# Patient Record
Sex: Female | Born: 1945
Health system: Southern US, Community
[De-identification: ages and names within clinical notes are randomized; demographics above are authoritative.]

## PROBLEM LIST (undated history)

## (undated) DIAGNOSIS — R9389 Abnormal findings on diagnostic imaging of other specified body structures: Secondary | ICD-10-CM

## (undated) DIAGNOSIS — G2581 Restless legs syndrome: Secondary | ICD-10-CM

## (undated) DIAGNOSIS — E1122 Type 2 diabetes mellitus with diabetic chronic kidney disease: Secondary | ICD-10-CM

## (undated) DIAGNOSIS — Z6841 Body Mass Index (BMI) 40.0 and over, adult: Secondary | ICD-10-CM

## (undated) DIAGNOSIS — N183 Chronic kidney disease, stage 3 unspecified: Secondary | ICD-10-CM

## (undated) DIAGNOSIS — J9611 Chronic respiratory failure with hypoxia: Secondary | ICD-10-CM

## (undated) DIAGNOSIS — R7989 Other specified abnormal findings of blood chemistry: Secondary | ICD-10-CM

## (undated) DIAGNOSIS — G8929 Other chronic pain: Secondary | ICD-10-CM

## (undated) DIAGNOSIS — J449 Chronic obstructive pulmonary disease, unspecified: Secondary | ICD-10-CM

## (undated) DIAGNOSIS — R251 Tremor, unspecified: Secondary | ICD-10-CM

## (undated) DIAGNOSIS — K389 Disease of appendix, unspecified: Secondary | ICD-10-CM

## (undated) DIAGNOSIS — Z9181 History of falling: Secondary | ICD-10-CM

## (undated) DIAGNOSIS — E785 Hyperlipidemia, unspecified: Secondary | ICD-10-CM

## (undated) DIAGNOSIS — E119 Type 2 diabetes mellitus without complications: Secondary | ICD-10-CM

## (undated) DIAGNOSIS — D72829 Elevated white blood cell count, unspecified: Secondary | ICD-10-CM

## (undated) DIAGNOSIS — E669 Obesity, unspecified: Secondary | ICD-10-CM

## (undated) DIAGNOSIS — R1900 Intra-abdominal and pelvic swelling, mass and lump, unspecified site: Secondary | ICD-10-CM

## (undated) DIAGNOSIS — G4733 Obstructive sleep apnea (adult) (pediatric): Secondary | ICD-10-CM

## (undated) DIAGNOSIS — N63 Unspecified lump in unspecified breast: Secondary | ICD-10-CM

## (undated) DIAGNOSIS — M109 Gout, unspecified: Secondary | ICD-10-CM

## (undated) DIAGNOSIS — I10 Essential (primary) hypertension: Secondary | ICD-10-CM

## (undated) HISTORY — DX: Unspecified lump in unspecified breast: N63.0

## (undated) HISTORY — PX: KNEE SURGERY: SHX244

## (undated) HISTORY — DX: Chronic respiratory failure with hypoxia: J96.11

## (undated) HISTORY — DX: Other chronic pain: G89.29

## (undated) HISTORY — DX: Tremor, unspecified: R25.1

## (undated) HISTORY — DX: Hyperlipidemia, unspecified: E78.5

## (undated) HISTORY — DX: History of falling: Z91.81

## (undated) HISTORY — PX: DILATION AND CURETTAGE, DIAGNOSTIC / THERAPEUTIC: SUR384

## (undated) HISTORY — DX: Elevated white blood cell count, unspecified: D72.829

## (undated) HISTORY — DX: Disease of appendix, unspecified: K38.9

## (undated) HISTORY — DX: Other specified abnormal findings of blood chemistry: R79.89

## (undated) HISTORY — DX: Chronic kidney disease, stage 3 unspecified: N18.30

## (undated) HISTORY — DX: Restless legs syndrome: G25.81

## (undated) HISTORY — DX: Chronic obstructive pulmonary disease, unspecified: J44.9

## (undated) HISTORY — DX: Morbid (severe) obesity due to excess calories: E66.01

## (undated) HISTORY — DX: Intra-abdominal and pelvic swelling, mass and lump, unspecified site: R19.00

## (undated) HISTORY — DX: Abnormal findings on diagnostic imaging of other specified body structures: R93.89

## (undated) HISTORY — DX: Obstructive sleep apnea (adult) (pediatric): G47.33

## (undated) HISTORY — DX: Chronic kidney disease, stage 3 (moderate): N18.3

## (undated) HISTORY — DX: Type 2 diabetes mellitus with diabetic chronic kidney disease: E11.22

## (undated) HISTORY — DX: Body Mass Index (BMI) 40.0 and over, adult: Z684

---
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
## 1977-06-16 HISTORY — PX: TUBAL LIGATION: SHX77

## 2001-05-11 ENCOUNTER — Other Ambulatory Visit: Admission: RE | Admit: 2001-05-11 | Discharge: 2001-05-11 | Payer: Self-pay | Admitting: Obstetrics and Gynecology

## 2001-05-21 ENCOUNTER — Ambulatory Visit (HOSPITAL_COMMUNITY): Admission: RE | Admit: 2001-05-21 | Discharge: 2001-05-21 | Payer: Self-pay | Admitting: Obstetrics and Gynecology

## 2001-06-10 ENCOUNTER — Other Ambulatory Visit: Admission: RE | Admit: 2001-06-10 | Discharge: 2001-06-10 | Payer: Self-pay | Admitting: Obstetrics and Gynecology

## 2001-11-01 ENCOUNTER — Encounter: Payer: Self-pay | Admitting: Oral Surgery

## 2001-11-02 ENCOUNTER — Ambulatory Visit (HOSPITAL_COMMUNITY): Admission: RE | Admit: 2001-11-02 | Discharge: 2001-11-02 | Payer: Self-pay | Admitting: Oral Surgery

## 2002-02-21 ENCOUNTER — Encounter: Payer: Self-pay | Admitting: Cardiology

## 2002-02-21 ENCOUNTER — Ambulatory Visit (HOSPITAL_COMMUNITY): Admission: RE | Admit: 2002-02-21 | Discharge: 2002-02-21 | Payer: Self-pay | Admitting: Cardiology

## 2002-04-22 ENCOUNTER — Other Ambulatory Visit: Admission: RE | Admit: 2002-04-22 | Discharge: 2002-04-22 | Payer: Self-pay | Admitting: Obstetrics and Gynecology

## 2002-07-01 ENCOUNTER — Ambulatory Visit (HOSPITAL_COMMUNITY): Admission: RE | Admit: 2002-07-01 | Discharge: 2002-07-01 | Payer: Self-pay | Admitting: Obstetrics and Gynecology

## 2003-08-21 ENCOUNTER — Ambulatory Visit (HOSPITAL_COMMUNITY): Admission: RE | Admit: 2003-08-21 | Discharge: 2003-08-21 | Payer: Self-pay | Admitting: Cardiology

## 2003-09-01 ENCOUNTER — Ambulatory Visit (HOSPITAL_COMMUNITY): Admission: RE | Admit: 2003-09-01 | Discharge: 2003-09-01 | Payer: Self-pay | Admitting: Gastroenterology

## 2004-01-22 ENCOUNTER — Encounter: Admission: RE | Admit: 2004-01-22 | Discharge: 2004-01-22 | Payer: Self-pay | Admitting: Cardiology

## 2004-07-03 ENCOUNTER — Encounter: Admission: RE | Admit: 2004-07-03 | Discharge: 2004-07-03 | Payer: Self-pay | Admitting: Cardiology

## 2005-04-06 ENCOUNTER — Emergency Department (HOSPITAL_COMMUNITY): Admission: EM | Admit: 2005-04-06 | Discharge: 2005-04-06 | Payer: Self-pay | Admitting: Family Medicine

## 2007-06-07 ENCOUNTER — Encounter: Admission: RE | Admit: 2007-06-07 | Discharge: 2007-06-07 | Payer: Self-pay | Admitting: Cardiology

## 2008-08-08 ENCOUNTER — Encounter (INDEPENDENT_AMBULATORY_CARE_PROVIDER_SITE_OTHER): Payer: Self-pay | Admitting: *Deleted

## 2010-07-07 ENCOUNTER — Encounter: Payer: Self-pay | Admitting: Cardiology

## 2010-10-18 ENCOUNTER — Ambulatory Visit (HOSPITAL_BASED_OUTPATIENT_CLINIC_OR_DEPARTMENT_OTHER): Payer: Medicare Other | Attending: Cardiology

## 2010-10-18 DIAGNOSIS — G4733 Obstructive sleep apnea (adult) (pediatric): Secondary | ICD-10-CM | POA: Insufficient documentation

## 2010-10-19 DIAGNOSIS — G4733 Obstructive sleep apnea (adult) (pediatric): Secondary | ICD-10-CM

## 2010-10-21 NOTE — Procedures (Signed)
NAME:  Kimberly Howell, Kimberly Howell               ACCOUNT NO.:  [id number]  MEDICAL RECORD NO.:  [id number]          PATIENT TYPE:  OUT  LOCATION:  SLEEP CENTER                 FACILITY:  [facility]  PHYSICIAN:  Emma Schupp D. Annamaria Boots, MD, FCCP, [place] OF BIRTH:  1945-07-26  DATE OF STUDY:  10/18/2010                           NOCTURNAL POLYSOMNOGRAM  REFERRING PHYSICIAN:  Ardyth Gal. Spruill, M.D.  INDICATION FOR STUDY:  Hypersomnia with sleep apnea.  EPWORTH SLEEPINESS SCORE:  15/24.  BMI 56.7.  Weight 320 pounds.  Height 63 inches.  Neck 17 inches.  MEDICATIONS:  Home medications are charted and reviewed.  SLEEP ARCHITECTURE:  Total sleep time 137.5 minutes with sleep efficiency 35.2%.  Stage I was 18.5%, stage II 59.3%, stage III absent, REM 22.2% of total sleep time.  Sleep latency 129.5 minutes, REM latency 102 minutes, awake after sleep onset 111.5 minutes, arousal index 59.8.  BEDTIME MEDICATION:  None.  RESPIRATORY DATA:  Apnea/hypopnea index (AHI) 89.5 per hour.  A total of 205 events was scored including 177 obstructive apneas, 23 mixed apneas, 5 hypopneas.  Events were more common while non-supine.  REM AHI 92.5 per hour.  Because of delayed sleep onset, she could not meet protocol requirements for application of CPAP titration by split protocol on this study night.  OXYGEN DATA:  Moderate snoring with oxygen desaturation to a nadir of 76% and a mean oxygen saturation through the study of 93.1% on room air.  CARDIAC DATA:  Normal sinus rhythm.  MOVEMENT-PARASOMNIA:  No significant movement disturbance.  Bathroom x1.  IMPRESSIONS-RECOMMENDATIONS: 1. Sleep architecture was significant for delayed sleep onset until     nearly 1 a.m. and then spontaneously waking, unable to regain sleep     after 3:30 a.m. 2. Severe obstructive sleep apnea/hypopnea syndrome, AHI 89.5 per     hour.  Events were more common while non-supine, but seen in all     sleep positions.  Moderate snoring with oxygen  desaturation to a     nadir of 76% and a mean oxygen saturation through the study of     93.1% on room air. 3. Because of the delayed sleep onset, she did not meet the protocol     requirements for initiation of CPAP titration     by split protocol on this study night.  Recommend return for     dedicated CPAP titration study or evaluate for alternative     management as clinically appropriate.     Keldan Eplin D. Annamaria Boots, MD, [facility], FACP Diplomate, [employer] adviser of Sleep Medicine Electronically Signed    CDY/MEDQ  D:  10/19/2010 12:28:11  T:  10/20/2010 02:23:10  Job:  [id number]

## 2012-10-26 ENCOUNTER — Other Ambulatory Visit: Payer: Self-pay | Admitting: Cardiology

## 2012-10-26 DIAGNOSIS — Z1231 Encounter for screening mammogram for malignant neoplasm of breast: Secondary | ICD-10-CM

## 2012-11-30 ENCOUNTER — Ambulatory Visit: Payer: Medicare Other

## 2012-12-06 ENCOUNTER — Ambulatory Visit
Admission: RE | Admit: 2012-12-06 | Discharge: 2012-12-06 | Disposition: A | Discharge status: Home / Self Care | Payer: Medicare Other | Source: Ambulatory Visit | Attending: Cardiology | Admitting: Cardiology

## 2012-12-06 DIAGNOSIS — Z1231 Encounter for screening mammogram for malignant neoplasm of breast: Secondary | ICD-10-CM

## 2012-12-08 ENCOUNTER — Other Ambulatory Visit: Payer: Self-pay | Admitting: Cardiology

## 2012-12-08 DIAGNOSIS — R928 Other abnormal and inconclusive findings on diagnostic imaging of breast: Secondary | ICD-10-CM

## 2012-12-21 ENCOUNTER — Ambulatory Visit
Admission: RE | Admit: 2012-12-21 | Discharge: 2012-12-21 | Disposition: A | Discharge status: Home / Self Care | Payer: Medicare Other | Source: Ambulatory Visit | Attending: Cardiology | Admitting: Cardiology

## 2012-12-21 ENCOUNTER — Other Ambulatory Visit: Payer: Self-pay | Admitting: Cardiology

## 2012-12-21 DIAGNOSIS — R928 Other abnormal and inconclusive findings on diagnostic imaging of breast: Secondary | ICD-10-CM

## 2012-12-21 DIAGNOSIS — N6320 Unspecified lump in the left breast, unspecified quadrant: Secondary | ICD-10-CM

## 2013-01-03 ENCOUNTER — Other Ambulatory Visit: Payer: Medicare Other

## 2013-01-10 ENCOUNTER — Other Ambulatory Visit: Payer: Medicare Other

## 2013-01-11 ENCOUNTER — Ambulatory Visit
Admission: RE | Admit: 2013-01-11 | Discharge: 2013-01-11 | Disposition: A | Discharge status: Home / Self Care | Payer: Medicare Other | Source: Ambulatory Visit | Attending: Cardiology | Admitting: Cardiology

## 2013-01-11 ENCOUNTER — Other Ambulatory Visit: Payer: Self-pay | Admitting: Cardiology

## 2013-01-11 DIAGNOSIS — N6320 Unspecified lump in the left breast, unspecified quadrant: Secondary | ICD-10-CM

## 2013-06-30 ENCOUNTER — Encounter: Payer: Self-pay | Admitting: Gastroenterology

## 2014-01-06 ENCOUNTER — Encounter: Payer: Self-pay | Admitting: Gastroenterology

## 2014-02-27 ENCOUNTER — Other Ambulatory Visit: Payer: Self-pay

## 2014-02-27 DIAGNOSIS — Z1231 Encounter for screening mammogram for malignant neoplasm of breast: Secondary | ICD-10-CM

## 2014-03-13 ENCOUNTER — Ambulatory Visit: Payer: Medicare Other

## 2014-03-20 ENCOUNTER — Ambulatory Visit: Payer: Medicare Other

## 2014-04-04 ENCOUNTER — Encounter: Payer: Self-pay | Admitting: Gastroenterology

## 2014-04-17 ENCOUNTER — Ambulatory Visit: Payer: Medicare Other

## 2014-04-19 ENCOUNTER — Other Ambulatory Visit: Payer: Self-pay

## 2014-04-19 ENCOUNTER — Other Ambulatory Visit: Payer: Self-pay | Admitting: Cardiology

## 2014-04-19 ENCOUNTER — Ambulatory Visit
Admission: RE | Admit: 2014-04-19 | Discharge: 2014-04-19 | Disposition: A | Discharge status: Home / Self Care | Payer: Commercial Managed Care - HMO | Source: Ambulatory Visit

## 2014-04-19 DIAGNOSIS — Z1231 Encounter for screening mammogram for malignant neoplasm of breast: Secondary | ICD-10-CM

## 2014-10-23 ENCOUNTER — Other Ambulatory Visit: Payer: Self-pay | Admitting: Cardiology

## 2014-10-23 DIAGNOSIS — R51 Headache: Secondary | ICD-10-CM

## 2014-10-23 DIAGNOSIS — R42 Dizziness and giddiness: Secondary | ICD-10-CM

## 2014-10-23 DIAGNOSIS — R519 Headache, unspecified: Secondary | ICD-10-CM

## 2014-10-30 ENCOUNTER — Ambulatory Visit
Admission: RE | Admit: 2014-10-30 | Discharge: 2014-10-30 | Disposition: A | Discharge status: Home / Self Care | Payer: Commercial Managed Care - HMO | Source: Ambulatory Visit | Attending: Cardiology | Admitting: Cardiology

## 2014-10-30 ENCOUNTER — Other Ambulatory Visit: Payer: Self-pay | Admitting: Cardiology

## 2014-10-30 DIAGNOSIS — R42 Dizziness and giddiness: Secondary | ICD-10-CM

## 2014-10-30 DIAGNOSIS — R519 Headache, unspecified: Secondary | ICD-10-CM

## 2014-10-30 DIAGNOSIS — M25512 Pain in left shoulder: Secondary | ICD-10-CM

## 2014-10-30 DIAGNOSIS — R51 Headache: Secondary | ICD-10-CM

## 2016-04-04 ENCOUNTER — Ambulatory Visit (INDEPENDENT_AMBULATORY_CARE_PROVIDER_SITE_OTHER): Payer: Medicare HMO | Admitting: Orthopaedic Surgery

## 2016-04-04 DIAGNOSIS — M25511 Pain in right shoulder: Secondary | ICD-10-CM

## 2016-04-04 DIAGNOSIS — M25512 Pain in left shoulder: Secondary | ICD-10-CM | POA: Diagnosis not present

## 2016-04-25 ENCOUNTER — Telehealth (INDEPENDENT_AMBULATORY_CARE_PROVIDER_SITE_OTHER): Payer: Self-pay | Admitting: Orthopaedic Surgery

## 2016-04-25 NOTE — Telephone Encounter (Signed)
Patient called about a brace for her wrist. Please call patient back at work number [phone] and ask for Mrs. A.

## 2016-04-29 ENCOUNTER — Telehealth (INDEPENDENT_AMBULATORY_CARE_PROVIDER_SITE_OTHER): Payer: Self-pay | Admitting: Orthopaedic Surgery

## 2016-04-29 NOTE — Telephone Encounter (Signed)
Left VM for pt. SHe can go to [employer] for a better splint to help with her CPT Left wrist.

## 2016-04-29 NOTE — Telephone Encounter (Signed)
Patient called about brace for left wrist, she was told to call if she felt as though she needed it. Patient called last week and has not heard anything yet. Please call patient.

## 2016-04-29 NOTE — Telephone Encounter (Signed)
Patient would like the same brace for her left wrist that she has for her right wrist, patient states she received that brace here in the office

## 2016-05-01 NOTE — Telephone Encounter (Signed)
Pt came by and picked up her brace for the left hand.

## 2016-05-07 ENCOUNTER — Other Ambulatory Visit: Payer: Self-pay | Admitting: Nephrology

## 2016-05-07 DIAGNOSIS — E118 Type 2 diabetes mellitus with unspecified complications: Secondary | ICD-10-CM

## 2016-05-07 DIAGNOSIS — N183 Chronic kidney disease, stage 3 unspecified: Secondary | ICD-10-CM

## 2016-05-07 DIAGNOSIS — I159 Secondary hypertension, unspecified: Secondary | ICD-10-CM

## 2016-05-07 DIAGNOSIS — N179 Acute kidney failure, unspecified: Secondary | ICD-10-CM

## 2016-05-12 ENCOUNTER — Other Ambulatory Visit: Payer: Self-pay | Admitting: Cardiology

## 2016-05-12 ENCOUNTER — Other Ambulatory Visit: Payer: Self-pay | Admitting: Nephrology

## 2016-05-12 DIAGNOSIS — I159 Secondary hypertension, unspecified: Secondary | ICD-10-CM

## 2016-05-12 DIAGNOSIS — N183 Chronic kidney disease, stage 3 unspecified: Secondary | ICD-10-CM

## 2016-05-12 DIAGNOSIS — N179 Acute kidney failure, unspecified: Secondary | ICD-10-CM

## 2016-05-12 DIAGNOSIS — E118 Type 2 diabetes mellitus with unspecified complications: Secondary | ICD-10-CM

## 2016-05-12 DIAGNOSIS — Z1231 Encounter for screening mammogram for malignant neoplasm of breast: Secondary | ICD-10-CM

## 2016-05-19 ENCOUNTER — Ambulatory Visit
Admission: RE | Admit: 2016-05-19 | Discharge: 2016-05-19 | Disposition: A | Discharge status: Home / Self Care | Payer: Medicare HMO | Source: Ambulatory Visit | Attending: Cardiology | Admitting: Cardiology

## 2016-05-19 ENCOUNTER — Other Ambulatory Visit: Payer: Commercial Managed Care - HMO

## 2016-05-19 ENCOUNTER — Ambulatory Visit
Admission: RE | Admit: 2016-05-19 | Discharge: 2016-05-19 | Disposition: A | Discharge status: Home / Self Care | Payer: Medicare HMO | Source: Ambulatory Visit | Attending: Nephrology | Admitting: Nephrology

## 2016-05-19 DIAGNOSIS — N179 Acute kidney failure, unspecified: Secondary | ICD-10-CM

## 2016-05-19 DIAGNOSIS — E118 Type 2 diabetes mellitus with unspecified complications: Secondary | ICD-10-CM

## 2016-05-19 DIAGNOSIS — Z1231 Encounter for screening mammogram for malignant neoplasm of breast: Secondary | ICD-10-CM

## 2016-05-19 DIAGNOSIS — I159 Secondary hypertension, unspecified: Secondary | ICD-10-CM

## 2016-05-19 DIAGNOSIS — N183 Chronic kidney disease, stage 3 unspecified: Secondary | ICD-10-CM

## 2016-05-27 ENCOUNTER — Telehealth (INDEPENDENT_AMBULATORY_CARE_PROVIDER_SITE_OTHER): Payer: Self-pay | Admitting: Orthopaedic Surgery

## 2016-05-27 NOTE — Telephone Encounter (Signed)
Needs return to [facility] not seen in 2 months

## 2016-05-27 NOTE — Telephone Encounter (Signed)
Please advise 

## 2016-05-27 NOTE — Telephone Encounter (Signed)
Patient states the tylenol is no longer helping her shoulder pain. Patient is requesting something stronger for the pain. Please call patient

## 2016-05-29 NOTE — Telephone Encounter (Signed)
LM at work to make appt  See note below by PW

## 2016-11-17 DIAGNOSIS — E7800 Pure hypercholesterolemia, unspecified: Secondary | ICD-10-CM | POA: Diagnosis not present

## 2016-11-17 DIAGNOSIS — I10 Essential (primary) hypertension: Secondary | ICD-10-CM | POA: Diagnosis not present

## 2016-11-17 DIAGNOSIS — K029 Dental caries, unspecified: Secondary | ICD-10-CM | POA: Diagnosis not present

## 2016-11-17 DIAGNOSIS — E0822 Diabetes mellitus due to underlying condition with diabetic chronic kidney disease: Secondary | ICD-10-CM | POA: Diagnosis not present

## 2016-11-17 DIAGNOSIS — M109 Gout, unspecified: Secondary | ICD-10-CM | POA: Diagnosis not present

## 2016-11-17 DIAGNOSIS — E1165 Type 2 diabetes mellitus with hyperglycemia: Secondary | ICD-10-CM | POA: Diagnosis not present

## 2016-11-17 DIAGNOSIS — N183 Chronic kidney disease, stage 3 (moderate): Secondary | ICD-10-CM | POA: Diagnosis not present

## 2017-01-29 DIAGNOSIS — N183 Chronic kidney disease, stage 3 (moderate): Secondary | ICD-10-CM | POA: Diagnosis not present

## 2017-01-29 DIAGNOSIS — I10 Essential (primary) hypertension: Secondary | ICD-10-CM | POA: Diagnosis not present

## 2017-01-29 DIAGNOSIS — E1122 Type 2 diabetes mellitus with diabetic chronic kidney disease: Secondary | ICD-10-CM | POA: Diagnosis not present

## 2017-02-23 DIAGNOSIS — G894 Chronic pain syndrome: Secondary | ICD-10-CM | POA: Diagnosis not present

## 2017-02-23 DIAGNOSIS — E0822 Diabetes mellitus due to underlying condition with diabetic chronic kidney disease: Secondary | ICD-10-CM | POA: Diagnosis not present

## 2017-03-09 DIAGNOSIS — N183 Chronic kidney disease, stage 3 (moderate): Secondary | ICD-10-CM | POA: Diagnosis not present

## 2017-03-09 DIAGNOSIS — E119 Type 2 diabetes mellitus without complications: Secondary | ICD-10-CM | POA: Diagnosis not present

## 2017-03-09 DIAGNOSIS — I10 Essential (primary) hypertension: Secondary | ICD-10-CM | POA: Diagnosis not present

## 2017-03-09 DIAGNOSIS — M109 Gout, unspecified: Secondary | ICD-10-CM | POA: Diagnosis not present

## 2017-03-09 DIAGNOSIS — E669 Obesity, unspecified: Secondary | ICD-10-CM | POA: Diagnosis not present

## 2017-07-13 DIAGNOSIS — E7800 Pure hypercholesterolemia, unspecified: Secondary | ICD-10-CM | POA: Diagnosis not present

## 2017-07-13 DIAGNOSIS — E0822 Diabetes mellitus due to underlying condition with diabetic chronic kidney disease: Secondary | ICD-10-CM | POA: Diagnosis not present

## 2017-07-13 DIAGNOSIS — N183 Chronic kidney disease, stage 3 (moderate): Secondary | ICD-10-CM | POA: Diagnosis not present

## 2017-07-13 DIAGNOSIS — G894 Chronic pain syndrome: Secondary | ICD-10-CM | POA: Diagnosis not present

## 2017-07-13 DIAGNOSIS — R200 Anesthesia of skin: Secondary | ICD-10-CM | POA: Diagnosis not present

## 2017-07-13 DIAGNOSIS — I10 Essential (primary) hypertension: Secondary | ICD-10-CM | POA: Diagnosis not present

## 2017-08-03 DIAGNOSIS — E119 Type 2 diabetes mellitus without complications: Secondary | ICD-10-CM | POA: Diagnosis not present

## 2017-08-10 DIAGNOSIS — I129 Hypertensive chronic kidney disease with stage 1 through stage 4 chronic kidney disease, or unspecified chronic kidney disease: Secondary | ICD-10-CM | POA: Diagnosis not present

## 2017-08-10 DIAGNOSIS — M109 Gout, unspecified: Secondary | ICD-10-CM | POA: Diagnosis not present

## 2017-08-10 DIAGNOSIS — E669 Obesity, unspecified: Secondary | ICD-10-CM | POA: Diagnosis not present

## 2017-08-10 DIAGNOSIS — E1122 Type 2 diabetes mellitus with diabetic chronic kidney disease: Secondary | ICD-10-CM | POA: Diagnosis not present

## 2017-08-10 DIAGNOSIS — N183 Chronic kidney disease, stage 3 (moderate): Secondary | ICD-10-CM | POA: Diagnosis not present

## 2017-08-26 ENCOUNTER — Other Ambulatory Visit: Payer: Self-pay | Admitting: Internal Medicine

## 2017-08-26 ENCOUNTER — Ambulatory Visit
Admission: RE | Admit: 2017-08-26 | Discharge: 2017-08-26 | Disposition: A | Discharge status: Home / Self Care | Payer: Medicare HMO | Source: Ambulatory Visit | Attending: Internal Medicine | Admitting: Internal Medicine

## 2017-08-26 DIAGNOSIS — R040 Epistaxis: Secondary | ICD-10-CM | POA: Diagnosis not present

## 2017-08-26 DIAGNOSIS — M25561 Pain in right knee: Secondary | ICD-10-CM

## 2017-08-26 DIAGNOSIS — S52021A Displaced fracture of olecranon process without intraarticular extension of right ulna, initial encounter for closed fracture: Secondary | ICD-10-CM | POA: Diagnosis not present

## 2017-08-26 DIAGNOSIS — S8991XA Unspecified injury of right lower leg, initial encounter: Secondary | ICD-10-CM | POA: Diagnosis not present

## 2017-08-26 DIAGNOSIS — M25521 Pain in right elbow: Secondary | ICD-10-CM | POA: Diagnosis not present

## 2017-08-27 DIAGNOSIS — M25521 Pain in right elbow: Secondary | ICD-10-CM | POA: Diagnosis not present

## 2017-08-28 DIAGNOSIS — M25521 Pain in right elbow: Secondary | ICD-10-CM | POA: Diagnosis not present

## 2017-09-03 DIAGNOSIS — M25521 Pain in right elbow: Secondary | ICD-10-CM | POA: Diagnosis not present

## 2017-09-03 DIAGNOSIS — M79644 Pain in right finger(s): Secondary | ICD-10-CM | POA: Diagnosis not present

## 2017-09-15 DIAGNOSIS — M25521 Pain in right elbow: Secondary | ICD-10-CM | POA: Diagnosis not present

## 2017-10-05 DIAGNOSIS — M5442 Lumbago with sciatica, left side: Secondary | ICD-10-CM | POA: Diagnosis not present

## 2017-10-05 DIAGNOSIS — R252 Cramp and spasm: Secondary | ICD-10-CM | POA: Diagnosis not present

## 2017-10-13 DIAGNOSIS — M25521 Pain in right elbow: Secondary | ICD-10-CM | POA: Diagnosis not present

## 2017-10-28 DIAGNOSIS — M5442 Lumbago with sciatica, left side: Secondary | ICD-10-CM | POA: Diagnosis not present

## 2017-10-28 DIAGNOSIS — R252 Cramp and spasm: Secondary | ICD-10-CM | POA: Diagnosis not present

## 2017-11-03 DIAGNOSIS — G4733 Obstructive sleep apnea (adult) (pediatric): Secondary | ICD-10-CM | POA: Diagnosis not present

## 2017-11-03 DIAGNOSIS — M5442 Lumbago with sciatica, left side: Secondary | ICD-10-CM | POA: Diagnosis not present

## 2017-11-03 DIAGNOSIS — R252 Cramp and spasm: Secondary | ICD-10-CM | POA: Diagnosis not present

## 2017-12-04 DIAGNOSIS — G4733 Obstructive sleep apnea (adult) (pediatric): Secondary | ICD-10-CM | POA: Diagnosis not present

## 2017-12-04 DIAGNOSIS — M5442 Lumbago with sciatica, left side: Secondary | ICD-10-CM | POA: Diagnosis not present

## 2017-12-04 DIAGNOSIS — R252 Cramp and spasm: Secondary | ICD-10-CM | POA: Diagnosis not present

## 2018-01-03 DIAGNOSIS — G4733 Obstructive sleep apnea (adult) (pediatric): Secondary | ICD-10-CM | POA: Diagnosis not present

## 2018-01-03 DIAGNOSIS — M5442 Lumbago with sciatica, left side: Secondary | ICD-10-CM | POA: Diagnosis not present

## 2018-01-03 DIAGNOSIS — R252 Cramp and spasm: Secondary | ICD-10-CM | POA: Diagnosis not present

## 2018-01-11 DIAGNOSIS — G4730 Sleep apnea, unspecified: Secondary | ICD-10-CM | POA: Diagnosis not present

## 2018-01-11 DIAGNOSIS — E7800 Pure hypercholesterolemia, unspecified: Secondary | ICD-10-CM | POA: Diagnosis not present

## 2018-01-11 DIAGNOSIS — Z6841 Body Mass Index (BMI) 40.0 and over, adult: Secondary | ICD-10-CM | POA: Diagnosis not present

## 2018-01-11 DIAGNOSIS — Z1211 Encounter for screening for malignant neoplasm of colon: Secondary | ICD-10-CM | POA: Diagnosis not present

## 2018-01-11 DIAGNOSIS — I10 Essential (primary) hypertension: Secondary | ICD-10-CM | POA: Diagnosis not present

## 2018-01-11 DIAGNOSIS — N183 Chronic kidney disease, stage 3 (moderate): Secondary | ICD-10-CM | POA: Diagnosis not present

## 2018-01-11 DIAGNOSIS — E1122 Type 2 diabetes mellitus with diabetic chronic kidney disease: Secondary | ICD-10-CM | POA: Diagnosis not present

## 2018-01-12 DIAGNOSIS — Z1211 Encounter for screening for malignant neoplasm of colon: Secondary | ICD-10-CM | POA: Diagnosis not present

## 2018-02-03 DIAGNOSIS — R252 Cramp and spasm: Secondary | ICD-10-CM | POA: Diagnosis not present

## 2018-02-03 DIAGNOSIS — M5442 Lumbago with sciatica, left side: Secondary | ICD-10-CM | POA: Diagnosis not present

## 2018-02-03 DIAGNOSIS — G4733 Obstructive sleep apnea (adult) (pediatric): Secondary | ICD-10-CM | POA: Diagnosis not present

## 2018-02-22 DIAGNOSIS — I129 Hypertensive chronic kidney disease with stage 1 through stage 4 chronic kidney disease, or unspecified chronic kidney disease: Secondary | ICD-10-CM | POA: Diagnosis not present

## 2018-02-22 DIAGNOSIS — M109 Gout, unspecified: Secondary | ICD-10-CM | POA: Diagnosis not present

## 2018-02-22 DIAGNOSIS — E119 Type 2 diabetes mellitus without complications: Secondary | ICD-10-CM | POA: Diagnosis not present

## 2018-02-22 DIAGNOSIS — E1122 Type 2 diabetes mellitus with diabetic chronic kidney disease: Secondary | ICD-10-CM | POA: Diagnosis not present

## 2018-02-22 DIAGNOSIS — N183 Chronic kidney disease, stage 3 (moderate): Secondary | ICD-10-CM | POA: Diagnosis not present

## 2018-02-22 DIAGNOSIS — E669 Obesity, unspecified: Secondary | ICD-10-CM | POA: Diagnosis not present

## 2018-03-04 DIAGNOSIS — R05 Cough: Secondary | ICD-10-CM | POA: Diagnosis not present

## 2018-03-04 DIAGNOSIS — J209 Acute bronchitis, unspecified: Secondary | ICD-10-CM | POA: Diagnosis not present

## 2018-03-04 DIAGNOSIS — R0600 Dyspnea, unspecified: Secondary | ICD-10-CM | POA: Diagnosis not present

## 2018-03-06 DIAGNOSIS — R0609 Other forms of dyspnea: Secondary | ICD-10-CM | POA: Diagnosis not present

## 2018-03-06 DIAGNOSIS — G4733 Obstructive sleep apnea (adult) (pediatric): Secondary | ICD-10-CM | POA: Diagnosis not present

## 2018-03-06 DIAGNOSIS — M5442 Lumbago with sciatica, left side: Secondary | ICD-10-CM | POA: Diagnosis not present

## 2018-03-06 DIAGNOSIS — R252 Cramp and spasm: Secondary | ICD-10-CM | POA: Diagnosis not present

## 2018-03-06 DIAGNOSIS — R0601 Orthopnea: Secondary | ICD-10-CM | POA: Diagnosis not present

## 2018-03-06 DIAGNOSIS — J449 Chronic obstructive pulmonary disease, unspecified: Secondary | ICD-10-CM | POA: Diagnosis not present

## 2018-03-10 ENCOUNTER — Other Ambulatory Visit: Payer: Self-pay | Admitting: Internal Medicine

## 2018-03-10 ENCOUNTER — Other Ambulatory Visit: Payer: Self-pay

## 2018-03-10 ENCOUNTER — Ambulatory Visit
Admission: RE | Admit: 2018-03-10 | Discharge: 2018-03-10 | Disposition: A | Discharge status: Home / Self Care | Payer: Medicare HMO | Source: Ambulatory Visit | Attending: Internal Medicine | Admitting: Internal Medicine

## 2018-03-10 ENCOUNTER — Observation Stay (HOSPITAL_COMMUNITY)
Admission: EM | Admit: 2018-03-10 | Discharge: 2018-03-11 | Disposition: A | Discharge status: Home / Self Care | Payer: Medicare HMO | Attending: Internal Medicine | Admitting: Internal Medicine

## 2018-03-10 ENCOUNTER — Encounter (HOSPITAL_COMMUNITY): Payer: Self-pay

## 2018-03-10 DIAGNOSIS — R05 Cough: Secondary | ICD-10-CM | POA: Diagnosis not present

## 2018-03-10 DIAGNOSIS — N189 Chronic kidney disease, unspecified: Secondary | ICD-10-CM

## 2018-03-10 DIAGNOSIS — E1122 Type 2 diabetes mellitus with diabetic chronic kidney disease: Secondary | ICD-10-CM | POA: Insufficient documentation

## 2018-03-10 DIAGNOSIS — J9601 Acute respiratory failure with hypoxia: Secondary | ICD-10-CM | POA: Diagnosis not present

## 2018-03-10 DIAGNOSIS — D72829 Elevated white blood cell count, unspecified: Secondary | ICD-10-CM | POA: Insufficient documentation

## 2018-03-10 DIAGNOSIS — R7989 Other specified abnormal findings of blood chemistry: Secondary | ICD-10-CM

## 2018-03-10 DIAGNOSIS — M546 Pain in thoracic spine: Secondary | ICD-10-CM

## 2018-03-10 DIAGNOSIS — N179 Acute kidney failure, unspecified: Secondary | ICD-10-CM | POA: Diagnosis not present

## 2018-03-10 DIAGNOSIS — R0600 Dyspnea, unspecified: Secondary | ICD-10-CM | POA: Diagnosis not present

## 2018-03-10 DIAGNOSIS — M549 Dorsalgia, unspecified: Secondary | ICD-10-CM | POA: Insufficient documentation

## 2018-03-10 DIAGNOSIS — Z9981 Dependence on supplemental oxygen: Secondary | ICD-10-CM | POA: Diagnosis not present

## 2018-03-10 DIAGNOSIS — M109 Gout, unspecified: Secondary | ICD-10-CM | POA: Diagnosis not present

## 2018-03-10 DIAGNOSIS — R0601 Orthopnea: Secondary | ICD-10-CM | POA: Diagnosis not present

## 2018-03-10 DIAGNOSIS — E1169 Type 2 diabetes mellitus with other specified complication: Secondary | ICD-10-CM | POA: Diagnosis not present

## 2018-03-10 DIAGNOSIS — Z6841 Body Mass Index (BMI) 40.0 and over, adult: Secondary | ICD-10-CM | POA: Diagnosis not present

## 2018-03-10 DIAGNOSIS — Z7984 Long term (current) use of oral hypoglycemic drugs: Secondary | ICD-10-CM | POA: Insufficient documentation

## 2018-03-10 DIAGNOSIS — Z79899 Other long term (current) drug therapy: Secondary | ICD-10-CM | POA: Diagnosis not present

## 2018-03-10 DIAGNOSIS — J9621 Acute and chronic respiratory failure with hypoxia: Secondary | ICD-10-CM | POA: Diagnosis not present

## 2018-03-10 DIAGNOSIS — I129 Hypertensive chronic kidney disease with stage 1 through stage 4 chronic kidney disease, or unspecified chronic kidney disease: Secondary | ICD-10-CM | POA: Insufficient documentation

## 2018-03-10 DIAGNOSIS — Z7982 Long term (current) use of aspirin: Secondary | ICD-10-CM | POA: Insufficient documentation

## 2018-03-10 DIAGNOSIS — R0602 Shortness of breath: Secondary | ICD-10-CM

## 2018-03-10 DIAGNOSIS — E785 Hyperlipidemia, unspecified: Secondary | ICD-10-CM | POA: Diagnosis not present

## 2018-03-10 DIAGNOSIS — N183 Chronic kidney disease, stage 3 (moderate): Secondary | ICD-10-CM | POA: Diagnosis not present

## 2018-03-10 DIAGNOSIS — R791 Abnormal coagulation profile: Secondary | ICD-10-CM | POA: Diagnosis not present

## 2018-03-10 HISTORY — DX: Type 2 diabetes mellitus without complications: E11.9

## 2018-03-10 HISTORY — DX: Gout, unspecified: M10.9

## 2018-03-10 HISTORY — DX: Essential (primary) hypertension: I10

## 2018-03-10 LAB — PROTIME-INR
INR: 1.05
Prothrombin Time: 13.6 seconds (ref 11.4–15.2)

## 2018-03-10 LAB — BASIC METABOLIC PANEL
ANION GAP: 10 (ref 5–15)
BUN: 44 mg/dL — ABNORMAL HIGH (ref 8–23)
CALCIUM: 9.1 mg/dL (ref 8.9–10.3)
CO2: 28 mmol/L (ref 22–32)
CREATININE: 2.73 mg/dL — AB (ref 0.44–1.00)
Chloride: 102 mmol/L (ref 98–111)
GFR calc non Af Amer: 17 mL/min — ABNORMAL LOW (ref >60)
GFR, EST AFRICAN AMERICAN: 19 mL/min — AB (ref >60)
GLUCOSE: 135 mg/dL — AB (ref 70–99)
Potassium: 4.9 mmol/L (ref 3.5–5.1)
SODIUM: 140 mmol/L (ref 135–145)

## 2018-03-10 LAB — CBC WITH DIFFERENTIAL/PLATELET
BASOS ABS: 0 10*3/uL (ref 0.0–0.1)
BASOS PCT: 0 %
EOS ABS: 0.2 10*3/uL (ref 0.0–0.7)
EOS PCT: 2 %
HEMATOCRIT: 39 % (ref 36.0–46.0)
HEMOGLOBIN: 11.9 g/dL — AB (ref 12.0–15.0)
Lymphocytes Relative: 14 %
Lymphs Abs: 1.5 10*3/uL (ref 0.7–4.0)
MCH: 29.5 pg (ref 26.0–34.0)
MCHC: 30.5 g/dL (ref 30.0–36.0)
MCV: 96.8 fL (ref 78.0–100.0)
MONO ABS: 0.5 10*3/uL (ref 0.1–1.0)
Monocytes Relative: 5 %
Neutro Abs: 8.8 10*3/uL — ABNORMAL HIGH (ref 1.7–7.7)
Neutrophils Relative %: 79 %
Platelets: 243 10*3/uL (ref 150–400)
RBC: 4.03 MIL/uL (ref 3.87–5.11)
RDW: 13.6 % (ref 11.5–15.5)
WBC: 11.1 10*3/uL — AB (ref 4.0–10.5)

## 2018-03-10 LAB — TROPONIN I

## 2018-03-10 LAB — MAGNESIUM: MAGNESIUM: 2.3 mg/dL (ref 1.7–2.4)

## 2018-03-10 LAB — APTT: APTT: 33 s (ref 24–36)

## 2018-03-10 MED ORDER — SODIUM CHLORIDE 0.9% FLUSH
3.0000 mL | Freq: Two times a day (BID) | INTRAVENOUS | Status: DC
  Administered 2018-03-11: 3 mL via INTRAVENOUS

## 2018-03-10 MED ORDER — PRAVASTATIN SODIUM 40 MG PO TABS
40.0000 mg | ORAL_TABLET | Freq: Every day | ORAL | Status: DC
  Filled 2018-03-10: qty 1

## 2018-03-10 MED ORDER — ONDANSETRON HCL 4 MG PO TABS: 4.0000 mg | ORAL_TABLET | Freq: Four times a day (QID) | ORAL | Status: DC | PRN

## 2018-03-10 MED ORDER — INSULIN ASPART 100 UNIT/ML ~~LOC~~ SOLN: 0.0000 [IU] | Freq: Every day | SUBCUTANEOUS | Status: DC

## 2018-03-10 MED ORDER — HEPARIN BOLUS VIA INFUSION
5000.0000 [IU] | Freq: Once | INTRAVENOUS | Status: AC
  Administered 2018-03-10: 5000 [IU] via INTRAVENOUS
  Filled 2018-03-10: qty 5000

## 2018-03-10 MED ORDER — ALBUTEROL SULFATE (2.5 MG/3ML) 0.083% IN NEBU: 2.5000 mg | INHALATION_SOLUTION | RESPIRATORY_TRACT | Status: DC | PRN

## 2018-03-10 MED ORDER — SODIUM CHLORIDE 0.9 % IV SOLN
INTRAVENOUS | Status: DC
  Administered 2018-03-11: 07:00:00 via INTRAVENOUS

## 2018-03-10 MED ORDER — HEPARIN (PORCINE) IN NACL 100-0.45 UNIT/ML-% IJ SOLN
1400.0000 [IU]/h | INTRAMUSCULAR | Status: DC
  Administered 2018-03-10: 1400 [IU]/h via INTRAVENOUS
  Filled 2018-03-10 (×2): qty 250

## 2018-03-10 MED ORDER — ONDANSETRON HCL 4 MG/2ML IJ SOLN: 4.0000 mg | Freq: Four times a day (QID) | INTRAMUSCULAR | Status: DC | PRN

## 2018-03-10 MED ORDER — PANTOPRAZOLE SODIUM 40 MG PO TBEC
40.0000 mg | DELAYED_RELEASE_TABLET | Freq: Every day | ORAL | Status: DC
  Administered 2018-03-11: 40 mg via ORAL
  Filled 2018-03-10: qty 1

## 2018-03-10 MED ORDER — INSULIN ASPART 100 UNIT/ML ~~LOC~~ SOLN: 0.0000 [IU] | Freq: Three times a day (TID) | SUBCUTANEOUS | Status: DC

## 2018-03-10 MED ORDER — AMLODIPINE BESYLATE 5 MG PO TABS
2.5000 mg | ORAL_TABLET | Freq: Every day | ORAL | Status: DC
  Filled 2018-03-10: qty 1

## 2018-03-10 MED ORDER — ACETAMINOPHEN 650 MG RE SUPP: 650.0000 mg | Freq: Four times a day (QID) | RECTAL | Status: DC | PRN

## 2018-03-10 MED ORDER — CARVEDILOL 25 MG PO TABS
25.0000 mg | ORAL_TABLET | Freq: Two times a day (BID) | ORAL | Status: DC
  Administered 2018-03-11: 25 mg via ORAL
  Filled 2018-03-10 (×3): qty 1

## 2018-03-10 MED ORDER — ACETAMINOPHEN 325 MG PO TABS
650.0000 mg | ORAL_TABLET | Freq: Four times a day (QID) | ORAL | Status: DC | PRN
  Filled 2018-03-10: qty 2

## 2018-03-10 NOTE — ED Notes (Signed)
Pt sat in chair at bedside. Moved back into stretcher with staff assistance

## 2018-03-10 NOTE — Progress Notes (Signed)
Pt refused ABG. RT notified the doctor. ABG was canceled

## 2018-03-10 NOTE — ED Provider Notes (Signed)
[place] DEPT Provider Note   CSN: [id number] Arrival date & time: 03/10/18  1612     History   Chief Complaint Chief Complaint  Patient presents with  . Abnormal Lab  . Back Pain    HPI Kimberly Howell is a 72 y.o. female.  72yo female sent by PCP, arrives with her son and sister. Patient with history of stage 3 CKD, non insulin dependent diabetes, HTN, morbid obesity. Seen by PCP today for upper back pain, orthopnea, mild edema lower extremities.  Patient reports cough with shortness of breath and right upper back pain x1/2 weeks, was seen at her PCP 1 week ago for cough, diagnosed with bronchitis and given an albuterol inhaler, Tessalon, Zithromax.  Patient followed up today due to ongoing shortness of breath and pain in the right side of her back.  Patient reports shortness of breath with exertion which is normal for her however has been having shortness of breath when lying supine as well as pain in her right upper back with lying supine for the past week and a half.  PCP sent off labs including a BNP and a d-dimer, the BNP was within normal limits, d-dimer was elevated and patient was sent to the emergency room for a VQ scan (unable to CT scan due to her chronic kidney disease).  Kimberly Howell was also found to have an O2 sat of 88% in her PCP office and was started on home O2 therapy.  Patient states the pain in her right upper back radiates through to the right side of her chest at times when it "gets to be severe."     History reviewed. No pertinent past medical history.  There are no active problems to display for this patient.   OB History   None      Home Medications    Prior to Admission medications   Medication Sig Start Date End Date Taking? Authorizing Provider  amLODipine (NORVASC) 2.5 MG tablet Take 2.5 mg by mouth daily. 12/26/17  Yes [provider]  ASPIRIN 81 PO Take 81 mg by mouth daily. 12/22/17  Yes [provider]  benzonatate (TESSALON) 100 MG capsule Take 100 mg by mouth 3 (three) times daily as needed for cough. 03/05/18  Yes [provider]  carvedilol (COREG) 25 MG tablet Take 25 mg by mouth 2 (two) times daily. 12/19/17  Yes [provider]  COLCRYS 0.6 MG tablet Take 0.6 mg by mouth daily. 12/26/17  Yes [provider]  esomeprazole (NEXIUM) 40 MG capsule Take 40 mg by mouth daily. 12/30/17  Yes [provider]  Febuxostat 80 MG TABS Take 40 mg by mouth daily. 02/10/18  Yes [provider]  furosemide (LASIX) 20 MG tablet Take 20 mg by mouth daily. 03/02/18  Yes [provider]  glimepiride (AMARYL) 2 MG tablet Take 2 mg by mouth daily. 01/15/18  Yes [provider]  HYDROcodone-acetaminophen (NORCO) 10-325 MG tablet Take 1 tablet by mouth daily as needed for pain. 03/09/18  Yes [provider]  IRON PO Take 1 tablet by mouth daily. 12/22/17  Yes [provider]  JANUVIA 100 MG tablet Take 100 mg by mouth daily. 02/06/18  Yes [provider]  lisinopril (PRINIVIL,ZESTRIL) 40 MG tablet Take 40 mg by mouth daily. 01/22/18  Yes [provider]  meclizine (ANTIVERT) 25 MG tablet Take 25 mg by mouth daily. 12/19/17  Yes [provider]  methocarbamol (ROBAXIN) 750 MG tablet  Take 750 mg by mouth every 8 (eight) hours as needed for muscle spasms. 02/16/18  Yes [provider]  Multiple Vitamin (MULTIVITAMIN WITH MINERALS) TABS tablet Take 1 tablet by mouth daily. 12/22/17  Yes [provider]  pravastatin (PRAVACHOL) 40 MG tablet Take 40 mg by mouth daily. 12/26/17  Yes [provider]  traMADol (ULTRAM) 50 MG tablet Take 50-100 mg by mouth daily.  03/01/18  Yes [provider]  VENTOLIN HFA 108 (90 Base) MCG/ACT inhaler Inhale 2 puffs into the lungs every 6 (six) hours as needed. 03/05/18  Yes [provider]  VOLTAREN 1 % GEL Apply 2 g topically every 6 (six) hours. 12/22/17   Yes [provider]  ZYRTEC ALLERGY 10 MG tablet Take 10 mg by mouth daily. 12/22/17  Yes [provider]    Family History History reviewed. No pertinent family history.  Social History Social History   Tobacco Use  . Smoking status: Not on file  Substance Use Topics  . Alcohol use: Not on file  . Drug use: Not on file     Allergies   Patient has no known allergies.   Review of Systems Review of Systems  Constitutional: Negative for chills, diaphoresis and fever.  Respiratory: Positive for cough and shortness of breath. Negative for chest tightness.   Cardiovascular: Positive for chest pain.  Gastrointestinal: Negative for abdominal pain, constipation, diarrhea, nausea and vomiting.  Genitourinary: Negative for difficulty urinating, frequency and urgency.  Musculoskeletal: Positive for back pain.  Skin: Negative for rash and wound.  Allergic/Immunologic: Positive for immunocompromised state.  Neurological: Negative for dizziness and weakness.  Hematological: Does not bruise/bleed easily.  Psychiatric/Behavioral: Negative for confusion.  All other systems reviewed and are negative.    Physical Exam Updated Vital Signs BP (!) 142/75   Pulse 84   Temp 98.1 F (36.7 C) (Oral)   Resp 16   Ht 5\' 1"  (1.549 m)   Wt 135.4 kg   SpO2 96%   BMI 56.40 kg/m   Physical Exam  Constitutional: Kimberly Howell is oriented to person, place, and time. Kimberly Howell appears well-developed and well-nourished. No distress.  HENT:  Head: Normocephalic and atraumatic.  Eyes: Conjunctivae are normal.  Cardiovascular: Normal rate, regular rhythm, normal heart sounds and intact distal pulses.  No murmur heard. Pulmonary/Chest: Effort normal and breath sounds normal. No respiratory distress. Kimberly Howell has no wheezes. Kimberly Howell exhibits no tenderness.  Abdominal: Soft. Kimberly Howell exhibits no distension. There is no tenderness.  Musculoskeletal: Kimberly Howell exhibits tenderness. Kimberly Howell exhibits no deformity.        Back:  Neurological: Kimberly Howell is alert and oriented to person, place, and time.  Skin: Skin is warm and dry. Kimberly Howell is not diaphoretic.  Psychiatric: Kimberly Howell has a normal mood and affect. Her behavior is normal.  Nursing note and vitals reviewed.    ED Treatments / Results  Labs (all labs ordered are listed, but only abnormal results are displayed) Labs Reviewed  CBC WITH DIFFERENTIAL/PLATELET - Abnormal; Notable for the following components:      Result Value   WBC 11.1 (*)    Hemoglobin 11.9 (*)    Neutro Abs 8.8 (*)    All other components within normal limits  BASIC METABOLIC PANEL - Abnormal; Notable for the following components:   Glucose, Bld 135 (*)    BUN 44 (*)    Creatinine, Ser 2.73 (*)    GFR calc non Af Amer 17 (*)    GFR calc Af Amer 19 (*)  All other components within normal limits  TROPONIN I  MAGNESIUM  PROTIME-INR  APTT    EKG EKG Interpretation  Date/Time:  Wednesday March 10 2018 18:34:43 EDT Ventricular Rate:  85 PR Interval:    QRS Duration: 86 QT Interval:  368 QTC Calculation: 438 R Axis:   4 Text Interpretation:  Sinus rhythm Low voltage, precordial leads No old tracing to compare Non-specific TW changes in aVL, III Otherwise no significant change Confirmed by Duffy Bruce [phone]) on 03/10/2018 6:44:18 PM   Radiology Dg Chest 2 View  Result Date: 03/10/2018 CLINICAL DATA:  Posterior chest pain with cough and congestion for 1 week EXAM: CHEST - 2 VIEW COMPARISON:  07/03/2004 FINDINGS: The heart size and mediastinal contours are within normal limits. Both lungs are clear. The visualized skeletal structures are unremarkable. IMPRESSION: No active cardiopulmonary disease. Electronically Signed   By: Kathreen Devoid   On: 03/10/2018 11:39    Procedures Procedures (including critical care time)  Medications Ordered in ED Medications - No data to display   Initial Impression / Assessment and Plan / ED Course  I have reviewed the triage vital  signs and the nursing notes.  Pertinent labs & imaging results that were available during my care of the patient were reviewed by me and considered in my medical decision making (see chart for details).  Clinical Course as of Mar 10 2134  Wed Mar 10, 2018  2058 72yo female brought in by family for elevated d-dimer (1.8) at PCP office today. Patient was seen in follow up for cough/SHOB/dyspnea/right upper back pain x 1.5 weeks (originally thought to be bronchitis and treated with inhaler, zpack, tessalon), BNP WNL at PCP office, elevated baseline Cr due to history of stage 3 CKD. Patient also found to be hypoxic in PCP office with O2 88%, started on home O2 and sent to the Er, triage O2 88%. While in the Er, patient has maintained O2 sats greater than 90% on room air, states Kimberly Howell is breathing deep to try and keep the number on the monitor up which may be why Kimberly Howell is tachypneic at times. Due to her CKD, unable to CTA r/o Pe, unable to obtain a VQ scan at this hour, unable to give Lovenox due to her GFR. Discussed with Dr. Ellender Hose, ER attending who has seen the patient, agrees with plan to admit for monitoring and VQ scan tomorrow. Patient is agreeable with plan, does not want any further needle sticks (has IV in place).    [LM]  2105 EXAM: CHEST - 2 VIEW  COMPARISON:  07/03/2004  FINDINGS: The heart size and mediastinal contours are within normal limits. Both lungs are clear. The visualized skeletal structures are unremarkable.  IMPRESSION: No active cardiopulmonary disease.   Electronically Signed   By: Kathreen Devoid   On: 03/10/2018 11:39   [LM]  2134 Discussed with Dr. Tamala Julian, [facility], will admit, requests heparin, coags.    [LM]    Clinical Course User Index [LM] Tacy Learn, PA-C   Final Clinical Impressions(s) / ED Diagnoses   Final diagnoses:  Shortness of breath  Elevated d-dimer    ED Discharge Orders    None       Roque Lias 03/10/18  2135    Duffy Bruce, MD 03/11/18 1429

## 2018-03-10 NOTE — Progress Notes (Signed)
ANTICOAGULATION CONSULT NOTE - Initial Consult  Pharmacy Consult for Heparin Indication: presumed PE  No Known Allergies  Patient Measurements: Height: 5\' 1"  (154.9 cm) Weight: 298 lb 8 oz (135.4 kg) IBW/kg (Calculated) : 47.8 Heparin Dosing Weight: 82 kg  Vital Signs: Temp: 98.1 F (36.7 C) (09/25 1635) Temp Source: Oral (09/25 1635) BP: 142/75 (09/25 2104) Pulse Rate: 84 (09/25 2104)  Labs: Recent Labs    03/10/18 1819  HGB 11.9*  HCT 39.0  PLT 243  CREATININE 2.73*  TROPONINI <0.03    Estimated Creatinine Clearance: 25.4 mL/min (A) (by C-G formula based on SCr of 2.73 mg/dL (H)).   Medical History: History reviewed. No pertinent past medical history.  Medications:   Assessment: 72 y/o F with a h/o CKD recently treated for bronchitis with ongoing SOB, back pain fount to have elevated d-dimer at PCP office. VQ scan not possible due to CKD. Heparin initiated for presumed PE.   Goal of Therapy:  Heparin level 0.3-0.7 units/ml Monitor platelets by anticoagulation protocol: Yes   Plan:  Give 5000 units bolus x 1 Start heparin infusion at 1400 units/hr Check anti-Xa level in 6 hours and daily while on heparin Continue to monitor H&H and platelets  Ulice Dash D 03/10/2018,9:44 PM

## 2018-03-10 NOTE — ED Triage Notes (Signed)
Pt reports that her d dimer was elevated at her doctors office today at 1.88. She was sent over for evaluation of a PE. She is complaining of back pain. O2 88% and ordered to start home O2 today. A&Ox4.

## 2018-03-10 NOTE — ED Notes (Signed)
Per DJ, before ambulating pt, pt's O2 was 96% without oxygen supplementation. Upon ambulating, O2 dropped 88%. When pt sat back down in chair, O2 increased to 94%.

## 2018-03-10 NOTE — H&P (Signed)
History and Physical    Kimberly Howell YOV:[id number] DOB: 1945/07/18 DOA: 03/10/2018  Referring MD/NP/PA: Suella Broad, PA-C PCP: Kimberly Carol, MD  Patient coming from: Home  Chief Complaint: Elevated d-dimer  I have personally briefly reviewed patient's old medical records in [place]   HPI: Kimberly Howell is a 72 y.o. female with medical history significant of HTN, HLD, DM type II, CKD stage III, and morbid obesity; who was sent for elevated d-dimer.  Patient gone to her primary care office 6 days ago with complaints of a sharp right upper back pain and productive cough with yellowish sputum production.  At that time she was diagnosed with bronchitis treated with Zithromax, albuterol inhaler, and Tessalon Perles.  She reports that she continued to have a cough, but sputum changed to clear color.  The pain in her upper back persisted and reports that it would radiate to her chest.  Associated symptoms include do  shortness of breath, intermittent wheezing, orthopnea, and chronically has lower extremity swelling that is unchanged.  She followed back up today with her primary care provider due to symptoms.  Labs were sent off and including BNP and d-dimer.  D-dimer came back elevated at 1.08 and BNP was within normal limits.  At the office patient was noted to have O2 saturations of 88% and was started on oxygen which she is not normally on at baseline.   ED Course: Upon admission into the emergency department patient was noted to be afebrile, pulse 84-96, respiration 15-29, O2 saturations as low as 88% on room air, and improved to 97% with 2 L nasal cannula oxygen.  Labs were significant for WBC 11.1, hemoglobin 11.9, BUN 44, creatinine 2.73.  Chest x-ray was clear for any acute abnormalities.  Patient was started on a heparin drip and a VQ scan was ordered although will not be able to be obtained until in a.m.  TRH called to admit for suspected PE.  Review of Systems    Constitutional: Positive for malaise/fatigue. Negative for chills and fever.  HENT: Negative for congestion and nosebleeds.   Eyes: Negative for double vision and photophobia.  Respiratory: Positive for cough, sputum production, shortness of breath and wheezing.   Cardiovascular: Positive for chest pain and leg swelling.  Gastrointestinal: Negative for abdominal pain, diarrhea, nausea and vomiting.  Genitourinary: Negative for dysuria and frequency.  Musculoskeletal: Positive for back pain. Negative for falls and myalgias.  Neurological: Positive for weakness. Negative for focal weakness and loss of consciousness.  Psychiatric/Behavioral: Negative for substance abuse. The patient has insomnia.     Past Medical History:  Diagnosis Date  . Gout   . Gout   . HLD (hyperlipidemia)   . HTN (hypertension)   . Morbid obesity ([place])   . Non-insulin dependent type 2 diabetes mellitus ([place])     History reviewed. No pertinent surgical history.   reports that she has never smoked. She does not have any smokeless tobacco history on file. She reports that she drank alcohol. She reports that she has current or past drug history.  No Known Allergies  Family History  Problem Relation Age of Onset  . Lung cancer Father   . Prostate cancer Father     Prior to Admission medications   Medication Sig Start Date End Date Taking? Authorizing Provider  amLODipine (NORVASC) 2.5 MG tablet Take 2.5 mg by mouth daily. 12/26/17  Yes [provider]  ASPIRIN 81 PO Take 81 mg by mouth daily. 12/22/17  Yes [provider]  benzonatate (TESSALON) 100 MG capsule Take 100 mg by mouth 3 (three) times daily as needed for cough. 03/05/18  Yes [provider]  carvedilol (COREG) 25 MG tablet Take 25 mg by mouth 2 (two) times daily. 12/19/17  Yes [provider]  COLCRYS 0.6 MG tablet Take 0.6 mg by mouth daily. 12/26/17  Yes [provider]  esomeprazole (NEXIUM) 40 MG capsule  Take 40 mg by mouth daily. 12/30/17  Yes [provider]  Febuxostat 80 MG TABS Take 40 mg by mouth daily. 02/10/18  Yes [provider]  furosemide (LASIX) 20 MG tablet Take 20 mg by mouth daily. 03/02/18  Yes [provider]  glimepiride (AMARYL) 2 MG tablet Take 2 mg by mouth daily. 01/15/18  Yes [provider]  HYDROcodone-acetaminophen (NORCO) 10-325 MG tablet Take 1 tablet by mouth daily as needed for pain. 03/09/18  Yes [provider]  IRON PO Take 1 tablet by mouth daily. 12/22/17  Yes [provider]  JANUVIA 100 MG tablet Take 100 mg by mouth daily. 02/06/18  Yes [provider]  lisinopril (PRINIVIL,ZESTRIL) 40 MG tablet Take 40 mg by mouth daily. 01/22/18  Yes [provider]  meclizine (ANTIVERT) 25 MG tablet Take 25 mg by mouth daily. 12/19/17  Yes [provider]  methocarbamol (ROBAXIN) 750 MG tablet Take 750 mg by mouth every 8 (eight) hours as needed for muscle spasms. 02/16/18  Yes [provider]  Multiple Vitamin (MULTIVITAMIN WITH MINERALS) TABS tablet Take 1 tablet by mouth daily. 12/22/17  Yes [provider]  pravastatin (PRAVACHOL) 40 MG tablet Take 40 mg by mouth daily. 12/26/17  Yes [provider]  traMADol (ULTRAM) 50 MG tablet Take 50-100 mg by mouth daily.  03/01/18  Yes [provider]  VENTOLIN HFA 108 (90 Base) MCG/ACT inhaler Inhale 2 puffs into the lungs every 6 (six) hours as needed. 03/05/18  Yes [provider]  VOLTAREN 1 % GEL Apply 2 g topically every 6 (six) hours. 12/22/17  Yes [provider]  ZYRTEC ALLERGY 10 MG tablet Take 10 mg by mouth daily. 12/22/17  Yes [provider]    Physical Exam:  Constitutional: Morbidly obese female who appears to be in some discomfort Vitals:   03/10/18 2047 03/10/18 2104 03/10/18 2130 03/10/18 2200  BP: (!) 154/93 (!) 142/75 (!) 131/119 139/86  Pulse: 88 84 89 96  Resp: 15 16 20 16   Temp:       TempSrc:      SpO2: 91% 96% 94% 97%  Weight:      Height:       Eyes: PERRL, lids and conjunctivae normal ENMT: Mucous membranes are moist. Posterior pharynx clear of any exudate or lesions.Normal dentition.  Neck: normal, supple, no masses, no thyromegaly Respiratory: clear to auscultation bilaterally, no wheezing, no crackles. Normal respiratory effort. No accessory muscle use.   Cardiovascular: Regular rate and rhythm, no murmurs / rubs / gallops. No extremity edema. 2+ pedal pulses. No carotid bruits.  Abdomen: no tenderness, no masses palpated. No hepatosplenomegaly. Bowel sounds positive.  Musculoskeletal: no clubbing / cyanosis. No joint deformity upper and lower extremities. Good ROM, no contractures. Normal muscle tone.  Skin: no rashes, lesions, ulcers. No induration Neurologic: CN 2-12 grossly intact. Sensation intact, DTR normal. Strength 5/5 in all 4.  Psychiatric: Normal judgment and insight. Alert and oriented x 3. Normal mood.     Labs on Admission: I have personally  reviewed following labs and imaging studies  CBC: Recent Labs  Lab 03/10/18 1819  WBC 11.1*  NEUTROABS 8.8*  HGB 11.9*  HCT 39.0  MCV 96.8  PLT 025   Basic Metabolic Panel: Recent Labs  Lab 03/10/18 1819  NA 140  K 4.9  CL 102  CO2 28  GLUCOSE 135*  BUN 44*  CREATININE 2.73*  CALCIUM 9.1  MG 2.3   GFR: Estimated Creatinine Clearance: 25.4 mL/min (A) (by C-G formula based on SCr of 2.73 mg/dL (H)). Liver Function Tests: No results for input(s): AST, ALT, ALKPHOS, BILITOT, PROT, ALBUMIN in the last 168 hours. No results for input(s): LIPASE, AMYLASE in the last 168 hours. No results for input(s): AMMONIA in the last 168 hours. Coagulation Profile: No results for input(s): INR, PROTIME in the last 168 hours. Cardiac Enzymes: Recent Labs  Lab 03/10/18 1819  TROPONINI <0.03   BNP (last 3 results) No results for input(s): PROBNP in the last 8760 hours. HbA1C: No results for  input(s): HGBA1C in the last 72 hours. CBG: No results for input(s): GLUCAP in the last 168 hours. Lipid Profile: No results for input(s): CHOL, HDL, LDLCALC, TRIG, CHOLHDL, LDLDIRECT in the last 72 hours. Thyroid Function Tests: No results for input(s): TSH, T4TOTAL, FREET4, T3FREE, THYROIDAB in the last 72 hours. Anemia Panel: No results for input(s): VITAMINB12, FOLATE, FERRITIN, TIBC, IRON, RETICCTPCT in the last 72 hours. Urine analysis: No results found for: COLORURINE, APPEARANCEUR, LABSPEC, PHURINE, GLUCOSEU, HGBUR, BILIRUBINUR, KETONESUR, PROTEINUR, UROBILINOGEN, NITRITE, LEUKOCYTESUR Sepsis Labs: No results found for this or any previous visit (from the past 240 hour(s)).   Radiological Exams on Admission: Dg Chest 2 View  Result Date: 03/10/2018 CLINICAL DATA:  Posterior chest pain with cough and congestion for 1 week EXAM: CHEST - 2 VIEW COMPARISON:  07/03/2004 FINDINGS: The heart size and mediastinal contours are within normal limits. Both lungs are clear. The visualized skeletal structures are unremarkable. IMPRESSION: No active cardiopulmonary disease. Electronically Signed   By: Kathreen Devoid   On: 03/10/2018 11:39    EKG: Independently reviewed.  Sinus rhythm 85 bpm with signs of low voltage.  Assessment/Plan Acute respiratory failure with hypoxia, elevated d-dimer: Acute.  Patient presents with reports of right upper back pain over the last week.  D-dimer elevated at 1.88 with new onset hypoxia.  Unable to check CT angiogram of the chest due to kidney function.  - Admit to a telemetry bed - Continuous pulse oximetry with nasal cannula oxygen as needed - Heparin drip per pharmacy - Check VQ scan in a.m. - May warrant echocardiogram/Doppler duplex of the lower extremities  Leukocytosis: Acute.  Initial WBC elevated 11.1.  Suspect that this could be reactive to underlying process seen above versus possibility of respiratory infection. - Recheck CBC in a.m.  Question  acute kidney injury on chronic kidney disease stage III: Patient presents with a creatinine of 2.7 3 with BUN 44.  Elevated BUN to creatinine ratio suggest prerenal cause of symptoms.  Patient previously noted to have a creatinine of 2.55 on lab work from PCP. - Normal saline IV fluids at 100 mL/h - Hold nephrotoxic agents - Recheck kidney function in a.m.  Diabetes mellitus type 2 - Hypoglycemic protocols - Hold Amaryl and Januvia - CBGs q. before meals and at bedtime with sensitive SSI  Essential hypertension - Hold lisinopril and Lasix - Continue Coreg and amlodipine  Hyperlipidemia - Continue  Pravastatin  Morbid obesity: BMI 56.4  DVT prophylaxis: Heparin Code Status:  Full Family Communication: Discussed plan of care with the patient family present at bedside Disposition Plan: Likely discharge home once medically stable in 1 to 2 days Consults called: None Admission status: Observation  Norval Morton MD [facility] Pager [phone]   If 7PM-7AM, please contact night-coverage [email or web address] Password [facility]  03/10/2018, 10:24 PM

## 2018-03-11 ENCOUNTER — Encounter (HOSPITAL_COMMUNITY): Payer: Self-pay | Admitting: Internal Medicine

## 2018-03-11 ENCOUNTER — Observation Stay (HOSPITAL_BASED_OUTPATIENT_CLINIC_OR_DEPARTMENT_OTHER)
Admit: 2018-03-11 | Discharge: 2018-03-11 | Disposition: A | Discharge status: Home / Self Care | Payer: Medicare HMO | Attending: Internal Medicine | Admitting: Internal Medicine

## 2018-03-11 ENCOUNTER — Observation Stay (HOSPITAL_COMMUNITY): Payer: Medicare HMO

## 2018-03-11 ENCOUNTER — Observation Stay (HOSPITAL_BASED_OUTPATIENT_CLINIC_OR_DEPARTMENT_OTHER): Payer: Medicare HMO

## 2018-03-11 DIAGNOSIS — M7989 Other specified soft tissue disorders: Secondary | ICD-10-CM

## 2018-03-11 DIAGNOSIS — N179 Acute kidney failure, unspecified: Secondary | ICD-10-CM

## 2018-03-11 DIAGNOSIS — Z6841 Body Mass Index (BMI) 40.0 and over, adult: Secondary | ICD-10-CM | POA: Diagnosis not present

## 2018-03-11 DIAGNOSIS — N189 Chronic kidney disease, unspecified: Secondary | ICD-10-CM | POA: Diagnosis not present

## 2018-03-11 DIAGNOSIS — R0600 Dyspnea, unspecified: Secondary | ICD-10-CM | POA: Diagnosis not present

## 2018-03-11 DIAGNOSIS — E1169 Type 2 diabetes mellitus with other specified complication: Secondary | ICD-10-CM | POA: Diagnosis not present

## 2018-03-11 DIAGNOSIS — D72829 Elevated white blood cell count, unspecified: Secondary | ICD-10-CM

## 2018-03-11 DIAGNOSIS — E785 Hyperlipidemia, unspecified: Secondary | ICD-10-CM

## 2018-03-11 DIAGNOSIS — R0602 Shortness of breath: Secondary | ICD-10-CM | POA: Diagnosis not present

## 2018-03-11 DIAGNOSIS — R7989 Other specified abnormal findings of blood chemistry: Secondary | ICD-10-CM

## 2018-03-11 DIAGNOSIS — J9621 Acute and chronic respiratory failure with hypoxia: Secondary | ICD-10-CM | POA: Diagnosis not present

## 2018-03-11 DIAGNOSIS — J9601 Acute respiratory failure with hypoxia: Secondary | ICD-10-CM

## 2018-03-11 HISTORY — DX: Morbid (severe) obesity due to excess calories: E66.01

## 2018-03-11 HISTORY — DX: Hyperlipidemia, unspecified: E78.5

## 2018-03-11 HISTORY — DX: Other specified abnormal findings of blood chemistry: R79.89

## 2018-03-11 HISTORY — DX: Elevated white blood cell count, unspecified: D72.829

## 2018-03-11 LAB — BASIC METABOLIC PANEL
ANION GAP: 7 (ref 5–15)
BUN: 45 mg/dL — ABNORMAL HIGH (ref 8–23)
CHLORIDE: 106 mmol/L (ref 98–111)
CO2: 30 mmol/L (ref 22–32)
CREATININE: 2.59 mg/dL — AB (ref 0.44–1.00)
Calcium: 8.8 mg/dL — ABNORMAL LOW (ref 8.9–10.3)
GFR calc non Af Amer: 18 mL/min — ABNORMAL LOW (ref >60)
GFR, EST AFRICAN AMERICAN: 21 mL/min — AB (ref >60)
Glucose, Bld: 135 mg/dL — ABNORMAL HIGH (ref 70–99)
POTASSIUM: 4.7 mmol/L (ref 3.5–5.1)
Sodium: 143 mmol/L (ref 135–145)

## 2018-03-11 LAB — CBC
HEMATOCRIT: 34.7 % — AB (ref 36.0–46.0)
HEMOGLOBIN: 10.8 g/dL — AB (ref 12.0–15.0)
MCH: 30 pg (ref 26.0–34.0)
MCHC: 31.1 g/dL (ref 30.0–36.0)
MCV: 96.4 fL (ref 78.0–100.0)
Platelets: 212 10*3/uL (ref 150–400)
RBC: 3.6 MIL/uL — ABNORMAL LOW (ref 3.87–5.11)
RDW: 13.9 % (ref 11.5–15.5)
WBC: 9 10*3/uL (ref 4.0–10.5)

## 2018-03-11 LAB — URINALYSIS, ROUTINE W REFLEX MICROSCOPIC
Bilirubin Urine: NEGATIVE
GLUCOSE, UA: NEGATIVE mg/dL
HGB URINE DIPSTICK: NEGATIVE
Ketones, ur: NEGATIVE mg/dL
NITRITE: POSITIVE — AB
Protein, ur: NEGATIVE mg/dL
SPECIFIC GRAVITY, URINE: 1.015 (ref 1.005–1.030)
pH: 5 (ref 5.0–8.0)

## 2018-03-11 LAB — CBG MONITORING, ED
GLUCOSE-CAPILLARY: 120 mg/dL — AB (ref 70–99)
GLUCOSE-CAPILLARY: 186 mg/dL — AB (ref 70–99)

## 2018-03-11 LAB — GLUCOSE, CAPILLARY
GLUCOSE-CAPILLARY: 118 mg/dL — AB (ref 70–99)
Glucose-Capillary: 113 mg/dL — ABNORMAL HIGH (ref 70–99)

## 2018-03-11 LAB — HEPARIN LEVEL (UNFRACTIONATED)
HEPARIN UNFRACTIONATED: 1.28 [IU]/mL — AB (ref 0.30–0.70)
HEPARIN UNFRACTIONATED: 0.76 [IU]/mL — AB (ref 0.30–0.70)

## 2018-03-11 LAB — APTT: aPTT: >200 seconds (ref 24–36)

## 2018-03-11 LAB — ECHOCARDIOGRAM COMPLETE
HEIGHTINCHES: 61 in
WEIGHTICAEL: 4776 [oz_av]

## 2018-03-11 MED ORDER — MORPHINE SULFATE (PF) 2 MG/ML IV SOLN
2.0000 mg | INTRAVENOUS | Status: AC
  Administered 2018-03-11: 2 mg via INTRAVENOUS
  Filled 2018-03-11: qty 1

## 2018-03-11 MED ORDER — MORPHINE SULFATE (PF) 2 MG/ML IV SOLN: 2.0000 mg | INTRAVENOUS | Status: DC | PRN

## 2018-03-11 MED ORDER — MORPHINE SULFATE (PF) 4 MG/ML IV SOLN: 4.0000 mg | INTRAVENOUS | Status: DC

## 2018-03-11 MED ORDER — ENOXAPARIN SODIUM 30 MG/0.3ML ~~LOC~~ SOLN: 30.0000 mg | SUBCUTANEOUS | Status: DC

## 2018-03-11 MED ORDER — TECHNETIUM TO 99M ALBUMIN AGGREGATED
4.4104 | Freq: Once | INTRAVENOUS | Status: AC | PRN
  Administered 2018-03-11: 4.4104 via INTRAVENOUS

## 2018-03-11 MED ORDER — LORAZEPAM 2 MG/ML IJ SOLN: 0.5000 mg | Freq: Once | INTRAMUSCULAR | Status: DC | PRN

## 2018-03-11 MED ORDER — PERFLUTREN LIPID MICROSPHERE
1.0000 mL | INTRAVENOUS | Status: DC | PRN
  Administered 2018-03-11: 2 mL via INTRAVENOUS
  Filled 2018-03-11: qty 10

## 2018-03-11 MED ORDER — TECHNETIUM TC 99M DIETHYLENETRIAME-PENTAACETIC ACID
32.6000 | Freq: Once | INTRAVENOUS | Status: AC | PRN
  Administered 2018-03-11: 32.6 via INTRAVENOUS

## 2018-03-11 NOTE — Progress Notes (Signed)
SATURATION QUALIFICATIONS: (This note is used to comply with regulatory documentation for home oxygen)  Patient Saturations on Room Air at Rest = 92%  Patient Saturations on Room Air while Ambulating = 85%  Patient Saturations on 2 Liters of oxygen while Ambulating = 91%  Please briefly explain why patient needs home oxygen: 

## 2018-03-11 NOTE — Progress Notes (Signed)
ANTICOAGULATION CONSULT NOTE - Initial Consult  Pharmacy Consult for Heparin Indication: presumed PE  No Known Allergies  Patient Measurements: Height: 5\' 1"  (154.9 cm) Weight: 298 lb 8 oz (135.4 kg) IBW/kg (Calculated) : 47.8 Heparin Dosing Weight: 82 kg  Vital Signs: BP: 119/83 (09/26 0851) Pulse Rate: 73 (09/26 0851)  Labs: Recent Labs    03/10/18 1819 03/10/18 2132 03/11/18 0735 03/11/18 1157  HGB 11.9*  --  10.8*  --   HCT 39.0  --  34.7*  --   PLT 243  --  212  --   APTT  --  33 >200*  --   LABPROT  --  13.6  --   --   INR  --  1.05  --   --   HEPARINUNFRC  --   --  1.28* 0.76*  CREATININE 2.73*  --  2.59*  --   TROPONINI <0.03  --   --   --     Estimated Creatinine Clearance: 26.8 mL/min (A) (by C-G formula based on SCr of 2.59 mg/dL (H)).   Medical History: Past Medical History:  Diagnosis Date  . Gout   . Gout   . HLD (hyperlipidemia)   . HTN (hypertension)   . Morbid obesity ([place])   . Non-insulin dependent type 2 diabetes mellitus ([facility])     Medications:   Assessment: 72 y/o F with a h/o CKD recently treated for bronchitis with ongoing SOB, back pain fount to have elevated d-dimer at PCP office. Heparin initiated for presumed PE.   Today, 03/11/18   Hgb 10.8, Plt 212  Initial HL drawn this morning = 1.28 was supratherapeutic on infusion of heparin 1400 units/hr. After discussing with RN, was informed that patient had refused peripheral lab draw and heparin level was drawn from IV line that heparin was infusing in (drip paused 5 mins to draw level).   Likely that HL does not reflect an accurate HL and is falsely elevated due to being drawn from site of heparin infusion  No signs/symptoms of bleeding per RN  Informed MD that pt was refusing lab draws - orders to continue heparin drip until VQ scan and doppler completed  9/26: VQ scan - no evidence of PE  9/26: no obvious DVT visualized in LE venous doppler on preliminary report  Goal of  Therapy:  Heparin level 0.3-0.7 units/ml Monitor platelets by anticoagulation protocol: Yes   Plan:   Ordered redraw of HL   Per discussion with MD, heparin drip was discontinued  Lenis Noon, PharmD Clinical Pharmacist 03/11/2018,12:58 PM

## 2018-03-11 NOTE — ED Notes (Signed)
ED TO INPATIENT HANDOFF REPORT  Name/Age/Gender Kimberly Howell 72 y.o. female  Code Status    Code Status Orders  (From admission, onward)         Start     Ordered   03/10/18 2236  Full code  Continuous     03/10/18 2237        Code Status History    This patient has a current code status but no historical code status.      Home/SNF/Other Home  Chief Complaint shortness of breath/back pain/blood clot  Level of Care/Admitting Diagnosis ED Disposition    ED Disposition Condition Comment   Admit  Hospital Area: [facility] [[id number]]  Level of Care: Telemetry [5]  Admit to tele based on following criteria: Complex arrhythmia (Bradycardia/Tachycardia)  Diagnosis: Acute respiratory failure with hypoxia [facility]) [[id number]]  Admitting Physician: Norval Morton [[id number]]  Attending Physician: Norval Morton [[id number]]  PT Class (Do Not Modify): Observation [104]  PT Acc Code (Do Not Modify): Observation [10022]       Medical History Past Medical History:  Diagnosis Date  . Gout   . Gout   . HLD (hyperlipidemia)   . HTN (hypertension)   . Morbid obesity ([place])   . Non-insulin dependent type 2 diabetes mellitus ([place])     Allergies No Known Allergies  IV Location/Drains/Wounds Patient Lines/Drains/Airways Status   Active Line/Drains/Airways    Name:   Placement date:   Placement time:   Site:   Days:   Peripheral IV 03/10/18 Left Antecubital   03/10/18    1900    Antecubital   1          Labs/Imaging Results for orders placed or performed during the hospital encounter of 03/10/18 (from the past 48 hour(s))  CBC with Differential     Status: Abnormal   Collection Time: 03/10/18  6:19 PM  Result Value Ref Range   WBC 11.1 (H) 4.0 - 10.5 K/uL   RBC 4.03 3.87 - 5.11 MIL/uL   Hemoglobin 11.9 (L) 12.0 - 15.0 g/dL   HCT 39.0 36.0 - 46.0 %   MCV 96.8 78.0 - 100.0 fL   MCH 29.5 26.0 - 34.0 pg   MCHC 30.5 30.0 - 36.0 g/dL   RDW 13.6 11.5 -  15.5 %   Platelets 243 150 - 400 K/uL   Neutrophils Relative % 79 %   Neutro Abs 8.8 (H) 1.7 - 7.7 K/uL   Lymphocytes Relative 14 %   Lymphs Abs 1.5 0.7 - 4.0 K/uL   Monocytes Relative 5 %   Monocytes Absolute 0.5 0.1 - 1.0 K/uL   Eosinophils Relative 2 %   Eosinophils Absolute 0.2 0.0 - 0.7 K/uL   Basophils Relative 0 %   Basophils Absolute 0.0 0.0 - 0.1 K/uL    Comment: Performed at [facility], [place] [address] [place], [place] [address]  Troponin I     Status: None   Collection Time: 03/10/18  6:19 PM  Result Value Ref Range   Troponin I <0.03 <0.03 ng/mL    Comment: Performed at [facility], [place] [address], [place], [place] [address]  Magnesium     Status: None   Collection Time: 03/10/18  6:19 PM  Result Value Ref Range   Magnesium 2.3 1.7 - 2.4 mg/dL    Comment: Performed at [facility], [place] [address] [place],  [address]  Basic metabolic panel  Status: Abnormal   Collection Time: 03/10/18  6:19 PM  Result Value Ref Range   Sodium 140 135 - 145 mmol/L   Potassium 4.9 3.5 - 5.1 mmol/L   Chloride 102 98 - 111 mmol/L   CO2 28 22 - 32 mmol/L   Glucose, Bld 135 (H) 70 - 99 mg/dL   BUN 44 (H) 8 - 23 mg/dL   Creatinine, Ser 2.73 (H) 0.44 - 1.00 mg/dL   Calcium 9.1 8.9 - 10.3 mg/dL   GFR calc non Af Amer 17 (L) >60 mL/min   GFR calc Af Amer 19 (L) >60 mL/min    Comment: (NOTE) The eGFR has been calculated using the CKD EPI equation. This calculation has not been validated in all clinical situations. eGFR's persistently <60 mL/min signify possible Chronic Kidney Disease.    Anion gap 10 5 - 15    Comment: Performed at [facility], [place] [address] [place], [place] [address]  Protime-INR     Status: None   Collection Time: 03/10/18  9:32 PM  Result Value Ref Range   Prothrombin Time 13.6 11.4 - 15.2 seconds   INR 1.05     Comment: Performed at [facility], [place]  [address] [place], [place] [address]  APTT     Status: None   Collection Time: 03/10/18  9:32 PM  Result Value Ref Range   aPTT 33 24 - 36 seconds    Comment: Performed at [facility], [place] [address], [place], [place] [address]  Urinalysis, Routine w reflex microscopic     Status: Abnormal   Collection Time: 03/10/18 10:27 PM  Result Value Ref Range   Color, Urine YELLOW YELLOW   APPearance CLEAR CLEAR   Specific Gravity, Urine 1.015 1.005 - 1.030   pH 5.0 5.0 - 8.0   Glucose, UA NEGATIVE NEGATIVE mg/dL   Hgb urine dipstick NEGATIVE NEGATIVE   Bilirubin Urine NEGATIVE NEGATIVE   Ketones, ur NEGATIVE NEGATIVE mg/dL   Protein, ur NEGATIVE NEGATIVE mg/dL   Nitrite POSITIVE (A) NEGATIVE   Leukocytes, UA SMALL (A) NEGATIVE   RBC / HPF 0-5 0 - 5 RBC/hpf   WBC, UA 21-50 0 - 5 WBC/hpf   Bacteria, UA FEW (A) NONE SEEN   Squamous Epithelial / LPF 0-5 0 - 5   Mucus PRESENT    Hyaline Casts, UA PRESENT     Comment: Performed at [facility], [place] [address], [place], [place] [address]  CBG monitoring, ED     Status: Abnormal   Collection Time: 03/10/18 11:59 PM  Result Value Ref Range   Glucose-Capillary 186 (H) 70 - 99 mg/dL  CBC     Status: Abnormal   Collection Time: 03/11/18  7:35 AM  Result Value Ref Range   WBC 9.0 4.0 - 10.5 K/uL   RBC 3.60 (L) 3.87 - 5.11 MIL/uL   Hemoglobin 10.8 (L) 12.0 - 15.0 g/dL   HCT 34.7 (L) 36.0 - 46.0 %   MCV 96.4 78.0 - 100.0 fL   MCH 30.0 26.0 - 34.0 pg   MCHC 31.1 30.0 - 36.0 g/dL   RDW 13.9 11.5 - 15.5 %   Platelets 212 150 - 400 K/uL    Comment: Performed at [facility], [place] [address], [place], [place] [address]  Basic metabolic panel     Status: Abnormal   Collection Time: 03/11/18  7:35 AM  Result Value Ref Range   Sodium 143 135 -  145 mmol/L   Potassium 4.7 3.5 - 5.1 mmol/L   Chloride 106 98 - 111 mmol/L   CO2 30 22 - 32 mmol/L   Glucose, Bld 135 (H) 70 - 99 mg/dL   BUN 45 (H) 8  - 23 mg/dL   Creatinine, Ser 2.59 (H) 0.44 - 1.00 mg/dL   Calcium 8.8 (L) 8.9 - 10.3 mg/dL   GFR calc non Af Amer 18 (L) >60 mL/min   GFR calc Af Amer 21 (L) >60 mL/min    Comment: (NOTE) The eGFR has been calculated using the CKD EPI equation. This calculation has not been validated in all clinical situations. eGFR's persistently <60 mL/min signify possible Chronic Kidney Disease.    Anion gap 7 5 - 15    Comment: Performed at [facility], [place] [address] [place], [place] [address]  CBG monitoring, ED     Status: Abnormal   Collection Time: 03/11/18  7:42 AM  Result Value Ref Range   Glucose-Capillary 120 (H) 70 - 99 mg/dL   Dg Chest 2 View  Result Date: 03/10/2018 CLINICAL DATA:  Posterior chest pain with cough and congestion for 1 week EXAM: CHEST - 2 VIEW COMPARISON:  07/03/2004 FINDINGS: The heart size and mediastinal contours are within normal limits. Both lungs are clear. The visualized skeletal structures are unremarkable. IMPRESSION: No active cardiopulmonary disease. Electronically Signed   By: Kathreen Devoid   On: 03/10/2018 11:39    Pending Labs Unresulted Labs (From admission, onward)    Start     Ordered   03/11/18 0800  Heparin level (unfractionated)  Once-Timed,   STAT     03/10/18 2212   03/11/18 0500  APTT  Tomorrow morning,   R     03/10/18 2237   03/10/18 2235  HIV antibody (Routine Testing)  Once,   R     03/10/18 2237          Vitals/Pain Today's Vitals   03/11/18 0500 03/11/18 0541 03/11/18 0600 03/11/18 0700  BP: 112/75 112/75 103/70 109/77  Pulse: 73 82 74 71  Resp: 16 (!) _0 Temp:      TempSrc:      SpO2: 96% 97% 97% 99%  Weight:      Height:      PainSc:        Isolation Precautions No active isolations  Medications Medications  heparin ADULT infusion 100 units/mL (25000 units/272m sodium chloride 0.45%) (1,400 Units/hr Intravenous Rate/Dose Verify 03/11/18 0654)  pravastatin (PRAVACHOL) tablet 40 mg (has no  administration in time range)  amLODipine (NORVASC) tablet 2.5 mg (has no administration in time range)  carvedilol (COREG) tablet 25 mg (25 mg Oral Given 03/11/18 0005)  pantoprazole (PROTONIX) EC tablet 40 mg (has no administration in time range)  sodium chloride flush (NS) 0.9 % injection 3 mL (3 mLs Intravenous Given 03/11/18 0006)  0.9 %  sodium chloride infusion ( Intravenous New Bag/Given 03/11/18 0703)  acetaminophen (TYLENOL) tablet 650 mg (has no administration in time range)    Or  acetaminophen (TYLENOL) suppository 650 mg (has no administration in time range)  ondansetron (ZOFRAN) tablet 4 mg (has no administration in time range)    Or  ondansetron (ZOFRAN) injection 4 mg (has no administration in time range)  albuterol (PROVENTIL) (2.5 MG/3ML) 0.083% nebulizer solution 2.5 mg (has no administration in time range)  insulin aspart (novoLOG) injection 0-9 Units (0 Units Subcutaneous Not Given 03/11/18 0758)  insulin aspart (novoLOG) injection  0-5 Units (0 Units Subcutaneous Not Given 03/11/18 0001)  morphine 2 MG/ML injection 2 mg (has no administration in time range)  LORazepam (ATIVAN) injection 0.5 mg (has no administration in time range)  heparin bolus via infusion 5,000 Units (5,000 Units Intravenous Bolus from Bag 03/10/18 2204)  morphine 2 MG/ML injection 2 mg (2 mg Intravenous Given 03/11/18 0706)    Mobility walks with person assist

## 2018-03-11 NOTE — ED Notes (Signed)
Pt refused to draw blood work at this time. Pt states she "does not want to be stuck anymore."

## 2018-03-11 NOTE — Discharge Summary (Signed)
Physician Discharge Summary  Kimberly Howell SWN:[id number] DOB: 22-Feb-1946 DOA: 03/10/2018  PCP: Seward Carol, MD  Admit date: 03/10/2018 Discharge date: 03/11/2018  Admitted From: Home Disposition: Home  Recommendations for Outpatient Follow-up:  1. Follow up with PCP in 1-2 weeks 2. Have PCP refer to Pulmonary for further workup and have sleep Study done for suspected OSA/OHS 3. Please obtain CMP/CBC, Mag, Phos in one week 4. Repeat CXR in 3-6 weeks 5. Please follow up on the following pending results:   Home Health: No Equipment/Devices: None   Discharge Condition: Stable  CODE STATUS: FULL CODE Diet recommendation: Heart Healthy   Brief/Interim Summary: HPI per Dr. Fuller Plan on 03/10/18  Kimberly Howell is a 72 y.o. female with medical history significant of HTN, HLD, DM type II, CKD stage III, and morbid obesity; who was sent for elevated d-dimer.  Patient gone to her primary care office 6 days ago with complaints of a sharp right upper back pain and productive cough with yellowish sputum production.  At that time she was diagnosed with bronchitis treated with Zithromax, albuterol inhaler, and Tessalon Perles.  She reports that she continued to have a cough, but sputum changed to clear color.  The pain in her upper back persisted and reports that it would radiate to her chest.  Associated symptoms include do  shortness of breath, intermittent wheezing, orthopnea, and chronically has lower extremity swelling that is unchanged.  She followed back up today with her primary care provider due to symptoms.  Labs were sent off and including BNP and d-dimer.  D-dimer came back elevated at 1.08 and BNP was within normal limits.  At the office patient was noted to have O2 saturations of 88% and was started on oxygen which she is not normally on at baseline.   ED Course: Upon admission into the emergency department patient was noted to be afebrile, pulse 84-96, respiration 15-29, O2  saturations as low as 88% on room air, and improved to 97% with 2 L nasal cannula oxygen.  Labs were significant for WBC 11.1, hemoglobin 11.9, BUN 44, creatinine 2.73.  Chest x-ray was clear for any acute abnormalities.  Patient was started on a heparin drip and a VQ scan was ordered although will not be able to be obtained until in a.m.  [facility] called to admit for suspected PE.  Patient ruled out for pulmonary embolus however was still hypoxic on discharge likley 2/2 to OSA/OHS and deconditioning from body habitus.  She ambulated and desaturated on room air however she has 2 L of oxygen at home that she was just placed on and when she was placed back on her home 2 L she did not desaturate.  She is deemed medically stable to be discharged and will need to follow-up with primary care physician as well as pulmonary in outpatient setting for sleep study for suspected OSA/OHS  Discharge Diagnoses:  Principal Problem:   Acute respiratory failure with hypoxia ([place]) Active Problems:   Elevated d-dimer   Acute kidney injury superimposed on chronic kidney disease ([place])   Leukocytosis   Morbid obesity with BMI of 50.0-59.9, adult ([place])   Hyperlipidemia   Type 2 diabetes mellitus with hyperlipidemia ([place])  Acute on Chronic Respiratory Failure with Hypoxia, elevated D-Dimer -Acute.  Patient presents with reports of right upper back pain over the last week.   D-dimer elevated at 1.88 with new onset hypoxia, however upon review of patient's records she has been placed on supplemental oxygen via  nasal cannula as an outpatient and had just been given oxygen yesterday.   -Unable to check CT angiogram of the chest due to kidney function so VQ scan was done and showed No evidence of acute pulmonary embolism. -LE Duplex Limited but no Evidence of DVT on a limited evaluation -Admit to a Telemetry Bed -Continuous pulse oximetry with nasal cannula oxygen as needed -Heparin drip per pharmacy and now  stopped -ECHOCardiogram Normal -Troponin Not elevated  -Home Ambulatory Screen done and requires 2 Liters but she is already on 2 Liters at home -Suspect she has some component of OSA/OHS and recommend outpatient sleep study and Pulmonary Evaluation  Leukocytosis -Acute.  Initial WBC elevated 11.1.  Suspect that this could be reactive to underlying process seen above versus possibility of respiratory infection but has improved -Continue to Monitor S/Sx of Infection -Repeat CBC in AM  Acute kidney injury on chronic kidney disease stage III -Patient presents with a creatinine of 2.7 3 with BUN 44.  Elevated BUN to creatinine ratio suggest prerenal cause of symptoms.  Patient previously noted to have a creatinine of 2.55 on lab work from PCP. - Normal saline IV fluids at 100 mL/h and now BUN/Cr improved to 45/2.59 - Hold nephrotoxic agents - Recheck kidney function as an outpatient   Diabetes Mellitus Type 2 - Hypoglycemic protocols - Resume Amaryl and Januvia - CBGs q. before meals and at bedtime with sensitive SSI - CBG's Ranging from 113-186 - Follow Up with a PCP   Essential Hypertension - Held Lisinopril and Lasix while hospitalized but ok to resume at D/C - Continue Coreg and amlodipine  Hyperlipidemia - Continue Pravastatin  Morbid Obesity -Estimated body mass index is 56.4 kg/m as calculated from the following:   Height as of this encounter: 5\' 1"  (1.549 m).   Weight as of this encounter: 135.4 kg. -Weight Loss Counseling given   Discharge Instructions  Discharge Instructions    Call MD for:  difficulty breathing, headache or visual disturbances   Complete by:  As directed    Call MD for:  extreme fatigue   Complete by:  As directed    Call MD for:  hives   Complete by:  As directed    Call MD for:  persistant dizziness or light-headedness   Complete by:  As directed    Call MD for:  persistant nausea and vomiting   Complete by:  As directed    Call MD  for:  redness, tenderness, or signs of infection (pain, swelling, redness, odor or green/yellow discharge around incision site)   Complete by:  As directed    Call MD for:  severe uncontrolled pain   Complete by:  As directed    Call MD for:  temperature >100.4   Complete by:  As directed    Diet - low sodium heart healthy   Complete by:  As directed    Diet Carb Modified   Complete by:  As directed    Discharge instructions   Complete by:  As directed    You were cared for by a hospitalist during your hospital stay. If you have any questions about your discharge medications or the care you received while you were in the hospital after you are discharged, you can call the unit and ask to speak with the hospitalist on call if the hospitalist that took care of you is not available. Once you are discharged, your primary care physician will handle any further medical issues. Please  note that NO REFILLS for any discharge medications will be authorized once you are discharged, as it is imperative that you return to your primary care physician (or establish a relationship with a primary care physician if you do not have one) for your aftercare needs so that they can reassess your need for medications and monitor your lab values.  Follow up with PCP and Pulmonary as an outpatient and have evaluation for OSA/OHS. Take all medications as prescribed. If symptoms change or worsen please return to the ED for evaluation   Increase activity slowly   Complete by:  As directed      Allergies as of 03/11/2018   No Known Allergies     Medication List    TAKE these medications   amLODipine 2.5 MG tablet Commonly known as:  NORVASC Take 2.5 mg by mouth daily.   ASPIRIN 81 PO Take 81 mg by mouth daily.   benzonatate 100 MG capsule Commonly known as:  TESSALON Take 100 mg by mouth 3 (three) times daily as needed for cough.   carvedilol 25 MG tablet Commonly known as:  COREG Take 25 mg by mouth 2 (two)  times daily.   COLCRYS 0.6 MG tablet Generic drug:  colchicine Take 0.6 mg by mouth daily.   esomeprazole 40 MG capsule Commonly known as:  NEXIUM Take 40 mg by mouth daily.   Febuxostat 80 MG Tabs Take 40 mg by mouth daily.   furosemide 20 MG tablet Commonly known as:  LASIX Take 20 mg by mouth daily.   glimepiride 2 MG tablet Commonly known as:  AMARYL Take 2 mg by mouth daily.   HYDROcodone-acetaminophen 10-325 MG tablet Commonly known as:  NORCO Take 1 tablet by mouth daily as needed for pain.   IRON PO Take 1 tablet by mouth daily.   JANUVIA 100 MG tablet Generic drug:  sitaGLIPtin Take 100 mg by mouth daily.   lisinopril 40 MG tablet Commonly known as:  PRINIVIL,ZESTRIL Take 40 mg by mouth daily.   meclizine 25 MG tablet Commonly known as:  ANTIVERT Take 25 mg by mouth daily.   methocarbamol 750 MG tablet Commonly known as:  ROBAXIN Take 750 mg by mouth every 8 (eight) hours as needed for muscle spasms.   multivitamin with minerals Tabs tablet Take 1 tablet by mouth daily.   pravastatin 40 MG tablet Commonly known as:  PRAVACHOL Take 40 mg by mouth daily.   traMADol 50 MG tablet Commonly known as:  ULTRAM Take 50-100 mg by mouth daily.   VENTOLIN HFA 108 (90 Base) MCG/ACT inhaler Generic drug:  albuterol Inhale 2 puffs into the lungs every 6 (six) hours as needed.   VOLTAREN 1 % Gel Generic drug:  diclofenac sodium Apply 2 g topically every 6 (six) hours.   ZYRTEC ALLERGY 10 MG tablet Generic drug:  cetirizine Take 10 mg by mouth daily.       No Known Allergies  Consultations:  None  Procedures/Studies: Dg Chest 2 View  Result Date: 03/10/2018 CLINICAL DATA:  Posterior chest pain with cough and congestion for 1 week EXAM: CHEST - 2 VIEW COMPARISON:  07/03/2004 FINDINGS: The heart size and mediastinal contours are within normal limits. Both lungs are clear. The visualized skeletal structures are unremarkable. IMPRESSION: No active  cardiopulmonary disease. Electronically Signed   By: Kathreen Devoid   On: 03/10/2018 11:39   Nm Pulmonary Vent And Perf (v/q Scan)  Result Date: 03/11/2018 CLINICAL DATA:  Short of breath. Back  pain. Concern for pulmonary embolism. EXAM: NUCLEAR MEDICINE VENTILATION - PERFUSION LUNG SCAN TECHNIQUE: Ventilation images were obtained in multiple projections using inhaled aerosol Tc-63m DTPA. Perfusion images were obtained in multiple projections after intravenous injection of Tc-17m-MAA. RADIOPHARMACEUTICALS:  36.2 mCi of Tc-104m DTPA aerosol inhalation and 4.4 mCi Tc63m-MAA IV COMPARISON:  Chest radiograph 03/10/2018 FINDINGS: Ventilation: Overall poor ventilation imaging. No focal defect identified. Perfusion: No wedge shaped peripheral perfusion defects to suggest acute pulmonary embolism. IMPRESSION: No evidence of acute pulmonary embolism. Electronically Signed   By: Suzy Bouchard M.D.   On: 03/11/2018 11:27   ECHOCARDIOGRAM ------------------------------------------------------------------- Study Conclusions  - Left ventricle: The cavity size was normal. Systolic function was   normal. The estimated ejection fraction was in the range of 60%   to 65%. Wall motion was normal; there were no regional wall   motion abnormalities. The study is not technically sufficient to   allow evaluation of LV diastolic function. - Aortic valve: There was no regurgitation. - Left atrium: The atrium was normal in size. - Right ventricle: The cavity size was normal. Wall thickness was   normal. Systolic function was normal. - Right atrium: The atrium was normal in size. - Tricuspid valve: There was no regurgitation. - Pulmonary arteries: Systolic pressure could not be accurately   estimated. - Pericardium, extracardiac: There was no pericardial effusion.  VENOUS DUPLEX Final Interpretation: Right: There is no evidence of deep vein thrombosis in the lower extremity. However, portions of this examination  were limited- see technologist comments above. Left: There is no evidence of deep vein thrombosis in the lower extremity. However, portions of this examination were limited- see technologist comments above.   Subjective: Seen and examined at bedside and states that her shortness of breath is stable and she thinks it is from her recent bronchitis.  No chest pain, lightheadedness or dizziness.  States she is doing better and does not want any more "sticks."  Wanted to go home.  No other concerns quit at this time  Discharge Exam: Vitals:   03/11/18 0851 03/11/18 1551  BP: 119/83 105/60  Pulse: 73 78  Resp: 17   Temp:  97.8 F (36.6 C)  SpO2: 97% 100%   Vitals:   03/11/18 0600 03/11/18 0700 03/11/18 0851 03/11/18 1551  BP: 103/70 109/77 119/83 105/60  Pulse: 74 71 73 78  Resp: 20 19 17    Temp:    97.8 F (36.6 C)  TempSrc:    Oral  SpO2: 97% 99% 97% 100%  Weight:      Height:       General: Pt is alert, awake, not in acute distress Cardiovascular: RRR, S1/S2 +, no rubs, no gallops Respiratory: Diminished bilaterally, no wheezing, no rhonchi Abdominal: Soft, NT, Distended 2/2 to body habiuts, bowel sounds + Extremities: 1+ LE edema, no cyanosis  The results of significant diagnostics from this hospitalization (including imaging, microbiology, ancillary and laboratory) are listed below for reference.    Microbiology: No results found for this or any previous visit (from the past 240 hour(s)).   Labs: BNP (last 3 results) No results for input(s): BNP in the last 8760 hours. Basic Metabolic Panel: Recent Labs  Lab 03/10/18 1819 03/11/18 0735  NA 140 143  K 4.9 4.7  CL 102 106  CO2 28 30  GLUCOSE 135* 135*  BUN 44* 45*  CREATININE 2.73* 2.59*  CALCIUM 9.1 8.8*  MG 2.3  --    Liver Function Tests: No results for input(s): AST,  ALT, ALKPHOS, BILITOT, PROT, ALBUMIN in the last 168 hours. No results for input(s): LIPASE, AMYLASE in the last 168 hours. No results for  input(s): AMMONIA in the last 168 hours. CBC: Recent Labs  Lab 03/10/18 1819 03/11/18 0735  WBC 11.1* 9.0  NEUTROABS 8.8*  --   HGB 11.9* 10.8*  HCT 39.0 34.7*  MCV 96.8 96.4  PLT 243 212   Cardiac Enzymes: Recent Labs  Lab 03/10/18 1819  TROPONINI <0.03   BNP: Invalid input(s): POCBNP CBG: Recent Labs  Lab 03/10/18 2359 03/11/18 0742 03/11/18 1209 03/11/18 1729  GLUCAP 186* 120* 113* 118*   D-Dimer No results for input(s): DDIMER in the last 72 hours. Hgb A1c No results for input(s): HGBA1C in the last 72 hours. Lipid Profile No results for input(s): CHOL, HDL, LDLCALC, TRIG, CHOLHDL, LDLDIRECT in the last 72 hours. Thyroid function studies No results for input(s): TSH, T4TOTAL, T3FREE, THYROIDAB in the last 72 hours.  Invalid input(s): FREET3 Anemia work up No results for input(s): VITAMINB12, FOLATE, FERRITIN, TIBC, IRON, RETICCTPCT in the last 72 hours. Urinalysis    Component Value Date/Time   COLORURINE YELLOW 03/10/2018 2227   APPEARANCEUR CLEAR 03/10/2018 2227   LABSPEC 1.015 03/10/2018 2227   PHURINE 5.0 03/10/2018 2227   GLUCOSEU NEGATIVE 03/10/2018 2227   HGBUR NEGATIVE 03/10/2018 2227   BILIRUBINUR NEGATIVE 03/10/2018 2227   KETONESUR NEGATIVE 03/10/2018 2227   PROTEINUR NEGATIVE 03/10/2018 2227   NITRITE POSITIVE (A) 03/10/2018 2227   LEUKOCYTESUR SMALL (A) 03/10/2018 2227   Sepsis Labs Invalid input(s): PROCALCITONIN,  WBC,  LACTICIDVEN Microbiology No results found for this or any previous visit (from the past 240 hour(s)).  Time coordinating discharge: 35 minutes  SIGNED:  Kerney Elbe, DO [facility] 03/11/2018, 6:48 PM Pager is on [place]  If 7PM-7AM, please contact night-coverage [email or web address] Password [email or web address]

## 2018-03-11 NOTE — Progress Notes (Signed)
LE venous duplex prelim: very limited due to body habitus. Portions not well visualized for adequate evaluation. No obvious DVT in visualized veins.  Landry Mellow, RDMS, RVT

## 2018-03-11 NOTE — Progress Notes (Signed)
Delay in patient being admitted to the floor, due to patient getting vq scan prior to arrival.

## 2018-03-11 NOTE — Progress Notes (Signed)
Patient given discharge instructions, and verbalized an understanding of all discharge instructions.  Patient agrees with discharge plan, and is being discharged in stable medical condition.  Patient given transportation via wheelchair. 

## 2018-03-11 NOTE — ED Notes (Signed)
Bed: WA20 Expected date:  Expected time:  Means of arrival:  Comments: rm [id number]

## 2018-03-11 NOTE — Progress Notes (Signed)
  Echocardiogram 2D Echocardiogram with definity has been performed.  Patient sensitive under breast. Unable to accurately visualize LV function.  Kimberly Howell 03/11/2018, 3:19 PM

## 2018-03-12 LAB — HIV ANTIBODY (ROUTINE TESTING W REFLEX): HIV SCREEN 4TH GENERATION: NONREACTIVE

## 2018-03-18 DIAGNOSIS — J449 Chronic obstructive pulmonary disease, unspecified: Secondary | ICD-10-CM | POA: Diagnosis not present

## 2018-03-18 DIAGNOSIS — G4733 Obstructive sleep apnea (adult) (pediatric): Secondary | ICD-10-CM | POA: Diagnosis not present

## 2018-03-18 DIAGNOSIS — R0902 Hypoxemia: Secondary | ICD-10-CM | POA: Diagnosis not present

## 2018-03-23 NOTE — Progress Notes (Signed)
Synopsis: Referred in October 2019  for low oxygen levels by Seward Carol, MD  Subjective:   PATIENT ID: Kimberly Howell GENDER: female DOB: 10/21/1945, MRN: [id number]  Chief Complaint  Patient presents with  . Consult    States her oxygen is dropping while she is walking and during, oxygen at night at 3L. States she has had repeated bronchitis, increased SOB, wheezing, and no cough.     PMH of HTN, HLD, morbid obesity, BMI 56.4, recently admitted for hypoxemia and positive d-dimer, PE workup was negative, negative BL duplex but they were TDS d/t body habitus, VQ scan low prob, unable to do CTA d/t CKDIII and elevated Scr.   She was recently diagnosed with hypoxemia overnight. She kept having O2 sats drop at night time. She was diagnosed with sleep apnea >20 years ago. Former smoker, quit in 1995, smoked for 20+ years for 1 ppd. Weight has always been an issue. She has always head trouble keeping weight off. No cough, no GERD, denies heart burn.   She currently has a CPAP machine but she hasn't used in a long.   SOB worse with going up hill and distances. Associated with wheezing and chest tightness.    Past Medical History:  Diagnosis Date  . Gout   . Gout   . HLD (hyperlipidemia)   . HTN (hypertension)   . Morbid obesity ([place])   . Non-insulin dependent type 2 diabetes mellitus ([place])      Family History  Problem Relation Age of Onset  . Prostate cancer Father   . Lung cancer Father      Past Surgical History:  Procedure Laterality Date  . DILATION AND CURETTAGE, DIAGNOSTIC / THERAPEUTIC    . KNEE SURGERY Bilateral   . TUBAL LIGATION  1979    Social History   Socioeconomic History  . Marital status: Married    Spouse name: Not on file  . Number of children: Not on file  . Years of education: Not on file  . Highest education level: Not on file  Occupational History  . Not on file  Social Needs  . Financial resource strain: Not on file  . Food insecurity:      Worry: Not on file    Inability: Not on file  . Transportation needs:    Medical: Not on file    Non-medical: Not on file  Tobacco Use  . Smoking status: Never Smoker  . Smokeless tobacco: Never Used  Substance and Sexual Activity  . Alcohol use: Not Currently  . Drug use: Not Currently  . Sexual activity: Not on file  Lifestyle  . Physical activity:    Days per week: Not on file    Minutes per session: Not on file  . Stress: Not on file  Relationships  . Social connections:    Talks on phone: Not on file    Gets together: Not on file    Attends religious service: Not on file    Active member of club or organization: Not on file    Attends meetings of clubs or organizations: Not on file    Relationship status: Not on file  . Intimate partner violence:    Fear of current or ex partner: Not on file    Emotionally abused: Not on file    Physically abused: Not on file    Forced sexual activity: Not on file  Other Topics Concern  . Not on file  Social History Narrative  .  Not on file     Allergies  Allergen Reactions  . Prednisone     Increased blood sugars      Outpatient Medications Prior to Visit  Medication Sig Dispense Refill  . amLODipine (NORVASC) 2.5 MG tablet Take 2.5 mg by mouth daily.    . ASPIRIN 81 PO Take 81 mg by mouth daily.    . carvedilol (COREG) 25 MG tablet Take 25 mg by mouth 2 (two) times daily.    Marland Kitchen COLCRYS 0.6 MG tablet Take 0.6 mg by mouth daily.    . Febuxostat 80 MG TABS Take 40 mg by mouth daily.    . furosemide (LASIX) 20 MG tablet Take 20 mg by mouth daily.    Marland Kitchen HYDROcodone-acetaminophen (NORCO) 10-325 MG tablet Take 1 tablet by mouth daily as needed for pain.    . IRON PO Take 1 tablet by mouth daily.    Marland Kitchen JANUVIA 100 MG tablet Take 100 mg by mouth daily.  3  . lisinopril (PRINIVIL,ZESTRIL) 40 MG tablet Take 40 mg by mouth daily.    . meclizine (ANTIVERT) 25 MG tablet Take 25 mg by mouth daily.    . methocarbamol (ROBAXIN) 750 MG  tablet Take 750 mg by mouth every 8 (eight) hours as needed for muscle spasms.  0  . Multiple Vitamin (MULTIVITAMIN WITH MINERALS) TABS tablet Take 1 tablet by mouth daily.    . pravastatin (PRAVACHOL) 40 MG tablet Take 40 mg by mouth daily.    . traMADol (ULTRAM) 50 MG tablet Take 50-100 mg by mouth daily.   0  . VOLTAREN 1 % GEL Apply 2 g topically every 6 (six) hours.    Marland Kitchen ZYRTEC ALLERGY 10 MG tablet Take 10 mg by mouth daily.    . VENTOLIN HFA 108 (90 Base) MCG/ACT inhaler Inhale 2 puffs into the lungs every 6 (six) hours as needed.  0  . benzonatate (TESSALON) 100 MG capsule Take 100 mg by mouth 3 (three) times daily as needed for cough.  0  . esomeprazole (NEXIUM) 40 MG capsule Take 40 mg by mouth daily.    Marland Kitchen glimepiride (AMARYL) 2 MG tablet Take 2 mg by mouth daily.     No facility-administered medications prior to visit.     Review of Systems  Constitutional: Positive for weight loss. Negative for chills, fever and malaise/fatigue.       10 pound weight loss since hospitalization  HENT: Negative for hearing loss, sore throat and tinnitus.   Eyes: Negative for blurred vision and double vision.  Respiratory: Positive for shortness of breath. Negative for cough, hemoptysis, sputum production, wheezing and stridor.        Dyspnea on exertion  Cardiovascular: Negative for chest pain, palpitations, orthopnea, leg swelling and PND.  Gastrointestinal: Negative for abdominal pain, constipation, diarrhea, heartburn, nausea and vomiting.  Genitourinary: Negative for dysuria, hematuria and urgency.  Musculoskeletal: Positive for back pain. Negative for joint pain and myalgias.  Skin: Negative for itching and rash.  Neurological: Negative for dizziness, tingling, weakness and headaches.  Endo/Heme/Allergies: Negative for environmental allergies. Does not bruise/bleed easily.  Psychiatric/Behavioral: Negative for depression. The patient is not nervous/anxious and does not have insomnia.   All  other systems reviewed and are negative.    Objective:  Physical Exam  Constitutional: She is oriented to person, place, and time. She appears well-developed and well-nourished. No distress.  HENT:  Head: Normocephalic and atraumatic.  Mouth/Throat: Oropharynx is clear and moist.  mallampati 3/4  Eyes: Pupils are equal, round, and reactive to light. Conjunctivae are normal. No scleral icterus.  Neck: Neck supple. No JVD present. No tracheal deviation present.  Cardiovascular: Normal rate, regular rhythm, normal heart sounds and intact distal pulses.  Distant heart tones, no overt murmur  Pulmonary/Chest: Effort normal. No accessory muscle usage or stridor. No tachypnea. No respiratory distress. She has no wheezes. She has no rhonchi. She has no rales.  Diminished breath sounds bilaterally, shallow respiratory effort.  Abdominal: Soft. She exhibits no distension. There is no tenderness.  Obese pannus, difficult to auscultate  Musculoskeletal: She exhibits no edema or tenderness.  Lymphadenopathy:    She has no cervical adenopathy.  Neurological: She is alert and oriented to person, place, and time.  Skin: Skin is warm and dry. Capillary refill takes less than 2 seconds. No rash noted.  Psychiatric: She has a normal mood and affect. Her behavior is normal.  Vitals reviewed.    Vitals:   03/24/18 0936  BP: 118/74  Pulse: 78  SpO2: 95%  Weight: 292 lb (132.5 kg)  Height: 5' 0.5" (1.537 m)   95% on RA BMI Readings from Last 3 Encounters:  03/24/18 56.09 kg/m  03/10/18 56.40 kg/m   Wt Readings from Last 3 Encounters:  03/24/18 292 lb (132.5 kg)  03/10/18 298 lb 8 oz (135.4 kg)     CBC    Component Value Date/Time   WBC 9.0 03/11/2018 0735   RBC 3.60 (L) 03/11/2018 0735   HGB 10.8 (L) 03/11/2018 0735   HCT 34.7 (L) 03/11/2018 0735   PLT 212 03/11/2018 0735   MCV 96.4 03/11/2018 0735   MCH 30.0 03/11/2018 0735   MCHC 31.1 03/11/2018 0735   RDW 13.9 03/11/2018 0735    LYMPHSABS 1.5 03/10/2018 1819   MONOABS 0.5 03/10/2018 1819   EOSABS 0.2 03/10/2018 1819   BASOSABS 0.0 03/10/2018 1819    Chest Imaging: 03/11/2018: VQ low probability  03/10/2018: Chest x-ray, low volumes, no active process The patient's images have been independently reviewed by me.    Pulmonary Functions Testing Results: None  FeNO: None  Pathology: None  Echocardiogram:  September 2019: Technically difficult study, preserved ejection fraction no comment on diastolic dysfunction  Heart Catheterization: None    Assessment & Plan:   SOB (shortness of breath) - Plan: Pulmonary function test, Ambulatory Referral for DME, CANCELED: Pulmonary function test  DOE (dyspnea on exertion)  Acute respiratory failure with hypoxia (HCC)  Elevated d-dimer  Morbid obesity with BMI of 50.0-59.9, adult (HCC)  Type 2 diabetes mellitus with hyperlipidemia ([place])  Discussion:  This is a morbidly obese 72 year old female with a recent hospitalization for acute hypoxemic respiratory failure, acute bronchitis.  She did have an episode of sharp right upper back pain associated with cough and sputum production she was diagnosed with bronchitis and treated with Zithromax and albuterol.  Her evaluation in the hospital for PE was negative.  As for her nocturnal hypoxemia she needs to continue the following: Continue lasix regimen per nephrology.  Maintain euvolemia. Encouraged weight loss, I believe that her dyspnea on exertion and shortness of breath is likely related to her obesity, BMI 56. Full PFTs and follow up.  Former smoker no prior lung function.  Evaluate for underlying COPD. Will need follow-up with sleep clinic and establish care with sleep doc, she will likely need a repeat PSG before obtaining a new CPAP.  She does have an old CPAP machine and she is not sure that it  works. Continue her current home nocturnal oxygen. We will walk today in the office to see if she has O2 needs with  exertion.    Current Outpatient Medications:  .  amLODipine (NORVASC) 2.5 MG tablet, Take 2.5 mg by mouth daily., Disp: , Rfl:  .  ASPIRIN 81 PO, Take 81 mg by mouth daily., Disp: , Rfl:  .  carvedilol (COREG) 25 MG tablet, Take 25 mg by mouth 2 (two) times daily., Disp: , Rfl:  .  COLCRYS 0.6 MG tablet, Take 0.6 mg by mouth daily., Disp: , Rfl:  .  Febuxostat 80 MG TABS, Take 40 mg by mouth daily., Disp: , Rfl:  .  furosemide (LASIX) 20 MG tablet, Take 20 mg by mouth daily., Disp: , Rfl:  .  HYDROcodone-acetaminophen (NORCO) 10-325 MG tablet, Take 1 tablet by mouth daily as needed for pain., Disp: , Rfl:  .  IRON PO, Take 1 tablet by mouth daily., Disp: , Rfl:  .  JANUVIA 100 MG tablet, Take 100 mg by mouth daily., Disp: , Rfl: 3 .  lisinopril (PRINIVIL,ZESTRIL) 40 MG tablet, Take 40 mg by mouth daily., Disp: , Rfl:  .  meclizine (ANTIVERT) 25 MG tablet, Take 25 mg by mouth daily., Disp: , Rfl:  .  methocarbamol (ROBAXIN) 750 MG tablet, Take 750 mg by mouth every 8 (eight) hours as needed for muscle spasms., Disp: , Rfl: 0 .  Multiple Vitamin (MULTIVITAMIN WITH MINERALS) TABS tablet, Take 1 tablet by mouth daily., Disp: , Rfl:  .  pravastatin (PRAVACHOL) 40 MG tablet, Take 40 mg by mouth daily., Disp: , Rfl:  .  traMADol (ULTRAM) 50 MG tablet, Take 50-100 mg by mouth daily. , Disp: , Rfl: 0 .  VENTOLIN HFA 108 (90 Base) MCG/ACT inhaler, Inhale 2 puffs into the lungs every 6 (six) hours as needed., Disp: 8 g, Rfl: 5 .  VOLTAREN 1 % GEL, Apply 2 g topically every 6 (six) hours., Disp: , Rfl:  .  ZYRTEC ALLERGY 10 MG tablet, Take 10 mg by mouth daily., Disp: , Rfl:  .  benzonatate (TESSALON) 100 MG capsule, Take 100 mg by mouth 3 (three) times daily as needed for cough., Disp: , Rfl: 0 .  esomeprazole (NEXIUM) 40 MG capsule, Take 40 mg by mouth daily., Disp: , Rfl:  .  glimepiride (AMARYL) 2 MG tablet, Take 2 mg by mouth daily., Disp: , Rfl:    Garner Nash, DO [facility] 03/24/2018 2:27 PM

## 2018-03-23 NOTE — Patient Instructions (Addendum)
Thank you for visiting Dr. Valeta Harms at [facility]. Today we recommend the following:  No orders of the defined types were placed in this encounter.  Meds ordered this encounter  Medications  .  VENTOLIN HFA 108 (90 Base) MCG/ACT inhaler    Sig: Inhale 2 puffs into the lungs every 6 (six) hours as needed.    Dispense:  8 g    Refill:  5   Needs to establish with sleep doctor for further management of underlying sleep apnea here in office.   Return to [place] as needed or if symptoms worsen.

## 2018-03-24 ENCOUNTER — Encounter: Payer: Self-pay | Admitting: Pulmonary Disease

## 2018-03-24 ENCOUNTER — Ambulatory Visit: Payer: Medicare HMO | Admitting: Pulmonary Disease

## 2018-03-24 VITALS — BP 118/74 | HR 78 | Ht 60.5 in | Wt 292.0 lb

## 2018-03-24 DIAGNOSIS — R0609 Other forms of dyspnea: Secondary | ICD-10-CM

## 2018-03-24 DIAGNOSIS — R0602 Shortness of breath: Secondary | ICD-10-CM

## 2018-03-24 DIAGNOSIS — J9601 Acute respiratory failure with hypoxia: Secondary | ICD-10-CM

## 2018-03-24 DIAGNOSIS — Z6841 Body Mass Index (BMI) 40.0 and over, adult: Secondary | ICD-10-CM

## 2018-03-24 DIAGNOSIS — E785 Hyperlipidemia, unspecified: Secondary | ICD-10-CM | POA: Diagnosis not present

## 2018-03-24 DIAGNOSIS — E1169 Type 2 diabetes mellitus with other specified complication: Secondary | ICD-10-CM | POA: Diagnosis not present

## 2018-03-24 DIAGNOSIS — R7989 Other specified abnormal findings of blood chemistry: Secondary | ICD-10-CM

## 2018-03-24 MED ORDER — VENTOLIN HFA 108 (90 BASE) MCG/ACT IN AERS: 2.0000 | INHALATION_SPRAY | Freq: Four times a day (QID) | RESPIRATORY_TRACT | 5 refills | Status: AC | PRN

## 2018-03-24 MED ORDER — VENTOLIN HFA 108 (90 BASE) MCG/ACT IN AERS: 2.0000 | INHALATION_SPRAY | Freq: Four times a day (QID) | RESPIRATORY_TRACT | 5 refills | Status: DC | PRN

## 2018-03-24 NOTE — Progress Notes (Signed)
Note in error.

## 2018-03-30 ENCOUNTER — Telehealth: Payer: Self-pay | Admitting: Pulmonary Disease

## 2018-03-30 ENCOUNTER — Other Ambulatory Visit: Payer: Self-pay

## 2018-03-30 DIAGNOSIS — J9601 Acute respiratory failure with hypoxia: Secondary | ICD-10-CM

## 2018-03-30 DIAGNOSIS — R0602 Shortness of breath: Secondary | ICD-10-CM

## 2018-03-30 DIAGNOSIS — J9611 Chronic respiratory failure with hypoxia: Secondary | ICD-10-CM | POA: Diagnosis not present

## 2018-03-30 NOTE — Telephone Encounter (Signed)
An order was placed 03/24/18 for pt's O2.  Called and spoke with pt to see if she has heard anything from [facility].  Pt stated she has not heard anything yet.  Called [facility] and spoke with Corene Cornea to let him know pt is still awaiting a call from them.  Per Corene Cornea, a simply go was sent to pt 03/18/18.  Per Corene Cornea, the order that was sent was for a POC eval but per Corene Cornea, they are no longer doing those.  Per Corene Cornea, if pt is wanting to be tested for smaller tanks, they will be able to do that not for a POC. Per Corene Cornea, Dr. Delfina Redwood ordered O2 which pt did receive.  Per Corene Cornea, [facility] does not have a smaller portable but they do have smaller tanks.  Called and spoke with pt stating to her the information I found out from [facility]. Due to pt wanting the POC for her to use while she is out and about, I have placed an order to switch pt to a different DME for her O2 needs.  Nothing further needed.

## 2018-04-05 DIAGNOSIS — M5442 Lumbago with sciatica, left side: Secondary | ICD-10-CM | POA: Diagnosis not present

## 2018-04-05 DIAGNOSIS — R0601 Orthopnea: Secondary | ICD-10-CM | POA: Diagnosis not present

## 2018-04-05 DIAGNOSIS — G4733 Obstructive sleep apnea (adult) (pediatric): Secondary | ICD-10-CM | POA: Diagnosis not present

## 2018-04-05 DIAGNOSIS — J449 Chronic obstructive pulmonary disease, unspecified: Secondary | ICD-10-CM | POA: Diagnosis not present

## 2018-04-05 DIAGNOSIS — R252 Cramp and spasm: Secondary | ICD-10-CM | POA: Diagnosis not present

## 2018-04-05 DIAGNOSIS — R0609 Other forms of dyspnea: Secondary | ICD-10-CM | POA: Diagnosis not present

## 2018-04-13 ENCOUNTER — Telehealth: Payer: Self-pay | Admitting: Pulmonary Disease

## 2018-04-13 NOTE — Telephone Encounter (Signed)
Called and spoke to Kimberly Howell, patient needs a qualifying walk. Kimberly Howell states patient was sick during the visit, I tried to explain to Kimberly Howell that patient was not sick however she began to talk over me and explained that she had already explained this whomever she spoke with when she left the message. Called and spoke to patient, patient was very frustrated that she needed to be re-qualified for oxygen again stating that we already did that. I did explain to patient that it was for insurance purposes. Appt made for 03/23/2018 at 10:00am. Nothing further is needed at this time.

## 2018-04-19 ENCOUNTER — Institutional Professional Consult (permissible substitution): Payer: Medicare HMO | Admitting: Pulmonary Disease

## 2018-04-23 ENCOUNTER — Encounter: Payer: Self-pay | Admitting: Pulmonary Disease

## 2018-04-23 ENCOUNTER — Ambulatory Visit (INDEPENDENT_AMBULATORY_CARE_PROVIDER_SITE_OTHER): Payer: Medicare HMO | Admitting: *Deleted

## 2018-04-23 ENCOUNTER — Ambulatory Visit (INDEPENDENT_AMBULATORY_CARE_PROVIDER_SITE_OTHER): Payer: Medicare HMO | Admitting: Pulmonary Disease

## 2018-04-23 ENCOUNTER — Telehealth: Payer: Self-pay | Admitting: Pulmonary Disease

## 2018-04-23 VITALS — BP 134/82 | HR 74 | Ht 60.0 in | Wt 295.0 lb

## 2018-04-23 DIAGNOSIS — G4733 Obstructive sleep apnea (adult) (pediatric): Secondary | ICD-10-CM

## 2018-04-23 DIAGNOSIS — J9601 Acute respiratory failure with hypoxia: Secondary | ICD-10-CM

## 2018-04-23 DIAGNOSIS — R0602 Shortness of breath: Secondary | ICD-10-CM

## 2018-04-23 NOTE — Patient Instructions (Signed)
History of obstructive sleep apnea Morbid obesity Daytime need of oxygen, oxygen use at night  Possibility of obesity hypoventilation  An in lab study is most appropriate  I will see you back in the office in about 3 months Call with any significant concerns

## 2018-04-23 NOTE — Telephone Encounter (Signed)
Walked patient today during 50min walk test and pt sats dropped to 71% She recovered on 3L O2, completed remainder of the walk on O2 at 3L above 95% Can we place an order to [facility] for pt to have O2 con't?  BI please advise.

## 2018-04-23 NOTE — Progress Notes (Signed)
Kimberly Howell    [id number]    December 09, 1945  Primary Care Physician:Polite, Jori Moll, MD  Referring Physician: Seward Carol, MD [address] [place], [place] [address]  Chief complaint:   History of obstructive sleep apnea Has not been using CPAP recently    HPI: Patient with history of obstructive sleep apnea Has not been using CPAP recently Significant weight gain over the years  She is morbidly obese, uses oxygen with ambulation, also has required oxygen at night Sleep habits currently very poor Has no set bedtime Takes many naps during the day Usually steps a day about 9 AM but she does not have a set wake up time as well  She stated she is in about 3 sleep studies in the past last one was over 10 years ago She did use CPAP for a while before she got tired of using it-had no specific reason for discontinuing CPAP  No family history of obstructive sleep apnea She is trying to lose some weight recently by being more active She is limited by joint pain  Denies dryness of the mouth in the mornings, no significant headaches  Occupation: No significant occupational history Smoking history: Never smoked  Outpatient Encounter Medications as of 04/23/2018  Medication Sig  . amLODipine (NORVASC) 2.5 MG tablet Take 2.5 mg by mouth daily.  . ASPIRIN 81 PO Take 81 mg by mouth daily.  . carvedilol (COREG) 25 MG tablet Take 25 mg by mouth 2 (two) times daily.  Marland Kitchen COLCRYS 0.6 MG tablet Take 0.6 mg by mouth daily.  Marland Kitchen esomeprazole (NEXIUM) 40 MG capsule Take 40 mg by mouth daily.  . furosemide (LASIX) 20 MG tablet Take 20 mg by mouth daily.  Marland Kitchen glimepiride (AMARYL) 2 MG tablet Take 2 mg by mouth daily.  Marland Kitchen HYDROcodone-acetaminophen (NORCO) 10-325 MG tablet Take 1 tablet by mouth daily as needed for pain.  . IRON PO Take 1 tablet by mouth daily.  Marland Kitchen JANUVIA 100 MG tablet Take 100 mg by mouth daily.  Marland Kitchen lisinopril (PRINIVIL,ZESTRIL) 40 MG tablet Take 40 mg by  mouth daily.  . meclizine (ANTIVERT) 25 MG tablet Take 25 mg by mouth daily.  . methocarbamol (ROBAXIN) 750 MG tablet Take 750 mg by mouth every 8 (eight) hours as needed for muscle spasms.  . Multiple Vitamin (MULTIVITAMIN WITH MINERALS) TABS tablet Take 1 tablet by mouth daily.  . pravastatin (PRAVACHOL) 40 MG tablet Take 40 mg by mouth daily.  . traMADol (ULTRAM) 50 MG tablet Take 50-100 mg by mouth daily.   . VENTOLIN HFA 108 (90 Base) MCG/ACT inhaler Inhale 2 puffs into the lungs every 6 (six) hours as needed.  . VOLTAREN 1 % GEL Apply 2 g topically every 6 (six) hours.  Marland Kitchen ZYRTEC ALLERGY 10 MG tablet Take 10 mg by mouth daily.  . [DISCONTINUED] benzonatate (TESSALON) 100 MG capsule Take 100 mg by mouth 3 (three) times daily as needed for cough.  . [DISCONTINUED] Febuxostat 80 MG TABS Take 40 mg by mouth daily.   No facility-administered encounter medications on file as of 04/23/2018.     Allergies as of 04/23/2018 - Review Complete 04/23/2018  Allergen Reaction Noted  . Prednisone  03/24/2018    Past Medical History:  Diagnosis Date  . Gout   . Gout   . HLD (hyperlipidemia)   . HTN (hypertension)   . Morbid obesity ([place])   . Non-insulin dependent type 2 diabetes mellitus (  [facility])     Past Surgical History:  Procedure Laterality Date  . DILATION AND CURETTAGE, DIAGNOSTIC / THERAPEUTIC    . KNEE SURGERY Bilateral   . TUBAL LIGATION  1979    Family History  Problem Relation Age of Onset  . Prostate cancer Father   . Lung cancer Father     Social History   Socioeconomic History  . Marital status: Married    Spouse name: Not on file  . Number of children: Not on file  . Years of education: Not on file  . Highest education level: Not on file  Occupational History  . Not on file  Social Needs  . Financial resource strain: Not on file  . Food insecurity:    Worry: Not on file    Inability: Not on file  . Transportation needs:    Medical: Not on file     Non-medical: Not on file  Tobacco Use  . Smoking status: Never Smoker  . Smokeless tobacco: Never Used  Substance and Sexual Activity  . Alcohol use: Not Currently  . Drug use: Not Currently  . Sexual activity: Not on file  Lifestyle  . Physical activity:    Days per week: Not on file    Minutes per session: Not on file  . Stress: Not on file  Relationships  . Social connections:    Talks on phone: Not on file    Gets together: Not on file    Attends religious service: Not on file    Active member of club or organization: Not on file    Attends meetings of clubs or organizations: Not on file    Relationship status: Not on file  . Intimate partner violence:    Fear of current or ex partner: Not on file    Emotionally abused: Not on file    Physically abused: Not on file    Forced sexual activity: Not on file  Other Topics Concern  . Not on file  Social History Narrative  . Not on file    Review of Systems  Constitutional: Positive for unexpected weight change.  Eyes: Negative.   Respiratory: Positive for apnea and shortness of breath.   Cardiovascular: Negative.   Gastrointestinal: Negative.   Genitourinary: Negative.   Musculoskeletal: Positive for arthralgias.  Psychiatric/Behavioral: Positive for sleep disturbance.    Vitals:   04/23/18 0928  BP: 134/82  Pulse: 74  SpO2: 93%     Physical Exam  Constitutional: She is oriented to person, place, and time. She appears well-developed and well-nourished.  Morbidly obese  HENT:  Head: Normocephalic and atraumatic.  Crowded oropharynx Mallampati 3  Eyes: Pupils are equal, round, and reactive to light. Conjunctivae are normal. Right eye exhibits no discharge. Left eye exhibits no discharge.  Neck: Normal range of motion. Neck supple. No tracheal deviation present. No thyromegaly present.  Cardiovascular: Normal rate and regular rhythm.  Pulmonary/Chest: Effort normal and breath sounds normal. No respiratory  distress. She has no wheezes.  Abdominal: Soft.  Musculoskeletal: Normal range of motion. She exhibits edema.  Neurological: She is alert and oriented to person, place, and time. No cranial nerve deficit.  Skin: Skin is warm and dry. No erythema.  Psychiatric: She has a normal mood and affect.   Assessment:  History of significant sleep  Morbid obesity  Deconditioning  Possible obesity hypoventilation  Plan/Recommendations:  We will order an in lab study as this will be appropriate for somebody was already on oxygen  Ambulatory oximetry performed today-this reveals she needs about 3 L of oxygen with activity  Pathophysiology of sleep disordered breathing discussed with the patient  Treatment options discussed with the patient  Importance of regular exercise and weight loss discussed with the patient  Sleep hygiene counseling provided  I will see her back in the office in about 3 months   Sherrilyn Rist MD [facility] 04/23/2018, 10:06 AM  CC: Seward Carol, MD

## 2018-04-23 NOTE — Progress Notes (Signed)
SIX MIN WALK 04/23/2018  Medications Norvasc 2.5mg , coreg 25mg , zyrtec 10mg , nexium 40mg , lasix 20mg , amaryl 2mg , norco 10-325mg , prinivil 40mg , antivert 25, robaxin 750mg , multivitamin, pravachol 40mg , januvia 100mg , and tramadol 50mg .  Supplimental Oxygen during Test? (L/min) Yes  O2 Flow Rate 3  Type Continuous  Laps 1  Partial Lap (in Meters) 3  Baseline BP (sitting) 130/82  Baseline Heartrate 74  Baseline Dyspnea (Borg Scale) 8  Baseline Fatigue (Borg Scale) 8  Baseline SPO2 93  BP (sitting) 134/88  Heartrate 78  Dyspnea (Borg Scale) 10  Fatigue (Borg Scale) 10  SPO2 97  BP (sitting) 122/82  Heartrate 70  SPO2 98  Stopped or Paused before Six Minutes Yes  Other Symptoms at end of Exercise patient was too tired to complete the 37min walk, she was SOB and hurting in her back and legs.  Interpretation Hip pain;Leg pain  Distance Completed 51  Tech Comments: Patient walked half a lap at a normal pace rate for patient with cane and dropped O3 sats to 71%, placed pt on O2 at 3L. Pt remained using O2 at 3: for remaining of the walk above 95%.

## 2018-04-26 NOTE — Telephone Encounter (Signed)
PCCM:  Agree needs orders for O2 from DME  Thanks  Garner Nash, DO [facility] 04/26/2018 11:09 AM

## 2018-04-26 NOTE — Telephone Encounter (Signed)
Called and spoke with patient regarding BI recommendations below. Advised that BI recommends remaining on 3L O2 con't with exertion Placed order to [facility] for 3L of O2 to re-qualify pt for O2 Pt had question regarding HST vs In Lab sleep study Explained to pt that OL is requesting in lab sleep study more appropriate for pt Pt verbalized understanding, had no further questions. Nothing further needed.

## 2018-04-30 DIAGNOSIS — J9611 Chronic respiratory failure with hypoxia: Secondary | ICD-10-CM | POA: Diagnosis not present

## 2018-05-06 DIAGNOSIS — G4733 Obstructive sleep apnea (adult) (pediatric): Secondary | ICD-10-CM | POA: Diagnosis not present

## 2018-05-06 DIAGNOSIS — M5442 Lumbago with sciatica, left side: Secondary | ICD-10-CM | POA: Diagnosis not present

## 2018-05-06 DIAGNOSIS — J449 Chronic obstructive pulmonary disease, unspecified: Secondary | ICD-10-CM | POA: Diagnosis not present

## 2018-05-06 DIAGNOSIS — R252 Cramp and spasm: Secondary | ICD-10-CM | POA: Diagnosis not present

## 2018-05-06 DIAGNOSIS — R0601 Orthopnea: Secondary | ICD-10-CM | POA: Diagnosis not present

## 2018-05-06 DIAGNOSIS — R0609 Other forms of dyspnea: Secondary | ICD-10-CM | POA: Diagnosis not present

## 2018-05-30 DIAGNOSIS — J9611 Chronic respiratory failure with hypoxia: Secondary | ICD-10-CM | POA: Diagnosis not present

## 2018-06-05 DIAGNOSIS — G4733 Obstructive sleep apnea (adult) (pediatric): Secondary | ICD-10-CM | POA: Diagnosis not present

## 2018-06-05 DIAGNOSIS — R252 Cramp and spasm: Secondary | ICD-10-CM | POA: Diagnosis not present

## 2018-06-05 DIAGNOSIS — R0601 Orthopnea: Secondary | ICD-10-CM | POA: Diagnosis not present

## 2018-06-05 DIAGNOSIS — R0609 Other forms of dyspnea: Secondary | ICD-10-CM | POA: Diagnosis not present

## 2018-06-05 DIAGNOSIS — M5442 Lumbago with sciatica, left side: Secondary | ICD-10-CM | POA: Diagnosis not present

## 2018-06-05 DIAGNOSIS — J449 Chronic obstructive pulmonary disease, unspecified: Secondary | ICD-10-CM | POA: Diagnosis not present

## 2018-06-11 ENCOUNTER — Ambulatory Visit (HOSPITAL_BASED_OUTPATIENT_CLINIC_OR_DEPARTMENT_OTHER): Payer: Medicare HMO | Attending: Pulmonary Disease

## 2018-06-29 ENCOUNTER — Encounter (HOSPITAL_BASED_OUTPATIENT_CLINIC_OR_DEPARTMENT_OTHER): Payer: Medicare HMO

## 2018-06-30 ENCOUNTER — Encounter (HOSPITAL_BASED_OUTPATIENT_CLINIC_OR_DEPARTMENT_OTHER): Payer: Medicare HMO

## 2018-07-19 DIAGNOSIS — J9611 Chronic respiratory failure with hypoxia: Secondary | ICD-10-CM | POA: Diagnosis not present

## 2018-07-19 DIAGNOSIS — J449 Chronic obstructive pulmonary disease, unspecified: Secondary | ICD-10-CM | POA: Diagnosis not present

## 2018-07-19 DIAGNOSIS — E1122 Type 2 diabetes mellitus with diabetic chronic kidney disease: Secondary | ICD-10-CM | POA: Diagnosis not present

## 2018-07-19 DIAGNOSIS — I10 Essential (primary) hypertension: Secondary | ICD-10-CM | POA: Diagnosis not present

## 2018-07-19 DIAGNOSIS — G8929 Other chronic pain: Secondary | ICD-10-CM | POA: Diagnosis not present

## 2018-07-19 DIAGNOSIS — N189 Chronic kidney disease, unspecified: Secondary | ICD-10-CM | POA: Diagnosis not present

## 2018-07-19 DIAGNOSIS — G4733 Obstructive sleep apnea (adult) (pediatric): Secondary | ICD-10-CM | POA: Diagnosis not present

## 2018-07-19 DIAGNOSIS — E7800 Pure hypercholesterolemia, unspecified: Secondary | ICD-10-CM | POA: Diagnosis not present

## 2018-07-31 DIAGNOSIS — J9611 Chronic respiratory failure with hypoxia: Secondary | ICD-10-CM | POA: Diagnosis not present

## 2018-08-02 ENCOUNTER — Other Ambulatory Visit (HOSPITAL_COMMUNITY): Payer: Self-pay | Admitting: Nurse Practitioner

## 2018-08-02 ENCOUNTER — Other Ambulatory Visit: Payer: Self-pay | Admitting: Nurse Practitioner

## 2018-08-02 DIAGNOSIS — E1122 Type 2 diabetes mellitus with diabetic chronic kidney disease: Secondary | ICD-10-CM | POA: Diagnosis not present

## 2018-08-02 DIAGNOSIS — R1031 Right lower quadrant pain: Secondary | ICD-10-CM

## 2018-08-02 DIAGNOSIS — J449 Chronic obstructive pulmonary disease, unspecified: Secondary | ICD-10-CM

## 2018-08-02 DIAGNOSIS — N184 Chronic kidney disease, stage 4 (severe): Secondary | ICD-10-CM

## 2018-08-02 DIAGNOSIS — N189 Chronic kidney disease, unspecified: Secondary | ICD-10-CM | POA: Diagnosis not present

## 2018-08-02 DIAGNOSIS — E0822 Diabetes mellitus due to underlying condition with diabetic chronic kidney disease: Secondary | ICD-10-CM | POA: Diagnosis not present

## 2018-08-04 ENCOUNTER — Ambulatory Visit (HOSPITAL_COMMUNITY): Payer: Medicare HMO

## 2018-08-06 ENCOUNTER — Ambulatory Visit (HOSPITAL_COMMUNITY): Payer: Medicare HMO

## 2018-08-11 ENCOUNTER — Ambulatory Visit (HOSPITAL_COMMUNITY)
Admission: RE | Admit: 2018-08-11 | Discharge: 2018-08-11 | Disposition: A | Discharge status: Home / Self Care | Payer: Medicare HMO | Source: Ambulatory Visit | Attending: Pulmonary Disease | Admitting: Pulmonary Disease

## 2018-08-11 ENCOUNTER — Ambulatory Visit (HOSPITAL_BASED_OUTPATIENT_CLINIC_OR_DEPARTMENT_OTHER): Payer: Medicare HMO | Admitting: Pulmonary Disease

## 2018-08-11 VITALS — Ht 61.0 in | Wt 290.0 lb

## 2018-08-11 DIAGNOSIS — R1031 Right lower quadrant pain: Secondary | ICD-10-CM | POA: Diagnosis not present

## 2018-08-11 DIAGNOSIS — R9389 Abnormal findings on diagnostic imaging of other specified body structures: Secondary | ICD-10-CM | POA: Diagnosis not present

## 2018-08-11 DIAGNOSIS — G4733 Obstructive sleep apnea (adult) (pediatric): Secondary | ICD-10-CM | POA: Insufficient documentation

## 2018-08-11 DIAGNOSIS — K8020 Calculus of gallbladder without cholecystitis without obstruction: Secondary | ICD-10-CM | POA: Insufficient documentation

## 2018-08-11 DIAGNOSIS — K5730 Diverticulosis of large intestine without perforation or abscess without bleeding: Secondary | ICD-10-CM | POA: Diagnosis not present

## 2018-08-11 DIAGNOSIS — N6320 Unspecified lump in the left breast, unspecified quadrant: Secondary | ICD-10-CM | POA: Diagnosis not present

## 2018-08-11 DIAGNOSIS — N184 Chronic kidney disease, stage 4 (severe): Secondary | ICD-10-CM | POA: Insufficient documentation

## 2018-08-11 DIAGNOSIS — N189 Chronic kidney disease, unspecified: Secondary | ICD-10-CM | POA: Diagnosis not present

## 2018-08-11 DIAGNOSIS — K55069 Acute infarction of intestine, part and extent unspecified: Secondary | ICD-10-CM | POA: Insufficient documentation

## 2018-08-11 DIAGNOSIS — J449 Chronic obstructive pulmonary disease, unspecified: Secondary | ICD-10-CM | POA: Diagnosis not present

## 2018-08-11 DIAGNOSIS — E1122 Type 2 diabetes mellitus with diabetic chronic kidney disease: Secondary | ICD-10-CM | POA: Insufficient documentation

## 2018-08-11 DIAGNOSIS — R0902 Hypoxemia: Secondary | ICD-10-CM | POA: Insufficient documentation

## 2018-08-13 ENCOUNTER — Telehealth: Payer: Self-pay | Admitting: Pulmonary Disease

## 2018-08-13 ENCOUNTER — Other Ambulatory Visit: Payer: Self-pay | Admitting: Nurse Practitioner

## 2018-08-13 DIAGNOSIS — K388 Other specified diseases of appendix: Secondary | ICD-10-CM | POA: Diagnosis not present

## 2018-08-13 DIAGNOSIS — K8020 Calculus of gallbladder without cholecystitis without obstruction: Secondary | ICD-10-CM | POA: Diagnosis not present

## 2018-08-13 DIAGNOSIS — N6320 Unspecified lump in the left breast, unspecified quadrant: Secondary | ICD-10-CM

## 2018-08-13 DIAGNOSIS — G4733 Obstructive sleep apnea (adult) (pediatric): Secondary | ICD-10-CM

## 2018-08-13 DIAGNOSIS — R9389 Abnormal findings on diagnostic imaging of other specified body structures: Secondary | ICD-10-CM

## 2018-08-13 DIAGNOSIS — N63 Unspecified lump in unspecified breast: Secondary | ICD-10-CM | POA: Diagnosis not present

## 2018-08-13 NOTE — Procedures (Signed)
POLYSOMNOGRAPHY  Last, First: Kimberly Howell, [place] MRN: [id number] Gender: Female Age (years): 73 Weight (lbs): 290 DOB: 25-Jul-1945 BMI: 57 Primary Care: [facility] Epworth Score: 7 Referring: Laurin Coder MD Technician: Jacklynn Bue Interpreting: Laurin Coder MD Study Type: NPSG Ordered Study Type: Split Night CPAP Study date: 08/11/2018 Location: [place] CLINICAL INFORMATION Kimberly Howell is a 73 year old Female and was referred to the sleep center for evaluation of G47.33 OSA: Adult and Pediatric (327.23). Indications include Diabetes, Morbid Obesity, Snoring, Witnesses Apnea / Gasping During Sleep.  MEDICATIONS Patient self administered medications include: N/A. Medications administered during study include No sleep medicine administered.  SLEEP STUDY TECHNIQUE A multi-channel overnight Polysomnography study was performed. The channels recorded and monitored were central and occipital EEG, electrooculogram (EOG), submentalis EMG (chin), nasal and oral airflow, thoracic and abdominal wall motion, anterior tibialis EMG, snore microphone, electrocardiogram, and a pulse oximetry. TECHNICIAN COMMENTS Comments added by Technician: The patient awoke with a headache and wanted to stay awake for the last 90 minutes of testing. The patient arrived wearing O2 4 LPM which was used throughout this study. Comments added by Scorer: N/A SLEEP ARCHITECTURE The study was initiated at 10:57:22 PM and terminated at 5:00:23 AM. The total recorded time was 363 minutes. EEG confirmed total sleep time was 209.5 minutes yielding a sleep efficiency of 57.7%%. Sleep onset after lights out was 13.3 minutes with a REM latency of 43.0 minutes. The patient spent 17.7%% of the night in stage N1 sleep, 39.4%% in stage N2 sleep, 17.9%% in stage N3 and 25.1% in REM. Wake after sleep onset (WASO) was 140.2 minutes. The Arousal Index was 27.5/hour. RESPIRATORY PARAMETERS There were a total of 48  respiratory disturbances out of which 44 were apneas ( 44 obstructive, 0 mixed, 0 central) and 4 hypopneas. The apnea/hypopnea index (AHI) was 13.7 events/hour. The central sleep apnea index was 0.0 events/hour. The REM AHI was 41.1 events/hour and NREM AHI was 4.6 events/hour. The supine AHI was 13.7 events/hour and the non supine AHI was 0 supine during 100.00% of sleep. Respiratory disturbances were associated with oxygen desaturation down to a nadir of 71.0% during sleep. The mean oxygen saturation during the study was 96.2%. The cumulative time under 88% oxygen saturation was 5.5 minutes.  LEG MOVEMENT DATA The total leg movements were 0 with a resulting leg movement index of 0.0/hr .Associated arousal with leg movement index was 0.0/hr.  CARDIAC DATA The underlying cardiac rhythm was most consistent with sinus rhythm. Mean heart rate during sleep was 68.3 bpm. Additional rhythm abnormalities include PVCs. IMPRESSIONS - Mild Obstructive Sleep apnea(OSA) - Electrocardiographic data showed presence of PVCs. - Mild Oxygen Desaturation - The patient snored with loud snoring volume. - No significant periodic leg movements(PLMs) during sleep. No significant associated arousals.   DIAGNOSIS - Obstructive Sleep Apnea (327.23 [G47.33 ICD-10]) - Nocturnal Hypoxemia (327.26 [G47.36 ICD-10])   RECOMMENDATIONS - Therapeutic CPAP titration to determine optimal pressure required to alleviate sleep disordered breathing. - Auto-titrating cpap with pressure settings of 5-15 may be tried for OSA with close clinical follow-up. - Positional therapy avoiding supine position during sleep. - Avoid alcohol, sedatives and other CNS depressants that may worsen sleep apnea and disrupt normal sleep architecture. - Sleep hygiene should be reviewed to assess factors that may improve sleep quality. - Weight management and regular exercise should be initiated or continued.  [Electronically signed] 08/13/2018 06:10  AM  Sherrilyn Rist MD NPI: [id number]

## 2018-08-13 NOTE — Telephone Encounter (Signed)
Sleep study result  Date of study: 08/11/2018  Recommendation:  Auto-titrating cpap with pressure settings of 5-15 may be tried for OSA with close clinical follow-up.  Follow-up in the office within 4 to 6 weeks after set up with CPAP therapy

## 2018-08-20 NOTE — Telephone Encounter (Signed)
Patient  Is aware of results and would like to get cpap but cant schedule follow up at this time will call back

## 2018-08-23 ENCOUNTER — Ambulatory Visit
Admission: RE | Admit: 2018-08-23 | Discharge: 2018-08-23 | Disposition: A | Discharge status: Home / Self Care | Payer: Medicare HMO | Source: Ambulatory Visit | Attending: Nurse Practitioner | Admitting: Nurse Practitioner

## 2018-08-23 ENCOUNTER — Ambulatory Visit: Payer: Medicare HMO

## 2018-08-23 DIAGNOSIS — R928 Other abnormal and inconclusive findings on diagnostic imaging of breast: Secondary | ICD-10-CM | POA: Diagnosis not present

## 2018-08-23 DIAGNOSIS — N6320 Unspecified lump in the left breast, unspecified quadrant: Secondary | ICD-10-CM

## 2018-08-24 ENCOUNTER — Ambulatory Visit
Admission: RE | Admit: 2018-08-24 | Discharge: 2018-08-24 | Disposition: A | Discharge status: Home / Self Care | Payer: Medicare HMO | Source: Ambulatory Visit | Attending: Nurse Practitioner | Admitting: Nurse Practitioner

## 2018-08-24 DIAGNOSIS — R9389 Abnormal findings on diagnostic imaging of other specified body structures: Secondary | ICD-10-CM

## 2018-08-24 DIAGNOSIS — D251 Intramural leiomyoma of uterus: Secondary | ICD-10-CM | POA: Diagnosis not present

## 2018-08-29 DIAGNOSIS — J9611 Chronic respiratory failure with hypoxia: Secondary | ICD-10-CM | POA: Diagnosis not present

## 2018-08-30 DIAGNOSIS — R935 Abnormal findings on diagnostic imaging of other abdominal regions, including retroperitoneum: Secondary | ICD-10-CM | POA: Diagnosis not present

## 2018-08-30 DIAGNOSIS — K388 Other specified diseases of appendix: Secondary | ICD-10-CM | POA: Diagnosis not present

## 2018-08-30 DIAGNOSIS — E669 Obesity, unspecified: Secondary | ICD-10-CM | POA: Diagnosis not present

## 2018-08-30 DIAGNOSIS — E1169 Type 2 diabetes mellitus with other specified complication: Secondary | ICD-10-CM | POA: Diagnosis not present

## 2018-08-30 DIAGNOSIS — G4733 Obstructive sleep apnea (adult) (pediatric): Secondary | ICD-10-CM | POA: Diagnosis not present

## 2018-08-31 ENCOUNTER — Telehealth: Payer: Self-pay | Admitting: Pulmonary Disease

## 2018-08-31 DIAGNOSIS — R0609 Other forms of dyspnea: Principal | ICD-10-CM

## 2018-08-31 DIAGNOSIS — J9601 Acute respiratory failure with hypoxia: Secondary | ICD-10-CM

## 2018-08-31 NOTE — Telephone Encounter (Signed)
Spoke with [facility].  They needed to know if the pt should have oxygen bled through the cpap and what liter flow.  Dr Ander Slade please advise.

## 2018-09-01 DIAGNOSIS — I129 Hypertensive chronic kidney disease with stage 1 through stage 4 chronic kidney disease, or unspecified chronic kidney disease: Secondary | ICD-10-CM | POA: Diagnosis not present

## 2018-09-01 DIAGNOSIS — E1122 Type 2 diabetes mellitus with diabetic chronic kidney disease: Secondary | ICD-10-CM | POA: Diagnosis not present

## 2018-09-01 DIAGNOSIS — E669 Obesity, unspecified: Secondary | ICD-10-CM | POA: Diagnosis not present

## 2018-09-01 DIAGNOSIS — N183 Chronic kidney disease, stage 3 (moderate): Secondary | ICD-10-CM | POA: Diagnosis not present

## 2018-09-01 DIAGNOSIS — M109 Gout, unspecified: Secondary | ICD-10-CM | POA: Diagnosis not present

## 2018-09-01 NOTE — Telephone Encounter (Signed)
2L piped in to system  Obtain nocturnal oximetry on CPAP and oxygen to ascertain adequate supplementation

## 2018-09-01 NOTE — Telephone Encounter (Signed)
lmtcb

## 2018-09-02 NOTE — Telephone Encounter (Signed)
Called and spoke with [name] at [facility] stating to him that AO does want then to walk pt to see what is required so we can then send an Rx based on the ambulatory requirement.   After stating that to [facility], he wants to know what percentage range for pt's sats would AO be comfortable with pt being at knowing that the POC is pulsed flow. That way they can know when they need to place pt on the O2 and also they know at what point they need to bump the pulse dose up from 2 to 3 and so forth.  Dr. Ander Slade, please advise on this. Thanks!

## 2018-09-02 NOTE — Telephone Encounter (Signed)
Goal of 89 and above

## 2018-09-02 NOTE — Telephone Encounter (Signed)
Lets have DME walk her to see what is required and we can send a prescription based on ambulatory requirement

## 2018-09-02 NOTE — Telephone Encounter (Signed)
Called Kimberly Howell unable to reach [facility]

## 2018-09-02 NOTE — Telephone Encounter (Signed)
Called Kimberly Howell.  Dr Ander Slade recommendations given. [name] stated patient has been using 3-4 liters O2, complaining of being [facility].  [name] stated, with the order, [facility] can titrate, keep sats above what Dr Ander Slade recommends, and send someone out to walk the patient on O2.   Called Patient. Patient stated she uses 4 liters oxygen. Patient stated her POC only goes up to 3 liters, so she uses 2 liters with it. Patient denies any SHOB at this time.  Message routed to Dr Ander Slade to advise

## 2018-09-03 NOTE — Telephone Encounter (Signed)
Called and spoke with Kimberly Howell from [facility] stating to him that AO said to have pt's sats be 35 and above when they titrate her for a POC. Kimberly Howell expressed understanding and stated to me to place that in an order for them so they could see that and he also asked if I could place in there for them to do medication education for pt as he thinks she is not doing her neb meds as she is supposed to be doing.  I expressed understanding and stated I would place the order with that info. Order has been placed. Nothing further needed.

## 2018-09-04 DIAGNOSIS — R252 Cramp and spasm: Secondary | ICD-10-CM | POA: Diagnosis not present

## 2018-09-04 DIAGNOSIS — G4733 Obstructive sleep apnea (adult) (pediatric): Secondary | ICD-10-CM | POA: Diagnosis not present

## 2018-09-04 DIAGNOSIS — M5442 Lumbago with sciatica, left side: Secondary | ICD-10-CM | POA: Diagnosis not present

## 2018-09-16 DIAGNOSIS — G8929 Other chronic pain: Secondary | ICD-10-CM | POA: Diagnosis not present

## 2018-09-16 DIAGNOSIS — G2581 Restless legs syndrome: Secondary | ICD-10-CM | POA: Diagnosis not present

## 2018-09-16 DIAGNOSIS — R9389 Abnormal findings on diagnostic imaging of other specified body structures: Secondary | ICD-10-CM | POA: Diagnosis not present

## 2018-09-16 DIAGNOSIS — F419 Anxiety disorder, unspecified: Secondary | ICD-10-CM | POA: Diagnosis not present

## 2018-09-16 DIAGNOSIS — N183 Chronic kidney disease, stage 3 (moderate): Secondary | ICD-10-CM | POA: Diagnosis not present

## 2018-09-16 DIAGNOSIS — R935 Abnormal findings on diagnostic imaging of other abdominal regions, including retroperitoneum: Secondary | ICD-10-CM | POA: Diagnosis not present

## 2018-09-24 ENCOUNTER — Telehealth: Payer: Self-pay | Admitting: Cardiology

## 2018-09-24 NOTE — Telephone Encounter (Signed)
Good Afternoon,   Spoke with Kimberly Howell and she is set up for mychart. Email address has been updated. She does have a smart phone and tablet.

## 2018-09-27 DIAGNOSIS — R9389 Abnormal findings on diagnostic imaging of other specified body structures: Secondary | ICD-10-CM | POA: Diagnosis not present

## 2018-09-27 DIAGNOSIS — Z9181 History of falling: Secondary | ICD-10-CM | POA: Diagnosis not present

## 2018-09-27 DIAGNOSIS — G2581 Restless legs syndrome: Secondary | ICD-10-CM | POA: Diagnosis not present

## 2018-09-29 DIAGNOSIS — J9611 Chronic respiratory failure with hypoxia: Secondary | ICD-10-CM | POA: Diagnosis not present

## 2018-09-30 DIAGNOSIS — R9389 Abnormal findings on diagnostic imaging of other specified body structures: Secondary | ICD-10-CM | POA: Insufficient documentation

## 2018-09-30 DIAGNOSIS — G4733 Obstructive sleep apnea (adult) (pediatric): Secondary | ICD-10-CM | POA: Diagnosis not present

## 2018-10-01 ENCOUNTER — Ambulatory Visit: Payer: Medicare HMO | Admitting: Cardiology

## 2018-10-01 ENCOUNTER — Other Ambulatory Visit: Payer: Self-pay

## 2018-10-01 ENCOUNTER — Encounter: Payer: Self-pay | Admitting: Internal Medicine

## 2018-10-01 ENCOUNTER — Telehealth (INDEPENDENT_AMBULATORY_CARE_PROVIDER_SITE_OTHER): Payer: Medicare HMO | Admitting: Internal Medicine

## 2018-10-01 ENCOUNTER — Telehealth: Payer: Self-pay | Admitting: Internal Medicine

## 2018-10-01 DIAGNOSIS — R0600 Dyspnea, unspecified: Secondary | ICD-10-CM | POA: Diagnosis not present

## 2018-10-01 DIAGNOSIS — I10 Essential (primary) hypertension: Secondary | ICD-10-CM | POA: Diagnosis not present

## 2018-10-01 DIAGNOSIS — E782 Mixed hyperlipidemia: Secondary | ICD-10-CM | POA: Diagnosis not present

## 2018-10-01 NOTE — Telephone Encounter (Signed)
Verbal consent obtained from patient for appt. 10/01/2018 at 11:00 am. Pt. Was instructed to have bp,heart rate and weight available if needed.  YOUR CARDIOLOGY TEAM HAS ARRANGED FOR AN E-VISIT FOR YOUR APPOINTMENT - PLEASE REVIEW IMPORTANT INFORMATION BELOW SEVERAL DAYS PRIOR TO YOUR APPOINTMENT  Due to the recent COVID-19 pandemic, we are transitioning in-person office visits to tele-medicine visits in an effort to decrease unnecessary exposure to our patients, their families, and staff. Medicare and most insurances are covering these visits without a copay needed. We also encourage you to sign up for MyChart if you have not already done so. You will need a smartphone if possible. For patients that do not have this, we can still complete the visit using a regular telephone but do prefer a smartphone to enable video when possible. You may have a family member that lives with you that can help. If possible, we also ask that you have a blood pressure cuff and scale at home to measure your blood pressure, heart rate and weight prior to your scheduled appointment. Patients with clinical needs that need an in-person evaluation and testing will still be able to come to the office if absolutely necessary. If you have any questions, feel free to call our office.     YOUR PROVIDER WILL BE USING THE FOLLOWING PLATFORM TO COMPLETE YOUR VISIT:  YES   IF USING [place] - How to Download the WebEx App to Your SmartPhone  - If Apple device, go to [employer] and type in WebEx in the search bar. [employer], the blue/green circle. If Android, go to [employer] and type in [employer] in the search bar. The app is free but as with any other app download, your phone may require you to verify saved payment information or Apple/Android password.  - You do NOT have to create an account. - On the day of the visit, our staff will walk you through joining the meeting with the meeting number/password.   IF  USING MYCHART - How to Download the MyChart App to Your SmartPhone   - If Apple, go to [employer] and type in MyChart in the search bar and download the app. If Android, ask patient to go to [employer] and type in [employer] in the search bar and download the app. The app is free but as with any other app downloads, your phone may require you to verify saved payment information or Apple/Android password.  - You will need to then log into the app with your MyChart username and password, and select [facility] as your healthcare provider to link the account. When it is time for your visit, go to the MyChart app, find appointments, and click Begin Video Visit. Be sure to Select Allow for your device to access the Microphone and Camera for your visit. You will then be connected, and your provider will be with you shortly.  **If you have any issues connecting or need assistance, please contact MyChart service desk [phone])**  **If using a computer, in order to ensure the best quality for your visit, you will need to use either of the following Internet Browsers: [employer], or Google Chrome**   IF USING DOXIMITY or [email or web address].ME - The staff will give you instructions on receiving your link to join the meeting the day of your visit.      2-3 DAYS BEFORE YOUR APPOINTMENT  You will receive a telephone call from one of our [facility]  team members - your caller ID may say "Unknown caller." If this is a video visit, we will walk you through how to set up your device to be able to complete the visit. We will remind you check your blood pressure, heart rate and weight prior to your scheduled appointment. If you have an Apple Watch or Kardia, please upload any pertinent ECG strips the day before or morning of your appointment to [facility]. Our staff will also make sure you have reviewed the consent and agree to move forward with your scheduled tele-health visit.     THE DAY OF YOUR  APPOINTMENT  Approximately 15 minutes prior to your scheduled appointment, you will receive a telephone call from one of [facility] team - your caller ID may say "Unknown caller."  Our staff will confirm medications, vital signs for the day and any symptoms you may be experiencing. Please have this information available prior to the time of visit start. It may also be helpful for you to have a pad of paper and pen handy for any instructions given during your visit. They will also walk you through joining the smartphone meeting if this is a video visit.    CONSENT FOR TELE-HEALTH VISIT - PLEASE REVIEW  I hereby voluntarily request, consent and authorize [facility] and its employed or contracted physicians, physician assistants, nurse practitioners or other licensed health care professionals (the Practitioner), to provide me with telemedicine health care services (the Services") as deemed necessary by the treating Practitioner. I acknowledge and consent to receive the Services by the Practitioner via telemedicine. I understand that the telemedicine visit will involve communicating with the Practitioner through live audiovisual communication technology and the disclosure of certain medical information by electronic transmission. I acknowledge that I have been given the opportunity to request an in-person assessment or other available alternative prior to the telemedicine visit and am voluntarily participating in the telemedicine visit.  I understand that I have the right to withhold or withdraw my consent to the use of telemedicine in the course of my care at any time, without affecting my right to future care or treatment, and that the Practitioner or I may terminate the telemedicine visit at any time. I understand that I have the right to inspect all information obtained and/or recorded in the course of the telemedicine visit and may receive copies of available information for a reasonable fee.  I  understand that some of the potential risks of receiving the Services via telemedicine include:   Delay or interruption in medical evaluation due to technological equipment failure or disruption;  Information transmitted may not be sufficient (e.g. poor resolution of images) to allow for appropriate medical decision making by the Practitioner; and/or   In rare instances, security protocols could fail, causing a breach of personal health information.  Furthermore, I acknowledge that it is my responsibility to provide information about my medical history, conditions and care that is complete and accurate to the best of my ability. I acknowledge that Practitioner's advice, recommendations, and/or decision may be based on factors not within their control, such as incomplete or inaccurate data provided by me or distortions of diagnostic images or specimens that may result from electronic transmissions. I understand that the practice of medicine is not an exact science and that Practitioner makes no warranties or guarantees regarding treatment outcomes. I acknowledge that I will receive a copy of this consent concurrently upon execution via email to the email address I last provided but may  also request a printed copy by calling the office of [facility].    I understand that my insurance will be billed for this visit.   I have read or had this consent read to me.  I understand the contents of this consent, which adequately explains the benefits and risks of the Services being provided via telemedicine.   I have been provided ample opportunity to ask questions regarding this consent and the Services and have had my questions answered to my satisfaction.  I give my informed consent for the services to be provided through the use of telemedicine in my medical care  By participating in this telemedicine visit I agree to the above.

## 2018-10-01 NOTE — Progress Notes (Signed)
Virtual Visit via Telephone Note   This visit type was conducted due to national recommendations for restrictions regarding the COVID-19 Pandemic (e.g. social distancing) in an effort to limit this patient's exposure and mitigate transmission in our community.  Due to her co-morbid illnesses, this patient is at least at moderate risk for complications without adequate follow up.  This format is felt to be most appropriate for this patient at this time.  The patient did not have access to video technology/had technical difficulties with video requiring transitioning to audio format only (telephone).  All issues noted in this document were discussed and addressed.  No physical exam could be performed with this format.  Please refer to the patient's chart for her  consent to telehealth for [facility].   Evaluation Performed:  Follow-up visit  Date:  10/01/2018   ID:  Kimberly Howell, DOB 1945/07/31, MRN [id number]  Patient Location: Home Provider Location: Home  PCP:  Seward Carol, MD  Cardiologist:  New Chief Complaint:    History of Present Illness:    Kimberly Howell is a 73 y.o. female with with a history of HTN, HL, morbid obesity, hypoventilation syndrom, CKD Stage III and Sleep apnea   SHe is followed by Dr Delfina Redwood  She is referred today by Gen Surgery for preop cardiac clearance  Pt with CT showing appendix mucocele  The pt has no known CAD   SHe has signif lung dz and is relatively sedentary    SHe denies CP   Does get SOB with activity   On O2 at home  Currently she denies abdominal pain   No f/C    The patient does not have symptoms concerning for COVID-19 infection (fever, chills, cough, or new shortness of breath).    Past Medical History:  Diagnosis Date  . Abdominal mass   . Acute kidney injury superimposed on chronic kidney disease ([place]) 03/11/2018  . Acute respiratory failure with hypoxia ([place]) 03/10/2018  . Appendix disease   . At high risk for falls   .  Breast mass   . Chronic pain   . Chronic respiratory failure with hypoxia ([place])   . CKD stage 3 due to type 2 diabetes mellitus ([place])   . COPD (chronic obstructive pulmonary disease) ([place])   . Elevated d-dimer 03/11/2018  . Endometrial thickening on ultrasound   . Gout   . HTN (hypertension)   . Hyperlipidemia 03/11/2018  . Leukocytosis 03/11/2018  . Morbid obesity with BMI of 50.0-59.9, adult ([place]) 03/11/2018  . Non-insulin dependent type 2 diabetes mellitus ([place])   . Occasional tremors   . OSA (obstructive sleep apnea)   . Overweight   . RLS (restless legs syndrome)   . Type 2 diabetes mellitus with hyperlipidemia ([place]) 03/11/2018   Past Surgical History:  Procedure Laterality Date  . DILATION AND CURETTAGE, DIAGNOSTIC / THERAPEUTIC    . KNEE SURGERY Bilateral   . TUBAL LIGATION  1979     Current Meds  Medication Sig  . amLODipine (NORVASC) 2.5 MG tablet Take 2.5 mg by mouth daily.  . ASPIRIN 81 PO Take 81 mg by mouth daily.  . carvedilol (COREG) 25 MG tablet Take 25 mg by mouth 2 (two) times daily.  Marland Kitchen COLCRYS 0.6 MG tablet Take 0.6 mg by mouth daily.  Marland Kitchen esomeprazole (NEXIUM) 40 MG capsule Take 40 mg by mouth daily.  . Febuxostat (ULORIC) 80 MG TABS Take 80 mg by mouth. TAKE 1/2 TABLET BY MOUTH DAILY  .  furosemide (LASIX) 20 MG tablet Take 20 mg by mouth daily.  Marland Kitchen HYDROcodone-acetaminophen (NORCO) 10-325 MG tablet Take 1 tablet by mouth daily as needed for pain.  Marland Kitchen lisinopril (PRINIVIL,ZESTRIL) 40 MG tablet Take 40 mg by mouth daily.  . meclizine (ANTIVERT) 25 MG tablet Take 25 mg by mouth daily.  . methocarbamol (ROBAXIN) 750 MG tablet Take 750 mg by mouth every 8 (eight) hours as needed for muscle spasms.  . Multiple Vitamin (MULTIVITAMIN WITH MINERALS) TABS tablet Take 1 tablet by mouth daily.  . Multiple Vitamin (MULTIVITAMIN) tablet Take 1 tablet by mouth daily.  . pramipexole (MIRAPEX) 0.125 MG tablet Take 0.125 mg by mouth 3 (three) times daily.  . pravastatin  (PRAVACHOL) 40 MG tablet Take 40 mg by mouth daily.  . saxagliptin HCl (ONGLYZA) 2.5 MG TABS tablet Take 2.5 mg by mouth daily.  . traMADol (ULTRAM) 50 MG tablet Take 50-100 mg by mouth daily.   . VENTOLIN HFA 108 (90 Base) MCG/ACT inhaler Inhale 2 puffs into the lungs every 6 (six) hours as needed.  . VOLTAREN 1 % GEL Apply 2 g topically every 6 (six) hours.  Marland Kitchen ZYRTEC ALLERGY 10 MG tablet Take 10 mg by mouth daily.     Allergies:   Prednisone   Social History   Tobacco Use  . Smoking status: Never Smoker  . Smokeless tobacco: Never Used  Substance Use Topics  . Alcohol use: Not Currently  . Drug use: Not Currently     Family Hx: The patient's family history includes Alcohol abuse in her father; Anxiety disorder in her mother; Cancer - Colon in her father; Colon polyps in her father; Colonic polyp in her mother; Drug abuse in her brother; GI Bleed in her mother; Hypertension in her mother; Kidney disease in her mother; Lung cancer in her father; Prostate cancer in her father.  ROS:   Please see the history of present illness.     All other systems reviewed and are negative.   Prior CV studies:   The following studies were reviewed today: September 2019 Echo Indications:      786.05 Dyspnea.  ------------------------------------------------------------------- History:   PMH:  Chronic kidney disease Acute respiratory failure with hypoxia  Risk factors:  Diabetes mellitus. Morbidly obese. Dyslipidemia.  ------------------------------------------------------------------- Study Conclusions  - Left ventricle: The cavity size was normal. Systolic function was   normal. The estimated ejection fraction was in the range of 60%   to 65%. Wall motion was normal; there were no regional wall   motion abnormalities. The study is not technically sufficient to   allow evaluation of LV diastolic function. - Aortic valve: There was no regurgitation. - Left atrium: The atrium was  normal in size. - Right ventricle: The cavity size was normal. Wall thickness was   normal. Systolic function was normal. - Right atrium: The atrium was normal in size. - Tricuspid valve: There was no regurgitation. - Pulmonary arteries: Systolic pressure could not be accurately   estimated. - Pericardium, extracardiac: There was no pericardial effusion.  ------------------------------------------------------------------- Study data:  No prior study was available for comparison.  Study status:  Routine.  Procedure:  Patient sensitive under breast. Unable to accurately visualize LV function. The patient reported no pain pre or post test. Transthoracic echocardiography. Image quality was suboptimal. The study was technically difficult, as a result of restricted patient mobility and body habitus. Intravenous contrast (Definity) was administered.  Study completion:  There were no complications.  Transthoracic echocardiography. M-mode, complete 2D, spectral Doppler, and color Doppler. Birthdate:  Patient birthdate: April 04, 1946.  Age:  Patient is 73 yr old.  Sex:  Gender: female.    BMI: 56.4 kg/m^2.  Blood pressure:   119/83  Patient status:  Inpatient.  Study date:  Study date: 03/11/2018. Study time: 02:43 PM.  Location:  Bedside.  -------------------------------------------------------------------  ------------------------------------------------------------------- Left ventricle:  The cavity size was normal. Systolic function was normal. The estimated ejection fraction was in the range of 60% to 65%. Wall motion was normal; there were no regional wall motion abnormalities. The study is not technically sufficient to allow evaluation of LV diastolic function.  ------------------------------------------------------------------- Aortic valve:   Trileaflet; normal thickness leaflets. Mobility was not restricted.  Doppler:  Transvalvular velocity was within the normal range.  There was no stenosis. There was no regurgitation.   ------------------------------------------------------------------- Aorta:  Aortic root: The aortic root was normal in size.  ------------------------------------------------------------------- Mitral valve:   Structurally normal valve.   Mobility was not restricted.  Doppler:  Transvalvular velocity was within the normal range. There was no evidence for stenosis. There was no regurgitation.  ------------------------------------------------------------------- Left atrium:  The atrium was normal in size.  ------------------------------------------------------------------- Right ventricle:  The cavity size was normal. Wall thickness was normal. Systolic function was normal.  ------------------------------------------------------------------- Pulmonic valve:   Not visualized. Cusp separation was normal. Doppler:  Transvalvular velocity was within the normal range. There was no evidence for stenosis. There was no regurgitation.  ------------------------------------------------------------------- Tricuspid valve:   Structurally normal valve.    Doppler: Transvalvular velocity was within the normal range. There was no regurgitation.  ------------------------------------------------------------------- Pulmonary artery:   The main pulmonary artery was normal-sized. Systolic pressure could not be accurately estimated.  ------------------------------------------------------------------- Right atrium:  The atrium was normal in size.  ------------------------------------------------------------------- Pericardium:  There was no pericardial effusion.  ------------------------------------------------------------------- Systemic veins: Inferior vena cava: Not visualized.  ------------------------------------------------------------------- Measurements   LVOT               Value  LVOT ID, S         17    mm  LVOT area           2.27  cm^2    Systemic veins     Value  Estimated CVP      3     mm Hg  Legend: (L)  and  (H)  mark values outside specified reference range.  ------------------------------------------------------------------- Prepared and Electronically Authenticated by  Ena Dawley, M.D. \  Labs/Other Tests and Data Reviewed:    EKG:  EKG not done as televisit On 03/11/18  SR 85 bpm    Recent Labs: 03/10/2018: Magnesium 2.3 03/11/2018: BUN 45; Creatinine, Ser 2.59; Hemoglobin 10.8; Platelets 212; Potassium 4.7; Sodium 143   Recent Lipid Panel No results found for: CHOL, TRIG, HDL, CHOLHDL, LDLCALC, LDLDIRECT  Wt Readings from Last 3 Encounters:  08/11/18 290 lb (131.5 kg)  04/23/18 295 lb (133.8 kg)  03/24/18 292 lb (132.5 kg)     Objective:    Vital Signs:  BP 134/78 Comment: 125/71  Pulse 71 Comment: 78  Ht 5\' 1"  (1.549 m)   BMI 54.80 kg/m    VITAL SIGNS:  reviewed  ASSESSMENT & PLAN:    1. Preop cardiac risk stratification  The pt has risk factors for CAD   Most importantly she has documented atherosclerosis of aortoiliac vessels     She is extremely inactive   Destaurates with walking  Overall, I cannot define her cardiac risk for surgery LVEF is normal by echo last year   QUestion of timing for surgery    Pt would require Lexiscan myovue to eval for ischemia   Before being able to define risk  2   LIpids   WIll need to be defined  Get labs from Dr Delfina Redwood  Needs aggressive control   3  HTN   Fair control of BP    4  Pulmonary   On O2  5  OSA    COVID-19 Education: The signs and symptoms of COVID-19 were discussed with the patient and how to seek care for testing (follow up with PCP or arrange E-visit).  The importance of social distancing was discussed today.  Time:   Today, I have spent 20   minutes with the patient with telehealth technology discussing the above problems.     Medication Adjustments/Labs and Tests Ordered: Current medicines are  reviewed at length with the patient today.  Concerns regarding medicines are outlined above.   Tests Ordered: No orders of the defined types were placed in this encounter.   Medication Changes: No orders of the defined types were placed in this encounter.   Disposition:  Follow up based on surgery timing  Signed, Dorris Carnes, MD  10/01/2018 11:14 AM    [place]

## 2018-10-01 NOTE — Patient Instructions (Signed)
Medication Instructions:  No medicine changes today If you need a refill on your cardiac medications before your next appointment, please call your pharmacy.   Lab work: None today If you have labs (blood work) drawn today and your tests are completely normal, you will receive your results only by: Marland Kitchen MyChart Message (if you have MyChart) OR . A paper copy in the mail If you have any lab test that is abnormal or we need to change your treatment, we will call you to review the results.  Testing/Procedures: None  Follow-Up:Any Other Special Instructions Will Be Listed Below (If Applicable).  Per Dr Harrington Challenger we will discuss any testing when your surgical work up is completed and it is time for cardiac clearance to be determined.

## 2018-10-05 DIAGNOSIS — G4733 Obstructive sleep apnea (adult) (pediatric): Secondary | ICD-10-CM | POA: Diagnosis not present

## 2018-10-05 DIAGNOSIS — M5442 Lumbago with sciatica, left side: Secondary | ICD-10-CM | POA: Diagnosis not present

## 2018-10-05 DIAGNOSIS — R252 Cramp and spasm: Secondary | ICD-10-CM | POA: Diagnosis not present

## 2018-10-18 ENCOUNTER — Encounter: Payer: Self-pay | Admitting: Internal Medicine

## 2018-10-18 ENCOUNTER — Encounter (HOSPITAL_COMMUNITY): Payer: Self-pay | Admitting: *Deleted

## 2018-10-18 ENCOUNTER — Emergency Department (HOSPITAL_COMMUNITY): Payer: Medicare HMO

## 2018-10-18 ENCOUNTER — Observation Stay (HOSPITAL_COMMUNITY): Payer: Medicare HMO

## 2018-10-18 ENCOUNTER — Observation Stay (HOSPITAL_COMMUNITY)
Admission: EM | Admit: 2018-10-18 | Discharge: 2018-10-19 | Disposition: A | Discharge status: Home / Self Care | Payer: Medicare HMO | Attending: Student | Admitting: Student

## 2018-10-18 DIAGNOSIS — G4733 Obstructive sleep apnea (adult) (pediatric): Secondary | ICD-10-CM

## 2018-10-18 DIAGNOSIS — E785 Hyperlipidemia, unspecified: Secondary | ICD-10-CM | POA: Diagnosis not present

## 2018-10-18 DIAGNOSIS — M6281 Muscle weakness (generalized): Secondary | ICD-10-CM | POA: Diagnosis not present

## 2018-10-18 DIAGNOSIS — R2681 Unsteadiness on feet: Secondary | ICD-10-CM | POA: Insufficient documentation

## 2018-10-18 DIAGNOSIS — I959 Hypotension, unspecified: Secondary | ICD-10-CM | POA: Diagnosis not present

## 2018-10-18 DIAGNOSIS — G9341 Metabolic encephalopathy: Principal | ICD-10-CM | POA: Insufficient documentation

## 2018-10-18 DIAGNOSIS — G2581 Restless legs syndrome: Secondary | ICD-10-CM | POA: Diagnosis not present

## 2018-10-18 DIAGNOSIS — R0902 Hypoxemia: Secondary | ICD-10-CM | POA: Diagnosis not present

## 2018-10-18 DIAGNOSIS — Z794 Long term (current) use of insulin: Secondary | ICD-10-CM | POA: Insufficient documentation

## 2018-10-18 DIAGNOSIS — Z1159 Encounter for screening for other viral diseases: Secondary | ICD-10-CM | POA: Diagnosis not present

## 2018-10-18 DIAGNOSIS — R4182 Altered mental status, unspecified: Secondary | ICD-10-CM

## 2018-10-18 DIAGNOSIS — E872 Acidosis: Secondary | ICD-10-CM | POA: Diagnosis not present

## 2018-10-18 DIAGNOSIS — Z6841 Body Mass Index (BMI) 40.0 and over, adult: Secondary | ICD-10-CM | POA: Insufficient documentation

## 2018-10-18 DIAGNOSIS — G9340 Encephalopathy, unspecified: Secondary | ICD-10-CM | POA: Diagnosis present

## 2018-10-18 DIAGNOSIS — D539 Nutritional anemia, unspecified: Secondary | ICD-10-CM

## 2018-10-18 DIAGNOSIS — N183 Chronic kidney disease, stage 3 unspecified: Secondary | ICD-10-CM | POA: Diagnosis present

## 2018-10-18 DIAGNOSIS — Z791 Long term (current) use of non-steroidal anti-inflammatories (NSAID): Secondary | ICD-10-CM | POA: Insufficient documentation

## 2018-10-18 DIAGNOSIS — Z9981 Dependence on supplemental oxygen: Secondary | ICD-10-CM | POA: Insufficient documentation

## 2018-10-18 DIAGNOSIS — I129 Hypertensive chronic kidney disease with stage 1 through stage 4 chronic kidney disease, or unspecified chronic kidney disease: Secondary | ICD-10-CM | POA: Diagnosis not present

## 2018-10-18 DIAGNOSIS — J9621 Acute and chronic respiratory failure with hypoxia: Secondary | ICD-10-CM | POA: Diagnosis not present

## 2018-10-18 DIAGNOSIS — J9610 Chronic respiratory failure, unspecified whether with hypoxia or hypercapnia: Secondary | ICD-10-CM | POA: Diagnosis present

## 2018-10-18 DIAGNOSIS — N289 Disorder of kidney and ureter, unspecified: Secondary | ICD-10-CM | POA: Diagnosis not present

## 2018-10-18 DIAGNOSIS — Z7982 Long term (current) use of aspirin: Secondary | ICD-10-CM | POA: Diagnosis not present

## 2018-10-18 DIAGNOSIS — R569 Unspecified convulsions: Secondary | ICD-10-CM | POA: Diagnosis not present

## 2018-10-18 DIAGNOSIS — R001 Bradycardia, unspecified: Secondary | ICD-10-CM | POA: Diagnosis not present

## 2018-10-18 DIAGNOSIS — Z79899 Other long term (current) drug therapy: Secondary | ICD-10-CM | POA: Insufficient documentation

## 2018-10-18 DIAGNOSIS — J9622 Acute and chronic respiratory failure with hypercapnia: Secondary | ICD-10-CM | POA: Diagnosis not present

## 2018-10-18 DIAGNOSIS — E1122 Type 2 diabetes mellitus with diabetic chronic kidney disease: Secondary | ICD-10-CM | POA: Insufficient documentation

## 2018-10-18 DIAGNOSIS — J449 Chronic obstructive pulmonary disease, unspecified: Secondary | ICD-10-CM | POA: Diagnosis not present

## 2018-10-18 DIAGNOSIS — I10 Essential (primary) hypertension: Secondary | ICD-10-CM | POA: Diagnosis present

## 2018-10-18 DIAGNOSIS — R0689 Other abnormalities of breathing: Secondary | ICD-10-CM | POA: Diagnosis not present

## 2018-10-18 DIAGNOSIS — Z9989 Dependence on other enabling machines and devices: Secondary | ICD-10-CM

## 2018-10-18 DIAGNOSIS — J9602 Acute respiratory failure with hypercapnia: Secondary | ICD-10-CM | POA: Diagnosis not present

## 2018-10-18 DIAGNOSIS — R7989 Other specified abnormal findings of blood chemistry: Secondary | ICD-10-CM

## 2018-10-18 DIAGNOSIS — R4020 Unspecified coma: Secondary | ICD-10-CM | POA: Diagnosis not present

## 2018-10-18 DIAGNOSIS — R404 Transient alteration of awareness: Secondary | ICD-10-CM | POA: Diagnosis not present

## 2018-10-18 LAB — COMPREHENSIVE METABOLIC PANEL
ALT: 24 U/L (ref 0–44)
AST: 35 U/L (ref 15–41)
Albumin: 3.1 g/dL — ABNORMAL LOW (ref 3.5–5.0)
Alkaline Phosphatase: 87 U/L (ref 38–126)
Anion gap: 15 (ref 5–15)
BUN: 26 mg/dL — ABNORMAL HIGH (ref 8–23)
CO2: 25 mmol/L (ref 22–32)
Calcium: 8.3 mg/dL — ABNORMAL LOW (ref 8.9–10.3)
Chloride: 98 mmol/L (ref 98–111)
Creatinine, Ser: 2.62 mg/dL — ABNORMAL HIGH (ref 0.44–1.00)
GFR calc Af Amer: 21 mL/min — ABNORMAL LOW (ref >60)
GFR calc non Af Amer: 18 mL/min — ABNORMAL LOW (ref >60)
Glucose, Bld: 268 mg/dL — ABNORMAL HIGH (ref 70–99)
Potassium: 5 mmol/L (ref 3.5–5.1)
Sodium: 138 mmol/L (ref 135–145)
Total Bilirubin: 0.6 mg/dL (ref 0.3–1.2)
Total Protein: 6.3 g/dL — ABNORMAL LOW (ref 6.5–8.1)

## 2018-10-18 LAB — URINALYSIS, ROUTINE W REFLEX MICROSCOPIC
Bilirubin Urine: NEGATIVE
Glucose, UA: 50 mg/dL — AB
Hgb urine dipstick: NEGATIVE
Ketones, ur: NEGATIVE mg/dL
Leukocytes,Ua: NEGATIVE
Nitrite: NEGATIVE
Protein, ur: 30 mg/dL — AB
Specific Gravity, Urine: 1.014 (ref 1.005–1.030)
pH: 5 (ref 5.0–8.0)

## 2018-10-18 LAB — APTT: aPTT: 25 seconds (ref 24–36)

## 2018-10-18 LAB — GLUCOSE, CAPILLARY
Glucose-Capillary: 67 mg/dL — ABNORMAL LOW (ref 70–99)
Glucose-Capillary: 83 mg/dL (ref 70–99)
Glucose-Capillary: 100 mg/dL — ABNORMAL HIGH (ref 70–99)
Glucose-Capillary: 54 mg/dL — ABNORMAL LOW (ref 70–99)
Glucose-Capillary: 69 mg/dL — ABNORMAL LOW (ref 70–99)
Glucose-Capillary: 99 mg/dL (ref 70–99)

## 2018-10-18 LAB — POCT I-STAT 7, (LYTES, BLD GAS, ICA,H+H)
Acid-Base Excess: 2 mmol/L (ref 0.0–2.0)
Bicarbonate: 30.5 mmol/L — ABNORMAL HIGH (ref 20.0–28.0)
Calcium, Ion: 1.17 mmol/L (ref 1.15–1.40)
HCT: 32 % — ABNORMAL LOW (ref 36.0–46.0)
Hemoglobin: 10.9 g/dL — ABNORMAL LOW (ref 12.0–15.0)
O2 Saturation: 88 %
Patient temperature: 96.2
Potassium: 4.6 mmol/L (ref 3.5–5.1)
Sodium: 137 mmol/L (ref 135–145)
TCO2: 32 mmol/L (ref 22–32)
pCO2 arterial: 63.3 mmHg — ABNORMAL HIGH (ref 32.0–48.0)
pH, Arterial: 7.284 — ABNORMAL LOW (ref 7.350–7.450)
pO2, Arterial: 59 mmHg — ABNORMAL LOW (ref 83.0–108.0)

## 2018-10-18 LAB — CBC
HCT: 34 % — ABNORMAL LOW (ref 36.0–46.0)
Hemoglobin: 10.1 g/dL — ABNORMAL LOW (ref 12.0–15.0)
MCH: 30.1 pg (ref 26.0–34.0)
MCHC: 29.7 g/dL — ABNORMAL LOW (ref 30.0–36.0)
MCV: 101.2 fL — ABNORMAL HIGH (ref 80.0–100.0)
Platelets: 187 10*3/uL (ref 150–400)
RBC: 3.36 MIL/uL — ABNORMAL LOW (ref 3.87–5.11)
RDW: 12.5 % (ref 11.5–15.5)
WBC: 9.7 10*3/uL (ref 4.0–10.5)
nRBC: 0 % (ref 0.0–0.2)

## 2018-10-18 LAB — DIFFERENTIAL
Abs Immature Granulocytes: 0.05 10*3/uL (ref 0.00–0.07)
Basophils Absolute: 0 10*3/uL (ref 0.0–0.1)
Basophils Relative: 0 %
Eosinophils Absolute: 0.2 10*3/uL (ref 0.0–0.5)
Eosinophils Relative: 2 %
Immature Granulocytes: 1 %
Lymphocytes Relative: 12 %
Lymphs Abs: 1.1 10*3/uL (ref 0.7–4.0)
Monocytes Absolute: 0.7 10*3/uL (ref 0.1–1.0)
Monocytes Relative: 8 %
Neutro Abs: 7.6 10*3/uL (ref 1.7–7.7)
Neutrophils Relative %: 77 %

## 2018-10-18 LAB — HEMOGLOBIN A1C
Hgb A1c MFr Bld: 5.1 % (ref 4.8–5.6)
Mean Plasma Glucose: 99.67 mg/dL

## 2018-10-18 LAB — RAPID URINE DRUG SCREEN, HOSP PERFORMED
Amphetamines: NOT DETECTED
Barbiturates: NOT DETECTED
Benzodiazepines: NOT DETECTED
Cocaine: NOT DETECTED
Opiates: NOT DETECTED
Tetrahydrocannabinol: NOT DETECTED

## 2018-10-18 LAB — LACTIC ACID, PLASMA
Lactic Acid, Venous: 6.5 mmol/L (ref 0.5–1.9)
Lactic Acid, Venous: 3.3 mmol/L (ref 0.5–1.9)
Lactic Acid, Venous: 2 mmol/L (ref 0.5–1.9)
Lactic Acid, Venous: 1.5 mmol/L (ref 0.5–1.9)

## 2018-10-18 LAB — PROTIME-INR
INR: 1.1 (ref 0.8–1.2)
Prothrombin Time: 13.7 seconds (ref 11.4–15.2)

## 2018-10-18 LAB — ETHANOL: Alcohol, Ethyl (B): <10 mg/dL (ref <10)

## 2018-10-18 LAB — CBG MONITORING, ED: Glucose-Capillary: 262 mg/dL — ABNORMAL HIGH (ref 70–99)

## 2018-10-18 LAB — SARS CORONAVIRUS 2 BY RT PCR (HOSPITAL ORDER, PERFORMED IN ~~LOC~~ HOSPITAL LAB): SARS Coronavirus 2: NEGATIVE

## 2018-10-18 MED ORDER — LORAZEPAM 2 MG/ML IJ SOLN
INTRAMUSCULAR | Status: AC
  Filled 2018-10-18: qty 1

## 2018-10-18 MED ORDER — ONDANSETRON HCL 4 MG PO TABS: 4.0000 mg | ORAL_TABLET | Freq: Four times a day (QID) | ORAL | Status: DC | PRN

## 2018-10-18 MED ORDER — LEVETIRACETAM IN NACL 1000 MG/100ML IV SOLN
1000.0000 mg | INTRAVENOUS | Status: AC
  Administered 2018-10-18: 1000 mg via INTRAVENOUS
  Filled 2018-10-18: qty 100

## 2018-10-18 MED ORDER — DOCUSATE SODIUM 100 MG PO CAPS
100.0000 mg | ORAL_CAPSULE | Freq: Two times a day (BID) | ORAL | Status: DC
  Administered 2018-10-18 – 2018-10-19 (×3): 100 mg via ORAL
  Filled 2018-10-18 (×3): qty 1

## 2018-10-18 MED ORDER — ONDANSETRON HCL 4 MG/2ML IJ SOLN: 4.0000 mg | Freq: Four times a day (QID) | INTRAMUSCULAR | Status: DC | PRN

## 2018-10-18 MED ORDER — NALOXONE HCL 2 MG/2ML IJ SOSY
PREFILLED_SYRINGE | INTRAMUSCULAR | Status: AC
  Filled 2018-10-18: qty 2

## 2018-10-18 MED ORDER — ALBUTEROL SULFATE (2.5 MG/3ML) 0.083% IN NEBU: 3.0000 mL | INHALATION_SOLUTION | Freq: Four times a day (QID) | RESPIRATORY_TRACT | Status: DC | PRN

## 2018-10-18 MED ORDER — PRAVASTATIN SODIUM 40 MG PO TABS: 40.0000 mg | ORAL_TABLET | Freq: Two times a day (BID) | ORAL | Status: DC

## 2018-10-18 MED ORDER — AMLODIPINE BESYLATE 2.5 MG PO TABS: 2.5000 mg | ORAL_TABLET | Freq: Every day | ORAL | Status: DC

## 2018-10-18 MED ORDER — INSULIN ASPART 100 UNIT/ML ~~LOC~~ SOLN: 0.0000 [IU] | Freq: Every day | SUBCUTANEOUS | Status: DC

## 2018-10-18 MED ORDER — PRAMIPEXOLE DIHYDROCHLORIDE 0.25 MG PO TABS
0.2500 mg | ORAL_TABLET | Freq: Every day | ORAL | Status: DC
  Administered 2018-10-18 – 2018-10-19 (×2): 0.25 mg via ORAL
  Filled 2018-10-18 (×2): qty 1

## 2018-10-18 MED ORDER — FEBUXOSTAT 40 MG PO TABS
80.0000 mg | ORAL_TABLET | Freq: Every day | ORAL | Status: DC
  Administered 2018-10-18 – 2018-10-19 (×2): 80 mg via ORAL
  Filled 2018-10-18 (×2): qty 2

## 2018-10-18 MED ORDER — LORAZEPAM 2 MG/ML IJ SOLN: 1.0000 mg | INTRAMUSCULAR | Status: DC | PRN

## 2018-10-18 MED ORDER — POLYETHYLENE GLYCOL 3350 17 G PO PACK: 17.0000 g | PACK | Freq: Every day | ORAL | Status: DC | PRN

## 2018-10-18 MED ORDER — HYDROCODONE-ACETAMINOPHEN 10-325 MG PO TABS: 1.0000 | ORAL_TABLET | Freq: Every day | ORAL | Status: DC | PRN

## 2018-10-18 MED ORDER — NALOXONE HCL 0.4 MG/ML IJ SOLN: 0.4000 mg | Freq: Once | INTRAMUSCULAR | Status: DC

## 2018-10-18 MED ORDER — SODIUM CHLORIDE 0.9 % IV SOLN
75.0000 mL/h | INTRAVENOUS | Status: DC
  Administered 2018-10-18 (×2): 75 mL/h via INTRAVENOUS

## 2018-10-18 MED ORDER — HALOPERIDOL LACTATE 5 MG/ML IJ SOLN
2.0000 mg | INTRAMUSCULAR | Status: AC
  Administered 2018-10-18: 2 mg via INTRAVENOUS
  Filled 2018-10-18: qty 1

## 2018-10-18 MED ORDER — NALOXONE HCL 0.4 MG/ML IJ SOLN
INTRAMUSCULAR | Status: AC
  Filled 2018-10-18: qty 1

## 2018-10-18 MED ORDER — INSULIN ASPART 100 UNIT/ML ~~LOC~~ SOLN: 0.0000 [IU] | Freq: Three times a day (TID) | SUBCUTANEOUS | Status: DC

## 2018-10-18 MED ORDER — LORATADINE 10 MG PO TABS
10.0000 mg | ORAL_TABLET | Freq: Every day | ORAL | Status: DC
  Administered 2018-10-18 – 2018-10-19 (×2): 10 mg via ORAL
  Filled 2018-10-18 (×2): qty 1

## 2018-10-18 MED ORDER — ENOXAPARIN SODIUM 80 MG/0.8ML ~~LOC~~ SOLN
75.0000 mg | SUBCUTANEOUS | Status: DC
  Administered 2018-10-18: 75 mg via SUBCUTANEOUS
  Filled 2018-10-18: qty 0.8

## 2018-10-18 MED ORDER — ASPIRIN EC 81 MG PO TBEC
81.0000 mg | DELAYED_RELEASE_TABLET | Freq: Every day | ORAL | Status: DC
  Administered 2018-10-18 – 2018-10-19 (×2): 81 mg via ORAL
  Filled 2018-10-18 (×2): qty 1

## 2018-10-18 MED ORDER — NALOXONE HCL 2 MG/2ML IJ SOSY
PREFILLED_SYRINGE | INTRAMUSCULAR | Status: AC | PRN
  Administered 2018-10-18: 2 mg via INTRAVENOUS
  Administered 2018-10-18: 0.4 mg via INTRAVENOUS

## 2018-10-18 MED ORDER — PANTOPRAZOLE SODIUM 40 MG PO TBEC
40.0000 mg | DELAYED_RELEASE_TABLET | Freq: Every day | ORAL | Status: DC
  Administered 2018-10-18 – 2018-10-19 (×2): 40 mg via ORAL
  Filled 2018-10-18 (×2): qty 1

## 2018-10-18 MED ORDER — ACETAMINOPHEN 325 MG PO TABS
650.0000 mg | ORAL_TABLET | ORAL | Status: DC | PRN
  Administered 2018-10-19: 07:00:00 650 mg via ORAL
  Filled 2018-10-18: qty 2

## 2018-10-18 MED ORDER — CARVEDILOL 12.5 MG PO TABS: 12.5000 mg | ORAL_TABLET | Freq: Four times a day (QID) | ORAL | Status: DC

## 2018-10-18 MED ORDER — ACETAMINOPHEN 650 MG RE SUPP: 650.0000 mg | RECTAL | Status: DC | PRN

## 2018-10-18 NOTE — ED Notes (Signed)
Patient is restless while awake. Unable to follow commands, requires Air cabin crew. MD aware and Haldol given with no relief.

## 2018-10-18 NOTE — ED Notes (Signed)
Patient having seizure; de-sat 38%. Md at bedside. Ativan ordered

## 2018-10-18 NOTE — H&P (Addendum)
History and Physical    Kimberly Howell MWU:[id number] DOB: 02/29/1946 DOA: 10/18/2018  PCP: Seward Carol, MD Consultants:  Harrington Challenger - cardiology; Ander Slade - pulmonology Patient coming from:  Home - lives with son; Donald Prose: Evorn Gong, [phone]  Chief Complaint: AMS  HPI: Kimberly Howell is a 73 y.o. female with medical history significant of OSA on CPAP; HTN; HLD; DM type II; CKD stage III; RLS; COPD on 3L home O2; and morbid obesity (BMI 50-59) presenting with AMS.  The patient is somewhat somnolent but able to answer questions.  She is oriented to person and with time is able to surmise that she is at "the hospital?".  Thinks it is 2000.  She has no idea what happened.  I called and discussed the patient with her son and various other family members.  Her son has been staying with her - she has been "having the jitters".  Her legs have been giving out and she has been falling.  She started on CPAP about a month ago, in addition to continuous 3L [place] O2.  She seemed to be getting energy in the mornings and was doing better.  She had cramps in her legs last night about 10.  Her son checked on her overnight and her usual autopap is about 5-7.  Last night it went up to 14.7.  He went in to check on her; she looked like she bit her lip and she wasn't responding.  He removed the CPAP and called 911; he was instructed to start chest compressions.  She is often drowsy and confused in the mornings.  She may have had some confusion, ?psychosis, recently.  Recent concern for Parkinson's according to family.   ED Course:  Possible seizure.  Not doing well with CPAP at home.  Presented very altered, GCS 3.  Became agitated, given Haldol.  Head CT ok.  Loaded with Keppra.  Lactate 6.5, likely seizures.  Neuro did not see her.  Review of Systems: As per HPI; otherwise review of systems reviewed and negative.    Ambulatory Status:  Minimally ambulatory  PMH, PSH, SH, and FH reviewed in patient's chart; this chart  will be merged at a later time.  History reviewed. No pertinent past medical history.  History reviewed. No pertinent surgical history.  Social History   Socioeconomic History  . Marital status: Married    Spouse name: Not on file  . Number of children: Not on file  . Years of education: Not on file  . Highest education level: Not on file  Occupational History  . Not on file  Social Needs  . Financial resource strain: Not on file  . Food insecurity:    Worry: Not on file    Inability: Not on file  . Transportation needs:    Medical: Not on file    Non-medical: Not on file  Tobacco Use  . Smoking status: Not on file  Substance and Sexual Activity  . Alcohol use: Not Currently  . Drug use: Not Currently  . Sexual activity: Not on file  Lifestyle  . Physical activity:    Days per week: Not on file    Minutes per session: Not on file  . Stress: Not on file  Relationships  . Social connections:    Talks on phone: Not on file    Gets together: Not on file    Attends religious service: Not on file    Active member of club or organization: Not on  file    Attends meetings of clubs or organizations: Not on file    Relationship status: Not on file  . Intimate partner violence:    Fear of current or ex partner: Not on file    Emotionally abused: Not on file    Physically abused: Not on file    Forced sexual activity: Not on file  Other Topics Concern  . Not on file  Social History Narrative  . Not on file    No Known Allergies  No family history on file.  Prior to Admission medications   Not on File    Physical Exam: Vitals:   10/18/18 0555 10/18/18 0700 10/18/18 0805 10/18/18 0919  BP: 97/72 (!) 93/43 95/75 (!) 116/56  Pulse:      Resp:  19    Temp: (!) 95.8 F (35.4 C) (!) 96.6 F (35.9 C) (!) 96.8 F (36 C) 97.6 F (36.4 C)  TempSrc:    Oral  SpO2:    97%  Weight:         . General:  Appears calm and is NAD; she is somnolent and somewhat slow to  arouse . Eyes:   EOMI, normal lids, iris . ENT:  Very hard of hearing, normal lips & tongue . Neck:  no LAD, masses or thyromegaly . Cardiovascular:  RRR, no m/r/g. No LE edema.  Marland Kitchen Respiratory:   CTA bilaterally with no wheezes/rales/rhonchi.  Normal respiratory effort. . Abdomen:  soft, NT, ND, NABS . Skin:  no rash or induration seen on limited exam . Musculoskeletal:  grossly normal tone BUE/BLE, good ROM, no bony abnormality . Psychiatric:  blunted mood and affect with mild cognitive slowing, mildly confused, AOx1-2 . Neurologic:  CN 2-12 grossly intact    Radiological Exams on Admission: Ct Head Wo Contrast  Result Date: 10/18/2018 CLINICAL DATA:  Found unresponsive this morning EXAM: CT HEAD WITHOUT CONTRAST TECHNIQUE: Contiguous axial images were obtained from the base of the skull through the vertex without intravenous contrast. COMPARISON:  None. FINDINGS: Brain: No evidence of acute infarction, hemorrhage, hydrocephalus, extra-axial collection or mass lesion/mass effect. Probable mild chronic small vessel ischemic change in the cerebral white matter Vascular: No hyperdense vessel or unexpected calcification. Skull: Normal. Negative for fracture or focal lesion. Sinuses/Orbits: There is an opacified left ethmoid air cell with expansion and broad bony defect at the fovea ethmoidalis. Other: Mild image blurring likely from motion IMPRESSION: 1. No acute finding. 2. 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 Recommend ENT referral after convalescence. Electronically Signed   By: Monte Fantasia M.D.   On: 10/18/2018 05:44   Dg Chest Portable 1 View  Result Date: 10/18/2018 CLINICAL DATA:  Unresponsive.  Seizure. EXAM: PORTABLE CHEST 1 VIEW COMPARISON:  None. FINDINGS: Cardiomegaly and vascular pedicle widening. Borderline vascular congestion accentuated by low volumes. No Kerley lines, air bronchogram, effusion, or pneumothorax. Probable atelectasis at the  bases. Prominent bilateral glenohumeral osteoarthritis. IMPRESSION: 1. Low volume chest with probable atelectasis at the bases. 2. Cardiomegaly. Electronically Signed   By: Monte Fantasia M.D.   On: 10/18/2018 05:27    EKG: Independently reviewed.  NSR with rate 67; nonspecific ST changes with no evidence of acute ischemia   Labs on Admission: I have personally reviewed the available labs and imaging studies at the time of the admission.  Pertinent labs:   ABG: 7.284/63.3/59.0/30.4/88% UA: 50 glucose, 30 protein, rare bacteria UDS negative Glucose 268 BUN 26/Creatinine 2.62/GFR 21 - stable  from 9/19 Lactate 6.5 WBC 9.7 Hgb 10.1; 10.8 in 9/19 INR 1.1 COVID negative  Assessment/Plan Principal Problem:   Acute encephalopathy Active Problems:   Morbid obesity with BMI of 50.0-59.9, adult (HCC)   Chronic respiratory failure (HCC)   CKD (chronic kidney disease) stage 3, GFR 30-59 ml/min (HCC)   Essential hypertension   OSA on CPAP   Acute encephalopathy -Patient with chronic cognitive slowing in the mornings and excessive daytime somnolence at baseline presenting after being found unresponsive -Per notes, she was profoundly hypoxic in the 30s when EMS arrived -Marked lactic acidosis to 6.5 on arrival, improving -Thought to have had seizure at home, witnessed seizure in the ER; this may have been triggered by hypoxia -Family reports ?h/o Parkinson's, although RLS is documented in the chart and she is on Mirapex; will continue, as per neurology -She has been treated with Ultram, which is known to lower the seizure threshold; however, it does not appear that she has been taking this recently, as per pharmacy -Patient placed in observation overnight for further evaluation -Neurology has seen the patient -Loaded with Keppra 1000 mg IV -Will order EEG  -She also will need driving restriction for at least 6 months - although she does not appear to be mobile or driving at this time  -Seizure precautions -Ativan prn  Chronic respiratory failure associated with COPD, OSA, and likely OHS -Patient is on 3L chronic [place] O2 and was 71% on RA with ambulation in 10/19 when she qualified for home O2 -Sleep study showing mild OSA and qualifying for CPAP was 2/22; family reports she has been on CPAP for maybe a month -She did have a televisit with cardiology on 4/17 but that note is not complete -Suspect that patient has multiple reasons for chronic hypoxia including above as well as extremely sedentary lifestyle and likely OHS -The thought is that hypoxia may have precipitated today's seizure -It is not clear if she will have ongoing cognitive slowing from anoxic/hypoxic injury or if she will return to baseline at this time  Morbid obesity -As noted above, BMI 50+ and very sedentary  Stage 3 CKD -Appears to be stable at this time -Hold Lisinopril, consider d/c in this setting  HTN -She has not been taking Coreg or Norvasc and has only been taking Lisinopril, according to pharmacy -Will hold Lisinopril -She was mildly hypotensive upon arrival so will not order an additional agent at this time and will follow  Lactic acidosis -Thought to be related to seizure activity -No current concern for sepsis -Clearing appropriately by most recent test but will continue trending to ensure resolution   Note: This patient has been tested and is negative for the novel coronavirus COVID-19.  DVT prophylaxis: Lovenox  Code Status:  Full  Family Communication: None present; I spoke to her son and various other family members by telephone  Disposition Plan:  Home once clinically improved Consults called: Neurology Admission status: It is my clinical opinion that referral for OBSERVATION is reasonable and necessary in this patient based on the above information provided. The aforementioned taken together are felt to place the patient at high risk for further clinical deterioration. However  it is anticipated that the patient may be medically stable for discharge from the hospital within 24 to 48 hours.    Karmen Bongo MD [facility]   How to contact the [facility] Attending or Consulting provider [place] or covering provider during after hours [place], for this patient?  1.  Check the care team in [facility] and look for a) attending/consulting TR[facility] provider listed and b) the [facility] team listed 2. Log into [email or web address] and use [place]'s universal password to access. If you do not have the password, please contact the hospital operator. 3. Locate the [facility] provider you are looking for under [facility] and page to a number that you can be directly reached. 4. If you still have difficulty reaching the provider, please page the [facility] (Director on Call) for the Hospitalists listed on amion for assistance.   10/18/2018, 9:42 AM

## 2018-10-18 NOTE — Progress Notes (Signed)
Hypoglycemic Event  CBG: 54  Treatment: 8 oz juice/soda  Symptoms: None  Follow-up CBG: Time: 1705 CBG Result:69  Possible Reasons for Event: Inadequate meal intake      Luci Bank

## 2018-10-18 NOTE — ED Notes (Signed)
Spoke with Anushri Casalino (son), [phone] states that his mother was recently started on medication for Parkinson's x 1 month. Sleeps with a cpap machine and he heard the machine making a funny noise this am. So he went to check on her and found her unresponsive approx. 400am. Called 911, states he thought she had bit her tongue noticed blood around her mouth, states no history of seizures.

## 2018-10-18 NOTE — Progress Notes (Signed)
Pt. Came from ED with foley catheter and no orders or indications.  This nurse call son Gaspar Bidding at [phone] to see if pt. Had the foley catheter at home.  Son stated, she didn't and please do not place a foley in her, she can tell you when she has to go to the BR and gets up frequent at nights.  Informed son that she had a foley catheter when she got to the floor and that we will remove it.  Will continue to monitor and have RN remove foley.  Alphonzo Lemmings, RN

## 2018-10-18 NOTE — ED Notes (Signed)
ED TO INPATIENT HANDOFF REPORT  ED Nurse Name and Phone #: Abigail Butts [phone]  S Name/Age/Gender Kimberly Howell 73 y.o. female Room/Bed: TRAAC/TRAAC  Code Status   Code Status: Full Code  Home/SNF/Other Home Patient oriented to: self, place, time and situation Is this baseline? Yes   Triage Complete: Triage complete  Chief Complaint Unresponsive  Triage Note Patient was found this morning, unresponsive. Per EMS, breathing 8-10 times per minutes, SPO2 @ 35% - placed on NRB, NPA placed for open airway. Initial HR was 30 bpm and was placed on external pacemaker (60bpm @ 181ma). Patient's heartbeat increased on its own Family poor historians.    Allergies No Known Allergies  Level of Care/Admitting Diagnosis ED Disposition    ED Disposition Condition [facility] Area: [place] [[id number]]  Level of Care: Telemetry Medical [104]  I expect the patient will be discharged within 24 hours: Yes  LOW acuity---Tx typically complete <24 hrs---ACUTE conditions typically can be evaluated <24 hours---LABS likely to return to acceptable levels <24 hours---IS near functional baseline---EXPECTED to return to current living arrangement---NOT newly hypoxic: Meets criteria for 5C-Observation unit  Covid Evaluation: Screening Protocol (No Symptoms)  Diagnosis: Acute encephalopathy [224825]  Admitting Physician: Karmen Bongo [[id number]]  Attending Physician: Karmen Bongo [[id number]]  PT Class (Do Not Modify): Observation [104]  PT Acc Code (Do Not Modify): Observation [10022]       B Medical/Surgery History History reviewed. No pertinent past medical history. History reviewed. No pertinent surgical history.   A IV Location/Drains/Wounds Patient Lines/Drains/Airways Status   Active Line/Drains/Airways    Name:   Placement date:   Placement time:   Site:   Days:   Peripheral IV 10/18/18 Right Hand   10/18/18    0459    Hand   less than 1           Intake/Output Last 24 hours No intake or output data in the 24 hours ending 10/18/18 0830  Labs/Imaging Results for orders placed or performed during the hospital encounter of 10/18/18 (from the past 48 hour(s))  Lactic acid, plasma     Status: Abnormal   Collection Time: 10/18/18  4:51 AM  Result Value Ref Range   Lactic Acid, Venous 6.5 (HH) 0.5 - 1.9 mmol/L    Comment: CRITICAL RESULT CALLED TO, READ BACK BY AND VERIFIED WITH: MOON K,RN 10/18/18 [place] Performed at [facility], [address] [place], [place] [address]   Protime-INR     Status: None   Collection Time: 10/18/18  4:57 AM  Result Value Ref Range   Prothrombin Time 13.7 11.4 - 15.2 seconds   INR 1.1 0.8 - 1.2    Comment: (NOTE) INR goal varies based on device and disease states. Performed at [facility], [place] [address] [place], [place] [address]   APTT     Status: None   Collection Time: 10/18/18  4:57 AM  Result Value Ref Range   aPTT 25 24 - 36 seconds    Comment: Performed at [facility] [address], [place] [address]  CBC     Status: Abnormal   Collection Time: 10/18/18  4:57 AM  Result Value Ref Range   WBC 9.7 4.0 - 10.5 K/uL   RBC 3.36 (L) 3.87 - 5.11 MIL/uL   Hemoglobin 10.1 (L) 12.0 - 15.0 g/dL   HCT 34.0 (L) 36.0 - 46.0 %   MCV 101.2 (H) 80.0 -  100.0 fL   MCH 30.1 26.0 - 34.0 pg   MCHC 29.7 (L) 30.0 - 36.0 g/dL   RDW 12.5 11.5 - 15.5 %   Platelets 187 150 - 400 K/uL   nRBC 0.0 0.0 - 0.2 %    Comment: Performed at [facility], [place] [address], [place], [place] [address]  Differential     Status: None   Collection Time: 10/18/18  4:57 AM  Result Value Ref Range   Neutrophils Relative % 77 %   Neutro Abs 7.6 1.7 - 7.7 K/uL   Lymphocytes Relative 12 %   Lymphs Abs 1.1 0.7 - 4.0 K/uL   Monocytes Relative 8 %   Monocytes Absolute 0.7 0.1 - 1.0 K/uL   Eosinophils Relative 2 %   Eosinophils Absolute 0.2 0.0 - 0.5 K/uL   Basophils Relative 0 %    Basophils Absolute 0.0 0.0 - 0.1 K/uL   Immature Granulocytes 1 %   Abs Immature Granulocytes 0.05 0.00 - 0.07 K/uL    Comment: Performed at [facility] [address], [place], [place] [address]  Comprehensive metabolic panel     Status: Abnormal   Collection Time: 10/18/18  4:57 AM  Result Value Ref Range   Sodium 138 135 - 145 mmol/L   Potassium 5.0 3.5 - 5.1 mmol/L   Chloride 98 98 - 111 mmol/L   CO2 25 22 - 32 mmol/L   Glucose, Bld 268 (H) 70 - 99 mg/dL   BUN 26 (H) 8 - 23 mg/dL   Creatinine, Ser 2.62 (H) 0.44 - 1.00 mg/dL   Calcium 8.3 (L) 8.9 - 10.3 mg/dL   Total Protein 6.3 (L) 6.5 - 8.1 g/dL   Albumin 3.1 (L) 3.5 - 5.0 g/dL   AST 35 15 - 41 U/L   ALT 24 0 - 44 U/L   Alkaline Phosphatase 87 38 - 126 U/L   Total Bilirubin 0.6 0.3 - 1.2 mg/dL   GFR calc non Af Amer 18 (L) >60 mL/min   GFR calc Af Amer 21 (L) >60 mL/min   Anion gap 15 5 - 15    Comment: Performed at [facility], [place] [address], [place], [place] [address]  Ethanol     Status: None   Collection Time: 10/18/18  4:57 AM  Result Value Ref Range   Alcohol, Ethyl (B) <10 <10 mg/dL    Comment: (NOTE) Lowest detectable limit for serum alcohol is 10 mg/dL. For medical purposes only. Performed at [facility], [place] [address], [place],  [address]   CBG monitoring, ED     Status: Abnormal   Collection Time: 10/18/18  5:01 AM  Result Value Ref Range   Glucose-Capillary 262 (H) 70 - 99 mg/dL  SARS Coronavirus 2 [facility] order, Performed in [place] hospital lab)     Status: None   Collection Time: 10/18/18  5:07 AM  Result Value Ref Range   SARS Coronavirus 2 NEGATIVE NEGATIVE    Comment: (NOTE) If result is NEGATIVE SARS-CoV-2 target nucleic acids are NOT DETECTED. The SARS-CoV-2 RNA is generally detectable in upper and lower  respiratory specimens during the acute phase of infection. The lowest  concentration of SARS-CoV-2 viral copies this assay can detect is 250  copies / mL.  A negative result does not preclude SARS-CoV-2 infection  and should not be used as the sole basis for treatment or other  patient management decisions.  A negative result may occur  with  improper specimen collection / handling, submission of specimen other  than nasopharyngeal swab, presence of viral mutation(s) within the  areas targeted by this assay, and inadequate number of viral copies  (<250 copies / mL). A negative result must be combined with clinical  observations, patient history, and epidemiological information. If result is POSITIVE SARS-CoV-2 target nucleic acids are DETECTED. The SARS-CoV-2 RNA is generally detectable in upper and lower  respiratory specimens dur ing the acute phase of infection.  Positive  results are indicative of active infection with SARS-CoV-2.  Clinical  correlation with patient history and other diagnostic information is  necessary to determine patient infection status.  Positive results do  not rule out bacterial infection or co-infection with other viruses. If result is PRESUMPTIVE POSTIVE SARS-CoV-2 nucleic acids MAY BE PRESENT.   A presumptive positive result was obtained on the submitted specimen  and confirmed on repeat testing.  While 2019 novel coronavirus  (SARS-CoV-2) nucleic acids may be present in the submitted sample  additional confirmatory testing may be necessary for epidemiological  and / or clinical management purposes  to differentiate between  SARS-CoV-2 and other Sarbecovirus currently known to infect humans.  If clinically indicated additional testing with an alternate test  methodology [phone]) is advised. The SARS-CoV-2 RNA is generally  detectable in upper and lower respiratory sp ecimens during the acute  phase of infection. The expected result is Negative. Fact Sheet for Patients:  StrictlyIdeas.no Fact Sheet for Healthcare Providers: BankingDealers.co.za This test is not  yet approved or cleared by the [place] FDA and has been authorized for detection and/or diagnosis of SARS-CoV-2 by FDA under an Emergency Use Authorization (EUA).  This EUA will remain in effect (meaning this test can be used) for the duration of the COVID-19 declaration under Section 564(b)(1) of the Act, 21 U.S.C. section 360bbb-3(b)(1), unless the authorization is terminated or revoked sooner. Performed at [facility], [place] [address] [place], [place] [address]   Urinalysis, Routine w reflex microscopic     Status: Abnormal   Collection Time: 10/18/18  5:50 AM  Result Value Ref Range   Color, Urine YELLOW YELLOW   APPearance CLEAR CLEAR   Specific Gravity, Urine 1.014 1.005 - 1.030   pH 5.0 5.0 - 8.0   Glucose, UA 50 (A) NEGATIVE mg/dL   Hgb urine dipstick NEGATIVE NEGATIVE   Bilirubin Urine NEGATIVE NEGATIVE   Ketones, ur NEGATIVE NEGATIVE mg/dL   Protein, ur 30 (A) NEGATIVE mg/dL   Nitrite NEGATIVE NEGATIVE   Leukocytes,Ua NEGATIVE NEGATIVE   RBC / HPF 0-5 0 - 5 RBC/hpf   WBC, UA 0-5 0 - 5 WBC/hpf   Bacteria, UA RARE (A) NONE SEEN   Squamous Epithelial / LPF 0-5 0 - 5   Mucus PRESENT    Hyaline Casts, UA PRESENT     Comment: Performed at [facility], [address] [place], [place] [address]  Rapid urine drug screen (hospital performed)     Status: None   Collection Time: 10/18/18  5:50 AM  Result Value Ref Range   Opiates NONE DETECTED NONE DETECTED   Cocaine NONE DETECTED NONE DETECTED   Benzodiazepines NONE DETECTED NONE DETECTED   Amphetamines NONE DETECTED NONE DETECTED   Tetrahydrocannabinol NONE DETECTED NONE DETECTED   Barbiturates NONE DETECTED NONE DETECTED    Comment: (NOTE) DRUG SCREEN FOR MEDICAL PURPOSES ONLY.  IF CONFIRMATION IS NEEDED FOR ANY PURPOSE, NOTIFY LAB WITHIN 5 DAYS. LOWEST DETECTABLE LIMITS FOR  URINE DRUG SCREEN Drug Class                     Cutoff (ng/mL) Amphetamine and metabolites    1000 Barbiturate and  metabolites    200 Benzodiazepine                 664 Tricyclics and metabolites     300 Opiates and metabolites        300 Cocaine and metabolites        300 THC                            50 Performed at [facility], [place] [address] [place], [place] [address]   I-STAT 7, (LYTES, BLD GAS, ICA, H+H)     Status: Abnormal   Collection Time: 10/18/18  6:08 AM  Result Value Ref Range   pH, Arterial 7.284 (L) 7.350 - 7.450   pCO2 arterial 63.3 (H) 32.0 - 48.0 mmHg   pO2, Arterial 59.0 (L) 83.0 - 108.0 mmHg   Bicarbonate 30.5 (H) 20.0 - 28.0 mmol/L   TCO2 32 22 - 32 mmol/L   O2 Saturation 88.0 %   Acid-Base Excess 2.0 0.0 - 2.0 mmol/L   Sodium 137 135 - 145 mmol/L   Potassium 4.6 3.5 - 5.1 mmol/L   Calcium, Ion 1.17 1.15 - 1.40 mmol/L   HCT 32.0 (L) 36.0 - 46.0 %   Hemoglobin 10.9 (L) 12.0 - 15.0 g/dL   Patient temperature 96.2 F    Collection site RADIAL, ALLEN'S TEST ACCEPTABLE    Drawn by RT    Sample type ARTERIAL    Comment NOTIFIED PHYSICIAN   Lactic acid, plasma     Status: Abnormal   Collection Time: 10/18/18  7:27 AM  Result Value Ref Range   Lactic Acid, Venous 3.3 (HH) 0.5 - 1.9 mmol/L    Comment: CRITICAL RESULT CALLED TO, READ BACK BY AND VERIFIED WITH: [place] [phone] [id number] BY A BENNETT Performed at [facility], [address], [place], [place] [address]    Ct Head Wo Contrast  Result Date: 10/18/2018 CLINICAL DATA:  Found unresponsive this morning EXAM: CT HEAD WITHOUT CONTRAST TECHNIQUE: Contiguous axial images were obtained from the base of the skull through the vertex without intravenous contrast. COMPARISON:  None. FINDINGS: Brain: No evidence of acute infarction, hemorrhage, hydrocephalus, extra-axial collection or mass lesion/mass effect. Probable mild chronic small vessel ischemic change in the cerebral white matter Vascular: No hyperdense vessel or unexpected calcification. Skull: Normal. Negative for fracture or focal lesion.  Sinuses/Orbits: There is an opacified left ethmoid air cell with expansion and broad bony defect at the fovea ethmoidalis. Other: Mild image blurring likely from motion IMPRESSION: 1. No acute finding. 2. 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 Recommend ENT referral after convalescence. Electronically Signed   By: Monte Fantasia M.D.   On: 10/18/2018 05:44   Dg Chest Portable 1 View  Result Date: 10/18/2018 CLINICAL DATA:  Unresponsive.  Seizure. EXAM: PORTABLE CHEST 1 VIEW COMPARISON:  None. FINDINGS: Cardiomegaly and vascular pedicle widening. Borderline vascular congestion accentuated by low volumes. No Kerley lines, air bronchogram, effusion, or pneumothorax. Probable atelectasis at the bases. Prominent bilateral glenohumeral osteoarthritis. IMPRESSION: 1. Low volume chest with probable atelectasis at the bases. 2. Cardiomegaly. Electronically Signed   By: Monte Fantasia M.D.   On: 10/18/2018 05:27  Pending Labs Unresulted Labs (From admission, onward)    Start     Ordered   10/19/18 0500  Comprehensive metabolic panel  Tomorrow morning,   R     10/18/18 0813   10/19/18 0500  CBC  Tomorrow morning,   R     10/18/18 0813   10/18/18 0809  Hemoglobin A1c  Once,   R    Comments:  To assess prior glycemic control    10/18/18 0813   10/18/18 0809  HIV antibody (Routine Testing)  Once,   R     10/18/18 0813          Vitals/Pain Today's Vitals   10/18/18 0555 10/18/18 0700 10/18/18 0805 10/18/18 0805  BP: 97/72 (!) 93/43 95/75   Pulse:      Resp:  19    Temp: (!) 95.8 F (35.4 C) (!) 96.6 F (35.9 C) (!) 96.8 F (36 C)   TempSrc:      SpO2:      Weight:      PainSc:    4     Isolation Precautions No active isolations  Medications Medications  enoxaparin (LOVENOX) injection 40 mg (has no administration in time range)  0.9 %  sodium chloride infusion (has no administration in time range)  LORazepam (ATIVAN) injection 1-2 mg (has no  administration in time range)  acetaminophen (TYLENOL) tablet 650 mg (has no administration in time range)    Or  acetaminophen (TYLENOL) suppository 650 mg (has no administration in time range)  docusate sodium (COLACE) capsule 100 mg (has no administration in time range)  polyethylene glycol (MIRALAX / GLYCOLAX) packet 17 g (has no administration in time range)  ondansetron (ZOFRAN) tablet 4 mg (has no administration in time range)    Or  ondansetron (ZOFRAN) injection 4 mg (has no administration in time range)  insulin aspart (novoLOG) injection 0-20 Units (has no administration in time range)  insulin aspart (novoLOG) injection 0-5 Units (has no administration in time range)  naloxone [facility]) injection ( Intravenous Canceled Entry 10/18/18 0515)  levETIRAcetam (KEPPRA) IVPB 1000 mg/100 mL premix (0 mg Intravenous Stopped 10/18/18 0805)  haloperidol lactate (HALDOL) injection 2 mg (2 mg Intravenous Given 10/18/18 0610)    Mobility walks with device     Focused Assessments Neuro Assessment Handoff:  Swallow screen pass? Not completed Cardiac Rhythm: Normal sinus rhythm       Neuro Assessment: Within Defined Limits Neuro Checks:      Last Documented NIHSS Modified Score:   Has TPA been given? No If patient is a Neuro Trauma and patient is going to OR before floor call report to [place] nurse: [phone] or [phone]     R Recommendations: See Admitting Provider Note  Report given to:   Additional Notes: son updated on plan of care per previous RN

## 2018-10-18 NOTE — Progress Notes (Signed)
Hypoglycemic Event  CBG: 67  Treatment: 8 oz juice/soda  Symptoms: None  Follow-up CBG: Time:1314 CBG Result:83  Possible Reasons for Event: Inadequate meal intake     Kimberly Howell

## 2018-10-18 NOTE — Procedures (Signed)
ELECTROENCEPHALOGRAM REPORT   Patient: Kimberly Howell       Room #: 1Q94H EEG No. ID: [id number] Age: 73 y.o.        Sex: female Referring Physician: Lorin Mercy Report Date:  10/18/2018        Interpreting Physician: Alexis Goodell  History: [name] is an 73 y.o. female found unresponsive  Medications:  ASA, Uloric, Insulin, Claritin, Protonix, Mirapex  Conditions of Recording:  This is a 21 channel routine scalp EEG performed with bipolar and monopolar montages arranged in accordance to the international 10/20 system of electrode placement. One channel was dedicated to EKG recording.  The patient is in the lethargic state.  Description:  The patient appears to achieve wakefulness for only a short period of the record.  During this period muscle and movement artifact predominant and no reading can be performed.  During the remainder of the record the background is low voltage, slow and poorly organized.  It consists of a mixture of disorganized frequencies with theta activity being most predominant.  THe posterior background rhythm is slow as well with highest frequency rhythm noted being in the theta range but this is poorly sustained and slower rhythms are seen often.  This slow activity is continuous and diffusely distributed.   No epileptiform activity is noted. Hyperventilation and intermittent photic stimulation were not performed.  IMPRESSION: This EEG is characterized by slowing which is consistent with normal drowse.  Can not rule out the possibility of slowing related to general cerebral disturbance such as a metabolic encephalopathy.  Clinical correlation recommended.  No epileptiform activity is noted.       Alexis Goodell, MD Neurology [phone] 10/18/2018, 11:10 AM

## 2018-10-18 NOTE — Progress Notes (Signed)
Hypoglycemic Event  CBG:69  Treatment: 8 oz juice/soda  Symptoms: None   Follow-up CBG: Time: 1726 CBG Result:100  Possible Reasons for Event: Inadequate meal intake     Luci Bank

## 2018-10-18 NOTE — ED Triage Notes (Signed)
Patient was found this morning, unresponsive. Per EMS, breathing 8-10 times per minutes, SPO2 @ 35% - placed on NRB, NPA placed for open airway. Initial HR was 30 bpm and was placed on external pacemaker (60bpm @ 157ma). Patient's heartbeat increased on its own Family poor historians.

## 2018-10-18 NOTE — Progress Notes (Signed)
Kimberly Howell is a 73 y.o. female patient admitted from ED awake, alert - oriented  X 4 - no acute distress noted.  VSS - Blood pressure (!) 116/56, pulse 82, temperature 97.6 F (36.4 C), temperature source Oral, resp. rate 19, weight (!) 150 kg, SpO2 97 %.    IV in place, occlusive dsg intact without redness.  Orientation to room, and floor completed with information packet given to patient/family.  Patient declined safety video at this time.  Admission INP armband ID verified with patient/family, and in place.   SR up x 2, fall assessment complete, with patient and family able to verbalize understanding of risk associated with falls, and verbalized understanding to call nsg before up out of bed.  Call light within reach, patient able to voice, and demonstrate understanding.  Skin, clean-dry- intact without evidence of bruising, or skin tears.   No evidence of skin break down noted on exam.     Will cont to eval and treat per MD orders.  Luci Bank, RN 10/18/2018 9:26 AM

## 2018-10-18 NOTE — Consult Note (Signed)
NEURO HOSPITALIST CONSULT NOTE   Requestig physician: Dr. Lorin Mercy  Reason for Consult: Altered mental status  History obtained from:   Chart    HPI:                                                                                                                                          Kimberly Howell is an 73 y.o. morbidly obese female who was found unresponsive this morning. 911 was called and the patient's son was instructed to start chest compressions. She is unable to provide a history and information is obtained from the chart. Per EMS, she was breathing 8-10 times per minute.  Her SpO2 was 35% and she was placed on NRB. Bradycardic initially at 30 bpm; she was placed on external pacemaker.   Her family were unable to provide much history to EMS, but were able to relate that family went to her room when her CPAP machine was making a funny noise this AM. When she was found there was blood near or in the oral region and it appeared as though she may have bitten her lip and/or tongue. Of note, she is often drowsy and confused in the mornings. Concern for possible Parkinson's per family for which she was started on medication (Mirapex) 1 month ago.   Per chart, she has been "having the jitters" recently and also with episodes of legs giving out with falls.   On arrival to the ED, she was somnolent but able to answer questions, oriented to person and time but otherwise confused. At 5:16 AM, RN note documents that the patient was having a seizure with desaturation to 38%. She became agitated and Haldol was administered. Head CT was unremarkable.  Has been loaded with Keppra in the ED. Neurology has been consulted to evaluate for possible seizure as the etiology for her AMS.   Her PMHx includes OSA on CPAP, COPD on 3 L home O2, HTN, HLD, DM2, CKD3, RLS. She has no history of seizures.    History reviewed. No pertinent past medical history.  History reviewed. No pertinent  surgical history.  No family history on file.            Social History:  reports previous alcohol use. She reports previous drug use. No history on file for tobacco.  No Known Allergies  MEDICATIONS:  Prior to Admission:  Amlodipine ASA Coreg Zyrtec Colchicine Voltaren Nexium Uloric Iron Lasix Glimepride Norco Lisinopril Meclizine MVI Mirapex Pravachol Sitagliptin Tramadol Ventolin  Scheduled: . docusate sodium  100 mg Oral BID  . enoxaparin (LOVENOX) injection  40 mg Subcutaneous Q24H  . insulin aspart  0-20 Units Subcutaneous TID WC  . insulin aspart  0-5 Units Subcutaneous QHS   Continuous: . sodium chloride       ROS:                                                                                                                                       Unable to obtain due to AMS.    Blood pressure 95/75, pulse 82, temperature (!) 96.8 F (36 C), resp. rate 19, weight (!) 150 kg, SpO2 100 %.   General Examination:                                                                                                       Physical Exam  General: Morbidly obese HEENT-  [place]/AT  Lungs- Not in respiratory distress  Extremities- Warm and well perfused  Neurological Examination Mental Status:  Somnolent. Opens eyes slightly in response to her name. Speech slow and dysarthric. Requires constant repetition of commands to perform and will perform only about 20%. Rapidly falls asleep after being stimulated to a semi-awake state. Cranial Nerves: II: Will briefly track examiner. PERRL.  III,IV, VI: Mild exotropia. Able to track to left and right briefly.  VII: Grimace is symmetric. Closes eyes when examiner attempting to passively elevate eyelids.  VIII: hearing intact to voice IX,X: Unable to assess XI: Head is midline Motor/Sensory: Will resist examiner  with 4/5 strength BUE.  Will wiggle ankles/toes to command and weakly withdraw with 2/5 strength to stimulation.  Deep Tendon Reflexes: Difficult to elicit due to prominent adiposity  Plantars: Right: downgoing   Left: downgoing Cerebellar/Gait: Unable to assess   Lab Results: Basic Metabolic Panel: Recent Labs  Lab 10/18/18 0457 10/18/18 0608  NA 138 137  K 5.0 4.6  CL 98  --   CO2 25  --   GLUCOSE 268*  --   BUN 26*  --   CREATININE 2.62*  --   CALCIUM 8.3*  --     CBC: Recent Labs  Lab 10/18/18 0457 10/18/18 0608  WBC 9.7  --   NEUTROABS 7.6  --   HGB 10.1* 10.9*  HCT 34.0* 32.0*  MCV 101.2*  --  PLT 187  --     Cardiac Enzymes: No results for input(s): CKTOTAL, CKMB, CKMBINDEX, TROPONINI in the last 168 hours.  Lipid Panel: No results for input(s): CHOL, TRIG, HDL, CHOLHDL, VLDL, LDLCALC in the last 168 hours.  Imaging: Ct Head Wo Contrast  Result Date: 10/18/2018 CLINICAL DATA:  Found unresponsive this morning EXAM: CT HEAD WITHOUT CONTRAST TECHNIQUE: Contiguous axial images were obtained from the base of the skull through the vertex without intravenous contrast. COMPARISON:  None. FINDINGS: Brain: No evidence of acute infarction, hemorrhage, hydrocephalus, extra-axial collection or mass lesion/mass effect. Probable mild chronic small vessel ischemic change in the cerebral white matter Vascular: No hyperdense vessel or unexpected calcification. Skull: Normal. Negative for fracture or focal lesion. Sinuses/Orbits: There is an opacified left ethmoid air cell with expansion and broad bony defect at the fovea ethmoidalis. Other: Mild image blurring likely from motion IMPRESSION: 1. No acute finding. 2. 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 Recommend ENT referral after convalescence. Electronically Signed   By: Monte Fantasia M.D.   On: 10/18/2018 05:44   Dg Chest Portable 1 View  Result Date: 10/18/2018 CLINICAL DATA:  Unresponsive.   Seizure. EXAM: PORTABLE CHEST 1 VIEW COMPARISON:  None. FINDINGS: Cardiomegaly and vascular pedicle widening. Borderline vascular congestion accentuated by low volumes. No Kerley lines, air bronchogram, effusion, or pneumothorax. Probable atelectasis at the bases. Prominent bilateral glenohumeral osteoarthritis. IMPRESSION: 1. Low volume chest with probable atelectasis at the bases. 2. Cardiomegaly. Electronically Signed   By: Monte Fantasia M.D.   On: 10/18/2018 05:27    Assessment: 73 year old female with COPD and OSA, presenting with AMS and lactate of 6.5 1. Possible seizure at home given possible tongue/lip bite with blood in/near oral region, in the context of AMS. Had a witnessed seizure in the ED.  2. Hypoxia also a likely significant contributing factor to her AMS. May have desaturated significantly and for a sustained period prior to being found by family.  3. Most likely etiology for seizure is felt to be acute hypoxia/hypercarbia  4. Possible Parkinson's per history. Recently started on pramipexole.   Recommendations: 1. Keppra 1000 mg IV has been loaded 2. Hold off on further Keppra as her mentation is improving and most likely correlates with improved respiratory variables.  3. EEG. 4. Outpatient neurology follow up for possible Parkinson's. Can continue pramipexole during this admission.     45 minutes spent in the emergent neurological evaluation and management of this critically ill patient.   Electronically signed: Dr. Kerney Elbe 10/18/2018, 8:51 AM

## 2018-10-18 NOTE — Progress Notes (Signed)
EEG completed, results pending. 

## 2018-10-18 NOTE — ED Provider Notes (Signed)
[facility] EMERGENCY DEPARTMENT Provider Note   CSN: [id number] Arrival date & time: 10/18/18  0446    History   Chief Complaint Chief Complaint  Patient presents with  . Unresponsive    HPI Kimberly Howell is a 73 y.o. female.   The history is provided by the EMS personnel. The history is limited by the condition of the patient (Unresponsive).  She has history of sleep apnea and reportedly was having difficulty with her CPAP mask last night.  Family found her unresponsive and called for ambulance.  EMS inserted a nasal trumpet and placed her on oxygen and brought her straight in.  An initial oxygen saturation was 35%.  No other history is available.  No past medical history on file.  There are no active problems to display for this patient.   ** The histories are not reviewed yet. Please review them in the "History" navigator section and refresh this [place].   OB History   No obstetric history on file.      Home Medications    Prior to Admission medications   Not on File    Family History No family history on file.  Social History Social History   Tobacco Use  . Smoking status: Not on file  Substance Use Topics  . Alcohol use: Not on file  . Drug use: Not on file     Allergies   Patient has no known allergies.   Review of Systems Review of Systems  Unable to perform ROS: Patient unresponsive     Physical Exam Updated Vital Signs BP (!) 80/53   Pulse 64   Temp (!) 95.8 F (35.4 C) (Tympanic)   Resp 14   Wt (!) 150 kg   SpO2 99%   Physical Exam Vitals signs and nursing note reviewed.    Morbidly obese 73 year old female, resting comfortably and in no acute distress. Vital signs are significant for low blood pressure. Oxygen saturation is 99%, which is normal. Head is normocephalic and atraumatic.  Pupils are 3 mm and minimally reactive.  Unable to test for EOM. Oropharynx is clear.  Gag reflex is intact. Neck is  supple without adenopathy or JVD. Lungs are clear without rales, wheezes, or rhonchi. Chest moves symmetrically. Heart has regular rate and rhythm without murmur. Abdomen is soft, flat, nontender without masses or hepatosplenomegaly and peristalsis is normoactive. Extremities have 1+ edema, full range of motion is present. Skin is warm and dry without rash. Neurologic: Unresponsive to deep painful stimuli.  Gag reflex and corneal reflexes are intact..  ED Treatments / Results  Labs (all labs ordered are listed, but only abnormal results are displayed) Labs Reviewed  CBC - Abnormal; Notable for the following components:      Result Value   RBC 3.36 (*)    Hemoglobin 10.1 (*)    HCT 34.0 (*)    MCV 101.2 (*)    MCHC 29.7 (*)    All other components within normal limits  COMPREHENSIVE METABOLIC PANEL - Abnormal; Notable for the following components:   Glucose, Bld 268 (*)    BUN 26 (*)    Creatinine, Ser 2.62 (*)    Calcium 8.3 (*)    Total Protein 6.3 (*)    Albumin 3.1 (*)    GFR calc non Af Amer 18 (*)    GFR calc Af Amer 21 (*)    All other components within normal limits  URINALYSIS, ROUTINE W REFLEX MICROSCOPIC -  Abnormal; Notable for the following components:   Glucose, UA 50 (*)    Protein, ur 30 (*)    Bacteria, UA RARE (*)    All other components within normal limits  LACTIC ACID, PLASMA - Abnormal; Notable for the following components:   Lactic Acid, Venous 6.5 (*)    All other components within normal limits  LACTIC ACID, PLASMA - Abnormal; Notable for the following components:   Lactic Acid, Venous 3.3 (*)    All other components within normal limits  LACTIC ACID, PLASMA - Abnormal; Notable for the following components:   Lactic Acid, Venous 2.0 (*)    All other components within normal limits  GLUCOSE, CAPILLARY - Abnormal; Notable for the following components:   Glucose-Capillary 67 (*)    All other components within normal limits  GLUCOSE, CAPILLARY -  Abnormal; Notable for the following components:   Glucose-Capillary 54 (*)    All other components within normal limits  GLUCOSE, CAPILLARY - Abnormal; Notable for the following components:   Glucose-Capillary 69 (*)    All other components within normal limits  GLUCOSE, CAPILLARY - Abnormal; Notable for the following components:   Glucose-Capillary 100 (*)    All other components within normal limits  CBG MONITORING, ED - Abnormal; Notable for the following components:   Glucose-Capillary 262 (*)    All other components within normal limits  POCT I-STAT 7, (LYTES, BLD GAS, ICA,H+H) - Abnormal; Notable for the following components:   pH, Arterial 7.284 (*)    pCO2 arterial 63.3 (*)    pO2, Arterial 59.0 (*)    Bicarbonate 30.5 (*)    HCT 32.0 (*)    Hemoglobin 10.9 (*)    All other components within normal limits  SARS CORONAVIRUS 2 (HOSPITAL ORDER, [facility])  PROTIME-INR  APTT  DIFFERENTIAL  RAPID URINE DRUG SCREEN, HOSP PERFORMED  ETHANOL  HEMOGLOBIN A1C  LACTIC ACID, PLASMA  GLUCOSE, CAPILLARY  GLUCOSE, CAPILLARY  HIV ANTIBODY (ROUTINE TESTING W REFLEX)  COMPREHENSIVE METABOLIC PANEL  CBC  CBG MONITORING, ED    EKG EKG Interpretation  Date/Time:  Monday Oct 18 2018 04:57:35 EDT Ventricular Rate:  67 PR Interval:    QRS Duration: 91 QT Interval:  434 QTC Calculation: 459 R Axis:   9 Text Interpretation:  Sinus rhythm Prolonged PR interval Low voltage, precordial leads Abnormal R-wave progression, early transition No old tracing to compare Confirmed by Delora Fuel ([id number]) on 10/18/2018 5:29:22 AM   Radiology Dg Chest Portable 1 View  Result Date: 10/18/2018 CLINICAL DATA:  Unresponsive.  Seizure. EXAM: PORTABLE CHEST 1 VIEW COMPARISON:  None. FINDINGS: Cardiomegaly and vascular pedicle widening. Borderline vascular congestion accentuated by low volumes. No Kerley lines, air bronchogram, effusion, or pneumothorax. Probable atelectasis at  the bases. Prominent bilateral glenohumeral osteoarthritis. IMPRESSION: 1. Low volume chest with probable atelectasis at the bases. 2. Cardiomegaly. Electronically Signed   By: Monte Fantasia M.D.   On: 10/18/2018 05:27    Procedures Procedures  CRITICAL CARE Performed by: Delora Fuel Total critical care time: 135 minutes Critical care time was exclusive of separately billable procedures and treating other patients. Critical care was necessary to treat or prevent imminent or life-threatening deterioration. Critical care was time spent personally by me on the following activities: development of treatment plan with patient and/or surrogate as well as nursing, discussions with consultants, evaluation of patient's response to treatment, examination of patient, obtaining history from patient or surrogate, ordering and performing treatments  and interventions, ordering and review of laboratory studies, ordering and review of radiographic studies, pulse oximetry and re-evaluation of patient's condition.  Medications Ordered in ED Medications  naloxone (NARCAN) injection 0.4 mg (has no administration in time range)  LORazepam (ATIVAN) injection 1 mg (has no administration in time range)  levETIRAcetam (KEPPRA) IVPB 1000 mg/100 mL premix (has no administration in time range)  LORazepam (ATIVAN) 2 MG/ML injection (has no administration in time range)  naloxone [facility]) injection ( Intravenous Canceled Entry 10/18/18 0515)     Initial Impression / Assessment and Plan / ED Course  I have reviewed the triage vital signs and the nursing notes.  Pertinent labs & imaging results that were available during my care of the patient were reviewed by me and considered in my medical decision making (see chart for details).  Patient presenting unresponsive with constricted pupils.  Initial concern was for opioid overdose and she is given naloxone without any obvious response.  Labs are sent for routine screening  studies.  Patient did start to wake up and became somewhat agitated but nonconversant and would not follow commands.  She then had a generalized seizure.  Seizure lasted about 1 minute and stopped without her receiving any medication.  This certainly makes it likely that her initial presentation was because of postictal state.  She is given a loading dose of levetiracetam.  She is being sent for CT of the head.  Old records are reviewed confirming outpatient management of sleep apnea.  CT of head showed no acute process.  Chest x-ray was significant only for cardiomegaly.  Lactic acid is elevated and this is felt to represent recent seizure and not sepsis.  Urinalysis is unremarkable and drug screen is normal.  Labs show macrocytic anemia and renal insufficiency which are not significantly changed from baseline.  Patient did wake up and was somewhat agitated and was given sedation with haloperidol.  Case is discussed with Dr. Lorin Mercy of [facility] who agrees to admit the patient.  Final Clinical Impressions(s) / ED Diagnoses   Final diagnoses:  Altered mental status, unspecified altered mental status type  Seizure ([place])  Elevated lactic acid level  Acute and chronic respiratory failure with hypercapnia ([place])  Renal insufficiency  Macrocytic anemia    ED Discharge Orders    None       Delora Fuel, MD 49/70/26 [id number]

## 2018-10-18 NOTE — Progress Notes (Signed)
CRITICAL VALUE ALERT  Critical Value:  Lactic acid 2.0 Date & Time Notied:  10/18/2018 1049 Provider Notified:Dr. Lorin Mercy notified   Orders Received/Actions taken: no new orders at this time

## 2018-10-19 DIAGNOSIS — Z6841 Body Mass Index (BMI) 40.0 and over, adult: Secondary | ICD-10-CM | POA: Diagnosis not present

## 2018-10-19 DIAGNOSIS — Z9989 Dependence on other enabling machines and devices: Secondary | ICD-10-CM

## 2018-10-19 DIAGNOSIS — I10 Essential (primary) hypertension: Secondary | ICD-10-CM | POA: Diagnosis not present

## 2018-10-19 DIAGNOSIS — E872 Acidosis: Secondary | ICD-10-CM

## 2018-10-19 DIAGNOSIS — N179 Acute kidney failure, unspecified: Secondary | ICD-10-CM

## 2018-10-19 DIAGNOSIS — G9340 Encephalopathy, unspecified: Secondary | ICD-10-CM | POA: Diagnosis not present

## 2018-10-19 DIAGNOSIS — G9341 Metabolic encephalopathy: Secondary | ICD-10-CM | POA: Diagnosis not present

## 2018-10-19 DIAGNOSIS — G4733 Obstructive sleep apnea (adult) (pediatric): Secondary | ICD-10-CM | POA: Diagnosis not present

## 2018-10-19 LAB — COMPREHENSIVE METABOLIC PANEL
ALT: 24 U/L (ref 0–44)
AST: 45 U/L — ABNORMAL HIGH (ref 15–41)
Albumin: 2.9 g/dL — ABNORMAL LOW (ref 3.5–5.0)
Alkaline Phosphatase: 81 U/L (ref 38–126)
Anion gap: 10 (ref 5–15)
BUN: 20 mg/dL (ref 8–23)
CO2: 28 mmol/L (ref 22–32)
Calcium: 8.6 mg/dL — ABNORMAL LOW (ref 8.9–10.3)
Chloride: 103 mmol/L (ref 98–111)
Creatinine, Ser: 1.75 mg/dL — ABNORMAL HIGH (ref 0.44–1.00)
GFR calc Af Amer: 34 mL/min — ABNORMAL LOW (ref >60)
GFR calc non Af Amer: 29 mL/min — ABNORMAL LOW (ref >60)
Glucose, Bld: 106 mg/dL — ABNORMAL HIGH (ref 70–99)
Potassium: 4.1 mmol/L (ref 3.5–5.1)
Sodium: 141 mmol/L (ref 135–145)
Total Bilirubin: 0.8 mg/dL (ref 0.3–1.2)
Total Protein: 6.1 g/dL — ABNORMAL LOW (ref 6.5–8.1)

## 2018-10-19 LAB — CBC
HCT: 25.1 % — ABNORMAL LOW (ref 36.0–46.0)
Hemoglobin: 8.1 g/dL — ABNORMAL LOW (ref 12.0–15.0)
MCH: 30.6 pg (ref 26.0–34.0)
MCHC: 32.3 g/dL (ref 30.0–36.0)
MCV: 94.7 fL (ref 80.0–100.0)
Platelets: 176 10*3/uL (ref 150–400)
RBC: 2.65 MIL/uL — ABNORMAL LOW (ref 3.87–5.11)
RDW: 12.5 % (ref 11.5–15.5)
WBC: 10.4 10*3/uL (ref 4.0–10.5)
nRBC: 0 % (ref 0.0–0.2)

## 2018-10-19 LAB — GLUCOSE, CAPILLARY
Glucose-Capillary: 115 mg/dL — ABNORMAL HIGH (ref 70–99)
Glucose-Capillary: 102 mg/dL — ABNORMAL HIGH (ref 70–99)

## 2018-10-19 LAB — HIV ANTIBODY (ROUTINE TESTING W REFLEX): HIV Screen 4th Generation wRfx: NONREACTIVE

## 2018-10-19 MED ORDER — ACETAMINOPHEN 325 MG PO TABS: 650.0000 mg | ORAL_TABLET | Freq: Four times a day (QID) | ORAL | Status: AC | PRN

## 2018-10-19 MED ORDER — AMLODIPINE BESYLATE 10 MG PO TABS: 10.0000 mg | ORAL_TABLET | Freq: Every day | ORAL | 11 refills | Status: AC

## 2018-10-19 MED FILL — AMLODIPINE BESYLATE 10 MG T: 10 | 30 days supply | Qty: 30 | Fill #0 | Status: TO

## 2018-10-19 NOTE — Plan of Care (Signed)
  Problem: Education: Goal: Knowledge of General Education information will improve Description Including pain rating scale, medication(s)/side effects and non-pharmacologic comfort measures Outcome: Adequate for Discharge   Problem: Health Behavior/Discharge Planning: Goal: Ability to manage health-related needs will improve Outcome: Progressing   Problem: Clinical Measurements: Goal: Ability to maintain clinical measurements within normal limits will improve Outcome: Adequate for Discharge Goal: Will remain free from infection Outcome: Progressing Goal: Diagnostic test results will improve Outcome: Adequate for Discharge Goal: Respiratory complications will improve Outcome: Adequate for Discharge Goal: Cardiovascular complication will be avoided Outcome: Adequate for Discharge   Problem: Clinical Measurements: Goal: Will remain free from infection Outcome: Progressing   Problem: Clinical Measurements: Goal: Will remain free from infection Outcome: Progressing   Problem: Activity: Goal: Risk for activity intolerance will decrease Outcome: Adequate for Discharge   Problem: Nutrition: Goal: Adequate nutrition will be maintained Outcome: Adequate for Discharge   Problem: Coping: Goal: Level of anxiety will decrease Outcome: Adequate for Discharge   Problem: Elimination: Goal: Will not experience complications related to bowel motility Outcome: Adequate for Discharge Goal: Will not experience complications related to urinary retention Outcome: Completed/Met   Problem: Pain Managment: Goal: General experience of comfort will improve Outcome: Completed/Met   Problem: Safety: Goal: Ability to remain free from injury will improve Outcome: Adequate for Discharge   Problem: Skin Integrity: Goal: Risk for impaired skin integrity will decrease Outcome: Completed/Met    General admission and diagnostic information discussed with patient, who relayed appropriate  information to her son via telephone.  PT and OT to eval for discharge.  Patient states is anxious to go home.  Safety measures reviewed.

## 2018-10-19 NOTE — Discharge Summary (Signed)
Physician Discharge Summary  Kimberly Howell:[id number] DOB: February 10, 1946 DOA: 10/18/2018  PCP: Seward Carol, MD  Admit date: 10/18/2018 Discharge date: 10/19/2018  Admitted From: Home Disposition: Home  Recommendations for Outpatient Follow-up:  1. Follow up with PCP in 1-2 weeks 2. Please obtain CBC/BMP/Mag at follow up 3. Please follow up on the following pending results: None  Home Health: None Equipment/Devices: None  Discharge Condition: Stable CODE STATUS: Full code  Hospital Course: 73 y.o. female with medical history significant of OSA on CPAP; HTN; HLD; DM type II; CKD stage III; RLS; COPD on 3L home O2; and morbid obesity (BMI 50-59) presenting with AMS and seizure-like activity at home per patient's son who called EMS.  In ED, hypotensive and hypothermic.  GCS 3.  ABG with acute on chronic respiratory acidosis.  Creatinine 2.62 (unknown baseline) but history of CKD-3.  BUN 26.  Glucose 268.  Otherwise CMP not impressive.  CBC significant for hemoglobin to 10.1 and MCV to 101 otherwise not impressive.  COVID-19 negative.  Chest x-ray without acute finding.  EKG without acute ischemic finding.  Became agitated and was given Haldol.  Head CT without acute finding.  She had another witnessed seizure in ED.  She was loaded with Keppra per neurology recommendation.  Lactate was 6.5 and normalized the next day.    The next day, patient remained stable.  Awake, alert oriented x4. EEG did not show epileptiform discharge.  She remained seizure-free.  Seizure-like activity thought to be due to her respiratory failure.  Cleared by neurology for discharge and outpatient follow-up for possible Parkinson's disease.  Oxygen saturation in 90s on home level of oxygen.  Patient's condition was discussed with patient's son over the phone prior to discharge.  She was discharged home to follow-up with PCP and neurology.  See individual problem list below for more.  Discharge Diagnoses:    Principal Problem:   Acute encephalopathy Active Problems:   Morbid obesity with BMI of 50.0-59.9, adult (HCC)   Chronic respiratory failure (HCC)   CKD (chronic kidney disease) stage 3, GFR 30-59 ml/min (HCC)   Essential hypertension   OSA on CPAP  Acute metabolic encephalopathy: Likely due to acute on chronic hypoxemic and hypercarbic respiratory failure respiratory failure. -Encephalopathy has resolved -Recommended taking Tylenol around-the-clock for pain -Advised to take a Norco only for severe pain  Seizure-like activity: thought to be due to acute hypoxemic and hypercarbic respiratory failure.  Lactic acidosis resolved.  Remained stable.  EEG without epileptiform discharge. -Discharged home to follow-up with neurology in 4 to 5 weeks for possible Parkinson disease. -No AED per neurology. -Advised to stop taking tramadol  Acute on chronic hypoxemic and hypercarbic respiratory failure in patient with OSA/possible OHS.  Resolved.  Saturating in 90s on 3 L by nasal cannula (home level) when ambulated by PT. -Discharged home on home oxygen and CPAP. -Encouraged her to get in touch with his CPAP company to make sure the CPAP is operating appropriately. -Home health nurse and aide ordered on discharge.  AKI on CKD 3: Improved.  Likely a combination of prerenal (hypertension) and ATN due to ACE inhibitor's.  Hypotension resolved. -ACE inhibitor was discontinued -Started on amlodipine on discharge for hypertension. -Recommend BMP at follow-up.  Hypertension: Normotensive -Medication change as above  Well-controlled NIDDM-3 with renal complication: U8E 2.8%.  Had hypoglycemic to 54 late yesterday afternoon.  Currently normoglycemic -Discharged on home Januvia.  Other chronic medical conditions stable.  Discharge Instructions  Discharge Instructions  Call MD for:  difficulty breathing, headache or visual disturbances   Complete by:  As directed    Call MD for:  persistant  dizziness or light-headedness   Complete by:  As directed    Call MD for:  persistant nausea and vomiting   Complete by:  As directed    Call MD for:  temperature >100.4   Complete by:  As directed    Diet - low sodium heart healthy   Complete by:  As directed    Discharge instructions   Complete by:  As directed    It has been a pleasure taking care of you! You were admitted with seizure-like activity.  This is likely due to your breathing issue.  Continue using CPAP nightly and oxygen during the daytime.  You may reach out to your CPAP company to ensure the CPAP is working appropriately.  We also recommend stopping tramadol as it increases your chance of having another seizure.  Take your Norco (Vicodin) only as needed for severe pain.  You may take Tylenol 3-4 times a day as needed.  Follow-up with your primary care doctor in 1 to 2 weeks.  Follow-up with neurology in 4 to 6 weeks.  There could be some changes to medication during this hospitalization.  Please review medication list and and directions before you take your medications.  Once you are discharged, your primary care physician will handle any further medical issues. Please note that NO REFILLS for any discharge medications will be authorized once you are discharged, as it is imperative that you return to your primary care physician (or establish a relationship with a primary care physician if you do not have one) for your aftercare needs so that they can reassess your need for medications and monitor your lab values.  Take care,   Increase activity slowly   Complete by:  As directed      Allergies as of 10/19/2018   No Known Allergies     Medication List    STOP taking these medications   lisinopril 40 MG tablet Commonly known as:  ZESTRIL     TAKE these medications   acetaminophen 325 MG tablet Commonly known as:  TYLENOL Take 2 tablets (650 mg total) by mouth every 6 (six) hours as needed for mild pain (or temp >/=  99.5).   amLODipine 10 MG tablet Commonly known as:  NORVASC Take 1 tablet (10 mg total) by mouth daily.   aspirin EC 81 MG tablet Take 81 mg by mouth daily.   Centravites 50 Plus Tabs Take 1 tablet by mouth daily.   cetirizine 10 MG tablet Commonly known as:  ZYRTEC Take 10 mg by mouth daily.   colchicine 0.6 MG tablet Take 0.6 mg by mouth daily as needed (gout pain).   diclofenac sodium 1 % Gel Commonly known as:  VOLTAREN Apply 2 g topically 4 (four) times daily as needed (pain).   febuxostat 40 MG tablet Commonly known as:  ULORIC Take 80 mg by mouth daily.   ferrous sulfate 325 (65 FE) MG EC tablet Take 325 mg by mouth 2 (two) times daily with a meal.   HYDROcodone-acetaminophen 10-325 MG tablet Commonly known as:  NORCO Take 1 tablet by mouth daily as needed for moderate pain.   pramipexole 0.25 MG tablet Commonly known as:  MIRAPEX Take 0.25 mg by mouth daily.   sitaGLIPtin 100 MG tablet Commonly known as:  JANUVIA Take 50 mg by mouth daily.  Ventolin HFA 108 (90 Base) MCG/ACT inhaler Generic drug:  albuterol Take 2 puffs by mouth every 6 (six) hours as needed for wheezing or shortness of breath.      Follow-up Information    Seward Carol, MD. Schedule an appointment as soon as possible for a visit in 1 week(s).   Specialty:  Internal Medicine Contact information: [address] [place] [address] [phone]        [facility] Follow up.   Why:  home health services arranged [phone]          Consultations:  Neurology  Procedures/Studies:  2D Echo: None obtained this admission  Ct Head Wo Contrast  Result Date: 10/18/2018 CLINICAL DATA:  Found unresponsive this morning EXAM: CT HEAD WITHOUT CONTRAST TECHNIQUE: Contiguous axial images were obtained from the base of the skull through the vertex without intravenous contrast. COMPARISON:  None. FINDINGS: Brain: No evidence of acute infarction,  hemorrhage, hydrocephalus, extra-axial collection or mass lesion/mass effect. Probable mild chronic small vessel ischemic change in the cerebral white matter Vascular: No hyperdense vessel or unexpected calcification. Skull: Normal. Negative for fracture or focal lesion. Sinuses/Orbits: There is an opacified left ethmoid air cell with expansion and broad bony defect at the fovea ethmoidalis. Other: Mild image blurring likely from motion IMPRESSION: 1. No acute finding. 2. 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 Recommend ENT referral after convalescence. Electronically Signed   By: Monte Fantasia M.D.   On: 10/18/2018 05:44   Dg Chest Portable 1 View  Result Date: 10/18/2018 CLINICAL DATA:  Unresponsive.  Seizure. EXAM: PORTABLE CHEST 1 VIEW COMPARISON:  None. FINDINGS: Cardiomegaly and vascular pedicle widening. Borderline vascular congestion accentuated by low volumes. No Kerley lines, air bronchogram, effusion, or pneumothorax. Probable atelectasis at the bases. Prominent bilateral glenohumeral osteoarthritis. IMPRESSION: 1. Low volume chest with probable atelectasis at the bases. 2. Cardiomegaly. Electronically Signed   By: Monte Fantasia M.D.   On: 10/18/2018 05:27     Subjective: No major events overnight of this morning.  Very eager to go home.  She is eating mostly.  Has had very little recollection of what happened yesterday.  Denies headache, vision change, chest pain, dyspnea, GI or GU symptoms.  Denies NSAID use.  He denies any medication.   Discharge Exam: Vitals:   10/19/18 1232 10/19/18 1437  BP:  (!) 89/77  Pulse:  69  Resp:  18  Temp:  98.1 F (36.7 C)  SpO2: (!) 81%     GENERAL: No acute distress.  Appears well.  HEENT: MMM.  Vision and hearing grossly intact.  NECK: Supple.  No apparent JVD. LUNGS:  No IWOB.  Fair air movement bilaterally. HEART:  RRR. Heart sounds normal.  ABD: Bowel sounds present. Soft. Non tender.  MSK/EXT:  Moves  all extremities. No apparent deformity. No edema bilaterally. SKIN: no apparent skin lesion or wound NEURO: Awake, alert and oriented appropriately.  Cranial, motor and light sensation intact.  No pronator drift.  Finger-to-nose intact. PSYCH: Calm. Normal affect.    The results of significant diagnostics from this hospitalization (including imaging, microbiology, ancillary and laboratory) are listed below for reference.     Microbiology: Recent Results (from the past 240 hour(s))  SARS Coronavirus 2 [facility] order, Performed in [facility] hospital lab)     Status: None   Collection Time: 10/18/18  5:07 AM  Result Value Ref Range Status   SARS Coronavirus 2 NEGATIVE NEGATIVE Final  Comment: (NOTE) If result is NEGATIVE SARS-CoV-2 target nucleic acids are NOT DETECTED. The SARS-CoV-2 RNA is generally detectable in upper and lower  respiratory specimens during the acute phase of infection. The lowest  concentration of SARS-CoV-2 viral copies this assay can detect is 250  copies / mL. A negative result does not preclude SARS-CoV-2 infection  and should not be used as the sole basis for treatment or other  patient management decisions.  A negative result may occur with  improper specimen collection / handling, submission of specimen other  than nasopharyngeal swab, presence of viral mutation(s) within the  areas targeted by this assay, and inadequate number of viral copies  (<250 copies / mL). A negative result must be combined with clinical  observations, patient history, and epidemiological information. If result is POSITIVE SARS-CoV-2 target nucleic acids are DETECTED. The SARS-CoV-2 RNA is generally detectable in upper and lower  respiratory specimens dur ing the acute phase of infection.  Positive  results are indicative of active infection with SARS-CoV-2.  Clinical  correlation with patient history and other diagnostic information is  necessary to determine patient  infection status.  Positive results do  not rule out bacterial infection or co-infection with other viruses. If result is PRESUMPTIVE POSTIVE SARS-CoV-2 nucleic acids MAY BE PRESENT.   A presumptive positive result was obtained on the submitted specimen  and confirmed on repeat testing.  While 2019 novel coronavirus  (SARS-CoV-2) nucleic acids may be present in the submitted sample  additional confirmatory testing may be necessary for epidemiological  and / or clinical management purposes  to differentiate between  SARS-CoV-2 and other Sarbecovirus currently known to infect humans.  If clinically indicated additional testing with an alternate test  methodology [phone]) is advised. The SARS-CoV-2 RNA is generally  detectable in upper and lower respiratory sp ecimens during the acute  phase of infection. The expected result is Negative. Fact Sheet for Patients:  StrictlyIdeas.no Fact Sheet for Healthcare Providers: BankingDealers.co.za This test is not yet approved or cleared by the [place] FDA and has been authorized for detection and/or diagnosis of SARS-CoV-2 by FDA under an Emergency Use Authorization (EUA).  This EUA will remain in effect (meaning this test can be used) for the duration of the COVID-19 declaration under Section 564(b)(1) of the Act, 21 U.S.C. section 360bbb-3(b)(1), unless the authorization is terminated or revoked sooner. Performed at [facility], [place] [address] [place], [place] [address]      Labs: BNP (last 3 results) No results for input(s): BNP in the last 8760 hours. Basic Metabolic Panel: Recent Labs  Lab 10/18/18 0457 10/18/18 0608 10/19/18 0409  NA 138 137 141  K 5.0 4.6 4.1  CL 98  --  103  CO2 25  --  28  GLUCOSE 268*  --  106*  BUN 26*  --  20  CREATININE 2.62*  --  1.75*  CALCIUM 8.3*  --  8.6*   Liver Function Tests: Recent Labs  Lab 10/18/18 0457 10/19/18 0409  AST 35  45*  ALT 24 24  ALKPHOS 87 81  BILITOT 0.6 0.8  PROT 6.3* 6.1*  ALBUMIN 3.1* 2.9*   No results for input(s): LIPASE, AMYLASE in the last 168 hours. No results for input(s): AMMONIA in the last 168 hours. CBC: Recent Labs  Lab 10/18/18 0457 10/18/18 0608 10/19/18 0409  WBC 9.7  --  10.4  NEUTROABS 7.6  --   --   HGB 10.1* 10.9* 8.1*  HCT 34.0* 32.0* 25.1*  MCV 101.2*  --  94.7  PLT 187  --  176   Cardiac Enzymes: No results for input(s): CKTOTAL, CKMB, CKMBINDEX, TROPONINI in the last 168 hours. BNP: Invalid input(s): POCBNP CBG: Recent Labs  Lab 10/18/18 1705 10/18/18 1726 10/18/18 2037 10/19/18 0806 10/19/18 1207  GLUCAP 69* 100* 99 115* 102*   D-Dimer No results for input(s): DDIMER in the last 72 hours. Hgb A1c Recent Labs    10/18/18 0457  HGBA1C 5.1   Lipid Profile No results for input(s): CHOL, HDL, LDLCALC, TRIG, CHOLHDL, LDLDIRECT in the last 72 hours. Thyroid function studies No results for input(s): TSH, T4TOTAL, T3FREE, THYROIDAB in the last 72 hours.  Invalid input(s): FREET3 Anemia work up No results for input(s): VITAMINB12, FOLATE, FERRITIN, TIBC, IRON, RETICCTPCT in the last 72 hours. Urinalysis    Component Value Date/Time   COLORURINE YELLOW 10/18/2018 0550   APPEARANCEUR CLEAR 10/18/2018 0550   LABSPEC 1.014 10/18/2018 0550   PHURINE 5.0 10/18/2018 0550   GLUCOSEU 50 (A) 10/18/2018 0550   HGBUR NEGATIVE 10/18/2018 0550   BILIRUBINUR NEGATIVE 10/18/2018 0550   KETONESUR NEGATIVE 10/18/2018 0550   PROTEINUR 30 (A) 10/18/2018 0550   NITRITE NEGATIVE 10/18/2018 0550   LEUKOCYTESUR NEGATIVE 10/18/2018 0550   Sepsis Labs Invalid input(s): PROCALCITONIN,  WBC,  LACTICIDVEN   Time coordinating discharge: 40 minutes  SIGNED:  Mercy Riding, MD  [facility] 10/19/2018, 6:23 PM Pager [phone]  If 7PM-7AM, please contact night-coverage [email or web address] Password [email or web address]

## 2018-10-19 NOTE — Progress Notes (Signed)
Patient discharged to home via private vehicle driven by sister.  Escorted via wheelchair by nurse tech and utilizing portable oxygen per her needs.

## 2018-10-19 NOTE — Care Management Obs Status (Signed)
[place] NOTIFICATION   Patient Details  Name: Kimberly Howell MRN: [id number] Date of Birth: February 21, 1946   Medicare Observation Status Notification Given:  Yes    Sharin Mons, RN 10/19/2018, 1:59 PM

## 2018-10-19 NOTE — TOC Transition Note (Signed)
Transition of Care [facility]) - CM/SW Discharge Note   Patient Details  Name: Kimberly Howell MRN: [id number] Date of Birth: 1946/04/21  Transition of Care [facility] CM/SW Contact:  Sharin Mons, RN Phone Number: 10/19/2018, 3:07 PM   Clinical Narrative:   Presented with acute encephalopathy.  Transition to home today. Hx of OSA, CPAP, HTN, DM2, CKD stage III, COPD on home O2 (pt reports up to 4L), and morbid obesity.  Pt agreeable to home health services.Choice list given per NCM. Pt selected [place]. Referral made to [facility] by NCM for home health services. States has transportation to home.   Final next level of care: [place] Barriers to Discharge: Barriers Resolved   Patient Goals and CMS Choice Patient states their goals for this hospitalization and ongoing recovery are:: to go home CMS Medicare.gov Compare Post Acute Care list provided to:: Patient Choice offered to / list presented to : Patient  Discharge Placement                       Discharge Plan and Services In-house Referral: NA Discharge Planning Services: NA Post Acute Care Choice: NA          DME Arranged: N/A DME Agency: NA       HH Arranged: RN, Nurse's Aide [place] Agency: [place] ([facility]) Date HH Agency Contacted: 10/19/18 Time [place]: 1504 Representative spoke with at [place]: [place] ([place]) Interventions     Readmission Risk Interventions No flowsheet data found.

## 2018-10-19 NOTE — Evaluation (Signed)
Physical Therapy Evaluation/Discharge Patient Details Name: Kimberly Howell MRN: [id number] DOB: 07/09/1945 Today's Date: 10/19/2018   History of Present Illness  73 y.o. female admitted on 10/18/18 for AMS and possible seizures.  Her lactate was 6.5.  Neurology consulted and EEG completed.  Possible seizure in the setting of hypoxia/hypercarbia.  Pt with other significant PMH of OSA, CPAP, HTN, DM2, CKD stage III, COPD on home O2 (pt reports up to 4L), and morbid obesity.    Clinical Impression  Pt mobilizes well with use of RW around the room.  She reports she normally takes her O2 off to get up and do some things around the house, however, she dropped to 81% on RA with short distance gait and then stated, "that's good".  I educated her that our preference is for her O2 to be 90 or higher and that she should likely wear her O2 at all times when getting up and moving around at home.  She does have a finger pulse ox monitor.  She is interested in a home aide, so I alerted MD and RN case manager to see if she qualifies.  PT to sign off as pt is at her baseline and mobilizes well with use of RW.      Follow Up Recommendations No PT follow up;Other (comment)(pt is interested to see if she can qualify for an aide)    Equipment Recommendations  None recommended by PT    Recommendations for Other Services   NA    Precautions / Restrictions Precautions Precautions: Other (comment) Precaution Comments: monitor O2 sats with activity      Mobility  Bed Mobility               General bed mobility comments: Pt was OOB in the recliner chair.   Transfers Overall transfer level: Modified independent Equipment used: Rolling walker (2 wheeled)             General transfer comment: Pt able to get up with use of hands on armrests for transitions.   Ambulation/Gait Ambulation/Gait assistance: Supervision Gait Distance (Feet): 45 Feet Assistive device: Rolling walker (2 wheeled) Gait  Pattern/deviations: Step-through pattern     General Gait Details: Pt was able to walk back and forth to the door in her room several times (did not want to go into the hallway).  She was steady with RW use and reports she normally take her O2 off to "exercise".  No DOE, however, O2 sats dropped to 81% with gait on RA.  Increased to 94% on 3 L O2 [place].  I encouraged pt to leave O2 on during acitivity at home and on appropriate O2 saturation numbers.           Balance Overall balance assessment: Needs assistance Sitting-balance support: Feet supported;No upper extremity supported Sitting balance-Leahy Scale: Good     Standing balance support: No upper extremity supported;Single extremity supported Standing balance-Leahy Scale: Fair Standing balance comment: prefers to have one hand supported in standing for balance.                              Pertinent Vitals/Pain Pain Assessment: No/denies pain    Home Living Family/patient expects to be discharged to:: Private residence Living Arrangements: Children(son who works) Available Help at Discharge: Family;Available PRN/intermittently Type of Home: House       Home Layout: One level Home Equipment: [place] - 4 wheels;Shower seat Additional Comments: Home  O2 per pt she uses "up to 4L" also has a finger pusle ox    Prior Function Level of Independence: Needs assistance   Gait / Transfers Assistance Needed: walks with cane vs rollator depending on the day and how she feels. Per pt she takes her O2 off when she gets up to move around.    ADL's / Homemaking Assistance Needed: Sisters help with cooking and cleaning, she is interested in an Social worker [place]   Dominant Hand: Right    Extremity/Trunk Assessment   Upper Extremity Assessment Upper Extremity Assessment: Defer to OT evaluation    Lower Extremity Assessment Lower Extremity Assessment: Overall WFL for tasks assessed    Cervical / Trunk  Assessment Cervical / Trunk Assessment: Normal  Communication   Communication: No difficulties  Cognition Arousal/Alertness: Awake/alert Behavior During Therapy: WFL for tasks assessed/performed Overall Cognitive Status: Within Functional Limits for tasks assessed                                 General Comments: Needs education re: O2 saturations.  When told she dropped to 81% during gait on RA she stated, "oh, that's good".  I educated her that low 80s is not ideal and that our preference is for her to be in the 90s.  She needs to wear her O2 when up and moving around the house.               Assessment/Plan    PT Assessment [place] does not need any further PT services         PT Goals (Current goals can be found in the Care Plan section)  Acute Rehab PT Goals Patient Stated Goal: to go home today PT Goal Formulation: With patient Time For Goal Achievement: 11/02/18 Potential to Achieve Goals: Good               AM-PAC PT "6 Clicks" Mobility  Outcome Measure Help needed turning from your back to your side while in a flat bed without using bedrails?: None Help needed moving from lying on your back to sitting on the side of a flat bed without using bedrails?: None Help needed moving to and from a bed to a chair (including a wheelchair)?: None Help needed standing up from a chair using your arms (e.g., wheelchair or bedside chair)?: None Help needed to walk in hospital room?: None Help needed climbing 3-5 steps with a railing? : A Little 6 Click Score: 23    End of Session Equipment Utilized During Treatment: Oxygen Activity Tolerance: Patient tolerated treatment well Patient left: in chair;with call bell/phone within reach;Other (comment)(with OT coming in to assess pt.) Nurse Communication: Mobility status;Other (comment)(needs to wear O2, would like to get an aide) PT Visit Diagnosis: Difficulty in walking, not elsewhere classified (R26.2)    Time:  3335-4562 PT Time Calculation (min) (ACUTE ONLY): 16 min   Charges:       Wells Guiles B. Ragina Fenter, PT, DPT  Acute Rehabilitation #[phone] pager #[phone] office   PT Evaluation $PT Eval Moderate Complexity: 1 Mod          10/19/2018, 12:45 PM

## 2018-10-19 NOTE — Progress Notes (Addendum)
NEUROLOGY PROGRESS NOTE  Subjective: Patient is significantly improved today.  She knows where she is, able to follow commands, knows that is April and the year is 2020.  She currently is getting up and walking to the bathroom.  Exam: Vitals:   10/19/18 0100 10/19/18 0443  BP: (!) 106/55 123/78  Pulse:  76  Resp:    Temp:  98.7 F (37.1 C)  SpO2:  100%    Physical Exam   HEENT-  Normocephalic, no lesions, without obvious abnormality.  Normal external eye and conjunctiva.   Extremities- Warm, dry and intact Musculoskeletal-no joint tenderness, deformity or swelling Skin-warm and dry, no hyperpigmentation, vitiligo, or suspicious lesions    Neuro:  Mental Status: Alert, oriented, thought content appropriate.  Speech fluent without evidence of aphasia.  Able to follow 3 step commands without difficulty. Cranial Nerves: II:  Visual fields grossly normal,  III,IV, VI: ptosis not present, extra-ocular motions intact bilaterally pupils equal, round, reactive to light and accommodation V,VII: smile symmetric, facial light touch sensation normal bilaterally VIII: hearing normal bilaterally IX,X: Palate rises midline XI: bilateral shoulder shrug XII: midline tongue extension Motor: 4/5 throughout Sensory: Pinprick and light touch intact throughout, bilaterally Deep Tendon Reflexes: 2+ and symmetric throughout Plantars: Right: downgoing   Left: downgoing Cerebellar: normal finger-to-nose, normal rapid alternating movements and normal heel-to-shin test Gait: Shuffling gait and slightly off balance    Medications:  Scheduled: . aspirin EC  81 mg Oral Daily  . docusate sodium  100 mg Oral BID  . enoxaparin (LOVENOX) injection  75 mg Subcutaneous Q24H  . febuxostat  80 mg Oral Daily  . insulin aspart  0-20 Units Subcutaneous TID WC  . insulin aspart  0-5 Units Subcutaneous QHS  . loratadine  10 mg Oral Daily  . pantoprazole  40 mg Oral Daily  . pramipexole  0.25 mg Oral  Daily   Continuous: . sodium chloride 75 mL/hr (10/18/18 2056)    Pertinent Labs/Diagnostics:  EEG iMPRESSION: This EEG is characterized by slowing which is consistent with normal drowse.  Can not rule out the possibility of slowing related to general cerebral disturbance such as a metabolic encephalopathy.  Clinical correlation recommended.  No epileptiform activity is noted.    Ct Head Wo Contrast  Result Date: 10/18/2018 CLINICAL DATA:  Found unresponsive this morning EXAM: CT HEAD WITHOUT CONTRAST TECHNIQUE: Contiguous axial images were obtained from the base of the skull through the vertex without intravenous contrast. COMPARISON:  None. FINDINGS: Brain: No evidence of acute infarction, hemorrhage, hydrocephalus, extra-axial collection or mass lesion/mass effect. Probable mild chronic small vessel ischemic change in the cerebral white matter Vascular: No hyperdense vessel or unexpected calcification. Skull: Normal. Negative for fracture or focal lesion. Sinuses/Orbits: There is an opacified left ethmoid air cell with expansion and broad bony defect at the fovea ethmoidalis. Other: Mild image blurring likely from motion IMPRESSION: 1. No acute finding. 2. 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 Recommend ENT referral after convalescence. Electronically Signed   By: Monte Fantasia M.D.   On: 10/18/2018 05:44   Dg Chest Portable 1 View  Result Date: 10/18/2018 CLINICAL DATA:  Unresponsive.  Seizure. EXAM: PORTABLE CHEST 1 VIEW COMPARISON:  None. FINDINGS: Cardiomegaly and vascular pedicle widening. Borderline vascular congestion accentuated by low volumes. No Kerley lines, air bronchogram, effusion, or pneumothorax. Probable atelectasis at the bases. Prominent bilateral glenohumeral osteoarthritis. IMPRESSION: 1. Low volume chest with probable atelectasis at the bases. 2. Cardiomegaly. Electronically Signed  By: Monte Fantasia M.D.   On: 10/18/2018 05:27      Etta Quill PA-C [facility] [phone]   Assessment:73 year old female with COPD and OSA, presented yesterday with AMS and lactate of 6.5 1. Possible seizure at home and subsequent witnessed seizure in the ED.  2. Hypoxia was also a likely significant contributing factor to her AMS. At home, she may have desaturated significantly and for a sustained period prior to being found by family.  3. Most likely etiology for seizure is felt to be acute hypoxia/hypercarbia  4. Possible Parkinson's per history. Recently started on pramipexole.   Recommendations: 1. Hold off on further Keppra as at this point she is completely lucid, and able to follow commands  2. Outpatient neurology follow up for possible Parkinson's. Continue pramipexole. 3. Neurology will sign off.    Electronically signed: Dr. Kerney Elbe   10/19/2018, 10:39 AM

## 2018-10-19 NOTE — Evaluation (Signed)
Occupational Therapy Evaluation Patient Details Name: Kimberly Howell MRN: [id number] DOB: 07-Aug-1945 Today's Date: 10/19/2018    History of Present Illness 73 y.o. female admitted on 10/18/18 for AMS and possible seizures.  Her lactate was 6.5.  Neurology consulted and EEG completed.  Possible seizure in the setting of hypoxia/hypercarbia.  Pt with other significant PMH of OSA, CPAP, HTN, DM2, CKD stage III, COPD on home O2 (pt reports up to 4L), and morbid obesity.     Clinical Impression   PTA, pt was living with her son and was performing ADLs and light IADLs; pt stating, "unless I am sick and then I need help with that stuff." Pt presenting near baseline function and performing ADLs and functional mobility at Mod I level. Educating pt on O2 safety and use during ADLs. SpO2 94% on 3L during ADLs. Pt planning for dc later today. Completed education and answered pt questions in preparation for dc. Recommend dc to home once medically stable per physician and would benefit from a [facility] aide to optimize safety and perform IADLs.      Follow Up Recommendations  No OT follow up;Supervision - Intermittent(HH aide)    Equipment Recommendations  None recommended by OT    Recommendations for Other Services       Precautions / Restrictions Precautions Precautions: Other (comment) Precaution Comments: monitor O2 sats with activity      Mobility Bed Mobility               General bed mobility comments: Pt was OOB in the recliner chair.   Transfers Overall transfer level: Modified independent Equipment used: None             General transfer comment: Pt demonstrating good power up and balance into standing.    Balance Overall balance assessment: Needs assistance Sitting-balance support: Feet supported;No upper extremity supported Sitting balance-Leahy Scale: Good     Standing balance support: No upper extremity supported;Single extremity supported Standing balance-Leahy  Scale: Good Standing balance comment: prefers to have one hand supported in standing for balance.                            ADL either performed or assessed with clinical judgement   ADL Overall ADL's : At baseline;Modified independent                                       General ADL Comments: Pt presenting at baseline for ADLs and functional mobility. Performing grooming and mobility in room and managing her O2 line without assistance. Educating pt on use of O2 during ADLs as well as showers. Recommending pt not take very hot showers and educating on steam and O2.     Vision         Perception     Praxis      Pertinent Vitals/Pain Pain Assessment: No/denies pain     Hand Dominance Right   Extremity/Trunk Assessment Upper Extremity Assessment Upper Extremity Assessment: Overall WFL for tasks assessed   Lower Extremity Assessment Lower Extremity Assessment: Overall WFL for tasks assessed   Cervical / Trunk Assessment Cervical / Trunk Assessment: Normal   Communication Communication Communication: No difficulties   Cognition Arousal/Alertness: Awake/alert Behavior During Therapy: WFL for tasks assessed/performed Overall Cognitive Status: Within Functional Limits for tasks assessed  General Comments: Seems at baseline for cognition. Requiring education on O2 saturation.   General Comments  SpO2 94% on 3L    Exercises     Shoulder Instructions      Home Living Family/patient expects to be discharged to:: Private residence Living Arrangements: Children(son who works) Available Help at Discharge: Family;Available PRN/intermittently Type of Home: House       Home Layout: One level     Bathroom Shower/Tub: Teacher, early years/pre: Handicapped height("higher")     Home Equipment: Environmental consultant - 4 wheels;Shower seat   Additional Comments: Home O2 per pt she uses "up to 4L" also  has a finger pusle ox      Prior Functioning/Environment Level of Independence: Needs assistance  Gait / Transfers Assistance Needed: walks with cane vs rollator depending on the day and how she feels. Per pt she takes her O2 off when she gets up to move around.   ADL's / Homemaking Assistance Needed: Sisters help with cooking and cleaning, she is interested in an aide            OT Problem List: Decreased activity tolerance;Impaired balance (sitting and/or standing);Decreased knowledge of use of DME or AE;Decreased knowledge of precautions      OT Treatment/Interventions:      OT Goals(Current goals can be found in the care plan section) Acute Rehab OT Goals Patient Stated Goal: to go home today OT Goal Formulation: All assessment and education complete, DC therapy  OT Frequency:     Barriers to D/C:            Co-evaluation              AM-PAC OT "6 Clicks" Daily Activity     Outcome Measure Help from another person eating meals?: None Help from another person taking care of personal grooming?: None Help from another person toileting, which includes using toliet, bedpan, or urinal?: None Help from another person bathing (including washing, rinsing, drying)?: None Help from another person to put on and taking off regular upper body clothing?: None Help from another person to put on and taking off regular lower body clothing?: None 6 Click Score: 24   End of Session Equipment Utilized During Treatment: Oxygen Nurse Communication: Mobility status  Activity Tolerance: Patient tolerated treatment well Patient left: in chair;with call bell/phone within reach;with nursing/sitter in room  OT Visit Diagnosis: Unsteadiness on feet (R26.81);Muscle weakness (generalized) (M62.81)                Time: 5883-2549 OT Time Calculation (min): 12 min Charges:  OT General Charges $OT Visit: 1 Visit OT Evaluation $OT Eval Low Complexity: [place],  OTR/L Acute Rehab Pager: [phone] Office: [place] 10/19/2018, 1:01 PM

## 2018-10-19 NOTE — Progress Notes (Signed)
Discharge instructions reviewed with patient.  These included  the following:  Importance of maintaining optimal oxygen saturation levels via use of oxygen therapy and CPAP use,:conserving activity, medication list and administration times, follow up appointments, when to call the MD, etc.  Comprehension of education ascertained via "teach-back" method.  Sister contacted re:  Bringing portable oxygen and a change of clothes for her.

## 2018-10-19 NOTE — Progress Notes (Signed)
Inpatient Diabetes Program Recommendations  AACE/ADA: New Consensus Statement on Inpatient Glycemic Control (2015)  Target Ranges:  Prepandial:   less than 140 mg/dL      Peak postprandial:   less than 180 mg/dL (1-2 hours)      Critically ill patients:  140 - 180 mg/dL   Results for TEKEYA, GEFFERT (MRN [id number]) as of 10/19/2018 09:58  Ref. Range 10/18/2018 05:01 10/18/2018 12:21 10/18/2018 13:14 10/18/2018 16:45 10/18/2018 17:05 10/18/2018 17:26 10/18/2018 20:37 10/19/2018 08:06  Glucose-Capillary Latest Ref Range: 70 - 99 mg/dL 262 (H) 67 (L) 83 54 (L) 69 (L) 100 (H) 99 115 (H)   Review of Glycemic Control  Diabetes history: DM 2 Outpatient Diabetes medications: Januvia 50 mg Daily Current orders for Inpatient glycemic control: Novolog 0-20 units tid, Novolog 0-5 qhs  Inpatient Diabetes Program Recommendations:     Patient with hypoglycemia, no insulin given prior. Patient's renal function elevated.   Consider decreasing Novolog correction scale to 0-9 units tid.   Also consider Carb modified heart healthy diet  Thanks,  Tama Headings RN, MSN, BC-ADM Inpatient Diabetes Coordinator Team Pager [phone] (8a-5p)

## 2018-10-20 ENCOUNTER — Encounter: Payer: Self-pay | Admitting: Internal Medicine

## 2018-10-24 DIAGNOSIS — Z9981 Dependence on supplemental oxygen: Secondary | ICD-10-CM | POA: Diagnosis not present

## 2018-10-24 DIAGNOSIS — R569 Unspecified convulsions: Secondary | ICD-10-CM | POA: Diagnosis not present

## 2018-10-24 DIAGNOSIS — N183 Chronic kidney disease, stage 3 (moderate): Secondary | ICD-10-CM | POA: Diagnosis not present

## 2018-10-24 DIAGNOSIS — E1122 Type 2 diabetes mellitus with diabetic chronic kidney disease: Secondary | ICD-10-CM | POA: Diagnosis not present

## 2018-10-24 DIAGNOSIS — Z950 Presence of cardiac pacemaker: Secondary | ICD-10-CM | POA: Diagnosis not present

## 2018-10-24 DIAGNOSIS — Z7982 Long term (current) use of aspirin: Secondary | ICD-10-CM | POA: Diagnosis not present

## 2018-10-24 DIAGNOSIS — Z7984 Long term (current) use of oral hypoglycemic drugs: Secondary | ICD-10-CM | POA: Diagnosis not present

## 2018-10-24 DIAGNOSIS — I129 Hypertensive chronic kidney disease with stage 1 through stage 4 chronic kidney disease, or unspecified chronic kidney disease: Secondary | ICD-10-CM | POA: Diagnosis not present

## 2018-10-24 DIAGNOSIS — J449 Chronic obstructive pulmonary disease, unspecified: Secondary | ICD-10-CM | POA: Diagnosis not present

## 2018-10-25 DIAGNOSIS — N183 Chronic kidney disease, stage 3 (moderate): Secondary | ICD-10-CM | POA: Diagnosis not present

## 2018-10-25 DIAGNOSIS — Z7984 Long term (current) use of oral hypoglycemic drugs: Secondary | ICD-10-CM | POA: Diagnosis not present

## 2018-10-25 DIAGNOSIS — Z7982 Long term (current) use of aspirin: Secondary | ICD-10-CM | POA: Diagnosis not present

## 2018-10-25 DIAGNOSIS — I129 Hypertensive chronic kidney disease with stage 1 through stage 4 chronic kidney disease, or unspecified chronic kidney disease: Secondary | ICD-10-CM | POA: Diagnosis not present

## 2018-10-25 DIAGNOSIS — E1122 Type 2 diabetes mellitus with diabetic chronic kidney disease: Secondary | ICD-10-CM | POA: Diagnosis not present

## 2018-10-25 DIAGNOSIS — Z9981 Dependence on supplemental oxygen: Secondary | ICD-10-CM | POA: Diagnosis not present

## 2018-10-25 DIAGNOSIS — R569 Unspecified convulsions: Secondary | ICD-10-CM | POA: Diagnosis not present

## 2018-10-25 DIAGNOSIS — Z950 Presence of cardiac pacemaker: Secondary | ICD-10-CM | POA: Diagnosis not present

## 2018-10-25 DIAGNOSIS — J449 Chronic obstructive pulmonary disease, unspecified: Secondary | ICD-10-CM | POA: Diagnosis not present

## 2018-10-26 DIAGNOSIS — Z950 Presence of cardiac pacemaker: Secondary | ICD-10-CM | POA: Diagnosis not present

## 2018-10-26 DIAGNOSIS — Z9981 Dependence on supplemental oxygen: Secondary | ICD-10-CM | POA: Diagnosis not present

## 2018-10-26 DIAGNOSIS — R569 Unspecified convulsions: Secondary | ICD-10-CM | POA: Diagnosis not present

## 2018-10-26 DIAGNOSIS — E1122 Type 2 diabetes mellitus with diabetic chronic kidney disease: Secondary | ICD-10-CM | POA: Diagnosis not present

## 2018-10-26 DIAGNOSIS — Z7984 Long term (current) use of oral hypoglycemic drugs: Secondary | ICD-10-CM | POA: Diagnosis not present

## 2018-10-26 DIAGNOSIS — I129 Hypertensive chronic kidney disease with stage 1 through stage 4 chronic kidney disease, or unspecified chronic kidney disease: Secondary | ICD-10-CM | POA: Diagnosis not present

## 2018-10-26 DIAGNOSIS — J449 Chronic obstructive pulmonary disease, unspecified: Secondary | ICD-10-CM | POA: Diagnosis not present

## 2018-10-26 DIAGNOSIS — N183 Chronic kidney disease, stage 3 (moderate): Secondary | ICD-10-CM | POA: Diagnosis not present

## 2018-10-26 DIAGNOSIS — Z7982 Long term (current) use of aspirin: Secondary | ICD-10-CM | POA: Diagnosis not present

## 2018-10-27 DIAGNOSIS — N183 Chronic kidney disease, stage 3 (moderate): Secondary | ICD-10-CM | POA: Diagnosis not present

## 2018-10-27 DIAGNOSIS — G4733 Obstructive sleep apnea (adult) (pediatric): Secondary | ICD-10-CM | POA: Diagnosis not present

## 2018-10-27 DIAGNOSIS — N179 Acute kidney failure, unspecified: Secondary | ICD-10-CM | POA: Diagnosis not present

## 2018-10-27 DIAGNOSIS — J9612 Chronic respiratory failure with hypercapnia: Secondary | ICD-10-CM | POA: Diagnosis not present

## 2018-10-27 DIAGNOSIS — R569 Unspecified convulsions: Secondary | ICD-10-CM | POA: Diagnosis not present

## 2018-10-27 DIAGNOSIS — J9611 Chronic respiratory failure with hypoxia: Secondary | ICD-10-CM | POA: Diagnosis not present

## 2018-10-27 DIAGNOSIS — G9340 Encephalopathy, unspecified: Secondary | ICD-10-CM | POA: Diagnosis not present

## 2018-10-28 DIAGNOSIS — R569 Unspecified convulsions: Secondary | ICD-10-CM | POA: Diagnosis not present

## 2018-10-28 DIAGNOSIS — N183 Chronic kidney disease, stage 3 (moderate): Secondary | ICD-10-CM | POA: Diagnosis not present

## 2018-10-28 DIAGNOSIS — N179 Acute kidney failure, unspecified: Secondary | ICD-10-CM | POA: Diagnosis not present

## 2018-10-29 DIAGNOSIS — J9611 Chronic respiratory failure with hypoxia: Secondary | ICD-10-CM | POA: Diagnosis not present

## 2018-10-29 DIAGNOSIS — I129 Hypertensive chronic kidney disease with stage 1 through stage 4 chronic kidney disease, or unspecified chronic kidney disease: Secondary | ICD-10-CM | POA: Diagnosis not present

## 2018-10-29 DIAGNOSIS — Z9981 Dependence on supplemental oxygen: Secondary | ICD-10-CM | POA: Diagnosis not present

## 2018-10-29 DIAGNOSIS — R569 Unspecified convulsions: Secondary | ICD-10-CM | POA: Diagnosis not present

## 2018-10-29 DIAGNOSIS — Z7982 Long term (current) use of aspirin: Secondary | ICD-10-CM | POA: Diagnosis not present

## 2018-10-29 DIAGNOSIS — N183 Chronic kidney disease, stage 3 (moderate): Secondary | ICD-10-CM | POA: Diagnosis not present

## 2018-10-29 DIAGNOSIS — E1122 Type 2 diabetes mellitus with diabetic chronic kidney disease: Secondary | ICD-10-CM | POA: Diagnosis not present

## 2018-10-29 DIAGNOSIS — J449 Chronic obstructive pulmonary disease, unspecified: Secondary | ICD-10-CM | POA: Diagnosis not present

## 2018-10-29 DIAGNOSIS — Z950 Presence of cardiac pacemaker: Secondary | ICD-10-CM | POA: Diagnosis not present

## 2018-10-29 DIAGNOSIS — Z7984 Long term (current) use of oral hypoglycemic drugs: Secondary | ICD-10-CM | POA: Diagnosis not present

## 2018-10-30 DIAGNOSIS — G4733 Obstructive sleep apnea (adult) (pediatric): Secondary | ICD-10-CM | POA: Diagnosis not present

## 2018-11-01 DIAGNOSIS — I129 Hypertensive chronic kidney disease with stage 1 through stage 4 chronic kidney disease, or unspecified chronic kidney disease: Secondary | ICD-10-CM | POA: Diagnosis not present

## 2018-11-01 DIAGNOSIS — N183 Chronic kidney disease, stage 3 (moderate): Secondary | ICD-10-CM | POA: Diagnosis not present

## 2018-11-01 DIAGNOSIS — Z950 Presence of cardiac pacemaker: Secondary | ICD-10-CM | POA: Diagnosis not present

## 2018-11-01 DIAGNOSIS — Z7984 Long term (current) use of oral hypoglycemic drugs: Secondary | ICD-10-CM | POA: Diagnosis not present

## 2018-11-01 DIAGNOSIS — R569 Unspecified convulsions: Secondary | ICD-10-CM | POA: Diagnosis not present

## 2018-11-01 DIAGNOSIS — J449 Chronic obstructive pulmonary disease, unspecified: Secondary | ICD-10-CM | POA: Diagnosis not present

## 2018-11-01 DIAGNOSIS — Z9981 Dependence on supplemental oxygen: Secondary | ICD-10-CM | POA: Diagnosis not present

## 2018-11-01 DIAGNOSIS — E1122 Type 2 diabetes mellitus with diabetic chronic kidney disease: Secondary | ICD-10-CM | POA: Diagnosis not present

## 2018-11-01 DIAGNOSIS — Z7982 Long term (current) use of aspirin: Secondary | ICD-10-CM | POA: Diagnosis not present

## 2018-11-02 DIAGNOSIS — J9611 Chronic respiratory failure with hypoxia: Secondary | ICD-10-CM | POA: Diagnosis not present

## 2018-11-02 DIAGNOSIS — N183 Chronic kidney disease, stage 3 (moderate): Secondary | ICD-10-CM | POA: Diagnosis not present

## 2018-11-02 DIAGNOSIS — G4733 Obstructive sleep apnea (adult) (pediatric): Secondary | ICD-10-CM | POA: Diagnosis not present

## 2018-11-02 DIAGNOSIS — J9612 Chronic respiratory failure with hypercapnia: Secondary | ICD-10-CM | POA: Diagnosis not present

## 2018-11-03 DIAGNOSIS — Z7982 Long term (current) use of aspirin: Secondary | ICD-10-CM | POA: Diagnosis not present

## 2018-11-03 DIAGNOSIS — Z950 Presence of cardiac pacemaker: Secondary | ICD-10-CM | POA: Diagnosis not present

## 2018-11-03 DIAGNOSIS — I129 Hypertensive chronic kidney disease with stage 1 through stage 4 chronic kidney disease, or unspecified chronic kidney disease: Secondary | ICD-10-CM | POA: Diagnosis not present

## 2018-11-03 DIAGNOSIS — Z7984 Long term (current) use of oral hypoglycemic drugs: Secondary | ICD-10-CM | POA: Diagnosis not present

## 2018-11-03 DIAGNOSIS — E1122 Type 2 diabetes mellitus with diabetic chronic kidney disease: Secondary | ICD-10-CM | POA: Diagnosis not present

## 2018-11-03 DIAGNOSIS — Z9981 Dependence on supplemental oxygen: Secondary | ICD-10-CM | POA: Diagnosis not present

## 2018-11-03 DIAGNOSIS — J449 Chronic obstructive pulmonary disease, unspecified: Secondary | ICD-10-CM | POA: Diagnosis not present

## 2018-11-03 DIAGNOSIS — R569 Unspecified convulsions: Secondary | ICD-10-CM | POA: Diagnosis not present

## 2018-11-03 DIAGNOSIS — N183 Chronic kidney disease, stage 3 (moderate): Secondary | ICD-10-CM | POA: Diagnosis not present

## 2018-11-04 DIAGNOSIS — N183 Chronic kidney disease, stage 3 (moderate): Secondary | ICD-10-CM | POA: Diagnosis not present

## 2018-11-04 DIAGNOSIS — G4733 Obstructive sleep apnea (adult) (pediatric): Secondary | ICD-10-CM | POA: Diagnosis not present

## 2018-11-04 DIAGNOSIS — R252 Cramp and spasm: Secondary | ICD-10-CM | POA: Diagnosis not present

## 2018-11-04 DIAGNOSIS — Z950 Presence of cardiac pacemaker: Secondary | ICD-10-CM | POA: Diagnosis not present

## 2018-11-04 DIAGNOSIS — M5442 Lumbago with sciatica, left side: Secondary | ICD-10-CM | POA: Diagnosis not present

## 2018-11-04 DIAGNOSIS — Z7982 Long term (current) use of aspirin: Secondary | ICD-10-CM | POA: Diagnosis not present

## 2018-11-04 DIAGNOSIS — I129 Hypertensive chronic kidney disease with stage 1 through stage 4 chronic kidney disease, or unspecified chronic kidney disease: Secondary | ICD-10-CM | POA: Diagnosis not present

## 2018-11-04 DIAGNOSIS — E1122 Type 2 diabetes mellitus with diabetic chronic kidney disease: Secondary | ICD-10-CM | POA: Diagnosis not present

## 2018-11-04 DIAGNOSIS — J449 Chronic obstructive pulmonary disease, unspecified: Secondary | ICD-10-CM | POA: Diagnosis not present

## 2018-11-04 DIAGNOSIS — Z9981 Dependence on supplemental oxygen: Secondary | ICD-10-CM | POA: Diagnosis not present

## 2018-11-04 DIAGNOSIS — R569 Unspecified convulsions: Secondary | ICD-10-CM | POA: Diagnosis not present

## 2018-11-04 DIAGNOSIS — Z7984 Long term (current) use of oral hypoglycemic drugs: Secondary | ICD-10-CM | POA: Diagnosis not present

## 2018-11-09 DIAGNOSIS — Z7982 Long term (current) use of aspirin: Secondary | ICD-10-CM | POA: Diagnosis not present

## 2018-11-09 DIAGNOSIS — I129 Hypertensive chronic kidney disease with stage 1 through stage 4 chronic kidney disease, or unspecified chronic kidney disease: Secondary | ICD-10-CM | POA: Diagnosis not present

## 2018-11-09 DIAGNOSIS — Z9981 Dependence on supplemental oxygen: Secondary | ICD-10-CM | POA: Diagnosis not present

## 2018-11-09 DIAGNOSIS — R569 Unspecified convulsions: Secondary | ICD-10-CM | POA: Diagnosis not present

## 2018-11-09 DIAGNOSIS — Z950 Presence of cardiac pacemaker: Secondary | ICD-10-CM | POA: Diagnosis not present

## 2018-11-09 DIAGNOSIS — J449 Chronic obstructive pulmonary disease, unspecified: Secondary | ICD-10-CM | POA: Diagnosis not present

## 2018-11-09 DIAGNOSIS — Z7984 Long term (current) use of oral hypoglycemic drugs: Secondary | ICD-10-CM | POA: Diagnosis not present

## 2018-11-09 DIAGNOSIS — E1122 Type 2 diabetes mellitus with diabetic chronic kidney disease: Secondary | ICD-10-CM | POA: Diagnosis not present

## 2018-11-09 DIAGNOSIS — N183 Chronic kidney disease, stage 3 (moderate): Secondary | ICD-10-CM | POA: Diagnosis not present

## 2018-11-10 DIAGNOSIS — Z7982 Long term (current) use of aspirin: Secondary | ICD-10-CM | POA: Diagnosis not present

## 2018-11-10 DIAGNOSIS — Z7984 Long term (current) use of oral hypoglycemic drugs: Secondary | ICD-10-CM | POA: Diagnosis not present

## 2018-11-10 DIAGNOSIS — I129 Hypertensive chronic kidney disease with stage 1 through stage 4 chronic kidney disease, or unspecified chronic kidney disease: Secondary | ICD-10-CM | POA: Diagnosis not present

## 2018-11-10 DIAGNOSIS — N183 Chronic kidney disease, stage 3 (moderate): Secondary | ICD-10-CM | POA: Diagnosis not present

## 2018-11-10 DIAGNOSIS — Z950 Presence of cardiac pacemaker: Secondary | ICD-10-CM | POA: Diagnosis not present

## 2018-11-10 DIAGNOSIS — Z9981 Dependence on supplemental oxygen: Secondary | ICD-10-CM | POA: Diagnosis not present

## 2018-11-10 DIAGNOSIS — E1122 Type 2 diabetes mellitus with diabetic chronic kidney disease: Secondary | ICD-10-CM | POA: Diagnosis not present

## 2018-11-10 DIAGNOSIS — R569 Unspecified convulsions: Secondary | ICD-10-CM | POA: Diagnosis not present

## 2018-11-10 DIAGNOSIS — J449 Chronic obstructive pulmonary disease, unspecified: Secondary | ICD-10-CM | POA: Diagnosis not present

## 2018-11-11 DIAGNOSIS — E1122 Type 2 diabetes mellitus with diabetic chronic kidney disease: Secondary | ICD-10-CM | POA: Diagnosis not present

## 2018-11-11 DIAGNOSIS — I129 Hypertensive chronic kidney disease with stage 1 through stage 4 chronic kidney disease, or unspecified chronic kidney disease: Secondary | ICD-10-CM | POA: Diagnosis not present

## 2018-11-11 DIAGNOSIS — Z7984 Long term (current) use of oral hypoglycemic drugs: Secondary | ICD-10-CM | POA: Diagnosis not present

## 2018-11-11 DIAGNOSIS — N183 Chronic kidney disease, stage 3 (moderate): Secondary | ICD-10-CM | POA: Diagnosis not present

## 2018-11-11 DIAGNOSIS — R569 Unspecified convulsions: Secondary | ICD-10-CM | POA: Diagnosis not present

## 2018-11-11 DIAGNOSIS — Z7982 Long term (current) use of aspirin: Secondary | ICD-10-CM | POA: Diagnosis not present

## 2018-11-11 DIAGNOSIS — Z950 Presence of cardiac pacemaker: Secondary | ICD-10-CM | POA: Diagnosis not present

## 2018-11-11 DIAGNOSIS — J449 Chronic obstructive pulmonary disease, unspecified: Secondary | ICD-10-CM | POA: Diagnosis not present

## 2018-11-11 DIAGNOSIS — Z9981 Dependence on supplemental oxygen: Secondary | ICD-10-CM | POA: Diagnosis not present

## 2018-11-12 DIAGNOSIS — E1122 Type 2 diabetes mellitus with diabetic chronic kidney disease: Secondary | ICD-10-CM | POA: Diagnosis not present

## 2018-11-12 DIAGNOSIS — Z7984 Long term (current) use of oral hypoglycemic drugs: Secondary | ICD-10-CM | POA: Diagnosis not present

## 2018-11-12 DIAGNOSIS — R569 Unspecified convulsions: Secondary | ICD-10-CM | POA: Diagnosis not present

## 2018-11-12 DIAGNOSIS — Z7982 Long term (current) use of aspirin: Secondary | ICD-10-CM | POA: Diagnosis not present

## 2018-11-12 DIAGNOSIS — Z9981 Dependence on supplemental oxygen: Secondary | ICD-10-CM | POA: Diagnosis not present

## 2018-11-12 DIAGNOSIS — Z950 Presence of cardiac pacemaker: Secondary | ICD-10-CM | POA: Diagnosis not present

## 2018-11-12 DIAGNOSIS — N183 Chronic kidney disease, stage 3 (moderate): Secondary | ICD-10-CM | POA: Diagnosis not present

## 2018-11-12 DIAGNOSIS — J449 Chronic obstructive pulmonary disease, unspecified: Secondary | ICD-10-CM | POA: Diagnosis not present

## 2018-11-12 DIAGNOSIS — I129 Hypertensive chronic kidney disease with stage 1 through stage 4 chronic kidney disease, or unspecified chronic kidney disease: Secondary | ICD-10-CM | POA: Diagnosis not present

## 2018-11-15 DIAGNOSIS — Z7982 Long term (current) use of aspirin: Secondary | ICD-10-CM | POA: Diagnosis not present

## 2018-11-15 DIAGNOSIS — E1122 Type 2 diabetes mellitus with diabetic chronic kidney disease: Secondary | ICD-10-CM | POA: Diagnosis not present

## 2018-11-15 DIAGNOSIS — R569 Unspecified convulsions: Secondary | ICD-10-CM | POA: Diagnosis not present

## 2018-11-15 DIAGNOSIS — Z9981 Dependence on supplemental oxygen: Secondary | ICD-10-CM | POA: Diagnosis not present

## 2018-11-15 DIAGNOSIS — N183 Chronic kidney disease, stage 3 (moderate): Secondary | ICD-10-CM | POA: Diagnosis not present

## 2018-11-15 DIAGNOSIS — N189 Chronic kidney disease, unspecified: Secondary | ICD-10-CM | POA: Diagnosis not present

## 2018-11-15 DIAGNOSIS — R600 Localized edema: Secondary | ICD-10-CM | POA: Diagnosis not present

## 2018-11-15 DIAGNOSIS — Z950 Presence of cardiac pacemaker: Secondary | ICD-10-CM | POA: Diagnosis not present

## 2018-11-15 DIAGNOSIS — Z7984 Long term (current) use of oral hypoglycemic drugs: Secondary | ICD-10-CM | POA: Diagnosis not present

## 2018-11-15 DIAGNOSIS — I129 Hypertensive chronic kidney disease with stage 1 through stage 4 chronic kidney disease, or unspecified chronic kidney disease: Secondary | ICD-10-CM | POA: Diagnosis not present

## 2018-11-15 DIAGNOSIS — J449 Chronic obstructive pulmonary disease, unspecified: Secondary | ICD-10-CM | POA: Diagnosis not present

## 2018-11-16 DIAGNOSIS — J449 Chronic obstructive pulmonary disease, unspecified: Secondary | ICD-10-CM | POA: Diagnosis not present

## 2018-11-16 DIAGNOSIS — R569 Unspecified convulsions: Secondary | ICD-10-CM | POA: Diagnosis not present

## 2018-11-16 DIAGNOSIS — Z7984 Long term (current) use of oral hypoglycemic drugs: Secondary | ICD-10-CM | POA: Diagnosis not present

## 2018-11-16 DIAGNOSIS — Z950 Presence of cardiac pacemaker: Secondary | ICD-10-CM | POA: Diagnosis not present

## 2018-11-16 DIAGNOSIS — Z9981 Dependence on supplemental oxygen: Secondary | ICD-10-CM | POA: Diagnosis not present

## 2018-11-16 DIAGNOSIS — I129 Hypertensive chronic kidney disease with stage 1 through stage 4 chronic kidney disease, or unspecified chronic kidney disease: Secondary | ICD-10-CM | POA: Diagnosis not present

## 2018-11-16 DIAGNOSIS — E1122 Type 2 diabetes mellitus with diabetic chronic kidney disease: Secondary | ICD-10-CM | POA: Diagnosis not present

## 2018-11-16 DIAGNOSIS — Z7982 Long term (current) use of aspirin: Secondary | ICD-10-CM | POA: Diagnosis not present

## 2018-11-16 DIAGNOSIS — N183 Chronic kidney disease, stage 3 (moderate): Secondary | ICD-10-CM | POA: Diagnosis not present

## 2018-11-18 DIAGNOSIS — J449 Chronic obstructive pulmonary disease, unspecified: Secondary | ICD-10-CM | POA: Diagnosis not present

## 2018-11-18 DIAGNOSIS — R569 Unspecified convulsions: Secondary | ICD-10-CM | POA: Diagnosis not present

## 2018-11-18 DIAGNOSIS — Z9981 Dependence on supplemental oxygen: Secondary | ICD-10-CM | POA: Diagnosis not present

## 2018-11-18 DIAGNOSIS — Z7984 Long term (current) use of oral hypoglycemic drugs: Secondary | ICD-10-CM | POA: Diagnosis not present

## 2018-11-18 DIAGNOSIS — N183 Chronic kidney disease, stage 3 (moderate): Secondary | ICD-10-CM | POA: Diagnosis not present

## 2018-11-18 DIAGNOSIS — I129 Hypertensive chronic kidney disease with stage 1 through stage 4 chronic kidney disease, or unspecified chronic kidney disease: Secondary | ICD-10-CM | POA: Diagnosis not present

## 2018-11-18 DIAGNOSIS — Z950 Presence of cardiac pacemaker: Secondary | ICD-10-CM | POA: Diagnosis not present

## 2018-11-18 DIAGNOSIS — Z7982 Long term (current) use of aspirin: Secondary | ICD-10-CM | POA: Diagnosis not present

## 2018-11-18 DIAGNOSIS — E1122 Type 2 diabetes mellitus with diabetic chronic kidney disease: Secondary | ICD-10-CM | POA: Diagnosis not present

## 2018-11-19 DIAGNOSIS — E1122 Type 2 diabetes mellitus with diabetic chronic kidney disease: Secondary | ICD-10-CM | POA: Diagnosis not present

## 2018-11-19 DIAGNOSIS — N183 Chronic kidney disease, stage 3 (moderate): Secondary | ICD-10-CM | POA: Diagnosis not present

## 2018-11-19 DIAGNOSIS — Z7984 Long term (current) use of oral hypoglycemic drugs: Secondary | ICD-10-CM | POA: Diagnosis not present

## 2018-11-19 DIAGNOSIS — J449 Chronic obstructive pulmonary disease, unspecified: Secondary | ICD-10-CM | POA: Diagnosis not present

## 2018-11-19 DIAGNOSIS — Z7982 Long term (current) use of aspirin: Secondary | ICD-10-CM | POA: Diagnosis not present

## 2018-11-19 DIAGNOSIS — Z950 Presence of cardiac pacemaker: Secondary | ICD-10-CM | POA: Diagnosis not present

## 2018-11-19 DIAGNOSIS — Z9981 Dependence on supplemental oxygen: Secondary | ICD-10-CM | POA: Diagnosis not present

## 2018-11-19 DIAGNOSIS — R569 Unspecified convulsions: Secondary | ICD-10-CM | POA: Diagnosis not present

## 2018-11-19 DIAGNOSIS — I129 Hypertensive chronic kidney disease with stage 1 through stage 4 chronic kidney disease, or unspecified chronic kidney disease: Secondary | ICD-10-CM | POA: Diagnosis not present

## 2018-11-22 DIAGNOSIS — N183 Chronic kidney disease, stage 3 (moderate): Secondary | ICD-10-CM | POA: Diagnosis not present

## 2018-11-22 DIAGNOSIS — Z7982 Long term (current) use of aspirin: Secondary | ICD-10-CM | POA: Diagnosis not present

## 2018-11-22 DIAGNOSIS — I129 Hypertensive chronic kidney disease with stage 1 through stage 4 chronic kidney disease, or unspecified chronic kidney disease: Secondary | ICD-10-CM | POA: Diagnosis not present

## 2018-11-22 DIAGNOSIS — Z950 Presence of cardiac pacemaker: Secondary | ICD-10-CM | POA: Diagnosis not present

## 2018-11-22 DIAGNOSIS — E1122 Type 2 diabetes mellitus with diabetic chronic kidney disease: Secondary | ICD-10-CM | POA: Diagnosis not present

## 2018-11-22 DIAGNOSIS — J449 Chronic obstructive pulmonary disease, unspecified: Secondary | ICD-10-CM | POA: Diagnosis not present

## 2018-11-22 DIAGNOSIS — Z9981 Dependence on supplemental oxygen: Secondary | ICD-10-CM | POA: Diagnosis not present

## 2018-11-22 DIAGNOSIS — Z7984 Long term (current) use of oral hypoglycemic drugs: Secondary | ICD-10-CM | POA: Diagnosis not present

## 2018-11-22 DIAGNOSIS — R569 Unspecified convulsions: Secondary | ICD-10-CM | POA: Diagnosis not present

## 2018-11-24 DIAGNOSIS — Z9981 Dependence on supplemental oxygen: Secondary | ICD-10-CM | POA: Diagnosis not present

## 2018-11-24 DIAGNOSIS — N183 Chronic kidney disease, stage 3 (moderate): Secondary | ICD-10-CM | POA: Diagnosis not present

## 2018-11-24 DIAGNOSIS — Z7984 Long term (current) use of oral hypoglycemic drugs: Secondary | ICD-10-CM | POA: Diagnosis not present

## 2018-11-24 DIAGNOSIS — E1122 Type 2 diabetes mellitus with diabetic chronic kidney disease: Secondary | ICD-10-CM | POA: Diagnosis not present

## 2018-11-24 DIAGNOSIS — I129 Hypertensive chronic kidney disease with stage 1 through stage 4 chronic kidney disease, or unspecified chronic kidney disease: Secondary | ICD-10-CM | POA: Diagnosis not present

## 2018-11-24 DIAGNOSIS — J449 Chronic obstructive pulmonary disease, unspecified: Secondary | ICD-10-CM | POA: Diagnosis not present

## 2018-11-24 DIAGNOSIS — Z950 Presence of cardiac pacemaker: Secondary | ICD-10-CM | POA: Diagnosis not present

## 2018-11-24 DIAGNOSIS — R569 Unspecified convulsions: Secondary | ICD-10-CM | POA: Diagnosis not present

## 2018-11-24 DIAGNOSIS — Z7982 Long term (current) use of aspirin: Secondary | ICD-10-CM | POA: Diagnosis not present

## 2018-11-29 DIAGNOSIS — R569 Unspecified convulsions: Secondary | ICD-10-CM | POA: Diagnosis not present

## 2018-11-29 DIAGNOSIS — Z7982 Long term (current) use of aspirin: Secondary | ICD-10-CM | POA: Diagnosis not present

## 2018-11-29 DIAGNOSIS — H5203 Hypermetropia, bilateral: Secondary | ICD-10-CM | POA: Diagnosis not present

## 2018-11-29 DIAGNOSIS — J9611 Chronic respiratory failure with hypoxia: Secondary | ICD-10-CM | POA: Diagnosis not present

## 2018-11-29 DIAGNOSIS — Z950 Presence of cardiac pacemaker: Secondary | ICD-10-CM | POA: Diagnosis not present

## 2018-11-29 DIAGNOSIS — E119 Type 2 diabetes mellitus without complications: Secondary | ICD-10-CM | POA: Diagnosis not present

## 2018-11-29 DIAGNOSIS — N183 Chronic kidney disease, stage 3 (moderate): Secondary | ICD-10-CM | POA: Diagnosis not present

## 2018-11-29 DIAGNOSIS — I129 Hypertensive chronic kidney disease with stage 1 through stage 4 chronic kidney disease, or unspecified chronic kidney disease: Secondary | ICD-10-CM | POA: Diagnosis not present

## 2018-11-29 DIAGNOSIS — Z7984 Long term (current) use of oral hypoglycemic drugs: Secondary | ICD-10-CM | POA: Diagnosis not present

## 2018-11-29 DIAGNOSIS — Z9981 Dependence on supplemental oxygen: Secondary | ICD-10-CM | POA: Diagnosis not present

## 2018-11-29 DIAGNOSIS — E1122 Type 2 diabetes mellitus with diabetic chronic kidney disease: Secondary | ICD-10-CM | POA: Diagnosis not present

## 2018-11-29 DIAGNOSIS — J449 Chronic obstructive pulmonary disease, unspecified: Secondary | ICD-10-CM | POA: Diagnosis not present

## 2018-11-30 DIAGNOSIS — E1122 Type 2 diabetes mellitus with diabetic chronic kidney disease: Secondary | ICD-10-CM | POA: Diagnosis not present

## 2018-11-30 DIAGNOSIS — Z7984 Long term (current) use of oral hypoglycemic drugs: Secondary | ICD-10-CM | POA: Diagnosis not present

## 2018-11-30 DIAGNOSIS — Z9981 Dependence on supplemental oxygen: Secondary | ICD-10-CM | POA: Diagnosis not present

## 2018-11-30 DIAGNOSIS — R569 Unspecified convulsions: Secondary | ICD-10-CM | POA: Diagnosis not present

## 2018-11-30 DIAGNOSIS — J449 Chronic obstructive pulmonary disease, unspecified: Secondary | ICD-10-CM | POA: Diagnosis not present

## 2018-11-30 DIAGNOSIS — Z950 Presence of cardiac pacemaker: Secondary | ICD-10-CM | POA: Diagnosis not present

## 2018-11-30 DIAGNOSIS — Z7982 Long term (current) use of aspirin: Secondary | ICD-10-CM | POA: Diagnosis not present

## 2018-11-30 DIAGNOSIS — N183 Chronic kidney disease, stage 3 (moderate): Secondary | ICD-10-CM | POA: Diagnosis not present

## 2018-11-30 DIAGNOSIS — I129 Hypertensive chronic kidney disease with stage 1 through stage 4 chronic kidney disease, or unspecified chronic kidney disease: Secondary | ICD-10-CM | POA: Diagnosis not present

## 2018-12-05 DIAGNOSIS — M5442 Lumbago with sciatica, left side: Secondary | ICD-10-CM | POA: Diagnosis not present

## 2018-12-05 DIAGNOSIS — R252 Cramp and spasm: Secondary | ICD-10-CM | POA: Diagnosis not present

## 2018-12-05 DIAGNOSIS — G4733 Obstructive sleep apnea (adult) (pediatric): Secondary | ICD-10-CM | POA: Diagnosis not present

## 2018-12-07 DIAGNOSIS — J449 Chronic obstructive pulmonary disease, unspecified: Secondary | ICD-10-CM | POA: Diagnosis not present

## 2018-12-07 DIAGNOSIS — E1122 Type 2 diabetes mellitus with diabetic chronic kidney disease: Secondary | ICD-10-CM | POA: Diagnosis not present

## 2018-12-07 DIAGNOSIS — I129 Hypertensive chronic kidney disease with stage 1 through stage 4 chronic kidney disease, or unspecified chronic kidney disease: Secondary | ICD-10-CM | POA: Diagnosis not present

## 2018-12-07 DIAGNOSIS — Z950 Presence of cardiac pacemaker: Secondary | ICD-10-CM | POA: Diagnosis not present

## 2018-12-07 DIAGNOSIS — Z7984 Long term (current) use of oral hypoglycemic drugs: Secondary | ICD-10-CM | POA: Diagnosis not present

## 2018-12-07 DIAGNOSIS — R569 Unspecified convulsions: Secondary | ICD-10-CM | POA: Diagnosis not present

## 2018-12-07 DIAGNOSIS — Z7982 Long term (current) use of aspirin: Secondary | ICD-10-CM | POA: Diagnosis not present

## 2018-12-07 DIAGNOSIS — Z9981 Dependence on supplemental oxygen: Secondary | ICD-10-CM | POA: Diagnosis not present

## 2018-12-07 DIAGNOSIS — N183 Chronic kidney disease, stage 3 (moderate): Secondary | ICD-10-CM | POA: Diagnosis not present

## 2018-12-08 DIAGNOSIS — R569 Unspecified convulsions: Secondary | ICD-10-CM | POA: Diagnosis not present

## 2018-12-08 DIAGNOSIS — Z9981 Dependence on supplemental oxygen: Secondary | ICD-10-CM | POA: Diagnosis not present

## 2018-12-08 DIAGNOSIS — I129 Hypertensive chronic kidney disease with stage 1 through stage 4 chronic kidney disease, or unspecified chronic kidney disease: Secondary | ICD-10-CM | POA: Diagnosis not present

## 2018-12-08 DIAGNOSIS — E1122 Type 2 diabetes mellitus with diabetic chronic kidney disease: Secondary | ICD-10-CM | POA: Diagnosis not present

## 2018-12-08 DIAGNOSIS — Z950 Presence of cardiac pacemaker: Secondary | ICD-10-CM | POA: Diagnosis not present

## 2018-12-08 DIAGNOSIS — Z7984 Long term (current) use of oral hypoglycemic drugs: Secondary | ICD-10-CM | POA: Diagnosis not present

## 2018-12-08 DIAGNOSIS — J449 Chronic obstructive pulmonary disease, unspecified: Secondary | ICD-10-CM | POA: Diagnosis not present

## 2018-12-08 DIAGNOSIS — N183 Chronic kidney disease, stage 3 (moderate): Secondary | ICD-10-CM | POA: Diagnosis not present

## 2018-12-08 DIAGNOSIS — Z7982 Long term (current) use of aspirin: Secondary | ICD-10-CM | POA: Diagnosis not present

## 2018-12-09 DIAGNOSIS — Z7984 Long term (current) use of oral hypoglycemic drugs: Secondary | ICD-10-CM | POA: Diagnosis not present

## 2018-12-09 DIAGNOSIS — I129 Hypertensive chronic kidney disease with stage 1 through stage 4 chronic kidney disease, or unspecified chronic kidney disease: Secondary | ICD-10-CM | POA: Diagnosis not present

## 2018-12-09 DIAGNOSIS — E1122 Type 2 diabetes mellitus with diabetic chronic kidney disease: Secondary | ICD-10-CM | POA: Diagnosis not present

## 2018-12-09 DIAGNOSIS — R569 Unspecified convulsions: Secondary | ICD-10-CM | POA: Diagnosis not present

## 2018-12-09 DIAGNOSIS — N183 Chronic kidney disease, stage 3 (moderate): Secondary | ICD-10-CM | POA: Diagnosis not present

## 2018-12-09 DIAGNOSIS — Z950 Presence of cardiac pacemaker: Secondary | ICD-10-CM | POA: Diagnosis not present

## 2018-12-09 DIAGNOSIS — Z9981 Dependence on supplemental oxygen: Secondary | ICD-10-CM | POA: Diagnosis not present

## 2018-12-09 DIAGNOSIS — Z7982 Long term (current) use of aspirin: Secondary | ICD-10-CM | POA: Diagnosis not present

## 2018-12-09 DIAGNOSIS — J449 Chronic obstructive pulmonary disease, unspecified: Secondary | ICD-10-CM | POA: Diagnosis not present

## 2018-12-16 DIAGNOSIS — I129 Hypertensive chronic kidney disease with stage 1 through stage 4 chronic kidney disease, or unspecified chronic kidney disease: Secondary | ICD-10-CM | POA: Diagnosis not present

## 2018-12-16 DIAGNOSIS — J449 Chronic obstructive pulmonary disease, unspecified: Secondary | ICD-10-CM | POA: Diagnosis not present

## 2018-12-16 DIAGNOSIS — Z9981 Dependence on supplemental oxygen: Secondary | ICD-10-CM | POA: Diagnosis not present

## 2018-12-16 DIAGNOSIS — Z7982 Long term (current) use of aspirin: Secondary | ICD-10-CM | POA: Diagnosis not present

## 2018-12-16 DIAGNOSIS — R569 Unspecified convulsions: Secondary | ICD-10-CM | POA: Diagnosis not present

## 2018-12-16 DIAGNOSIS — Z7984 Long term (current) use of oral hypoglycemic drugs: Secondary | ICD-10-CM | POA: Diagnosis not present

## 2018-12-16 DIAGNOSIS — Z950 Presence of cardiac pacemaker: Secondary | ICD-10-CM | POA: Diagnosis not present

## 2018-12-16 DIAGNOSIS — N183 Chronic kidney disease, stage 3 (moderate): Secondary | ICD-10-CM | POA: Diagnosis not present

## 2018-12-16 DIAGNOSIS — E1122 Type 2 diabetes mellitus with diabetic chronic kidney disease: Secondary | ICD-10-CM | POA: Diagnosis not present

## 2018-12-20 DIAGNOSIS — Z9981 Dependence on supplemental oxygen: Secondary | ICD-10-CM | POA: Diagnosis not present

## 2018-12-20 DIAGNOSIS — Z7982 Long term (current) use of aspirin: Secondary | ICD-10-CM | POA: Diagnosis not present

## 2018-12-20 DIAGNOSIS — N183 Chronic kidney disease, stage 3 (moderate): Secondary | ICD-10-CM | POA: Diagnosis not present

## 2018-12-20 DIAGNOSIS — E1122 Type 2 diabetes mellitus with diabetic chronic kidney disease: Secondary | ICD-10-CM | POA: Diagnosis not present

## 2018-12-20 DIAGNOSIS — Z7984 Long term (current) use of oral hypoglycemic drugs: Secondary | ICD-10-CM | POA: Diagnosis not present

## 2018-12-20 DIAGNOSIS — R569 Unspecified convulsions: Secondary | ICD-10-CM | POA: Diagnosis not present

## 2018-12-20 DIAGNOSIS — J449 Chronic obstructive pulmonary disease, unspecified: Secondary | ICD-10-CM | POA: Diagnosis not present

## 2018-12-20 DIAGNOSIS — Z950 Presence of cardiac pacemaker: Secondary | ICD-10-CM | POA: Diagnosis not present

## 2018-12-20 DIAGNOSIS — I129 Hypertensive chronic kidney disease with stage 1 through stage 4 chronic kidney disease, or unspecified chronic kidney disease: Secondary | ICD-10-CM | POA: Diagnosis not present

## 2018-12-21 DIAGNOSIS — I129 Hypertensive chronic kidney disease with stage 1 through stage 4 chronic kidney disease, or unspecified chronic kidney disease: Secondary | ICD-10-CM | POA: Diagnosis not present

## 2018-12-21 DIAGNOSIS — N183 Chronic kidney disease, stage 3 (moderate): Secondary | ICD-10-CM | POA: Diagnosis not present

## 2018-12-21 DIAGNOSIS — Z7982 Long term (current) use of aspirin: Secondary | ICD-10-CM | POA: Diagnosis not present

## 2018-12-21 DIAGNOSIS — E1122 Type 2 diabetes mellitus with diabetic chronic kidney disease: Secondary | ICD-10-CM | POA: Diagnosis not present

## 2018-12-21 DIAGNOSIS — Z9981 Dependence on supplemental oxygen: Secondary | ICD-10-CM | POA: Diagnosis not present

## 2018-12-21 DIAGNOSIS — Z950 Presence of cardiac pacemaker: Secondary | ICD-10-CM | POA: Diagnosis not present

## 2018-12-21 DIAGNOSIS — J449 Chronic obstructive pulmonary disease, unspecified: Secondary | ICD-10-CM | POA: Diagnosis not present

## 2018-12-21 DIAGNOSIS — R569 Unspecified convulsions: Secondary | ICD-10-CM | POA: Diagnosis not present

## 2018-12-21 DIAGNOSIS — Z7984 Long term (current) use of oral hypoglycemic drugs: Secondary | ICD-10-CM | POA: Diagnosis not present

## 2018-12-24 DIAGNOSIS — G4733 Obstructive sleep apnea (adult) (pediatric): Secondary | ICD-10-CM | POA: Diagnosis not present

## 2018-12-29 DIAGNOSIS — J9611 Chronic respiratory failure with hypoxia: Secondary | ICD-10-CM | POA: Diagnosis not present

## 2018-12-31 DIAGNOSIS — M109 Gout, unspecified: Secondary | ICD-10-CM | POA: Diagnosis not present

## 2018-12-31 DIAGNOSIS — E669 Obesity, unspecified: Secondary | ICD-10-CM | POA: Diagnosis not present

## 2018-12-31 DIAGNOSIS — N183 Chronic kidney disease, stage 3 (moderate): Secondary | ICD-10-CM | POA: Diagnosis not present

## 2018-12-31 DIAGNOSIS — E1122 Type 2 diabetes mellitus with diabetic chronic kidney disease: Secondary | ICD-10-CM | POA: Diagnosis not present

## 2018-12-31 DIAGNOSIS — I129 Hypertensive chronic kidney disease with stage 1 through stage 4 chronic kidney disease, or unspecified chronic kidney disease: Secondary | ICD-10-CM | POA: Diagnosis not present

## 2019-01-04 DIAGNOSIS — M5442 Lumbago with sciatica, left side: Secondary | ICD-10-CM | POA: Diagnosis not present

## 2019-01-04 DIAGNOSIS — R252 Cramp and spasm: Secondary | ICD-10-CM | POA: Diagnosis not present

## 2019-01-04 DIAGNOSIS — G4733 Obstructive sleep apnea (adult) (pediatric): Secondary | ICD-10-CM | POA: Diagnosis not present

## 2019-01-24 DIAGNOSIS — N183 Chronic kidney disease, stage 3 (moderate): Secondary | ICD-10-CM | POA: Diagnosis not present

## 2019-01-24 DIAGNOSIS — J9611 Chronic respiratory failure with hypoxia: Secondary | ICD-10-CM | POA: Diagnosis not present

## 2019-01-24 DIAGNOSIS — G8929 Other chronic pain: Secondary | ICD-10-CM | POA: Diagnosis not present

## 2019-01-24 DIAGNOSIS — G4733 Obstructive sleep apnea (adult) (pediatric): Secondary | ICD-10-CM | POA: Diagnosis not present

## 2019-01-24 DIAGNOSIS — Z0000 Encounter for general adult medical examination without abnormal findings: Secondary | ICD-10-CM | POA: Diagnosis not present

## 2019-01-24 DIAGNOSIS — F419 Anxiety disorder, unspecified: Secondary | ICD-10-CM | POA: Diagnosis not present

## 2019-01-24 DIAGNOSIS — R9389 Abnormal findings on diagnostic imaging of other specified body structures: Secondary | ICD-10-CM | POA: Diagnosis not present

## 2019-01-24 DIAGNOSIS — E1122 Type 2 diabetes mellitus with diabetic chronic kidney disease: Secondary | ICD-10-CM | POA: Diagnosis not present

## 2019-01-24 DIAGNOSIS — Z1389 Encounter for screening for other disorder: Secondary | ICD-10-CM | POA: Diagnosis not present

## 2019-01-29 DIAGNOSIS — J9611 Chronic respiratory failure with hypoxia: Secondary | ICD-10-CM | POA: Diagnosis not present

## 2019-02-04 DIAGNOSIS — M5442 Lumbago with sciatica, left side: Secondary | ICD-10-CM | POA: Diagnosis not present

## 2019-02-04 DIAGNOSIS — R252 Cramp and spasm: Secondary | ICD-10-CM | POA: Diagnosis not present

## 2019-02-04 DIAGNOSIS — G4733 Obstructive sleep apnea (adult) (pediatric): Secondary | ICD-10-CM | POA: Diagnosis not present

## 2019-02-07 DIAGNOSIS — J449 Chronic obstructive pulmonary disease, unspecified: Secondary | ICD-10-CM | POA: Diagnosis not present

## 2019-02-07 DIAGNOSIS — E0822 Diabetes mellitus due to underlying condition with diabetic chronic kidney disease: Secondary | ICD-10-CM | POA: Diagnosis not present

## 2019-02-07 DIAGNOSIS — E7800 Pure hypercholesterolemia, unspecified: Secondary | ICD-10-CM | POA: Diagnosis not present

## 2019-02-07 DIAGNOSIS — N189 Chronic kidney disease, unspecified: Secondary | ICD-10-CM | POA: Diagnosis not present

## 2019-02-07 DIAGNOSIS — I10 Essential (primary) hypertension: Secondary | ICD-10-CM | POA: Diagnosis not present

## 2019-02-07 DIAGNOSIS — E1122 Type 2 diabetes mellitus with diabetic chronic kidney disease: Secondary | ICD-10-CM | POA: Diagnosis not present

## 2019-02-07 DIAGNOSIS — D631 Anemia in chronic kidney disease: Secondary | ICD-10-CM | POA: Diagnosis not present

## 2019-02-07 DIAGNOSIS — N183 Chronic kidney disease, stage 3 (moderate): Secondary | ICD-10-CM | POA: Diagnosis not present

## 2019-02-24 DIAGNOSIS — F419 Anxiety disorder, unspecified: Secondary | ICD-10-CM | POA: Diagnosis not present

## 2019-02-24 DIAGNOSIS — J9612 Chronic respiratory failure with hypercapnia: Secondary | ICD-10-CM | POA: Diagnosis not present

## 2019-02-24 DIAGNOSIS — E1122 Type 2 diabetes mellitus with diabetic chronic kidney disease: Secondary | ICD-10-CM | POA: Diagnosis not present

## 2019-02-24 DIAGNOSIS — E7800 Pure hypercholesterolemia, unspecified: Secondary | ICD-10-CM | POA: Diagnosis not present

## 2019-02-24 DIAGNOSIS — I10 Essential (primary) hypertension: Secondary | ICD-10-CM | POA: Diagnosis not present

## 2019-02-24 DIAGNOSIS — G2581 Restless legs syndrome: Secondary | ICD-10-CM | POA: Diagnosis not present

## 2019-02-24 DIAGNOSIS — N183 Chronic kidney disease, stage 3 (moderate): Secondary | ICD-10-CM | POA: Diagnosis not present

## 2019-02-24 DIAGNOSIS — R9389 Abnormal findings on diagnostic imaging of other specified body structures: Secondary | ICD-10-CM | POA: Diagnosis not present

## 2019-02-28 DIAGNOSIS — I10 Essential (primary) hypertension: Secondary | ICD-10-CM | POA: Diagnosis not present

## 2019-02-28 DIAGNOSIS — E7800 Pure hypercholesterolemia, unspecified: Secondary | ICD-10-CM | POA: Diagnosis not present

## 2019-02-28 DIAGNOSIS — E0822 Diabetes mellitus due to underlying condition with diabetic chronic kidney disease: Secondary | ICD-10-CM | POA: Diagnosis not present

## 2019-02-28 DIAGNOSIS — J449 Chronic obstructive pulmonary disease, unspecified: Secondary | ICD-10-CM | POA: Diagnosis not present

## 2019-02-28 DIAGNOSIS — E1122 Type 2 diabetes mellitus with diabetic chronic kidney disease: Secondary | ICD-10-CM | POA: Diagnosis not present

## 2019-02-28 DIAGNOSIS — D631 Anemia in chronic kidney disease: Secondary | ICD-10-CM | POA: Diagnosis not present

## 2019-02-28 DIAGNOSIS — N183 Chronic kidney disease, stage 3 (moderate): Secondary | ICD-10-CM | POA: Diagnosis not present

## 2019-02-28 DIAGNOSIS — N189 Chronic kidney disease, unspecified: Secondary | ICD-10-CM | POA: Diagnosis not present

## 2019-03-01 DIAGNOSIS — J9611 Chronic respiratory failure with hypoxia: Secondary | ICD-10-CM | POA: Diagnosis not present

## 2019-03-07 DIAGNOSIS — M5442 Lumbago with sciatica, left side: Secondary | ICD-10-CM | POA: Diagnosis not present

## 2019-03-07 DIAGNOSIS — H52221 Regular astigmatism, right eye: Secondary | ICD-10-CM | POA: Diagnosis not present

## 2019-03-07 DIAGNOSIS — R252 Cramp and spasm: Secondary | ICD-10-CM | POA: Diagnosis not present

## 2019-03-07 DIAGNOSIS — G4733 Obstructive sleep apnea (adult) (pediatric): Secondary | ICD-10-CM | POA: Diagnosis not present

## 2019-03-07 DIAGNOSIS — H524 Presbyopia: Secondary | ICD-10-CM | POA: Diagnosis not present

## 2019-03-29 DIAGNOSIS — D631 Anemia in chronic kidney disease: Secondary | ICD-10-CM | POA: Diagnosis not present

## 2019-03-29 DIAGNOSIS — E1122 Type 2 diabetes mellitus with diabetic chronic kidney disease: Secondary | ICD-10-CM | POA: Diagnosis not present

## 2019-03-29 DIAGNOSIS — N1831 Chronic kidney disease, stage 3a: Secondary | ICD-10-CM | POA: Diagnosis not present

## 2019-03-29 DIAGNOSIS — I10 Essential (primary) hypertension: Secondary | ICD-10-CM | POA: Diagnosis not present

## 2019-03-29 DIAGNOSIS — E7800 Pure hypercholesterolemia, unspecified: Secondary | ICD-10-CM | POA: Diagnosis not present

## 2019-03-29 DIAGNOSIS — J449 Chronic obstructive pulmonary disease, unspecified: Secondary | ICD-10-CM | POA: Diagnosis not present

## 2019-03-29 DIAGNOSIS — N189 Chronic kidney disease, unspecified: Secondary | ICD-10-CM | POA: Diagnosis not present

## 2019-03-29 DIAGNOSIS — E0822 Diabetes mellitus due to underlying condition with diabetic chronic kidney disease: Secondary | ICD-10-CM | POA: Diagnosis not present

## 2019-03-31 DIAGNOSIS — J9611 Chronic respiratory failure with hypoxia: Secondary | ICD-10-CM | POA: Diagnosis not present

## 2019-04-06 DIAGNOSIS — R252 Cramp and spasm: Secondary | ICD-10-CM | POA: Diagnosis not present

## 2019-04-06 DIAGNOSIS — M5442 Lumbago with sciatica, left side: Secondary | ICD-10-CM | POA: Diagnosis not present

## 2019-04-06 DIAGNOSIS — G4733 Obstructive sleep apnea (adult) (pediatric): Secondary | ICD-10-CM | POA: Diagnosis not present

## 2019-05-01 DIAGNOSIS — J9611 Chronic respiratory failure with hypoxia: Secondary | ICD-10-CM | POA: Diagnosis not present

## 2019-05-07 DIAGNOSIS — M5442 Lumbago with sciatica, left side: Secondary | ICD-10-CM | POA: Diagnosis not present

## 2019-05-07 DIAGNOSIS — G4733 Obstructive sleep apnea (adult) (pediatric): Secondary | ICD-10-CM | POA: Diagnosis not present

## 2019-05-07 DIAGNOSIS — R252 Cramp and spasm: Secondary | ICD-10-CM | POA: Diagnosis not present

## 2019-05-31 DIAGNOSIS — J9611 Chronic respiratory failure with hypoxia: Secondary | ICD-10-CM | POA: Diagnosis not present

## 2019-06-06 DIAGNOSIS — G4733 Obstructive sleep apnea (adult) (pediatric): Secondary | ICD-10-CM | POA: Diagnosis not present

## 2019-06-06 DIAGNOSIS — R252 Cramp and spasm: Secondary | ICD-10-CM | POA: Diagnosis not present

## 2019-06-06 DIAGNOSIS — M5442 Lumbago with sciatica, left side: Secondary | ICD-10-CM | POA: Diagnosis not present

## 2019-07-01 DIAGNOSIS — J9611 Chronic respiratory failure with hypoxia: Secondary | ICD-10-CM | POA: Diagnosis not present

## 2019-07-07 DIAGNOSIS — R252 Cramp and spasm: Secondary | ICD-10-CM | POA: Diagnosis not present

## 2019-07-07 DIAGNOSIS — M5442 Lumbago with sciatica, left side: Secondary | ICD-10-CM | POA: Diagnosis not present

## 2019-07-07 DIAGNOSIS — G4733 Obstructive sleep apnea (adult) (pediatric): Secondary | ICD-10-CM | POA: Diagnosis not present

## 2019-07-11 DIAGNOSIS — J449 Chronic obstructive pulmonary disease, unspecified: Secondary | ICD-10-CM | POA: Diagnosis not present

## 2019-07-11 DIAGNOSIS — N189 Chronic kidney disease, unspecified: Secondary | ICD-10-CM | POA: Diagnosis not present

## 2019-07-11 DIAGNOSIS — D631 Anemia in chronic kidney disease: Secondary | ICD-10-CM | POA: Diagnosis not present

## 2019-07-11 DIAGNOSIS — E1122 Type 2 diabetes mellitus with diabetic chronic kidney disease: Secondary | ICD-10-CM | POA: Diagnosis not present

## 2019-07-11 DIAGNOSIS — I10 Essential (primary) hypertension: Secondary | ICD-10-CM | POA: Diagnosis not present

## 2019-07-11 DIAGNOSIS — E7800 Pure hypercholesterolemia, unspecified: Secondary | ICD-10-CM | POA: Diagnosis not present

## 2019-07-11 DIAGNOSIS — E0822 Diabetes mellitus due to underlying condition with diabetic chronic kidney disease: Secondary | ICD-10-CM | POA: Diagnosis not present

## 2019-07-26 DIAGNOSIS — E1122 Type 2 diabetes mellitus with diabetic chronic kidney disease: Secondary | ICD-10-CM | POA: Diagnosis not present

## 2019-07-26 DIAGNOSIS — F419 Anxiety disorder, unspecified: Secondary | ICD-10-CM | POA: Diagnosis not present

## 2019-07-26 DIAGNOSIS — J9611 Chronic respiratory failure with hypoxia: Secondary | ICD-10-CM | POA: Diagnosis not present

## 2019-07-26 DIAGNOSIS — G4733 Obstructive sleep apnea (adult) (pediatric): Secondary | ICD-10-CM | POA: Diagnosis not present

## 2019-07-26 DIAGNOSIS — E7800 Pure hypercholesterolemia, unspecified: Secondary | ICD-10-CM | POA: Diagnosis not present

## 2019-07-26 DIAGNOSIS — J449 Chronic obstructive pulmonary disease, unspecified: Secondary | ICD-10-CM | POA: Diagnosis not present

## 2019-07-26 DIAGNOSIS — M109 Gout, unspecified: Secondary | ICD-10-CM | POA: Diagnosis not present

## 2019-07-26 DIAGNOSIS — I10 Essential (primary) hypertension: Secondary | ICD-10-CM | POA: Diagnosis not present

## 2019-07-26 DIAGNOSIS — G2581 Restless legs syndrome: Secondary | ICD-10-CM | POA: Diagnosis not present

## 2019-08-01 DIAGNOSIS — J9611 Chronic respiratory failure with hypoxia: Secondary | ICD-10-CM | POA: Diagnosis not present

## 2019-08-07 DIAGNOSIS — M5442 Lumbago with sciatica, left side: Secondary | ICD-10-CM | POA: Diagnosis not present

## 2019-08-07 DIAGNOSIS — G4733 Obstructive sleep apnea (adult) (pediatric): Secondary | ICD-10-CM | POA: Diagnosis not present

## 2019-08-07 DIAGNOSIS — R252 Cramp and spasm: Secondary | ICD-10-CM | POA: Diagnosis not present

## 2019-08-19 DIAGNOSIS — R7989 Other specified abnormal findings of blood chemistry: Secondary | ICD-10-CM | POA: Diagnosis not present

## 2019-08-19 DIAGNOSIS — E7800 Pure hypercholesterolemia, unspecified: Secondary | ICD-10-CM | POA: Diagnosis not present

## 2019-08-19 DIAGNOSIS — E1122 Type 2 diabetes mellitus with diabetic chronic kidney disease: Secondary | ICD-10-CM | POA: Diagnosis not present

## 2019-08-19 DIAGNOSIS — I10 Essential (primary) hypertension: Secondary | ICD-10-CM | POA: Diagnosis not present

## 2019-08-26 DIAGNOSIS — R7989 Other specified abnormal findings of blood chemistry: Secondary | ICD-10-CM | POA: Diagnosis not present

## 2019-08-29 DIAGNOSIS — J9611 Chronic respiratory failure with hypoxia: Secondary | ICD-10-CM | POA: Diagnosis not present

## 2019-09-04 DIAGNOSIS — G4733 Obstructive sleep apnea (adult) (pediatric): Secondary | ICD-10-CM | POA: Diagnosis not present

## 2019-09-04 DIAGNOSIS — M5442 Lumbago with sciatica, left side: Secondary | ICD-10-CM | POA: Diagnosis not present

## 2019-09-04 DIAGNOSIS — R252 Cramp and spasm: Secondary | ICD-10-CM | POA: Diagnosis not present

## 2019-09-12 ENCOUNTER — Other Ambulatory Visit: Payer: Self-pay

## 2019-09-12 ENCOUNTER — Ambulatory Visit: Payer: Medicare HMO | Admitting: Podiatry

## 2019-09-12 ENCOUNTER — Encounter: Payer: Self-pay | Admitting: Podiatry

## 2019-09-12 VITALS — BP 135/71 | HR 70 | Temp 98.5°F

## 2019-09-12 DIAGNOSIS — Z794 Long term (current) use of insulin: Secondary | ICD-10-CM | POA: Diagnosis not present

## 2019-09-12 DIAGNOSIS — B351 Tinea unguium: Secondary | ICD-10-CM

## 2019-09-12 DIAGNOSIS — L84 Corns and callosities: Secondary | ICD-10-CM

## 2019-09-12 DIAGNOSIS — E0822 Diabetes mellitus due to underlying condition with diabetic chronic kidney disease: Secondary | ICD-10-CM

## 2019-09-12 DIAGNOSIS — M79674 Pain in right toe(s): Secondary | ICD-10-CM

## 2019-09-12 DIAGNOSIS — N183 Chronic kidney disease, stage 3 unspecified: Secondary | ICD-10-CM | POA: Diagnosis not present

## 2019-09-12 DIAGNOSIS — M79675 Pain in left toe(s): Secondary | ICD-10-CM | POA: Diagnosis not present

## 2019-09-12 DIAGNOSIS — E119 Type 2 diabetes mellitus without complications: Secondary | ICD-10-CM | POA: Diagnosis not present

## 2019-09-12 NOTE — Patient Instructions (Signed)
Diabetes Mellitus and Foot Care Foot care is an important part of your health, especially when you have diabetes. Diabetes may cause you to have problems because of poor blood flow (circulation) to your feet and legs, which can cause your skin to:  Become thinner and drier.  Break more easily.  Heal more slowly.  Peel and crack. You may also have nerve damage (neuropathy) in your legs and feet, causing decreased feeling in them. This means that you may not notice minor injuries to your feet that could lead to more serious problems. Noticing and addressing any potential problems early is the best way to prevent future foot problems. How to care for your feet Foot hygiene  Wash your feet daily with warm water and mild soap. Do not use hot water. Then, pat your feet and the areas between your toes until they are completely dry. Do not soak your feet as this can dry your skin.  Trim your toenails straight across. Do not dig under them or around the cuticle. File the edges of your nails with an emery board or nail file.  Apply a moisturizing lotion or petroleum jelly to the skin on your feet and to dry, brittle toenails. Use lotion that does not contain alcohol and is unscented. Do not apply lotion between your toes. Shoes and socks  Wear clean socks or stockings every day. Make sure they are not too tight. Do not wear knee-high stockings since they may decrease blood flow to your legs.  Wear shoes that fit properly and have enough cushioning. Always look in your shoes before you put them on to be sure there are no objects inside.  To break in new shoes, wear them for just a few hours a day. This prevents injuries on your feet. Wounds, scrapes, corns, and calluses  Check your feet daily for blisters, cuts, bruises, sores, and redness. If you cannot see the bottom of your feet, use a mirror or ask someone for help.  Do not cut corns or calluses or try to remove them with medicine.  If you  find a minor scrape, cut, or break in the skin on your feet, keep it and the skin around it clean and dry. You may clean these areas with mild soap and water. Do not clean the area with peroxide, alcohol, or iodine.  If you have a wound, scrape, corn, or callus on your foot, look at it several times a day to make sure it is healing and not infected. Check for: ? Redness, swelling, or pain. ? Fluid or blood. ? Warmth. ? Pus or a bad smell. General instructions  Do not cross your legs. This may decrease blood flow to your feet.  Do not use heating pads or hot water bottles on your feet. They may burn your skin. If you have lost feeling in your feet or legs, you may not know this is happening until it is too late.  Protect your feet from hot and cold by wearing shoes, such as at the beach or on hot pavement.  Schedule a complete foot exam at least once a year (annually) or more often if you have foot problems. If you have foot problems, report any cuts, sores, or bruises to your health care provider immediately. Contact a health care provider if:  You have a medical condition that increases your risk of infection and you have any cuts, sores, or bruises on your feet.  You have an injury that is not   healing.  You have redness on your legs or feet.  You feel burning or tingling in your legs or feet.  You have pain or cramps in your legs and feet.  Your legs or feet are numb.  Your feet always feel cold.  You have pain around a toenail. Get help right away if:  You have a wound, scrape, corn, or callus on your foot and: ? You have pain, swelling, or redness that gets worse. ? You have fluid or blood coming from the wound, scrape, corn, or callus. ? Your wound, scrape, corn, or callus feels warm to the touch. ? You have pus or a bad smell coming from the wound, scrape, corn, or callus. ? You have a fever. ? You have a red line going up your leg. Summary  Check your feet every day  for cuts, sores, red spots, swelling, and blisters.  Moisturize feet and legs daily.  Wear shoes that fit properly and have enough cushioning.  If you have foot problems, report any cuts, sores, or bruises to your health care provider immediately.  Schedule a complete foot exam at least once a year (annually) or more often if you have foot problems. This information is not intended to replace advice given to you by your health care provider. Make sure you discuss any questions you have with your health care provider. Document Revised: 02/23/2019 Document Reviewed: 07/04/2016 Elsevier Patient Education  2020 [employer]  Onychomycosis/Fungal Toenails  WHAT IS IT? An infection that lies within the keratin of your nail plate that is caused by a fungus.  WHY ME? Fungal infections affect all ages, sexes, races, and creeds.  There may be many factors that predispose you to a fungal infection such as age, coexisting medical conditions such as diabetes, or an autoimmune disease; stress, medications, fatigue, genetics, etc.  Bottom line: fungus thrives in a warm, moist environment and your shoes offer such a location.  IS IT CONTAGIOUS? Theoretically, yes.  You do not want to share shoes, nail clippers or files with someone who has fungal toenails.  Walking around barefoot in the same room or sleeping in the same bed is unlikely to transfer the organism.  It is important to realize, however, that fungus can spread easily from one nail to the next on the same foot.  HOW DO WE TREAT THIS?  There are several ways to treat this condition.  Treatment may depend on many factors such as age, medications, pregnancy, liver and kidney conditions, etc.  It is best to ask your doctor which options are available to you.  5. No treatment.   Unlike many other medical concerns, you can live with this condition.  However for many people this can be a painful condition and may lead to ingrown toenails or a bacterial  infection.  It is recommended that you keep the nails cut short to help reduce the amount of fungal nail. 6. Topical treatment.  These range from herbal remedies to prescription strength nail lacquers.  About 40-50% effective, topicals require twice daily application for approximately 9 to 12 months or until an entirely new nail has grown out.  The most effective topicals are medical grade medications available through physicians offices. 7. Oral antifungal medications.  With an 80-90% cure rate, the most common oral medication requires 3 to 4 months of therapy and stays in your system for a year as the new nail grows out.  Oral antifungal medications do require blood work to make   sure it is a safe drug for you.  A liver function panel will be performed prior to starting the medication and after the first month of treatment.  It is important to have the blood work performed to avoid any harmful side effects.  In general, this medication safe but blood work is required. 8. Laser Therapy.  This treatment is performed by applying a specialized laser to the affected nail plate.  This therapy is noninvasive, fast, and non-painful.  It is not covered by insurance and is therefore, out of pocket.  The results have been very good with a 80-95% cure rate.  [facility] is the only practice in the area to offer this therapy. 9. Permanent Nail Avulsion.  Removing the entire nail so that a new nail will not grow back. 

## 2019-09-13 DIAGNOSIS — I10 Essential (primary) hypertension: Secondary | ICD-10-CM | POA: Diagnosis not present

## 2019-09-13 DIAGNOSIS — E7800 Pure hypercholesterolemia, unspecified: Secondary | ICD-10-CM | POA: Diagnosis not present

## 2019-09-13 DIAGNOSIS — N189 Chronic kidney disease, unspecified: Secondary | ICD-10-CM | POA: Diagnosis not present

## 2019-09-13 DIAGNOSIS — J449 Chronic obstructive pulmonary disease, unspecified: Secondary | ICD-10-CM | POA: Diagnosis not present

## 2019-09-13 DIAGNOSIS — E1122 Type 2 diabetes mellitus with diabetic chronic kidney disease: Secondary | ICD-10-CM | POA: Diagnosis not present

## 2019-09-13 DIAGNOSIS — N183 Chronic kidney disease, stage 3 unspecified: Secondary | ICD-10-CM | POA: Diagnosis not present

## 2019-09-13 DIAGNOSIS — E0822 Diabetes mellitus due to underlying condition with diabetic chronic kidney disease: Secondary | ICD-10-CM | POA: Diagnosis not present

## 2019-09-13 DIAGNOSIS — D631 Anemia in chronic kidney disease: Secondary | ICD-10-CM | POA: Diagnosis not present

## 2019-09-14 ENCOUNTER — Encounter: Payer: Self-pay | Admitting: Podiatry

## 2019-09-14 NOTE — Progress Notes (Signed)
Subjective: Kimberly Howell presents today referred by Seward Carol, MD for diabetic foot evaluation.  Patient relates 21 year history of diabetes.  Patient denies any history of foot wounds.  Patient relates numbness, tingling, burning, pins/needles sensations in feet. Today, patient c/o of painful, discolored, thick toenails which interfere with daily activities.  Pain is aggravated when wearing enclosed shoe gear.   Past Medical History:  Diagnosis Date  . Abdominal mass   . Appendix disease   . At high risk for falls   . Breast mass   . Chronic pain   . Chronic respiratory failure with hypoxia ([place])   . CKD stage 3 due to type 2 diabetes mellitus ([place])   . COPD (chronic obstructive pulmonary disease) ([place])   . Elevated d-dimer 03/11/2018  . Endometrial thickening on ultrasound   . Gout   . HTN (hypertension)   . Hyperlipidemia 03/11/2018  . Leukocytosis 03/11/2018  . Morbid obesity with BMI of 50.0-59.9, adult ([place]) 03/11/2018  . Non-insulin dependent type 2 diabetes mellitus ([place])   . Occasional tremors   . OSA (obstructive sleep apnea)   . RLS (restless legs syndrome)     Patient Active Problem List   Diagnosis Date Noted  . Acute encephalopathy 10/18/2018  . Morbid obesity with BMI of 50.0-59.9, adult ([place]) 10/18/2018  . Chronic respiratory failure ([place]) 10/18/2018  . CKD (chronic kidney disease) stage 3, GFR 30-59 ml/min 10/18/2018  . Essential hypertension 10/18/2018  . OSA on CPAP 10/18/2018  . Endometrial thickening on ultrasound   . Elevated d-dimer 03/11/2018  . Acute kidney injury superimposed on chronic kidney disease ([place]) 03/11/2018  . Leukocytosis 03/11/2018  . Morbid obesity with BMI of 50.0-59.9, adult ([place]) 03/11/2018  . Hyperlipidemia 03/11/2018  . Type 2 diabetes mellitus with hyperlipidemia ([place]) 03/11/2018  . Acute respiratory failure with hypoxia ([place]) 03/10/2018    Past Surgical History:  Procedure Laterality Date  . DILATION AND  CURETTAGE, DIAGNOSTIC / THERAPEUTIC    . KNEE SURGERY Bilateral   . TUBAL LIGATION  1979    Current Outpatient Medications on File Prior to Visit  Medication Sig Dispense Refill  . acetaminophen (TYLENOL) 325 MG tablet Take 2 tablets (650 mg total) by mouth every 6 (six) hours as needed for mild pain (or temp >/= 99.5).    Marland Kitchen amLODipine (NORVASC) 10 MG tablet Take 1 tablet (10 mg total) by mouth daily. 30 tablet 11  . amLODipine (NORVASC) 2.5 MG tablet Take 2.5 mg by mouth daily.    . ASPIRIN 81 PO Take 81 mg by mouth daily.    Marland Kitchen aspirin EC 81 MG tablet Take 81 mg by mouth daily.    . carvedilol (COREG) 25 MG tablet Take 25 mg by mouth 2 (two) times daily.    . cetirizine (ZYRTEC) 10 MG tablet Take 10 mg by mouth daily.    . colchicine 0.6 MG tablet Take 0.6 mg by mouth daily as needed (gout pain).     . COLCRYS 0.6 MG tablet Take 0.6 mg by mouth daily.    . diclofenac sodium (VOLTAREN) 1 % GEL Apply 2 g topically 4 (four) times daily as needed (pain).     Marland Kitchen esomeprazole (NEXIUM) 40 MG capsule Take 40 mg by mouth daily.    . febuxostat (ULORIC) 40 MG tablet Take 80 mg by mouth daily.    . Febuxostat (ULORIC) 80 MG TABS Take 80 mg by mouth. TAKE 1/2 TABLET BY MOUTH DAILY    .  ferrous sulfate 325 (65 FE) MG EC tablet Take 325 mg by mouth 2 (two) times daily with a meal.    . furosemide (LASIX) 20 MG tablet Take 20 mg by mouth daily.    Marland Kitchen HYDROcodone-acetaminophen (NORCO) 10-325 MG tablet Take 1 tablet by mouth daily as needed for pain.    Marland Kitchen HYDROcodone-acetaminophen (NORCO) 10-325 MG tablet Take 1 tablet by mouth daily as needed for moderate pain.     Marland Kitchen lisinopril (PRINIVIL,ZESTRIL) 40 MG tablet Take 40 mg by mouth daily.    Marland Kitchen lisinopril (ZESTRIL) 5 MG tablet     . meclizine (ANTIVERT) 25 MG tablet Take 25 mg by mouth daily.    . methocarbamol (ROBAXIN) 750 MG tablet Take 750 mg by mouth every 8 (eight) hours as needed for muscle spasms.  0  . Multiple Vitamin (MULTIVITAMIN WITH MINERALS)  TABS tablet Take 1 tablet by mouth daily.    . Multiple Vitamin (MULTIVITAMIN) tablet Take 1 tablet by mouth daily.    . Multiple Vitamins-Minerals (CENTRAVITES 50 PLUS) TABS Take 1 tablet by mouth daily.    . pramipexole (MIRAPEX) 0.125 MG tablet Take 0.125 mg by mouth 3 (three) times daily.    . pramipexole (MIRAPEX) 0.25 MG tablet Take 0.25 mg by mouth daily.    . pravastatin (PRAVACHOL) 40 MG tablet Take 40 mg by mouth daily.    . saxagliptin HCl (ONGLYZA) 2.5 MG TABS tablet Take 2.5 mg by mouth daily.    . sitaGLIPtin (JANUVIA) 100 MG tablet Take 50 mg by mouth daily.     . TRADJENTA 5 MG TABS tablet     . traMADol (ULTRAM) 50 MG tablet Take 50-100 mg by mouth daily.   0  . VENTOLIN HFA 108 (90 Base) MCG/ACT inhaler Inhale 2 puffs into the lungs every 6 (six) hours as needed. 8 g 5  . VENTOLIN HFA 108 (90 Base) MCG/ACT inhaler Take 2 puffs by mouth every 6 (six) hours as needed for wheezing or shortness of breath.     . VOLTAREN 1 % GEL Apply 2 g topically every 6 (six) hours.    Marland Kitchen ZYRTEC ALLERGY 10 MG tablet Take 10 mg by mouth daily.     No current facility-administered medications on file prior to visit.     Allergies  Allergen Reactions  . Prednisone     Increased blood sugars    Social History   Occupational History  . Not on file  Tobacco Use  . Smoking status: Former Smoker    Quit date: 1995    Years since quitting: 26.2  . Smokeless tobacco: Never Used  Substance and Sexual Activity  . Alcohol use: Not Currently  . Drug use: Not Currently  . Sexual activity: Not on file    Family History  Problem Relation Age of Onset  . Anxiety disorder Mother   . GI Bleed Mother   . Hypertension Mother   . Colonic polyp Mother   . Kidney disease Mother   . Prostate cancer Father   . Lung cancer Father   . Alcohol abuse Father   . Cancer - Colon Father   . Colon polyps Father   . Drug abuse Brother     There is no immunization history on file for this  patient.  Review of systems: Positive Findings in bold print.  Constitutional:  chills, fatigue, fever, sweats, weight change Communication: Optometrist, sign Ecologist, hand writing, iPad/Android device Head: headaches, head injury Eyes: changes in vision,  eye pain, glaucoma, cataracts, macular degeneration, diplopia, glare,  light sensitivity, eyeglasses or contacts, blindness Ears nose mouth throat: hearing impaired, hearing aids,  ringing in ears, deaf, sign language,  vertigo, nosebleeds,  rhinitis,  cold sores, snoring, swollen glands Cardiovascular: HTN, edema, arrhythmia, pacemaker in place, defibrillator in place, chest pain/tightness, chronic anticoagulation, blood clot, heart failure, MI Peripheral Vascular: leg cramps, varicose veins, blood clots, lymphedema, varicosities Respiratory:  difficulty breathing, denies congestion, SOB, wheezing, cough, emphysema Gastrointestinal: change in appetite or weight, abdominal pain, constipation, diarrhea, nausea, vomiting, vomiting blood, change in bowel habits, abdominal pain, jaundice, rectal bleeding, hemorrhoids, GERD Genitourinary:  nocturia,  pain on urination, polyuria,  blood in urine, Foley catheter, urinary urgency, ESRD on hemodialysis Musculoskeletal: amputation, cramping, stiff joints, painful joints, decreased joint motion, fractures, OA, gout, hemiplegia, paraplegia, uses cane, wheelchair bound, uses walker, uses rollator Skin: +changes in toenails, color change, dryness, itching, mole changes,  rash, wound(s) Neurological: headaches, numbness in feet, paresthesias in feet, burning in feet, fainting,  seizures, change in speech,  headaches, memory problems/poor historian, cerebral palsy, weakness, paralysis, CVA, TIA Endocrine: diabetes, hypothyroidism, hyperthyroidism,  goiter, dry mouth, flushing, heat intolerance,  cold intolerance,  excessive thirst, denies polyuria,  nocturia Hematological:  easy bleeding, excessive  bleeding, easy bruising, enlarged lymph nodes, on long term blood thinner, history of past transusions Allergy/immunological:  hives, eczema, frequent infections, multiple drug allergies, seasonal allergies, transplant recipient, multiple food allergies Psychiatric:  anxiety, depression, mood disorder, suicidal ideations, hallucinations, insomnia  Objective: Vitals:   09/12/19 1459  BP: 135/71  Pulse: 70  Temp: 98.5 F (36.9 C)    74 y.o. AA female morbidly obese, IN NAD. AAO X 3.  Vascular Examination: Capillary fill time to digits <3 seconds b/l. Palpable DP pulses b/l. Faintly palpable PT pulses b/l. Pedal hair absent b/l Skin temperature gradient within normal limits b/l. Trace edema noted b/l feet.  Dermatological Examination: Pedal skin with normal turgor, texture and tone bilaterally. No open wounds bilaterally. No interdigital macerations bilaterally. Toenails 1-5 b/l elongated, dystrophic, thickened, crumbly with subungual debris and tenderness to dorsal palpation. Hyperkeratotic lesion(s) plantar aspect b/l heel pads.  No erythema, no edema, no drainage, no flocculence.  Musculoskeletal Examination: Normal muscle strength 5/5 to all lower extremity muscle groups bilaterally, no gross bony deformities bilaterally and no pain crepitus or joint limitation noted with ROM b/l  Neurological Examination: Protective sensation intact 5/5 intact bilaterally with 10g monofilament b/l Vibratory sensation intact b/l  Assessment: 1. Pain due to onychomycosis of toenails of both feet   2. Callus   3. Diabetes mellitus due to underlying condition with stage 3 chronic kidney disease, with long-term current use of insulin, unspecified whether stage 3a or 3b CKD ([place])   4. Encounter for diabetic foot exam ([place])     Plan: -Diabetic foot examination performed on today's visit. -Discussed diabetic foot care principles. Literature dispensed on today. -Toenails 1-5 b/l were debrided in length  and girth with sterile nail nippers and dremel without iatrogenic bleeding.  -Callus(es) plantar aspect b/l heel pads were debrided without complication or incident. Total number debrided =2. -Patient to continue soft, supportive shoe gear daily. -Patient to report any pedal injuries to medical professional immediately. -Patient/POA to call should there be question/concern in the interim.  Return in about 3 months (around 12/13/2019) for diabetic nail trim.

## 2019-09-29 DIAGNOSIS — J9611 Chronic respiratory failure with hypoxia: Secondary | ICD-10-CM | POA: Diagnosis not present

## 2019-10-03 DIAGNOSIS — D631 Anemia in chronic kidney disease: Secondary | ICD-10-CM | POA: Diagnosis not present

## 2019-10-03 DIAGNOSIS — N189 Chronic kidney disease, unspecified: Secondary | ICD-10-CM | POA: Diagnosis not present

## 2019-10-03 DIAGNOSIS — N183 Chronic kidney disease, stage 3 unspecified: Secondary | ICD-10-CM | POA: Diagnosis not present

## 2019-10-03 DIAGNOSIS — E1122 Type 2 diabetes mellitus with diabetic chronic kidney disease: Secondary | ICD-10-CM | POA: Diagnosis not present

## 2019-10-03 DIAGNOSIS — E7800 Pure hypercholesterolemia, unspecified: Secondary | ICD-10-CM | POA: Diagnosis not present

## 2019-10-03 DIAGNOSIS — I10 Essential (primary) hypertension: Secondary | ICD-10-CM | POA: Diagnosis not present

## 2019-10-03 DIAGNOSIS — J449 Chronic obstructive pulmonary disease, unspecified: Secondary | ICD-10-CM | POA: Diagnosis not present

## 2019-10-03 DIAGNOSIS — E0822 Diabetes mellitus due to underlying condition with diabetic chronic kidney disease: Secondary | ICD-10-CM | POA: Diagnosis not present

## 2019-10-29 DIAGNOSIS — J9611 Chronic respiratory failure with hypoxia: Secondary | ICD-10-CM | POA: Diagnosis not present

## 2019-11-18 DIAGNOSIS — N183 Chronic kidney disease, stage 3 unspecified: Secondary | ICD-10-CM | POA: Diagnosis not present

## 2019-11-18 DIAGNOSIS — I129 Hypertensive chronic kidney disease with stage 1 through stage 4 chronic kidney disease, or unspecified chronic kidney disease: Secondary | ICD-10-CM | POA: Diagnosis not present

## 2019-11-18 DIAGNOSIS — E669 Obesity, unspecified: Secondary | ICD-10-CM | POA: Diagnosis not present

## 2019-11-18 DIAGNOSIS — E1122 Type 2 diabetes mellitus with diabetic chronic kidney disease: Secondary | ICD-10-CM | POA: Diagnosis not present

## 2019-11-18 DIAGNOSIS — Z6841 Body Mass Index (BMI) 40.0 and over, adult: Secondary | ICD-10-CM | POA: Diagnosis not present

## 2019-11-18 DIAGNOSIS — M109 Gout, unspecified: Secondary | ICD-10-CM | POA: Diagnosis not present

## 2019-11-29 DIAGNOSIS — J9611 Chronic respiratory failure with hypoxia: Secondary | ICD-10-CM | POA: Diagnosis not present

## 2019-12-14 DIAGNOSIS — E1122 Type 2 diabetes mellitus with diabetic chronic kidney disease: Secondary | ICD-10-CM | POA: Diagnosis not present

## 2019-12-14 DIAGNOSIS — N183 Chronic kidney disease, stage 3 unspecified: Secondary | ICD-10-CM | POA: Diagnosis not present

## 2019-12-14 DIAGNOSIS — I10 Essential (primary) hypertension: Secondary | ICD-10-CM | POA: Diagnosis not present

## 2019-12-14 DIAGNOSIS — N189 Chronic kidney disease, unspecified: Secondary | ICD-10-CM | POA: Diagnosis not present

## 2019-12-14 DIAGNOSIS — E7800 Pure hypercholesterolemia, unspecified: Secondary | ICD-10-CM | POA: Diagnosis not present

## 2019-12-14 DIAGNOSIS — J449 Chronic obstructive pulmonary disease, unspecified: Secondary | ICD-10-CM | POA: Diagnosis not present

## 2019-12-16 ENCOUNTER — Other Ambulatory Visit: Payer: Self-pay

## 2019-12-16 ENCOUNTER — Encounter: Payer: Self-pay | Admitting: Podiatry

## 2019-12-16 ENCOUNTER — Ambulatory Visit (INDEPENDENT_AMBULATORY_CARE_PROVIDER_SITE_OTHER): Payer: Medicare HMO | Admitting: Podiatry

## 2019-12-16 VITALS — Temp 95.1°F

## 2019-12-16 DIAGNOSIS — E0822 Diabetes mellitus due to underlying condition with diabetic chronic kidney disease: Secondary | ICD-10-CM | POA: Diagnosis not present

## 2019-12-16 DIAGNOSIS — B351 Tinea unguium: Secondary | ICD-10-CM

## 2019-12-16 DIAGNOSIS — M79675 Pain in left toe(s): Secondary | ICD-10-CM

## 2019-12-16 DIAGNOSIS — M79674 Pain in right toe(s): Secondary | ICD-10-CM

## 2019-12-16 DIAGNOSIS — N183 Chronic kidney disease, stage 3 unspecified: Secondary | ICD-10-CM

## 2019-12-16 DIAGNOSIS — L84 Corns and callosities: Secondary | ICD-10-CM

## 2019-12-16 DIAGNOSIS — Z794 Long term (current) use of insulin: Secondary | ICD-10-CM

## 2019-12-16 NOTE — Patient Instructions (Signed)
Apply Neosporin to left heel once daily for one week.   Diabetes Mellitus and Foot Care Foot care is an important part of your health, especially when you have diabetes. Diabetes may cause you to have problems because of poor blood flow (circulation) to your feet and legs, which can cause your skin to:  Become thinner and drier.  Break more easily.  Heal more slowly.  Peel and crack. You may also have nerve damage (neuropathy) in your legs and feet, causing decreased feeling in them. This means that you may not notice minor injuries to your feet that could lead to more serious problems. Noticing and addressing any potential problems early is the best way to prevent future foot problems. How to care for your feet Foot hygiene  Wash your feet daily with warm water and mild soap. Do not use hot water. Then, pat your feet and the areas between your toes until they are completely dry. Do not soak your feet as this can dry your skin.  Trim your toenails straight across. Do not dig under them or around the cuticle. File the edges of your nails with an emery board or nail file.  Apply a moisturizing lotion or petroleum jelly to the skin on your feet and to dry, brittle toenails. Use lotion that does not contain alcohol and is unscented. Do not apply lotion between your toes. Shoes and socks  Wear clean socks or stockings every day. Make sure they are not too tight. Do not wear knee-high stockings since they may decrease blood flow to your legs.  Wear shoes that fit properly and have enough cushioning. Always look in your shoes before you put them on to be sure there are no objects inside.  To break in new shoes, wear them for just a few hours a day. This prevents injuries on your feet. Wounds, scrapes, corns, and calluses  Check your feet daily for blisters, cuts, bruises, sores, and redness. If you cannot see the bottom of your feet, use a mirror or ask someone for help.  Do not cut corns or  calluses or try to remove them with medicine.  If you find a minor scrape, cut, or break in the skin on your feet, keep it and the skin around it clean and dry. You may clean these areas with mild soap and water. Do not clean the area with peroxide, alcohol, or iodine.  If you have a wound, scrape, corn, or callus on your foot, look at it several times a day to make sure it is healing and not infected. Check for: ? Redness, swelling, or pain. ? Fluid or blood. ? Warmth. ? Pus or a bad smell. General instructions  Do not cross your legs. This may decrease blood flow to your feet.  Do not use heating pads or hot water bottles on your feet. They may burn your skin. If you have lost feeling in your feet or legs, you may not know this is happening until it is too late.  Protect your feet from hot and cold by wearing shoes, such as at the beach or on hot pavement.  Schedule a complete foot exam at least once a year (annually) or more often if you have foot problems. If you have foot problems, report any cuts, sores, or bruises to your health care provider immediately. Contact a health care provider if:  You have a medical condition that increases your risk of infection and you have any cuts, sores, or  bruises on your feet.  You have an injury that is not healing.  You have redness on your legs or feet.  You feel burning or tingling in your legs or feet.  You have pain or cramps in your legs and feet.  Your legs or feet are numb.  Your feet always feel cold.  You have pain around a toenail. Get help right away if:  You have a wound, scrape, corn, or callus on your foot and: ? You have pain, swelling, or redness that gets worse. ? You have fluid or blood coming from the wound, scrape, corn, or callus. ? Your wound, scrape, corn, or callus feels warm to the touch. ? You have pus or a bad smell coming from the wound, scrape, corn, or callus. ? You have a fever. ? You have a red line  going up your leg. Summary  Check your feet every day for cuts, sores, red spots, swelling, and blisters.  Moisturize feet and legs daily.  Wear shoes that fit properly and have enough cushioning.  If you have foot problems, report any cuts, sores, or bruises to your health care provider immediately.  Schedule a complete foot exam at least once a year (annually) or more often if you have foot problems. This information is not intended to replace advice given to you by your health care provider. Make sure you discuss any questions you have with your health care provider. Document Revised: 02/23/2019 Document Reviewed: 07/04/2016 Elsevier Patient Education  [place].

## 2019-12-16 NOTE — Progress Notes (Signed)
Subjective: Kimberly Howell presents today at risk foot care. Pt has h/o NIDDM with chronic kidney disease and painful callus(es) b/l heels and painful thick toenails that are difficult to trim. Pain interferes with ambulation. Aggravating factors include wearing enclosed shoe gear. Pain is relieved with periodic professional debridement.   She states her heels are more painful on today's visit. Her sister is present during today's visit.  Seward Carol, MD is patient's PCP. Last visit was: 09/13/2019.  Past Medical History:  Diagnosis Date  . Abdominal mass   . Appendix disease   . At high risk for falls   . Breast mass   . Chronic pain   . Chronic respiratory failure with hypoxia ([place])   . CKD stage 3 due to type 2 diabetes mellitus ([place])   . COPD (chronic obstructive pulmonary disease) ([place])   . Elevated d-dimer 03/11/2018  . Endometrial thickening on ultrasound   . Gout   . HTN (hypertension)   . Hyperlipidemia 03/11/2018  . Leukocytosis 03/11/2018  . Morbid obesity with BMI of 50.0-59.9, adult ([place]) 03/11/2018  . Non-insulin dependent type 2 diabetes mellitus ([place])   . Occasional tremors   . OSA (obstructive sleep apnea)   . RLS (restless legs syndrome)      Patient Active Problem List   Diagnosis Date Noted  . Acute encephalopathy 10/18/2018  . Morbid obesity with BMI of 50.0-59.9, adult ([place]) 10/18/2018  . Chronic respiratory failure ([place]) 10/18/2018  . CKD (chronic kidney disease) stage 3, GFR 30-59 ml/min 10/18/2018  . Essential hypertension 10/18/2018  . OSA on CPAP 10/18/2018  . Endometrial thickening on ultrasound   . Elevated d-dimer 03/11/2018  . Acute kidney injury superimposed on chronic kidney disease ([place]) 03/11/2018  . Leukocytosis 03/11/2018  . Morbid obesity with BMI of 50.0-59.9, adult ([place]) 03/11/2018  . Hyperlipidemia 03/11/2018  . Type 2 diabetes mellitus with hyperlipidemia ([place]) 03/11/2018  . Acute respiratory failure with hypoxia ([place]) 03/10/2018     Current Outpatient Medications on File Prior to Visit  Medication Sig Dispense Refill  . ACCU-CHEK AVIVA PLUS test strip     . Accu-Chek Softclix Lancets lancets     . acetaminophen (TYLENOL) 325 MG tablet Take 2 tablets (650 mg total) by mouth every 6 (six) hours as needed for mild pain (or temp >/= 99.5).    Marland Kitchen Alcohol Swabs (B-D SINGLE USE SWABS REGULAR) PADS     . amLODipine (NORVASC) 2.5 MG tablet Take 2.5 mg by mouth daily.    . ASPIRIN 81 PO Take 81 mg by mouth daily.    Marland Kitchen aspirin EC 81 MG tablet Take 81 mg by mouth daily.    . carvedilol (COREG) 25 MG tablet Take 25 mg by mouth 2 (two) times daily.    . cetirizine (ZYRTEC) 10 MG tablet Take 10 mg by mouth daily.    . colchicine 0.6 MG tablet Take 0.6 mg by mouth daily as needed (gout pain).     . COLCRYS 0.6 MG tablet Take 0.6 mg by mouth daily.    . diclofenac sodium (VOLTAREN) 1 % GEL Apply 2 g topically 4 (four) times daily as needed (pain).     Marland Kitchen esomeprazole (NEXIUM) 40 MG capsule Take 40 mg by mouth daily.    . febuxostat (ULORIC) 40 MG tablet Take 80 mg by mouth daily.    . Febuxostat (ULORIC) 80 MG TABS Take 80 mg by mouth. TAKE 1/2 TABLET BY MOUTH DAILY    . ferrous sulfate  325 (65 FE) MG EC tablet Take 325 mg by mouth 2 (two) times daily with a meal.    . furosemide (LASIX) 20 MG tablet Take 20 mg by mouth daily.    Marland Kitchen glipiZIDE (GLUCOTROL XL) 2.5 MG 24 hr tablet     . HYDROcodone-acetaminophen (NORCO) 10-325 MG tablet Take 1 tablet by mouth daily as needed for pain.    Marland Kitchen HYDROcodone-acetaminophen (NORCO) 10-325 MG tablet Take 1 tablet by mouth daily as needed for moderate pain.     Marland Kitchen lisinopril (PRINIVIL,ZESTRIL) 40 MG tablet Take 40 mg by mouth daily.    Marland Kitchen lisinopril (ZESTRIL) 5 MG tablet     . meclizine (ANTIVERT) 25 MG tablet Take 25 mg by mouth daily.    . methocarbamol (ROBAXIN) 750 MG tablet Take 750 mg by mouth every 8 (eight) hours as needed for muscle spasms.  0  . Multiple Vitamin (MULTIVITAMIN WITH  MINERALS) TABS tablet Take 1 tablet by mouth daily.    . Multiple Vitamin (MULTIVITAMIN) tablet Take 1 tablet by mouth daily.    . Multiple Vitamins-Minerals (CENTRAVITES 50 PLUS) TABS Take 1 tablet by mouth daily.    . pramipexole (MIRAPEX) 0.125 MG tablet Take 0.125 mg by mouth 3 (three) times daily.    . pramipexole (MIRAPEX) 0.25 MG tablet Take 0.25 mg by mouth daily.    . pravastatin (PRAVACHOL) 40 MG tablet Take 40 mg by mouth daily.    . saxagliptin HCl (ONGLYZA) 2.5 MG TABS tablet Take 2.5 mg by mouth daily.    . TRADJENTA 5 MG TABS tablet     . traMADol (ULTRAM) 50 MG tablet Take 50-100 mg by mouth daily.   0  . VENTOLIN HFA 108 (90 Base) MCG/ACT inhaler Inhale 2 puffs into the lungs every 6 (six) hours as needed. 8 g 5  . VENTOLIN HFA 108 (90 Base) MCG/ACT inhaler Take 2 puffs by mouth every 6 (six) hours as needed for wheezing or shortness of breath.     . VOLTAREN 1 % GEL Apply 2 g topically every 6 (six) hours.    Marland Kitchen ZYRTEC ALLERGY 10 MG tablet Take 10 mg by mouth daily.    Marland Kitchen amLODipine (NORVASC) 10 MG tablet Take 1 tablet (10 mg total) by mouth daily. 30 tablet 11   No current facility-administered medications on file prior to visit.     Allergies  Allergen Reactions  . Prednisone     Increased blood sugars     Objective: Kimberly Howell is a pleasant 74 y.o. African American female morbidly obese in NAD. AAO x 3.  Vitals:   12/16/19 1015  Temp: (!) 95.1 F (35.1 C)    Vascular Examination: Neurovascular status unchanged b/l lower extremities. Capillary fill time to digits <3 seconds b/l lower extremities. Palpable DP pulse(s) b/l lower extremities Faintly palpable PT pulse(s) b/l lower extremities. Pedal hair absent. Lower extremity skin temperature gradient within normal limits. No pain with calf compression b/l. Nonpitting edema noted b/l lower extremities. Evidence of chronic venous insufficiency b/l LE.  Dermatological Examination: Pedal skin with normal turgor,  texture and tone bilaterally. No open wounds bilaterally. No interdigital macerations bilaterally. Toenails 1-5 b/l elongated, discolored, dystrophic, thickened, crumbly with subungual debris and tenderness to dorsal palpation. Hyperkeratotic lesion(s) plantar aspect b/l heel pads.  No erythema, no edema, no drainage, no flocculence.  Musculoskeletal: Normal muscle strength 5/5 to all lower extremity muscle groups bilaterally. No pain crepitus or joint limitation noted with ROM b/l. Pes planus  deformity noted b/l.  Wearing ill fitting shoe gear. Utilizes walker for ambulation assistance.   Wearing flat shoes on today's visit. They do not provide any shock absorption for her heels.  Neurological Examination: Protective sensation intact 5/5 intact bilaterally with 10g monofilament b/l. Vibratory sensation intact b/l. Clonus positive b/l.   Assessment: 1. Pain due to onychomycosis of toenails of both feet   2. Callus   3. Diabetes mellitus due to underlying condition with stage 3 chronic kidney disease, with long-term current use of insulin, unspecified whether stage 3a or 3b CKD ([place])    Plan: -Examined patient. -Continue diabetic foot care principles. -Toenails 1-5 b/l were debrided in length and girth with sterile nail nippers and dremel without iatrogenic bleeding.  -Callus(es) plantar aspect b/l heel pads pared utilizing sterile scalpel blade Total number debrided =2. -Iatrogenic laceration sustained during plantar aspect of heel . Treated with Lumicain Hemostatic Solution,  Alcohol, triple antibiotic ointment. Patient instructed to apply Neosporin to left heel once daily for one week. Call if she has any problems. -Reviewed shoe gear such as Skechers with memory foam insoles, Dr. Gibson Ramp slippers which she could purchase until she gets her diabetic shoes.  -Will schedule appointment with Pedorthist for diabetic shoe measurements. Qualifies based on today's examination. -Patient to report  any pedal injuries to medical professional immediately. -Patient to continue soft, supportive shoe gear daily. -Patient/POA to call should there be question/concern in the interim.  Return in about 3 months (around 03/17/2020).  Marzetta Board, DPM

## 2019-12-29 DIAGNOSIS — J9611 Chronic respiratory failure with hypoxia: Secondary | ICD-10-CM | POA: Diagnosis not present

## 2020-01-04 ENCOUNTER — Other Ambulatory Visit: Payer: Medicare HMO

## 2020-01-06 ENCOUNTER — Other Ambulatory Visit: Payer: Medicare HMO | Admitting: Orthotics

## 2020-01-13 ENCOUNTER — Other Ambulatory Visit: Payer: Medicare HMO | Admitting: Orthotics

## 2020-01-27 DIAGNOSIS — E1122 Type 2 diabetes mellitus with diabetic chronic kidney disease: Secondary | ICD-10-CM | POA: Diagnosis not present

## 2020-01-27 DIAGNOSIS — R9389 Abnormal findings on diagnostic imaging of other specified body structures: Secondary | ICD-10-CM | POA: Diagnosis not present

## 2020-01-27 DIAGNOSIS — N1832 Chronic kidney disease, stage 3b: Secondary | ICD-10-CM | POA: Diagnosis not present

## 2020-01-27 DIAGNOSIS — G4733 Obstructive sleep apnea (adult) (pediatric): Secondary | ICD-10-CM | POA: Diagnosis not present

## 2020-01-27 DIAGNOSIS — F419 Anxiety disorder, unspecified: Secondary | ICD-10-CM | POA: Diagnosis not present

## 2020-01-27 DIAGNOSIS — Z0000 Encounter for general adult medical examination without abnormal findings: Secondary | ICD-10-CM | POA: Diagnosis not present

## 2020-01-27 DIAGNOSIS — G8929 Other chronic pain: Secondary | ICD-10-CM | POA: Diagnosis not present

## 2020-01-27 DIAGNOSIS — M109 Gout, unspecified: Secondary | ICD-10-CM | POA: Diagnosis not present

## 2020-01-27 DIAGNOSIS — J9611 Chronic respiratory failure with hypoxia: Secondary | ICD-10-CM | POA: Diagnosis not present

## 2020-01-27 DIAGNOSIS — Z1389 Encounter for screening for other disorder: Secondary | ICD-10-CM | POA: Diagnosis not present

## 2020-01-27 DIAGNOSIS — I10 Essential (primary) hypertension: Secondary | ICD-10-CM | POA: Diagnosis not present

## 2020-01-29 DIAGNOSIS — J9611 Chronic respiratory failure with hypoxia: Secondary | ICD-10-CM | POA: Diagnosis not present

## 2020-01-31 DIAGNOSIS — Z7984 Long term (current) use of oral hypoglycemic drugs: Secondary | ICD-10-CM | POA: Diagnosis not present

## 2020-01-31 DIAGNOSIS — E0822 Diabetes mellitus due to underlying condition with diabetic chronic kidney disease: Secondary | ICD-10-CM | POA: Diagnosis not present

## 2020-01-31 DIAGNOSIS — J9611 Chronic respiratory failure with hypoxia: Secondary | ICD-10-CM | POA: Diagnosis not present

## 2020-02-19 DIAGNOSIS — G4733 Obstructive sleep apnea (adult) (pediatric): Secondary | ICD-10-CM | POA: Diagnosis not present

## 2020-02-21 DIAGNOSIS — D649 Anemia, unspecified: Secondary | ICD-10-CM | POA: Diagnosis not present

## 2020-02-23 DIAGNOSIS — G4733 Obstructive sleep apnea (adult) (pediatric): Secondary | ICD-10-CM | POA: Diagnosis not present

## 2020-02-29 DIAGNOSIS — J9611 Chronic respiratory failure with hypoxia: Secondary | ICD-10-CM | POA: Diagnosis not present

## 2020-03-01 ENCOUNTER — Other Ambulatory Visit: Payer: Self-pay | Admitting: Adult Health

## 2020-03-01 DIAGNOSIS — G4733 Obstructive sleep apnea (adult) (pediatric): Secondary | ICD-10-CM

## 2020-03-02 ENCOUNTER — Ambulatory Visit (INDEPENDENT_AMBULATORY_CARE_PROVIDER_SITE_OTHER): Payer: Medicare HMO | Admitting: Adult Health

## 2020-03-02 ENCOUNTER — Ambulatory Visit (INDEPENDENT_AMBULATORY_CARE_PROVIDER_SITE_OTHER): Payer: Medicare HMO

## 2020-03-02 ENCOUNTER — Other Ambulatory Visit: Payer: Self-pay

## 2020-03-02 ENCOUNTER — Encounter: Payer: Self-pay | Admitting: Adult Health

## 2020-03-02 ENCOUNTER — Telehealth: Payer: Self-pay | Admitting: Adult Health

## 2020-03-02 VITALS — BP 131/64 | HR 62 | Temp 98.2°F | Ht 60.0 in | Wt 311.6 lb

## 2020-03-02 DIAGNOSIS — J9612 Chronic respiratory failure with hypercapnia: Secondary | ICD-10-CM | POA: Diagnosis not present

## 2020-03-02 DIAGNOSIS — R0600 Dyspnea, unspecified: Secondary | ICD-10-CM | POA: Diagnosis not present

## 2020-03-02 DIAGNOSIS — Z9989 Dependence on other enabling machines and devices: Secondary | ICD-10-CM | POA: Diagnosis not present

## 2020-03-02 DIAGNOSIS — I509 Heart failure, unspecified: Secondary | ICD-10-CM | POA: Diagnosis not present

## 2020-03-02 DIAGNOSIS — F17200 Nicotine dependence, unspecified, uncomplicated: Secondary | ICD-10-CM | POA: Diagnosis not present

## 2020-03-02 DIAGNOSIS — J9611 Chronic respiratory failure with hypoxia: Secondary | ICD-10-CM | POA: Diagnosis not present

## 2020-03-02 DIAGNOSIS — G4733 Obstructive sleep apnea (adult) (pediatric): Secondary | ICD-10-CM | POA: Diagnosis not present

## 2020-03-02 DIAGNOSIS — I517 Cardiomegaly: Secondary | ICD-10-CM | POA: Diagnosis not present

## 2020-03-02 LAB — BASIC METABOLIC PANEL
BUN: 40 mg/dL — ABNORMAL HIGH (ref 6–23)
CO2: 40 mEq/L — ABNORMAL HIGH (ref 19–32)
Calcium: 8.7 mg/dL (ref 8.4–10.5)
Chloride: 99 mEq/L (ref 96–112)
Creatinine, Ser: 1.85 mg/dL — ABNORMAL HIGH (ref 0.40–1.20)
GFR: 32.43 mL/min — ABNORMAL LOW (ref >60.00)
Glucose, Bld: 129 mg/dL — ABNORMAL HIGH (ref 70–99)
Potassium: 4.6 mEq/L (ref 3.5–5.1)
Sodium: 145 mEq/L (ref 135–145)

## 2020-03-02 LAB — CBC WITH DIFFERENTIAL/PLATELET
Basophils Absolute: 0 10*3/uL (ref 0.0–0.1)
Basophils Relative: 0.5 % (ref 0.0–3.0)
Eosinophils Absolute: 0.1 10*3/uL (ref 0.0–0.7)
Eosinophils Relative: 1.4 % (ref 0.0–5.0)
HCT: 28.4 % — ABNORMAL LOW (ref 36.0–46.0)
Hemoglobin: 8.8 g/dL — ABNORMAL LOW (ref 12.0–15.0)
Lymphocytes Relative: 9.2 % — ABNORMAL LOW (ref 12.0–46.0)
Lymphs Abs: 0.8 10*3/uL (ref 0.7–4.0)
MCHC: 31.2 g/dL (ref 30.0–36.0)
MCV: 97 fl (ref 78.0–100.0)
Monocytes Absolute: 0.6 10*3/uL (ref 0.1–1.0)
Monocytes Relative: 6.6 % (ref 3.0–12.0)
Neutro Abs: 7 10*3/uL (ref 1.4–7.7)
Neutrophils Relative %: 82.3 % — ABNORMAL HIGH (ref 43.0–77.0)
Platelets: 165 10*3/uL (ref 150.0–400.0)
RBC: 2.93 Mil/uL — ABNORMAL LOW (ref 3.87–5.11)
RDW: 14.8 % (ref 11.5–15.5)
WBC: 8.5 10*3/uL (ref 4.0–10.5)

## 2020-03-02 LAB — BRAIN NATRIURETIC PEPTIDE: Pro B Natriuretic peptide (BNP): 143 pg/mL — ABNORMAL HIGH (ref 0.0–100.0)

## 2020-03-02 NOTE — Progress Notes (Signed)
'@Patient'  ID: Kimberly Howell, female    DOB: 1945-10-02, 74 y.o.   MRN: [id number]  Chief Complaint  Patient presents with  . Follow-up    OSA     Referring provider: Seward Carol, MD  HPI: 74 year old former heavy smoker  followed for obstructive sleep apnea , chronic respiratory failure on Oxygen   TEST/EVENTS :  PSG August 11, 2018 AHI 13.7/hour SPO2 low 88%  03/02/2020 Follow up ; OSA and O2 RF  Patient returns for a follow-up visit for obstructive sleep apnea.  Patient was last seen in 2019.  Had previously been on CPAP.  With some CPAP difficulties.  She had a repeat sleep study August 11, 2018 that showed mild sleep apnea with AHI 13.7/hour with SPO2 low at 88%. Since last visit patient says she has been referred to another sleep doctor. She has had a repeat sleep study with reported as Severe OSA . She has been recommended for CPAP titration study .  She is here today to discuss dyspnea and Oxygen dependence. Continue to get winded with minimal activity . On Oxygen 4l/m . Minimal help at home.  Unsteady on feet, prone to frequent falls, falls asleep easily when sitting .  Has trouble walking due to dyspnea. Has mild dry cough and wheezing .  Former heavy smoker .    Allergies  Allergen Reactions  . Prednisone     Increased blood sugars      There is no immunization history on file for this patient.  Past Medical History:  Diagnosis Date  . Abdominal mass   . Appendix disease   . At high risk for falls   . Breast mass   . Chronic pain   . Chronic respiratory failure with hypoxia ([place])   . CKD stage 3 due to type 2 diabetes mellitus ([place])   . COPD (chronic obstructive pulmonary disease) ([place])   . Elevated d-dimer 03/11/2018  . Endometrial thickening on ultrasound   . Gout   . HTN (hypertension)   . Hyperlipidemia 03/11/2018  . Leukocytosis 03/11/2018  . Morbid obesity with BMI of 50.0-59.9, adult ([place]) 03/11/2018  . Non-insulin dependent type 2 diabetes  mellitus ([place])   . Occasional tremors   . OSA (obstructive sleep apnea)   . RLS (restless legs syndrome)     Tobacco History: Social History   Tobacco Use  Smoking Status Former Smoker  . Packs/day: 1.00  . Years: 15.00  . Pack years: 15.00  . Types: Cigarettes  . Quit date: 61  . Years since quitting: 26.7  Smokeless Tobacco Never Used   Counseling given: Not Answered   Outpatient Medications Prior to Visit  Medication Sig Dispense Refill  . ACCU-CHEK AVIVA PLUS test strip     . Accu-Chek Softclix Lancets lancets     . acetaminophen (TYLENOL) 325 MG tablet Take 2 tablets (650 mg total) by mouth every 6 (six) hours as needed for mild pain (or temp >/= 99.5).    Marland Kitchen Alcohol Swabs (B-D SINGLE USE SWABS REGULAR) PADS     . amLODipine (NORVASC) 2.5 MG tablet Take 2.5 mg by mouth daily.    . ASPIRIN 81 PO Take 81 mg by mouth daily.    Marland Kitchen aspirin EC 81 MG tablet Take 81 mg by mouth daily.    . carvedilol (COREG) 25 MG tablet Take 25 mg by mouth 2 (two) times daily.    . cetirizine (ZYRTEC) 10 MG tablet Take 10 mg by mouth daily.    Marland Kitchen  colchicine 0.6 MG tablet Take 0.6 mg by mouth daily as needed (gout pain).     . COLCRYS 0.6 MG tablet Take 0.6 mg by mouth daily.    . diclofenac sodium (VOLTAREN) 1 % GEL Apply 2 g topically 4 (four) times daily as needed (pain).     Marland Kitchen esomeprazole (NEXIUM) 40 MG capsule Take 40 mg by mouth daily.    . febuxostat (ULORIC) 40 MG tablet Take 80 mg by mouth daily.    . Febuxostat (ULORIC) 80 MG TABS Take 80 mg by mouth. TAKE 1/2 TABLET BY MOUTH DAILY    . ferrous sulfate 325 (65 FE) MG EC tablet Take 325 mg by mouth 2 (two) times daily with a meal.    . furosemide (LASIX) 20 MG tablet Take 20 mg by mouth daily.    Marland Kitchen glipiZIDE (GLUCOTROL XL) 2.5 MG 24 hr tablet     . HYDROcodone-acetaminophen (NORCO) 10-325 MG tablet Take 1 tablet by mouth daily as needed for pain.    Marland Kitchen HYDROcodone-acetaminophen (NORCO) 10-325 MG tablet Take 1 tablet by mouth daily as  needed for moderate pain.     Marland Kitchen lisinopril (PRINIVIL,ZESTRIL) 40 MG tablet Take 40 mg by mouth daily.    Marland Kitchen lisinopril (ZESTRIL) 5 MG tablet     . meclizine (ANTIVERT) 25 MG tablet Take 25 mg by mouth daily.    . methocarbamol (ROBAXIN) 750 MG tablet Take 750 mg by mouth every 8 (eight) hours as needed for muscle spasms.  0  . Multiple Vitamin (MULTIVITAMIN WITH MINERALS) TABS tablet Take 1 tablet by mouth daily.    . Multiple Vitamin (MULTIVITAMIN) tablet Take 1 tablet by mouth daily.    . Multiple Vitamins-Minerals (CENTRAVITES 50 PLUS) TABS Take 1 tablet by mouth daily.    . pramipexole (MIRAPEX) 0.125 MG tablet Take 0.125 mg by mouth 3 (three) times daily.    . pramipexole (MIRAPEX) 0.25 MG tablet Take 0.25 mg by mouth daily.    . pravastatin (PRAVACHOL) 40 MG tablet Take 40 mg by mouth daily.    . saxagliptin HCl (ONGLYZA) 2.5 MG TABS tablet Take 2.5 mg by mouth daily.    . TRADJENTA 5 MG TABS tablet     . traMADol (ULTRAM) 50 MG tablet Take 50-100 mg by mouth daily.   0  . VENTOLIN HFA 108 (90 Base) MCG/ACT inhaler Inhale 2 puffs into the lungs every 6 (six) hours as needed. 8 g 5  . VENTOLIN HFA 108 (90 Base) MCG/ACT inhaler Take 2 puffs by mouth every 6 (six) hours as needed for wheezing or shortness of breath.     . VOLTAREN 1 % GEL Apply 2 g topically every 6 (six) hours.    Marland Kitchen ZYRTEC ALLERGY 10 MG tablet Take 10 mg by mouth daily.    Marland Kitchen amLODipine (NORVASC) 10 MG tablet Take 1 tablet (10 mg total) by mouth daily. 30 tablet 11   No facility-administered medications prior to visit.     Review of Systems:   Constitutional:   No  weight loss, night sweats,  Fevers, chills, + fatigue, or  lassitude.  HEENT:   No headaches,  Difficulty swallowing,  Tooth/dental problems, or  Sore throat,                No sneezing, itching, ear ache, nasal congestion, post nasal drip,   CV:  No chest pain,  Orthopnea, PND, swelling in lower extremities, anasarca, dizziness, palpitations, syncope.    GI  No heartburn,  indigestion, abdominal pain, nausea, vomiting, diarrhea, change in bowel habits, loss of appetite, bloody stools.   Resp:  .  No chest wall deformity  Skin: no rash or lesions.  GU: no dysuria, change in color of urine, no urgency or frequency.  No flank pain, no hematuria   MS:  No joint pain or swelling.  No decreased range of motion.  No back pain.    Physical Exam  BP 131/64 (BP Location: Right Arm, Cuff Size: Normal)   Pulse 62   Temp 98.2 F (36.8 C) (Temporal)   Ht 5' (1.524 m)   Wt (!) 311 lb 9.6 oz (141.3 kg)   SpO2 96%   BMI 60.86 kg/m   GEN: A/Ox3; pleasant , NAD, BMI 60 , wheelchair , O2   HEENT:  /AT,   NOSE-clear, THROAT-clear, no lesions, no postnasal drip or exudate noted.   NECK:  Supple w/ fair ROM; no JVD; normal carotid impulses w/o bruits; no thyromegaly or nodules palpated; no lymphadenopathy.    RESP  Clear  P & A; w/o, wheezes/ rales/ or rhonchi. no accessory muscle use, no dullness to percussion  CARD:  RRR, no m/r/g, 1-2 +  peripheral edema, pulses intact, no cyanosis or clubbing.  GI:   Soft & nt; nml bowel sounds; no organomegaly or masses detected.   Musco: Warm bil, no deformities or joint swelling noted.   Neuro: alert, no focal deficits noted.    Skin: Warm, no lesions or rashes    Lab Results:  CBC   No results found for: BNP  ProBNP No results found for: PROBNP  Imaging: No results found.    No flowsheet data found.  No results found for: NITRICOXIDE      Assessment & Plan:   Chronic respiratory failure ([place]) Chronic oxygen dependent respiratory failure with suspected combination of OHS/OSA Previous lab work shows chronic hypercarbia and hypoxemia. Definitely agree with proceeding with a CPAP titration study will check be met today look at her bicarb level. Would continue on lowest oxygen level to keep her O2 saturations greater than 88 to 90%. Check chest x-ray and labs with a BNP and  CBC as previous labs showed significant anemia previously Will need PFTs on return as she has a history of heavy smoking may have a component of COPD as well  Plan  Patient Instructions  Agree to proceed with CPAP titration study .  Continue on Oxygen 4l/m  Chest xray and labs today .  Avoid sedating medications if possible  Ventolin 1-2 puffs every 6hrs as needed.  Follow up with Dr. Valeta Harms in 4-6 weeks with PFTs and As needed   Please contact office for sooner follow up if symptoms do not improve or worsen or seek emergency care          OSA on CPAP Continue follow-up with sleep specialist.  And proceed with CPAP titration as recommended Patient is on several sedating medications have recommended that she limit these use if possible and discussed with her primary doctor     Rexene Edison, NP 03/02/2020

## 2020-03-02 NOTE — Addendum Note (Signed)
Addended by: Tery Sanfilippo R on: 03/02/2020 04:23 PM   Modules accepted: Orders

## 2020-03-02 NOTE — Telephone Encounter (Signed)
Spoke with patient regarding prior message. Patient asked for Xray result's and also asked about fluid wants to discus this with Lynelle Smoke   Tammy can you please advise   Thank you.

## 2020-03-02 NOTE — Patient Instructions (Addendum)
Agree to proceed with CPAP titration study .  Continue on Oxygen 4l/m  Chest xray and labs today .  Avoid sedating medications if possible  Ventolin 1-2 puffs every 6hrs as needed.  Follow up with Dr. Valeta Harms in 4-6 weeks with PFTs and As needed   Please contact office for sooner follow up if symptoms do not improve or worsen or seek emergency care

## 2020-03-02 NOTE — Assessment & Plan Note (Addendum)
Continue follow-up with sleep specialist.  And proceed with CPAP titration as recommended Patient is on several sedating medications have recommended that she limit these use if possible and discussed with her primary doctor

## 2020-03-02 NOTE — Assessment & Plan Note (Addendum)
Chronic oxygen dependent respiratory failure with suspected combination of OHS/OSA Previous lab work shows chronic hypercarbia and hypoxemia. Definitely agree with proceeding with a CPAP titration study will check be met today look at her bicarb level. Would continue on lowest oxygen level to keep her O2 saturations greater than 88 to 90%. Check chest x-ray and labs with a BNP and CBC as previous labs showed significant anemia previously Will need PFTs on return as she has a history of heavy smoking may have a component of COPD as well  Plan  Patient Instructions  Agree to proceed with CPAP titration study .  Continue on Oxygen 4l/m  Chest xray and labs today .  Avoid sedating medications if possible  Ventolin 1-2 puffs every 6hrs as needed.  Follow up with Dr. Valeta Harms in 4-6 weeks with PFTs and As needed   Please contact office for sooner follow up if symptoms do not improve or worsen or seek emergency care

## 2020-03-02 NOTE — Telephone Encounter (Signed)
Wanted advice on fluid intake while on lasix   Advised try to avoid excess fluid intake by using hard rock candy for thirst but not na 145 so don't want to avoid fluid completely

## 2020-03-03 ENCOUNTER — Emergency Department (HOSPITAL_COMMUNITY): Payer: Medicare HMO

## 2020-03-03 ENCOUNTER — Other Ambulatory Visit: Payer: Self-pay

## 2020-03-03 ENCOUNTER — Inpatient Hospital Stay (HOSPITAL_COMMUNITY)
Admission: EM | Admit: 2020-03-03 | Discharge: 2020-04-11 | DRG: 004 | Disposition: A | Discharge status: Skilled Nursing Facility | Payer: Medicare HMO | Attending: Pulmonary Disease | Admitting: Pulmonary Disease

## 2020-03-03 ENCOUNTER — Inpatient Hospital Stay (HOSPITAL_COMMUNITY): Payer: Medicare HMO

## 2020-03-03 ENCOUNTER — Encounter (HOSPITAL_COMMUNITY): Payer: Self-pay

## 2020-03-03 DIAGNOSIS — Z930 Tracheostomy status: Secondary | ICD-10-CM

## 2020-03-03 DIAGNOSIS — A419 Sepsis, unspecified organism: Secondary | ICD-10-CM

## 2020-03-03 DIAGNOSIS — Z9911 Dependence on respirator [ventilator] status: Secondary | ICD-10-CM

## 2020-03-03 DIAGNOSIS — R509 Fever, unspecified: Secondary | ICD-10-CM

## 2020-03-03 DIAGNOSIS — G931 Anoxic brain damage, not elsewhere classified: Secondary | ICD-10-CM

## 2020-03-03 DIAGNOSIS — R0682 Tachypnea, not elsewhere classified: Secondary | ICD-10-CM

## 2020-03-03 DIAGNOSIS — J9621 Acute and chronic respiratory failure with hypoxia: Secondary | ICD-10-CM

## 2020-03-03 DIAGNOSIS — R042 Hemoptysis: Secondary | ICD-10-CM

## 2020-03-03 DIAGNOSIS — I517 Cardiomegaly: Secondary | ICD-10-CM | POA: Diagnosis not present

## 2020-03-03 DIAGNOSIS — R918 Other nonspecific abnormal finding of lung field: Secondary | ICD-10-CM | POA: Diagnosis not present

## 2020-03-03 DIAGNOSIS — R5381 Other malaise: Secondary | ICD-10-CM | POA: Diagnosis not present

## 2020-03-03 DIAGNOSIS — I509 Heart failure, unspecified: Secondary | ICD-10-CM | POA: Diagnosis not present

## 2020-03-03 DIAGNOSIS — S062X9A Diffuse traumatic brain injury with loss of consciousness of unspecified duration, initial encounter: Secondary | ICD-10-CM | POA: Diagnosis present

## 2020-03-03 DIAGNOSIS — J9602 Acute respiratory failure with hypercapnia: Secondary | ICD-10-CM | POA: Diagnosis not present

## 2020-03-03 DIAGNOSIS — R251 Tremor, unspecified: Secondary | ICD-10-CM | POA: Diagnosis not present

## 2020-03-03 DIAGNOSIS — R6520 Severe sepsis without septic shock: Secondary | ICD-10-CM | POA: Diagnosis present

## 2020-03-03 DIAGNOSIS — M6250 Muscle wasting and atrophy, not elsewhere classified, unspecified site: Secondary | ICD-10-CM | POA: Diagnosis not present

## 2020-03-03 DIAGNOSIS — I469 Cardiac arrest, cause unspecified: Secondary | ICD-10-CM | POA: Diagnosis not present

## 2020-03-03 DIAGNOSIS — J9611 Chronic respiratory failure with hypoxia: Secondary | ICD-10-CM | POA: Diagnosis not present

## 2020-03-03 DIAGNOSIS — J9809 Other diseases of bronchus, not elsewhere classified: Secondary | ICD-10-CM | POA: Diagnosis not present

## 2020-03-03 DIAGNOSIS — E876 Hypokalemia: Secondary | ICD-10-CM | POA: Diagnosis not present

## 2020-03-03 DIAGNOSIS — H2100 Hyphema, unspecified eye: Secondary | ICD-10-CM | POA: Diagnosis present

## 2020-03-03 DIAGNOSIS — T4275XA Adverse effect of unspecified antiepileptic and sedative-hypnotic drugs, initial encounter: Secondary | ICD-10-CM | POA: Diagnosis not present

## 2020-03-03 DIAGNOSIS — G9389 Other specified disorders of brain: Secondary | ICD-10-CM | POA: Diagnosis present

## 2020-03-03 DIAGNOSIS — Z20822 Contact with and (suspected) exposure to covid-19: Secondary | ICD-10-CM | POA: Diagnosis not present

## 2020-03-03 DIAGNOSIS — J8489 Other specified interstitial pulmonary diseases: Secondary | ICD-10-CM | POA: Diagnosis not present

## 2020-03-03 DIAGNOSIS — J449 Chronic obstructive pulmonary disease, unspecified: Secondary | ICD-10-CM | POA: Diagnosis not present

## 2020-03-03 DIAGNOSIS — S0232XA Fracture of orbital floor, left side, initial encounter for closed fracture: Secondary | ICD-10-CM | POA: Diagnosis not present

## 2020-03-03 DIAGNOSIS — N170 Acute kidney failure with tubular necrosis: Secondary | ICD-10-CM | POA: Diagnosis not present

## 2020-03-03 DIAGNOSIS — Z043 Encounter for examination and observation following other accident: Secondary | ICD-10-CM | POA: Diagnosis not present

## 2020-03-03 DIAGNOSIS — D649 Anemia, unspecified: Secondary | ICD-10-CM | POA: Diagnosis not present

## 2020-03-03 DIAGNOSIS — A4102 Sepsis due to Methicillin resistant Staphylococcus aureus: Secondary | ICD-10-CM | POA: Diagnosis not present

## 2020-03-03 DIAGNOSIS — I130 Hypertensive heart and chronic kidney disease with heart failure and stage 1 through stage 4 chronic kidney disease, or unspecified chronic kidney disease: Secondary | ICD-10-CM | POA: Diagnosis present

## 2020-03-03 DIAGNOSIS — S0012XA Contusion of left eyelid and periocular area, initial encounter: Secondary | ICD-10-CM | POA: Diagnosis present

## 2020-03-03 DIAGNOSIS — J440 Chronic obstructive pulmonary disease with acute lower respiratory infection: Secondary | ICD-10-CM | POA: Diagnosis not present

## 2020-03-03 DIAGNOSIS — Z931 Gastrostomy status: Secondary | ICD-10-CM

## 2020-03-03 DIAGNOSIS — J9601 Acute respiratory failure with hypoxia: Secondary | ICD-10-CM | POA: Diagnosis not present

## 2020-03-03 DIAGNOSIS — J9691 Respiratory failure, unspecified with hypoxia: Secondary | ICD-10-CM | POA: Diagnosis not present

## 2020-03-03 DIAGNOSIS — E1122 Type 2 diabetes mellitus with diabetic chronic kidney disease: Secondary | ICD-10-CM | POA: Diagnosis present

## 2020-03-03 DIAGNOSIS — R001 Bradycardia, unspecified: Secondary | ICD-10-CM | POA: Diagnosis present

## 2020-03-03 DIAGNOSIS — N179 Acute kidney failure, unspecified: Secondary | ICD-10-CM | POA: Diagnosis not present

## 2020-03-03 DIAGNOSIS — G253 Myoclonus: Secondary | ICD-10-CM | POA: Diagnosis present

## 2020-03-03 DIAGNOSIS — S0990XA Unspecified injury of head, initial encounter: Secondary | ICD-10-CM | POA: Diagnosis not present

## 2020-03-03 DIAGNOSIS — E1165 Type 2 diabetes mellitus with hyperglycemia: Secondary | ICD-10-CM | POA: Diagnosis present

## 2020-03-03 DIAGNOSIS — I5030 Unspecified diastolic (congestive) heart failure: Secondary | ICD-10-CM | POA: Diagnosis present

## 2020-03-03 DIAGNOSIS — J398 Other specified diseases of upper respiratory tract: Secondary | ICD-10-CM | POA: Diagnosis present

## 2020-03-03 DIAGNOSIS — T1490XA Injury, unspecified, initial encounter: Secondary | ICD-10-CM

## 2020-03-03 DIAGNOSIS — S0003XA Contusion of scalp, initial encounter: Secondary | ICD-10-CM | POA: Diagnosis not present

## 2020-03-03 DIAGNOSIS — E662 Morbid (severe) obesity with alveolar hypoventilation: Secondary | ICD-10-CM | POA: Diagnosis present

## 2020-03-03 DIAGNOSIS — Z9289 Personal history of other medical treatment: Secondary | ICD-10-CM | POA: Diagnosis not present

## 2020-03-03 DIAGNOSIS — J9690 Respiratory failure, unspecified, unspecified whether with hypoxia or hypercapnia: Secondary | ICD-10-CM

## 2020-03-03 DIAGNOSIS — G8929 Other chronic pain: Secondary | ICD-10-CM | POA: Diagnosis not present

## 2020-03-03 DIAGNOSIS — J690 Pneumonitis due to inhalation of food and vomit: Secondary | ICD-10-CM | POA: Diagnosis not present

## 2020-03-03 DIAGNOSIS — Z6841 Body Mass Index (BMI) 40.0 and over, adult: Secondary | ICD-10-CM

## 2020-03-03 DIAGNOSIS — R404 Transient alteration of awareness: Secondary | ICD-10-CM | POA: Diagnosis not present

## 2020-03-03 DIAGNOSIS — E870 Hyperosmolality and hypernatremia: Secondary | ICD-10-CM | POA: Diagnosis not present

## 2020-03-03 DIAGNOSIS — Z23 Encounter for immunization: Secondary | ICD-10-CM

## 2020-03-03 DIAGNOSIS — G9340 Encephalopathy, unspecified: Secondary | ICD-10-CM | POA: Diagnosis not present

## 2020-03-03 DIAGNOSIS — J9622 Acute and chronic respiratory failure with hypercapnia: Secondary | ICD-10-CM | POA: Diagnosis not present

## 2020-03-03 DIAGNOSIS — E7800 Pure hypercholesterolemia, unspecified: Secondary | ICD-10-CM | POA: Diagnosis not present

## 2020-03-03 DIAGNOSIS — Z4682 Encounter for fitting and adjustment of non-vascular catheter: Secondary | ICD-10-CM | POA: Diagnosis not present

## 2020-03-03 DIAGNOSIS — J9811 Atelectasis: Secondary | ICD-10-CM | POA: Diagnosis not present

## 2020-03-03 DIAGNOSIS — R0602 Shortness of breath: Secondary | ICD-10-CM

## 2020-03-03 DIAGNOSIS — R6521 Severe sepsis with septic shock: Secondary | ICD-10-CM | POA: Diagnosis not present

## 2020-03-03 DIAGNOSIS — Z515 Encounter for palliative care: Secondary | ICD-10-CM

## 2020-03-03 DIAGNOSIS — G5139 Clonic hemifacial spasm, unspecified: Secondary | ICD-10-CM | POA: Diagnosis not present

## 2020-03-03 DIAGNOSIS — G9349 Other encephalopathy: Secondary | ICD-10-CM | POA: Diagnosis present

## 2020-03-03 DIAGNOSIS — N184 Chronic kidney disease, stage 4 (severe): Secondary | ICD-10-CM | POA: Diagnosis present

## 2020-03-03 DIAGNOSIS — J9692 Respiratory failure, unspecified with hypercapnia: Secondary | ICD-10-CM | POA: Diagnosis not present

## 2020-03-03 DIAGNOSIS — K5900 Constipation, unspecified: Secondary | ICD-10-CM | POA: Diagnosis not present

## 2020-03-03 DIAGNOSIS — J811 Chronic pulmonary edema: Secondary | ICD-10-CM | POA: Diagnosis not present

## 2020-03-03 DIAGNOSIS — I9589 Other hypotension: Secondary | ICD-10-CM | POA: Diagnosis not present

## 2020-03-03 DIAGNOSIS — I10 Essential (primary) hypertension: Secondary | ICD-10-CM | POA: Diagnosis not present

## 2020-03-03 DIAGNOSIS — D631 Anemia in chronic kidney disease: Secondary | ICD-10-CM | POA: Diagnosis present

## 2020-03-03 DIAGNOSIS — Z66 Do not resuscitate: Secondary | ICD-10-CM | POA: Diagnosis not present

## 2020-03-03 DIAGNOSIS — R0902 Hypoxemia: Secondary | ICD-10-CM

## 2020-03-03 DIAGNOSIS — J984 Other disorders of lung: Secondary | ICD-10-CM | POA: Diagnosis not present

## 2020-03-03 DIAGNOSIS — R079 Chest pain, unspecified: Secondary | ICD-10-CM | POA: Diagnosis not present

## 2020-03-03 DIAGNOSIS — R403 Persistent vegetative state: Secondary | ICD-10-CM | POA: Diagnosis not present

## 2020-03-03 DIAGNOSIS — Z452 Encounter for adjustment and management of vascular access device: Secondary | ICD-10-CM

## 2020-03-03 DIAGNOSIS — B379 Candidiasis, unspecified: Secondary | ICD-10-CM | POA: Diagnosis not present

## 2020-03-03 DIAGNOSIS — S199XXA Unspecified injury of neck, initial encounter: Secondary | ICD-10-CM | POA: Diagnosis not present

## 2020-03-03 DIAGNOSIS — K567 Ileus, unspecified: Secondary | ICD-10-CM | POA: Diagnosis not present

## 2020-03-03 DIAGNOSIS — R2681 Unsteadiness on feet: Secondary | ICD-10-CM | POA: Diagnosis present

## 2020-03-03 DIAGNOSIS — I959 Hypotension, unspecified: Secondary | ICD-10-CM | POA: Diagnosis present

## 2020-03-03 DIAGNOSIS — W19XXXA Unspecified fall, initial encounter: Secondary | ICD-10-CM | POA: Diagnosis present

## 2020-03-03 DIAGNOSIS — K8020 Calculus of gallbladder without cholecystitis without obstruction: Secondary | ICD-10-CM | POA: Diagnosis not present

## 2020-03-03 DIAGNOSIS — J181 Lobar pneumonia, unspecified organism: Secondary | ICD-10-CM | POA: Diagnosis not present

## 2020-03-03 DIAGNOSIS — J90 Pleural effusion, not elsewhere classified: Secondary | ICD-10-CM | POA: Diagnosis not present

## 2020-03-03 DIAGNOSIS — S0285XA Fracture of orbit, unspecified, initial encounter for closed fracture: Secondary | ICD-10-CM | POA: Diagnosis present

## 2020-03-03 DIAGNOSIS — J439 Emphysema, unspecified: Secondary | ICD-10-CM | POA: Diagnosis not present

## 2020-03-03 DIAGNOSIS — R34 Anuria and oliguria: Secondary | ICD-10-CM | POA: Diagnosis not present

## 2020-03-03 DIAGNOSIS — S0282XA Fracture of other specified skull and facial bones, left side, initial encounter for closed fracture: Secondary | ICD-10-CM | POA: Diagnosis present

## 2020-03-03 DIAGNOSIS — H93293 Other abnormal auditory perceptions, bilateral: Secondary | ICD-10-CM

## 2020-03-03 DIAGNOSIS — N183 Chronic kidney disease, stage 3 unspecified: Secondary | ICD-10-CM | POA: Diagnosis not present

## 2020-03-03 DIAGNOSIS — E638 Other specified nutritional deficiencies: Secondary | ICD-10-CM | POA: Diagnosis not present

## 2020-03-03 DIAGNOSIS — I499 Cardiac arrhythmia, unspecified: Secondary | ICD-10-CM | POA: Diagnosis not present

## 2020-03-03 DIAGNOSIS — R1310 Dysphagia, unspecified: Secondary | ICD-10-CM

## 2020-03-03 DIAGNOSIS — J9600 Acute respiratory failure, unspecified whether with hypoxia or hypercapnia: Secondary | ICD-10-CM | POA: Diagnosis not present

## 2020-03-03 DIAGNOSIS — Z9981 Dependence on supplemental oxygen: Secondary | ICD-10-CM

## 2020-03-03 DIAGNOSIS — R569 Unspecified convulsions: Secondary | ICD-10-CM | POA: Diagnosis not present

## 2020-03-03 DIAGNOSIS — G4700 Insomnia, unspecified: Secondary | ICD-10-CM | POA: Diagnosis not present

## 2020-03-03 DIAGNOSIS — R54 Age-related physical debility: Secondary | ICD-10-CM | POA: Diagnosis present

## 2020-03-03 DIAGNOSIS — R000 Tachycardia, unspecified: Secondary | ICD-10-CM | POA: Diagnosis not present

## 2020-03-03 DIAGNOSIS — R609 Edema, unspecified: Secondary | ICD-10-CM | POA: Diagnosis not present

## 2020-03-03 HISTORY — DX: Obesity, unspecified: E66.9

## 2020-03-03 LAB — CBC
HCT: 30.7 % — ABNORMAL LOW (ref 36.0–46.0)
HCT: 31.3 % — ABNORMAL LOW (ref 36.0–46.0)
Hemoglobin: 8.1 g/dL — ABNORMAL LOW (ref 12.0–15.0)
Hemoglobin: 8.5 g/dL — ABNORMAL LOW (ref 12.0–15.0)
MCH: 28.8 pg (ref 26.0–34.0)
MCH: 28.8 pg (ref 26.0–34.0)
MCHC: 26.4 g/dL — ABNORMAL LOW (ref 30.0–36.0)
MCHC: 27.2 g/dL — ABNORMAL LOW (ref 30.0–36.0)
MCV: 109.3 fL — ABNORMAL HIGH (ref 80.0–100.0)
MCV: 106.1 fL — ABNORMAL HIGH (ref 80.0–100.0)
Platelets: 154 10*3/uL (ref 150–400)
Platelets: 184 10*3/uL (ref 150–400)
RBC: 2.81 MIL/uL — ABNORMAL LOW (ref 3.87–5.11)
RBC: 2.95 MIL/uL — ABNORMAL LOW (ref 3.87–5.11)
RDW: 13.5 % (ref 11.5–15.5)
RDW: 13.6 % (ref 11.5–15.5)
WBC: 9.1 10*3/uL (ref 4.0–10.5)
WBC: 16.2 10*3/uL — ABNORMAL HIGH (ref 4.0–10.5)
nRBC: 0.4 % — ABNORMAL HIGH (ref 0.0–0.2)
nRBC: 0.1 % (ref 0.0–0.2)

## 2020-03-03 LAB — URINALYSIS, ROUTINE W REFLEX MICROSCOPIC
Bilirubin Urine: NEGATIVE
Glucose, UA: NEGATIVE mg/dL
Ketones, ur: NEGATIVE mg/dL
Leukocytes,Ua: NEGATIVE
Nitrite: POSITIVE — AB
Protein, ur: >300 mg/dL — AB
Specific Gravity, Urine: 1.025 (ref 1.005–1.030)
pH: 5.5 (ref 5.0–8.0)

## 2020-03-03 LAB — I-STAT CHEM 8, ED
BUN: 42 mg/dL — ABNORMAL HIGH (ref 8–23)
Calcium, Ion: 1.03 mmol/L — ABNORMAL LOW (ref 1.15–1.40)
Chloride: 98 mmol/L (ref 98–111)
Creatinine, Ser: 1.9 mg/dL — ABNORMAL HIGH (ref 0.44–1.00)
Glucose, Bld: 162 mg/dL — ABNORMAL HIGH (ref 70–99)
HCT: 28 % — ABNORMAL LOW (ref 36.0–46.0)
Hemoglobin: 9.5 g/dL — ABNORMAL LOW (ref 12.0–15.0)
Potassium: 5.3 mmol/L — ABNORMAL HIGH (ref 3.5–5.1)
Sodium: 142 mmol/L (ref 135–145)
TCO2: 35 mmol/L — ABNORMAL HIGH (ref 22–32)

## 2020-03-03 LAB — COMPREHENSIVE METABOLIC PANEL
ALT: 247 U/L — ABNORMAL HIGH (ref 0–44)
AST: 292 U/L — ABNORMAL HIGH (ref 15–41)
Albumin: 2.5 g/dL — ABNORMAL LOW (ref 3.5–5.0)
Alkaline Phosphatase: 92 U/L (ref 38–126)
Anion gap: 11 (ref 5–15)
BUN: 37 mg/dL — ABNORMAL HIGH (ref 8–23)
CO2: 32 mmol/L (ref 22–32)
Calcium: 8.4 mg/dL — ABNORMAL LOW (ref 8.9–10.3)
Chloride: 101 mmol/L (ref 98–111)
Creatinine, Ser: 2.08 mg/dL — ABNORMAL HIGH (ref 0.44–1.00)
GFR calc Af Amer: 27 mL/min — ABNORMAL LOW (ref >60)
GFR calc non Af Amer: 23 mL/min — ABNORMAL LOW (ref >60)
Glucose, Bld: 167 mg/dL — ABNORMAL HIGH (ref 70–99)
Potassium: 5.7 mmol/L — ABNORMAL HIGH (ref 3.5–5.1)
Sodium: 144 mmol/L (ref 135–145)
Total Bilirubin: 0.9 mg/dL (ref 0.3–1.2)
Total Protein: 5.9 g/dL — ABNORMAL LOW (ref 6.5–8.1)

## 2020-03-03 LAB — URINALYSIS, MICROSCOPIC (REFLEX)

## 2020-03-03 LAB — BASIC METABOLIC PANEL
Anion gap: 12 (ref 5–15)
Anion gap: 12 (ref 5–15)
BUN: 39 mg/dL — ABNORMAL HIGH (ref 8–23)
BUN: 43 mg/dL — ABNORMAL HIGH (ref 8–23)
CO2: 35 mmol/L — ABNORMAL HIGH (ref 22–32)
CO2: 33 mmol/L — ABNORMAL HIGH (ref 22–32)
Calcium: 8.6 mg/dL — ABNORMAL LOW (ref 8.9–10.3)
Calcium: 8.6 mg/dL — ABNORMAL LOW (ref 8.9–10.3)
Chloride: 98 mmol/L (ref 98–111)
Chloride: 100 mmol/L (ref 98–111)
Creatinine, Ser: 2.1 mg/dL — ABNORMAL HIGH (ref 0.44–1.00)
Creatinine, Ser: 2.51 mg/dL — ABNORMAL HIGH (ref 0.44–1.00)
GFR calc Af Amer: 27 mL/min — ABNORMAL LOW (ref >60)
GFR calc Af Amer: 22 mL/min — ABNORMAL LOW (ref >60)
GFR calc non Af Amer: 23 mL/min — ABNORMAL LOW (ref >60)
GFR calc non Af Amer: 19 mL/min — ABNORMAL LOW (ref >60)
Glucose, Bld: 175 mg/dL — ABNORMAL HIGH (ref 70–99)
Glucose, Bld: 86 mg/dL (ref 70–99)
Potassium: 5.4 mmol/L — ABNORMAL HIGH (ref 3.5–5.1)
Potassium: 5.4 mmol/L — ABNORMAL HIGH (ref 3.5–5.1)
Sodium: 145 mmol/L (ref 135–145)
Sodium: 145 mmol/L (ref 135–145)

## 2020-03-03 LAB — I-STAT ARTERIAL BLOOD GAS, ED
Acid-Base Excess: 12 mmol/L — ABNORMAL HIGH (ref 0.0–2.0)
Bicarbonate: 40.4 mmol/L — ABNORMAL HIGH (ref 20.0–28.0)
Calcium, Ion: 1.1 mmol/L — ABNORMAL LOW (ref 1.15–1.40)
HCT: 33 % — ABNORMAL LOW (ref 36.0–46.0)
Hemoglobin: 11.2 g/dL — ABNORMAL LOW (ref 12.0–15.0)
O2 Saturation: 95 %
Potassium: 5.1 mmol/L (ref 3.5–5.1)
Sodium: 142 mmol/L (ref 135–145)
TCO2: 43 mmol/L — ABNORMAL HIGH (ref 22–32)
pCO2 arterial: 75.5 mmHg (ref 32.0–48.0)
pH, Arterial: 7.336 — ABNORMAL LOW (ref 7.350–7.450)
pO2, Arterial: 84 mmHg (ref 83.0–108.0)

## 2020-03-03 LAB — HEMOGLOBIN A1C
Hgb A1c MFr Bld: 4.9 % (ref 4.8–5.6)
Mean Plasma Glucose: 93.93 mg/dL

## 2020-03-03 LAB — PROTIME-INR
INR: 1.2 (ref 0.8–1.2)
INR: 1.1 (ref 0.8–1.2)
INR: 1.1 (ref 0.8–1.2)
Prothrombin Time: 14.5 seconds (ref 11.4–15.2)
Prothrombin Time: 14.1 seconds (ref 11.4–15.2)
Prothrombin Time: 14 seconds (ref 11.4–15.2)

## 2020-03-03 LAB — TROPONIN I (HIGH SENSITIVITY)
Troponin I (High Sensitivity): 84 ng/L — ABNORMAL HIGH (ref <18)
Troponin I (High Sensitivity): 116 ng/L (ref <18)

## 2020-03-03 LAB — ECHOCARDIOGRAM COMPLETE
Area-P 1/2: 2.36 cm2
Height: 64 in
S' Lateral: 2.28 cm
Weight: 5120 oz

## 2020-03-03 LAB — SARS CORONAVIRUS 2 BY RT PCR (HOSPITAL ORDER, PERFORMED IN ~~LOC~~ HOSPITAL LAB): SARS Coronavirus 2: NEGATIVE

## 2020-03-03 LAB — GLUCOSE, CAPILLARY
Glucose-Capillary: 168 mg/dL — ABNORMAL HIGH (ref 70–99)
Glucose-Capillary: 94 mg/dL (ref 70–99)
Glucose-Capillary: 65 mg/dL — ABNORMAL LOW (ref 70–99)

## 2020-03-03 LAB — ETHANOL: Alcohol, Ethyl (B): <10 mg/dL (ref <10)

## 2020-03-03 LAB — APTT: aPTT: 32 seconds (ref 24–36)

## 2020-03-03 LAB — SAMPLE TO BLOOD BANK

## 2020-03-03 LAB — MRSA PCR SCREENING: MRSA by PCR: POSITIVE — AB

## 2020-03-03 LAB — CBG MONITORING, ED: Glucose-Capillary: 160 mg/dL — ABNORMAL HIGH (ref 70–99)

## 2020-03-03 LAB — LACTIC ACID, PLASMA: Lactic Acid, Venous: 2.8 mmol/L (ref 0.5–1.9)

## 2020-03-03 MED ORDER — DOCUSATE SODIUM 50 MG/5ML PO LIQD
100.0000 mg | Freq: Two times a day (BID) | ORAL | Status: DC
  Administered 2020-03-03 – 2020-03-12 (×16): 100 mg
  Filled 2020-03-03 (×15): qty 10

## 2020-03-03 MED ORDER — PANTOPRAZOLE SODIUM 40 MG IV SOLR
40.0000 mg | Freq: Every day | INTRAVENOUS | Status: DC
  Administered 2020-03-03 – 2020-03-05 (×3): 40 mg via INTRAVENOUS
  Filled 2020-03-03 (×3): qty 40

## 2020-03-03 MED ORDER — CHLORHEXIDINE GLUCONATE 0.12% ORAL RINSE (MEDLINE KIT)
15.0000 mL | Freq: Two times a day (BID) | OROMUCOSAL | Status: DC
  Administered 2020-03-03 – 2020-03-13 (×21): 15 mL via OROMUCOSAL

## 2020-03-03 MED ORDER — NOREPINEPHRINE 4 MG/250ML-% IV SOLN
2.0000 ug/min | INTRAVENOUS | Status: DC
  Administered 2020-03-03: 5 ug/min via INTRAVENOUS
  Administered 2020-03-03: 2 ug/min via INTRAVENOUS
  Administered 2020-03-04: 3 ug/min via INTRAVENOUS
  Administered 2020-03-05: 2 ug/min via INTRAVENOUS
  Administered 2020-03-06: 10 ug/min via INTRAVENOUS
  Filled 2020-03-03 (×4): qty 250

## 2020-03-03 MED ORDER — NOREPINEPHRINE 4 MG/250ML-% IV SOLN
0.0000 ug/min | INTRAVENOUS | Status: DC
  Administered 2020-03-03: 2 ug/min via INTRAVENOUS

## 2020-03-03 MED ORDER — MUPIROCIN 2 % EX OINT
1.0000 "application " | TOPICAL_OINTMENT | Freq: Two times a day (BID) | CUTANEOUS | Status: AC
  Administered 2020-03-03 – 2020-03-07 (×10): 1 via NASAL
  Filled 2020-03-03 (×2): qty 22

## 2020-03-03 MED ORDER — DEXTROSE 50 % IV SOLN
INTRAVENOUS | Status: AC
  Administered 2020-03-03: 12.5 g via INTRAVENOUS
  Filled 2020-03-03: qty 50

## 2020-03-03 MED ORDER — NOREPINEPHRINE 4 MG/250ML-% IV SOLN: 0.0000 ug/min | INTRAVENOUS | Status: DC

## 2020-03-03 MED ORDER — POLYETHYLENE GLYCOL 3350 17 G PO PACK: 17.0000 g | PACK | Freq: Every day | ORAL | Status: DC

## 2020-03-03 MED ORDER — SODIUM CHLORIDE 0.9 % IV SOLN: INTRAVENOUS | Status: DC

## 2020-03-03 MED ORDER — ENOXAPARIN SODIUM 30 MG/0.3ML ~~LOC~~ SOLN: 30.0000 mg | SUBCUTANEOUS | Status: DC

## 2020-03-03 MED ORDER — FENTANYL BOLUS VIA INFUSION
25.0000 ug | INTRAVENOUS | Status: DC | PRN
  Administered 2020-03-05: 25 ug via INTRAVENOUS
  Filled 2020-03-03: qty 25

## 2020-03-03 MED ORDER — ASPIRIN 300 MG RE SUPP
300.0000 mg | RECTAL | Status: AC
  Administered 2020-03-03: 300 mg via RECTAL
  Filled 2020-03-03: qty 1

## 2020-03-03 MED ORDER — NOREPINEPHRINE 4 MG/250ML-% IV SOLN
INTRAVENOUS | Status: AC
  Administered 2020-03-03: 15 ug/min via INTRAVENOUS
  Filled 2020-03-03: qty 250

## 2020-03-03 MED ORDER — HEPARIN SODIUM (PORCINE) 5000 UNIT/ML IJ SOLN
5000.0000 [IU] | Freq: Three times a day (TID) | INTRAMUSCULAR | Status: DC
  Administered 2020-03-03 – 2020-03-10 (×22): 5000 [IU] via SUBCUTANEOUS
  Filled 2020-03-03 (×21): qty 1

## 2020-03-03 MED ORDER — ONDANSETRON HCL 4 MG/2ML IJ SOLN: 4.0000 mg | Freq: Four times a day (QID) | INTRAMUSCULAR | Status: DC | PRN

## 2020-03-03 MED ORDER — POLYETHYLENE GLYCOL 3350 17 G PO PACK: 17.0000 g | PACK | Freq: Every day | ORAL | Status: DC | PRN

## 2020-03-03 MED ORDER — FENTANYL CITRATE (PF) 100 MCG/2ML IJ SOLN
25.0000 ug | Freq: Once | INTRAMUSCULAR | Status: AC
  Administered 2020-03-03: 25 ug via INTRAVENOUS
  Filled 2020-03-03: qty 2

## 2020-03-03 MED ORDER — DEXTROSE 10 % IV SOLN: INTRAVENOUS | Status: DC

## 2020-03-03 MED ORDER — ORAL CARE MOUTH RINSE
15.0000 mL | OROMUCOSAL | Status: DC
  Administered 2020-03-03 – 2020-03-13 (×97): 15 mL via OROMUCOSAL

## 2020-03-03 MED ORDER — FENTANYL 2500MCG IN NS 250ML (10MCG/ML) PREMIX INFUSION
0.0000 ug/h | INTRAVENOUS | Status: DC
  Administered 2020-03-03 (×2): 25 ug/h via INTRAVENOUS
  Administered 2020-03-05: 100 ug/h via INTRAVENOUS
  Administered 2020-03-06: 300 ug/h via INTRAVENOUS
  Administered 2020-03-06: 175 ug/h via INTRAVENOUS
  Administered 2020-03-06: 225 ug/h via INTRAVENOUS
  Administered 2020-03-07: 275 ug/h via INTRAVENOUS
  Administered 2020-03-07: 200 ug/h via INTRAVENOUS
  Administered 2020-03-08: 250 ug/h via INTRAVENOUS
  Administered 2020-03-08: 125 ug/h via INTRAVENOUS
  Filled 2020-03-03 (×10): qty 250

## 2020-03-03 MED ORDER — DEXTROSE 50 % IV SOLN: 12.5000 g | INTRAVENOUS | Status: AC

## 2020-03-03 MED ORDER — LACTATED RINGERS IV SOLN: INTRAVENOUS | Status: DC

## 2020-03-03 MED ORDER — SODIUM CHLORIDE 0.9 % IV SOLN
250.0000 mL | INTRAVENOUS | Status: DC
  Administered 2020-03-04 – 2020-03-24 (×3): 250 mL via INTRAVENOUS

## 2020-03-03 MED ORDER — DOCUSATE SODIUM 50 MG/5ML PO LIQD
100.0000 mg | Freq: Two times a day (BID) | ORAL | Status: DC
  Filled 2020-03-03: qty 10

## 2020-03-03 MED ORDER — INSULIN ASPART 100 UNIT/ML ~~LOC~~ SOLN
0.0000 [IU] | SUBCUTANEOUS | Status: DC
  Administered 2020-03-03: 3 [IU] via SUBCUTANEOUS
  Administered 2020-03-03: 6 [IU] via SUBCUTANEOUS
  Administered 2020-03-04 – 2020-03-06 (×4): 2 [IU] via SUBCUTANEOUS
  Administered 2020-03-06: 3 [IU] via SUBCUTANEOUS
  Administered 2020-03-06 (×2): 2 [IU] via SUBCUTANEOUS
  Administered 2020-03-06: 3 [IU] via SUBCUTANEOUS
  Administered 2020-03-06: 2 [IU] via SUBCUTANEOUS
  Administered 2020-03-07 (×6): 3 [IU] via SUBCUTANEOUS
  Administered 2020-03-08: 5 [IU] via SUBCUTANEOUS
  Administered 2020-03-08: 3 [IU] via SUBCUTANEOUS
  Administered 2020-03-08: 11 [IU] via SUBCUTANEOUS
  Administered 2020-03-08 (×2): 3 [IU] via SUBCUTANEOUS
  Administered 2020-03-09 (×3): 2 [IU] via SUBCUTANEOUS
  Administered 2020-03-09: 3 [IU] via SUBCUTANEOUS
  Administered 2020-03-09 – 2020-03-11 (×3): 2 [IU] via SUBCUTANEOUS
  Administered 2020-03-12: 3 [IU] via SUBCUTANEOUS
  Administered 2020-03-12 – 2020-03-13 (×3): 2 [IU] via SUBCUTANEOUS
  Administered 2020-03-15 – 2020-03-16 (×3): 3 [IU] via SUBCUTANEOUS
  Administered 2020-03-16 (×3): 2 [IU] via SUBCUTANEOUS
  Administered 2020-03-16: 3 [IU] via SUBCUTANEOUS
  Administered 2020-03-17: 2 [IU] via SUBCUTANEOUS
  Administered 2020-03-17 (×2): 3 [IU] via SUBCUTANEOUS
  Administered 2020-03-17 (×2): 2 [IU] via SUBCUTANEOUS
  Administered 2020-03-17 – 2020-03-18 (×2): 3 [IU] via SUBCUTANEOUS
  Administered 2020-03-18: 2 [IU] via SUBCUTANEOUS
  Administered 2020-03-18: 3 [IU] via SUBCUTANEOUS
  Administered 2020-03-18: 2 [IU] via SUBCUTANEOUS
  Administered 2020-03-18 – 2020-03-20 (×6): 3 [IU] via SUBCUTANEOUS
  Administered 2020-03-20: 5 [IU] via SUBCUTANEOUS
  Administered 2020-03-20 (×2): 3 [IU] via SUBCUTANEOUS
  Administered 2020-03-20: 5 [IU] via SUBCUTANEOUS
  Administered 2020-03-21 (×3): 3 [IU] via SUBCUTANEOUS
  Administered 2020-03-21: 5 [IU] via SUBCUTANEOUS
  Administered 2020-03-21 (×2): 3 [IU] via SUBCUTANEOUS
  Administered 2020-03-22: 2 [IU] via SUBCUTANEOUS
  Administered 2020-03-22: 5 [IU] via SUBCUTANEOUS
  Administered 2020-03-22 – 2020-03-23 (×8): 3 [IU] via SUBCUTANEOUS
  Administered 2020-03-23 (×2): 5 [IU] via SUBCUTANEOUS
  Administered 2020-03-24 (×6): 3 [IU] via SUBCUTANEOUS
  Administered 2020-03-25 (×4): 5 [IU] via SUBCUTANEOUS
  Administered 2020-03-25: 2 [IU] via SUBCUTANEOUS
  Administered 2020-03-25: 3 [IU] via SUBCUTANEOUS
  Administered 2020-03-25: 5 [IU] via SUBCUTANEOUS
  Administered 2020-03-26 (×2): 3 [IU] via SUBCUTANEOUS
  Administered 2020-03-26: 8 [IU] via SUBCUTANEOUS
  Administered 2020-03-26: 3 [IU] via SUBCUTANEOUS
  Administered 2020-03-26: 8 [IU] via SUBCUTANEOUS
  Administered 2020-03-27: 2 [IU] via SUBCUTANEOUS
  Administered 2020-03-27 (×2): 3 [IU] via SUBCUTANEOUS
  Administered 2020-03-27: 2 [IU] via SUBCUTANEOUS
  Administered 2020-03-27 (×2): 3 [IU] via SUBCUTANEOUS
  Administered 2020-03-28 (×2): 2 [IU] via SUBCUTANEOUS
  Administered 2020-03-28 (×4): 3 [IU] via SUBCUTANEOUS
  Administered 2020-03-29: 2 [IU] via SUBCUTANEOUS
  Administered 2020-03-29: 3 [IU] via SUBCUTANEOUS
  Administered 2020-03-29 (×2): 2 [IU] via SUBCUTANEOUS
  Administered 2020-03-29: 3 [IU] via SUBCUTANEOUS
  Administered 2020-03-29: 2 [IU] via SUBCUTANEOUS
  Administered 2020-03-30 (×2): 3 [IU] via SUBCUTANEOUS
  Administered 2020-03-30: 2 [IU] via SUBCUTANEOUS
  Administered 2020-03-30: 3 [IU] via SUBCUTANEOUS
  Administered 2020-03-30: 5 [IU] via SUBCUTANEOUS
  Administered 2020-03-30 – 2020-03-31 (×2): 3 [IU] via SUBCUTANEOUS
  Administered 2020-03-31: 5 [IU] via SUBCUTANEOUS
  Administered 2020-03-31 (×3): 3 [IU] via SUBCUTANEOUS
  Administered 2020-04-01 (×3): 2 [IU] via SUBCUTANEOUS
  Administered 2020-04-01 (×2): 3 [IU] via SUBCUTANEOUS
  Administered 2020-04-01 – 2020-04-02 (×3): 2 [IU] via SUBCUTANEOUS
  Administered 2020-04-02: 3 [IU] via SUBCUTANEOUS
  Administered 2020-04-02: 2 [IU] via SUBCUTANEOUS
  Administered 2020-04-03: 3 [IU] via SUBCUTANEOUS
  Administered 2020-04-03 (×3): 2 [IU] via SUBCUTANEOUS
  Administered 2020-04-03 – 2020-04-04 (×2): 3 [IU] via SUBCUTANEOUS
  Administered 2020-04-04 (×3): 2 [IU] via SUBCUTANEOUS
  Administered 2020-04-05: 0 [IU] via SUBCUTANEOUS
  Administered 2020-04-05 – 2020-04-06 (×6): 2 [IU] via SUBCUTANEOUS
  Administered 2020-04-06 (×2): 3 [IU] via SUBCUTANEOUS
  Administered 2020-04-06 – 2020-04-08 (×7): 2 [IU] via SUBCUTANEOUS
  Administered 2020-04-08: 3 [IU] via SUBCUTANEOUS
  Administered 2020-04-09 (×3): 2 [IU] via SUBCUTANEOUS
  Administered 2020-04-09: 3 [IU] via SUBCUTANEOUS
  Administered 2020-04-09 – 2020-04-10 (×4): 2 [IU] via SUBCUTANEOUS
  Administered 2020-04-10: 3 [IU] via SUBCUTANEOUS
  Administered 2020-04-10 (×2): 2 [IU] via SUBCUTANEOUS
  Administered 2020-04-10 – 2020-04-11 (×2): 3 [IU] via SUBCUTANEOUS
  Administered 2020-04-11: 2 [IU] via SUBCUTANEOUS

## 2020-03-03 MED ORDER — POLYETHYLENE GLYCOL 3350 17 G PO PACK
17.0000 g | PACK | Freq: Every day | ORAL | Status: DC
  Administered 2020-03-06 – 2020-03-11 (×5): 17 g
  Filled 2020-03-03 (×5): qty 1

## 2020-03-03 MED ORDER — PROPOFOL 1000 MG/100ML IV EMUL
0.0000 ug/kg/min | INTRAVENOUS | Status: DC
  Administered 2020-03-03: 15 ug/kg/min via INTRAVENOUS
  Administered 2020-03-03: 25 ug/kg/min via INTRAVENOUS
  Administered 2020-03-04: 20 ug/kg/min via INTRAVENOUS
  Filled 2020-03-03 (×3): qty 100

## 2020-03-03 MED ORDER — DOCUSATE SODIUM 100 MG PO CAPS: 100.0000 mg | ORAL_CAPSULE | Freq: Two times a day (BID) | ORAL | Status: DC | PRN

## 2020-03-03 MED ORDER — CHLORHEXIDINE GLUCONATE CLOTH 2 % EX PADS
6.0000 | MEDICATED_PAD | Freq: Every day | CUTANEOUS | Status: AC
  Administered 2020-03-04 – 2020-03-07 (×4): 6 via TOPICAL

## 2020-03-03 NOTE — Consult Note (Signed)
Reason for Consult:fall and cardiac arrest Referring Physician: [name] is an 74 y.o. female.  HPI:73yo F with a PMHx of COPD with chronic oxygen dependent respiratory failure, CHF, type 2 diabetes, hypertension, CKD stage III, and morbid obesity who has been having increasing respiratory failure recently according to her son.  She was just seen yesterday for follow-up at [facility].  Her son has been staying with her due to her declining health.  He heard a loud sound at around 4 AM and found her facedown in the bathroom he called 911.  She was found to be pulseless and he began CPR.  EMS continued CPR on arrival but got ROSC after about 15 minutes.  She was transported as a level 1 trauma due to the fall.  She lost pulses again in route.  She received 2-2 doses of epinephrine.  On arrival, CPR was in progress.  She was intubated expeditiously by the emergency department physician and had ROSC.  GCS 3.  History reviewed. No pertinent past medical history.  History reviewed. No pertinent surgical history.  No family history on file.  Social History:  has no history on file for tobacco use, alcohol use, and drug use.  Allergies: No Known Allergies  Medications: I have reviewed the patient's current medications.  Results for orders placed or performed during the hospital encounter of 03/03/20 (from the past 48 hour(s))  Comprehensive metabolic panel     Status: Abnormal   Collection Time: 03/03/20  5:24 AM  Result Value Ref Range   Sodium 144 135 - 145 mmol/L   Potassium 5.7 (H) 3.5 - 5.1 mmol/L   Chloride 101 98 - 111 mmol/L   CO2 32 22 - 32 mmol/L   Glucose, Bld 167 (H) 70 - 99 mg/dL    Comment: Glucose reference range applies only to samples taken after fasting for at least 8 hours.   BUN 37 (H) 8 - 23 mg/dL   Creatinine, Ser 2.08 (H) 0.44 - 1.00 mg/dL   Calcium 8.4 (L) 8.9 - 10.3 mg/dL   Total Protein 5.9 (L) 6.5 - 8.1 g/dL   Albumin 2.5 (L) 3.5 -  5.0 g/dL   AST 292 (H) 15 - 41 U/L   ALT 247 (H) 0 - 44 U/L   Alkaline Phosphatase 92 38 - 126 U/L   Total Bilirubin 0.9 0.3 - 1.2 mg/dL   GFR calc non Af Amer 23 (L) >60 mL/min   GFR calc Af Amer 27 (L) >60 mL/min   Anion gap 11 5 - 15    Comment: Performed at [facility] [address] [place], [place] [address]  CBC     Status: Abnormal   Collection Time: 03/03/20  5:24 AM  Result Value Ref Range   WBC 9.1 4.0 - 10.5 K/uL   RBC 2.81 (L) 3.87 - 5.11 MIL/uL   Hemoglobin 8.1 (L) 12.0 - 15.0 g/dL   HCT 30.7 (L) 36 - 46 %   MCV 109.3 (H) 80.0 - 100.0 fL   MCH 28.8 26.0 - 34.0 pg   MCHC 26.4 (L) 30.0 - 36.0 g/dL   RDW 13.5 11.5 - 15.5 %   Platelets 154 150 - 400 K/uL   nRBC 0.4 (H) 0.0 - 0.2 %    Comment: Performed at [facility], [place] [address] [place], [place] [address]  Protime-INR     Status: None   Collection Time: 03/03/20  5:24 AM  Result Value Ref Range   Prothrombin Time 14.5 11.4 - 15.2 seconds   INR 1.2 0.8 - 1.2    Comment: (NOTE) INR goal varies based on device and disease states. Performed at [facility], [place] [address], [place], [place] [address]   I-Stat Chem 8, ED     Status: Abnormal   Collection Time: 03/03/20  5:31 AM  Result Value Ref Range   Sodium 142 135 - 145 mmol/L   Potassium 5.3 (H) 3.5 - 5.1 mmol/L   Chloride 98 98 - 111 mmol/L   BUN 42 (H) 8 - 23 mg/dL   Creatinine, Ser 1.90 (H) 0.44 - 1.00 mg/dL   Glucose, Bld 162 (H) 70 - 99 mg/dL    Comment: Glucose reference range applies only to samples taken after fasting for at least 8 hours.   Calcium, Ion 1.03 (L) 1.15 - 1.40 mmol/L   TCO2 35 (H) 22 - 32 mmol/L   Hemoglobin 9.5 (L) 12.0 - 15.0 g/dL   HCT 28.0 (L) 36 - 46 %  Sample to Blood Bank     Status: None   Collection Time: 03/03/20  5:38 AM  Result Value Ref Range   Blood Bank Specimen SAMPLE AVAILABLE FOR TESTING    Sample Expiration      03/04/2020,2359 Performed at [facility], [place] [address],  [place],  [address]     CT HEAD WO CONTRAST  Result Date: 03/03/2020 CLINICAL DATA:  Trauma.  Fall with head trauma.  Status post CPR. EXAM: CT HEAD WITHOUT CONTRAST CT MAXILLOFACIAL WITHOUT CONTRAST CT CERVICAL SPINE WITHOUT CONTRAST TECHNIQUE: Multidetector CT imaging of the head, cervical spine, and maxillofacial structures were performed using the standard protocol without intravenous contrast. Multiplanar CT image reconstructions of the cervical spine and maxillofacial structures were also generated. COMPARISON:  10/18/2018 FINDINGS: CT HEAD FINDINGS Brain: There is no mass, hemorrhage or extra-axial collection. The size and configuration of the ventricles and extra-axial CSF spaces are normal. The brain parenchyma is normal, without evidence of acute or chronic infarction. Vascular: No abnormal hyperdensity of the major intracranial arteries or dural venous sinuses. No intracranial atherosclerosis. Skull: Small left frontal scalp hematoma.  No skull fracture. CT MAXILLOFACIAL FINDINGS Osseous: --Complex facial fracture types: No LeFort, zygomaticomaxillary complex or nasoorbitoethmoidal fracture. --Simple fracture types: There is a fracture of the left lamina papyracea with herniation of the left medial rectus muscle into the ethmoid sinus. --Mandible: No fracture or dislocation. Orbits: There is gas within the left orbit. There is a small amount of retro bulbar stranding, possibly hemorrhage. Sinuses: Left ethmoid sinus is opacified, partially with herniated left orbital contents. Soft tissues: Normal visualized extracranial soft tissues. CT CERVICAL SPINE FINDINGS Alignment: No static subluxation. Facets are aligned. Occipital condyles and the lateral masses of C1-C2 are aligned. Skull base and vertebrae: No acute fracture. Soft tissues and spinal canal: No prevertebral fluid or swelling. No visible canal hematoma. Disc levels: No advanced spinal canal or neural foraminal stenosis. Upper chest: Large  area of consolidation at the right lung apex. Other: Normal visualized paraspinal cervical soft tissues. IMPRESSION: 1. No acute intracranial abnormality. 2. No acute fracture or static subluxation of the cervical spine. 3. Small left frontal scalp hematoma without skull fracture. 4. Fracture of the left lamina papyracea with herniation of the left medial rectus muscle into the ethmoid sinus. Small amount of retro bulbar stranding, likely hemorrhage. 5. Right lung apex consolidation concerning for pneumonia. Electronically Signed   By:  Ulyses Jarred M.D.   On: 03/03/2020 06:18   CT Cervical Spine Wo Contrast  Result Date: 03/03/2020 CLINICAL DATA:  Trauma.  Fall with head trauma.  Status post CPR. EXAM: CT HEAD WITHOUT CONTRAST CT MAXILLOFACIAL WITHOUT CONTRAST CT CERVICAL SPINE WITHOUT CONTRAST TECHNIQUE: Multidetector CT imaging of the head, cervical spine, and maxillofacial structures were performed using the standard protocol without intravenous contrast. Multiplanar CT image reconstructions of the cervical spine and maxillofacial structures were also generated. COMPARISON:  10/18/2018 FINDINGS: CT HEAD FINDINGS Brain: There is no mass, hemorrhage or extra-axial collection. The size and configuration of the ventricles and extra-axial CSF spaces are normal. The brain parenchyma is normal, without evidence of acute or chronic infarction. Vascular: No abnormal hyperdensity of the major intracranial arteries or dural venous sinuses. No intracranial atherosclerosis. Skull: Small left frontal scalp hematoma.  No skull fracture. CT MAXILLOFACIAL FINDINGS Osseous: --Complex facial fracture types: No LeFort, zygomaticomaxillary complex or nasoorbitoethmoidal fracture. --Simple fracture types: There is a fracture of the left lamina papyracea with herniation of the left medial rectus muscle into the ethmoid sinus. --Mandible: No fracture or dislocation. Orbits: There is gas within the left orbit. There is a small  amount of retro bulbar stranding, possibly hemorrhage. Sinuses: Left ethmoid sinus is opacified, partially with herniated left orbital contents. Soft tissues: Normal visualized extracranial soft tissues. CT CERVICAL SPINE FINDINGS Alignment: No static subluxation. Facets are aligned. Occipital condyles and the lateral masses of C1-C2 are aligned. Skull base and vertebrae: No acute fracture. Soft tissues and spinal canal: No prevertebral fluid or swelling. No visible canal hematoma. Disc levels: No advanced spinal canal or neural foraminal stenosis. Upper chest: Large area of consolidation at the right lung apex. Other: Normal visualized paraspinal cervical soft tissues. IMPRESSION: 1. No acute intracranial abnormality. 2. No acute fracture or static subluxation of the cervical spine. 3. Small left frontal scalp hematoma without skull fracture. 4. Fracture of the left lamina papyracea with herniation of the left medial rectus muscle into the ethmoid sinus. Small amount of retro bulbar stranding, likely hemorrhage. 5. Right lung apex consolidation concerning for pneumonia. Electronically Signed   By: Ulyses Jarred M.D.   On: 03/03/2020 06:18   DG Pelvis Portable  Result Date: 03/03/2020 CLINICAL DATA:  Fall EXAM: PORTABLE PELVIS 1-2 VIEWS COMPARISON:  None. FINDINGS: Limited visibility of the left hip. There is no evidence of pelvic fracture or diastasis. No pelvic bone lesions are seen. IMPRESSION: Negative. Electronically Signed   By: Ulyses Jarred M.D.   On: 03/03/2020 05:46   DG Chest Port 1 View  Result Date: 03/03/2020 CLINICAL DATA:  Fall with head trauma EXAM: PORTABLE CHEST 1 VIEW COMPARISON:  None. FINDINGS: Bilateral upper lobe predominant opacities. Otherwise limited information due to patient being image while on a backboard. The carina is not adequately visualized. The endotracheal tube tip may be approaching the right mainstem bronchus. IMPRESSION: 1. Endotracheal tube tip may be approaching the  right mainstem bronchus. Retraction by 2 cm recommended. 2. Bilateral upper lobe predominant opacities. Electronically Signed   By: Ulyses Jarred M.D.   On: 03/03/2020 05:45   CT Maxillofacial Wo Contrast  Result Date: 03/03/2020 CLINICAL DATA:  Trauma.  Fall with head trauma.  Status post CPR. EXAM: CT HEAD WITHOUT CONTRAST CT MAXILLOFACIAL WITHOUT CONTRAST CT CERVICAL SPINE WITHOUT CONTRAST TECHNIQUE: Multidetector CT imaging of the head, cervical spine, and maxillofacial structures were performed using the standard protocol without intravenous contrast. Multiplanar CT image reconstructions of the cervical  spine and maxillofacial structures were also generated. COMPARISON:  10/18/2018 FINDINGS: CT HEAD FINDINGS Brain: There is no mass, hemorrhage or extra-axial collection. The size and configuration of the ventricles and extra-axial CSF spaces are normal. The brain parenchyma is normal, without evidence of acute or chronic infarction. Vascular: No abnormal hyperdensity of the major intracranial arteries or dural venous sinuses. No intracranial atherosclerosis. Skull: Small left frontal scalp hematoma.  No skull fracture. CT MAXILLOFACIAL FINDINGS Osseous: --Complex facial fracture types: No LeFort, zygomaticomaxillary complex or nasoorbitoethmoidal fracture. --Simple fracture types: There is a fracture of the left lamina papyracea with herniation of the left medial rectus muscle into the ethmoid sinus. --Mandible: No fracture or dislocation. Orbits: There is gas within the left orbit. There is a small amount of retro bulbar stranding, possibly hemorrhage. Sinuses: Left ethmoid sinus is opacified, partially with herniated left orbital contents. Soft tissues: Normal visualized extracranial soft tissues. CT CERVICAL SPINE FINDINGS Alignment: No static subluxation. Facets are aligned. Occipital condyles and the lateral masses of C1-C2 are aligned. Skull base and vertebrae: No acute fracture. Soft tissues and  spinal canal: No prevertebral fluid or swelling. No visible canal hematoma. Disc levels: No advanced spinal canal or neural foraminal stenosis. Upper chest: Large area of consolidation at the right lung apex. Other: Normal visualized paraspinal cervical soft tissues. IMPRESSION: 1. No acute intracranial abnormality. 2. No acute fracture or static subluxation of the cervical spine. 3. Small left frontal scalp hematoma without skull fracture. 4. Fracture of the left lamina papyracea with herniation of the left medial rectus muscle into the ethmoid sinus. Small amount of retro bulbar stranding, likely hemorrhage. 5. Right lung apex consolidation concerning for pneumonia. Electronically Signed   By: Ulyses Jarred M.D.   On: 03/03/2020 06:18    Review of Systems  Unable to perform ROS: Intubated   Blood pressure (!) 131/57, pulse (!) 56, temperature (!) 95 F (35 C), resp. rate 13, height 5\' 8"  (1.727 m), weight (!) 145.2 kg, SpO2 100 %. Physical Exam Constitutional:      Appearance: She is obese. She is ill-appearing.  HENT:     Head:     Comments: Very large hematoma anteriorly on her forehead with 2 small abrasions and some ooze    Nose:     Comments: Some oozing epistaxis    Mouth/Throat:     Mouth: Mucous membranes are moist.  Eyes:     General: No scleral icterus.    Comments: Pupils 5 mm and sluggish Unable to assess EOM  Cardiovascular:     Comments: Heart rate 50s Heart sounds very distant due to body habitus Pulmonary:     Breath sounds: Rhonchi present.     Comments: Rhonchi bilaterally after intubation Abdominal:     General: There is no distension.     Palpations: Abdomen is soft.     Tenderness: There is no abdominal tenderness. There is no guarding.  Musculoskeletal:     Cervical back: No rigidity.     Left lower leg: Edema present.  Skin:    General: Skin is dry.     Capillary Refill: Capillary refill takes more than 3 seconds.  Neurological:     Comments: GCS 3      Assessment/Plan: Fall with cardiac arrest - now bradycardic on levophed L medial orbital wall FX Forehead hematoma Significant pulmonary edema vs PNA  Suspect cardiac event preceded fall.  In light of history of worsening oxygen dependent respiratory failure as well recommend admission by  CCM for care post-arrest with facial trauma consult.  Trauma is available as needed.    Zenovia Jarred 03/03/2020, 6:31 AM

## 2020-03-03 NOTE — Progress Notes (Signed)
Pt started clenching down her jaw and blood was pouring out of her mouth. Pt had bitten her tongue, unable to place bite block as she is so clenched. Vent dyssynchrony became an issue as well, and propofol drip was started. Pt  Has not been responsive since she arrived at 1030 am on this unit. I have witnessed patient chewing on the ETT  Intermittently for short periods of time , no cough or gag on 25 of fentanyl . Continue to monitor Kimberly Howell

## 2020-03-03 NOTE — H&P (Addendum)
NAME:  Kimberly Howell, MRN:  [id number], DOB:  February 10, 1946, LOS: 0 ADMISSION DATE:  03/03/2020, CONSULTATION DATE:  03/03/20 REFERRING MD:  EDP, CHIEF COMPLAINT:  Cardiac arrest   Brief History   74 year old female with past medical history of chronic respiratory failure with COPD on 3 L home O2 and CPAP nightly, stage IV CKD, hypertension, morbid obesity who had a cardiac arrest with >45 minutes CPR before ROSC then lost pulses three more times.  Intubated and PCCM consulted for admission  History of present illness   Kimberly Howell is a 74 year old female with past medical history significant for chronic respiratory failure with COPD on 3 L home O2 and CPAP nightly, stage IV CKD, hypertension, morbid obesity.  Her son lives with her and states she had resumed her CPAP last night, he was up in the night with her around 1 AM and she seemed fine.  Several hours later he heard the sound of her falling in the bathroom.  He found her face down and unresponsive and did not think she was breathing so called 911 and started chest compressions.  When EMS arrived CPR performed for 45-50 minutes before ROSC achieved.  Per report, patient lost pulses 3 more times and ended to the ED with CPR in progress.    CT head negative for intracranial bleed, L lamina papyracea fracture with herniation of the L medial rectus muscle.  Labs significant for creatinine 2.08, elevated LFTs AST 392, ALT 247.  She has been unresponsive in the ED with sedation. PCCM consulted for admission  Past Medical History  Morbid obesity with BMI of 50.0-59.9, adult ([place])   Chronic respiratory failure (HCC)   CKD (chronic kidney disease) stage 3, GFR 30-59 ml/min (HCC)   Essential hypertension   OSA on CPAP  Significant Hospital Events   9/18 Admit to PCCM  Consults:    Procedures:  9/18 ETT  Significant Diagnostic Tests:  CT head/C-spine/Maxillofacial>>Small left frontal scalp hematoma without skull fracture. Fracture of the  left lamina papyracea with herniation of the left medial rectus muscle into the ethmoid sinus. Small amount of retro bulbar stranding, likely hemorrhage.  Right lung apex consolidation concerning for pneumonia.  Micro Data:  9/18 Sars-CoV-2>>negative  Antimicrobials:     Interim history/subjective:  As above  Objective   Blood pressure (!) 131/57, pulse (!) 56, temperature (!) 95 F (35 C), resp. rate 13, height 5\' 8"  (1.727 m), weight (!) 145.2 kg, SpO2 100 %.    Vent Mode: PRVC FiO2 (%):  [100 %] 100 % Set Rate:  [18 bmp] 18 bmp Vt Set:  [510 mL] 510 mL PEEP:  [5 cmH20] 5 cmH20 Plateau Pressure:  [31 cmH20] 31 cmH20   Intake/Output Summary (Last 24 hours) at 03/03/2020 5170 Last data filed at 03/03/2020 0534 Gross per 24 hour  Intake 200 ml  Output 0 ml  Net 200 ml   Filed Weights   03/03/20 0530  Weight: (!) 145.2 kg    General: Obese female, intubated and unresponsive HEENT: MM pink/moist, ETT in place, laceration of the right frontal forehead with edges well approximated, left eye with periorbital ecchymosis and hyphema Neuro: Unresponsive without sedation, not breathing over the vent, slight withdrawal to pain in the LLE, no posturing or seizure-like activity  CV: s1s2 rrr, no m/r/g PULM: Expiratory wheezing throughout GI: soft, bsx4 active  Extremities: warm/dry, 1+ edema  Skin: no rashes or lesions   Resolved Hospital Problem list     Assessment &  Plan:    Cardiac arrest with hypoxic and hypercarbic respiratory failure EKG without signs of ischemia.  Patient has been unsteady on her feet recently, unclear inciting event for multiple arrests --Maintain full vent support with SAT/SBT as tolerated -titrate Vent setting to maintain SpO2 greater than or equal to 90%. -HOB elevated 30 degrees. -Plateau pressures less than 30 cm H20.  -Follow chest x-ray, ABG prn.   -Bronchial hygiene and RT/bronchodilator protocol. -Echo, EEG -Cool with target 37  degrees -On peripheral Levophed with MAP's in the 31s, may need CVC -Likely poor prognosis, have discussed with patient's son who wants to continue aggressive measures for now   Periorbital fracture with hyphema Herniation of the left medial rectus muscle into the sinus -Depending on course patient may need ophthalmology consult   Possible upper lobe pneumonia No recent symptoms per son, no leukocytosis or fever -May have aspirated, consider initiating antibiotic therapy   Type 2 diabetes -SSI, hemoglobin A1c   CKD Creatinine near baseline of 1.7-1.8 (patient has duplicate MRN)   HFpEF, hypertension Son felt as if she was retaining fluid, on Lasix 20 mg daily, last echo in 2019 with EF 60-65% -Repeat echo,may need Lasix -Hold home antihypertensives  Best practice:  Diet: N.p.o.  pain/Anxiety/Delirium protocol (if indicated): As needed fentanyl VAP protocol (if indicated): HOB 30 degrees, suction as needed DVT prophylaxis: SCDs, heparin GI prophylaxis: Protonix Glucose control: SSI Mobility: Bedrest Code Status: Full code Family Communication: Son updated at the bedside Disposition: ICU  Labs   CBC: Recent Labs  Lab 03/03/20 0524 03/03/20 0531  WBC 9.1  --   HGB 8.1* 9.5*  HCT 30.7* 28.0*  MCV 109.3*  --   PLT 154  --     Basic Metabolic Panel: Recent Labs  Lab 03/03/20 0524 03/03/20 0531  NA 144 142  K 5.7* 5.3*  CL 101 98  CO2 32  --   GLUCOSE 167* 162*  BUN 37* 42*  CREATININE 2.08* 1.90*  CALCIUM 8.4*  --    GFR: Estimated Creatinine Clearance: 41.3 mL/min (A) (by C-G formula based on SCr of 1.9 mg/dL (H)). Recent Labs  Lab 03/03/20 0524  WBC 9.1  LATICACIDVEN 2.8*    Liver Function Tests: Recent Labs  Lab 03/03/20 0524  AST 292*  ALT 247*  ALKPHOS 92  BILITOT 0.9  PROT 5.9*  ALBUMIN 2.5*   No results for input(s): LIPASE, AMYLASE in the last 168 hours. No results for input(s): AMMONIA in the last 168 hours.  ABG     Component Value Date/Time   TCO2 35 (H) 03/03/2020 0531     Coagulation Profile: Recent Labs  Lab 03/03/20 0524  INR 1.2    Cardiac Enzymes: No results for input(s): CKTOTAL, CKMB, CKMBINDEX, TROPONINI in the last 168 hours.  HbA1C: No results found for: HGBA1C  CBG: No results for input(s): GLUCAP in the last 168 hours.  Review of Systems:   Unable to obtain  Past Medical History  She,  has no past medical history on file.   Surgical History   History reviewed. No pertinent surgical history.   Social History      Family History   Her family history is not on file.   Allergies No Known Allergies   Home Medications  Prior to Admission medications   Not on File     Critical care time: 55 minutes     CRITICAL CARE Performed by: Otilio Carpen Gleason   Total critical care time: 20  minutes  Critical care time was exclusive of separately billable procedures and treating other patients.  Critical care was necessary to treat or prevent imminent or life-threatening deterioration.  Critical care was time spent personally by me on the following activities: development of treatment plan with patient and/or surrogate as well as nursing, discussions with consultants, evaluation of patient's response to treatment, examination of patient, obtaining history from patient or surrogate, ordering and performing treatments and interventions, ordering and review of laboratory studies, ordering and review of radiographic studies, pulse oximetry and re-evaluation of patient's condition.   Otilio Carpen Gleason, PA-C   PCCM:  74 yo FM, Cardiac arrest at home. I called and spoke with the patients son who was present at the house and heard his mother fall to the floor in the middle of the night. She was found in the bathroom. He called 911 and started CPR.   She had extensive amount of down time. ROSC was obtained. ~54mins down. Lost pulse 3 additional times per documentation on the way  to the hospital.   Multiple arrest states since early morning. The patient was intubated placed on MV and started on euthermia protocol.   BP (!) 106/58   Pulse 61   Temp 99 F (37.2 C) (Bladder)   Resp 19   Ht 5\' 4"  (1.626 m)   Wt (!) 145.2 kg   SpO2 99%   BMI 54.93 kg/m   Gen: obese fm, on life suppor t HENT: facial trauma, black eye, swollen, known fracture from fall Heart: RRR s1 s2 Lungs: BL vented breaths   Labs reviewed   A:  Cardiac Arrest  AHRF on MV secondary to above  Multifactorial, obese, HTN CKD, with with multiple medical reasons for a cardiac event at home OHS OSA at baseline   P:  euthermia protocol  Pads in place  Remains on vent   eeg pending  Echo pending  Support care post arrest  Not requiring pressors anymore  I had a long discussion on phone with both sons. They are aware of her prognosis.   This patient is critically ill with multiple organ system failure; which, requires frequent high complexity decision making, assessment, support, evaluation, and titration of therapies. This was completed through the application of advanced monitoring technologies and extensive interpretation of multiple databases. During this encounter critical care time was devoted to patient care services described in this note for 42 minutes.  Garner Nash, DO [facility] 03/03/2020 1:43 PM

## 2020-03-03 NOTE — Progress Notes (Signed)
ETT retracted 2cm. ETT now positioned 24@ lip.

## 2020-03-03 NOTE — ED Notes (Signed)
Ice packs applied

## 2020-03-03 NOTE — Progress Notes (Signed)
  Echocardiogram 2D Echocardiogram has been performed.  Kimberly Howell G Leiland Mihelich 03/03/2020, 3:11 PM

## 2020-03-03 NOTE — Progress Notes (Signed)
   03/03/20 0515  Clinical Encounter Type  Visited With Family;Patient  Visit Type Code  Referral From  (Level 1 Page )  Consult/Referral To Chaplain  Spiritual Encounters  Spiritual Needs Emotional;Prayer  Chaplain responded to page. Son was enroute to ED and greeting him in the ED waiting room.  Escorted him to [facility] to see his mother.  While Pt was in CT escorted Pt son to Consult Room A.  Prayer and emotional support given.  Awaiting his brother to arrive and will greet him in ED waiting room.  Chaplain Kiven Vangilder Morgan-Simpson

## 2020-03-03 NOTE — ED Notes (Addendum)
Pt comes via [facility] from home for a fall, facial trauma, pt treated for pulmonary edema recently, pt fell and has large hematoma to L forehead with small laceration, found down a pulseless at 400am in asystole upon EMS arrival , ROSC at 446, pulses lost again and then ROSC again at unknown time, pt came in as CPR in progress, compressions were restarted at 504. PTA received 2 epi

## 2020-03-03 NOTE — ED Notes (Signed)
Pulse check, pulses present, NSR

## 2020-03-03 NOTE — Progress Notes (Signed)
  Ventilator patient transported from ED Trauma B to [place] [facility] without any complications.

## 2020-03-03 NOTE — ED Provider Notes (Addendum)
[facility] EMERGENCY DEPARTMENT Provider Note   CSN: [id number] Arrival date & time: 03/03/20  7829     History Chief Complaint  Patient presents with  . Fall    Kimberly Howell is a 74 y.o. female.  Patient received via EMS from home as a level 1 trauma after a fall. Patient is a 74 year old female with a history of significant obstructive sleep apnea who was heard to fall in the bathroom by family tonight around 4 AM. She was found facedown with significant facial trauma. Family initiated CPR which was continued by EMS. She was given epi during CPR and regained spontaneous circulation. She had been ventilated via King's airway during transport. EMS report that they lost the King's airway when they arrived at the hospital and she became pulseless again, CPR was reinitiated. Level 5 caveat due to acuity of condition.        History reviewed. No pertinent past medical history.  Patient Active Problem List   Diagnosis Date Noted  . Acute respiratory failure ([place]) 03/03/2020    History reviewed. No pertinent surgical history.   OB History   No obstetric history on file.     No family history on file.  Social History   Tobacco Use  . Smoking status: Not on file  Substance Use Topics  . Alcohol use: Not on file  . Drug use: Not on file    Home Medications Prior to Admission medications   Not on File    Allergies    Patient has no known allergies.  Review of Systems   Review of Systems  Unable to perform ROS: Acuity of condition    Physical Exam Updated Vital Signs BP 125/61   Pulse (!) 56   Temp (!) 95 F (35 C)   Resp 18   Ht 5\' 8"  (1.727 m)   Wt (!) 145.2 kg   SpO2 100%   BMI 48.66 kg/m   Physical Exam Vitals and nursing note reviewed.  Constitutional:      Appearance: She is ill-appearing.  HENT:     Head: Contusion (forehead) present.     Nose:     Right Nostril: Epistaxis present.  Eyes:     Comments: Pupils 2  bilat, sluggish   Cardiovascular:     Comments: No cardiac activity Pulmonary:     Comments: Rales bilat with bag-valve-mask Abdominal:     Palpations: Abdomen is soft.  Musculoskeletal:        General: No deformity.     Cervical back: Neck supple.  Neurological:     Comments: Unresponsive      ED Results / Procedures / Treatments   Labs (all labs ordered are listed, but only abnormal results are displayed) Labs Reviewed  COMPREHENSIVE METABOLIC PANEL - Abnormal; Notable for the following components:      Result Value   Potassium 5.7 (*)    Glucose, Bld 167 (*)    BUN 37 (*)    Creatinine, Ser 2.08 (*)    Calcium 8.4 (*)    Total Protein 5.9 (*)    Albumin 2.5 (*)    AST 292 (*)    ALT 247 (*)    GFR calc non Af Amer 23 (*)    GFR calc Af Amer 27 (*)    All other components within normal limits  CBC - Abnormal; Notable for the following components:   RBC 2.81 (*)    Hemoglobin 8.1 (*)  HCT 30.7 (*)    MCV 109.3 (*)    MCHC 26.4 (*)    nRBC 0.4 (*)    All other components within normal limits  LACTIC ACID, PLASMA - Abnormal; Notable for the following components:   Lactic Acid, Venous 2.8 (*)    All other components within normal limits  I-STAT CHEM 8, ED - Abnormal; Notable for the following components:   Potassium 5.3 (*)    BUN 42 (*)    Creatinine, Ser 1.90 (*)    Glucose, Bld 162 (*)    Calcium, Ion 1.03 (*)    TCO2 35 (*)    Hemoglobin 9.5 (*)    HCT 28.0 (*)    All other components within normal limits  SARS CORONAVIRUS 2 BY RT PCR (HOSPITAL ORDER, [facility])  ETHANOL  PROTIME-INR  URINALYSIS, ROUTINE W REFLEX MICROSCOPIC  BASIC METABOLIC PANEL  BASIC METABOLIC PANEL  PROTIME-INR  APTT  CBC  SAMPLE TO BLOOD BANK    EKG None  Radiology CT HEAD WO CONTRAST  Result Date: 03/03/2020 CLINICAL DATA:  Trauma.  Fall with head trauma.  Status post CPR. EXAM: CT HEAD WITHOUT CONTRAST CT MAXILLOFACIAL WITHOUT CONTRAST  CT CERVICAL SPINE WITHOUT CONTRAST TECHNIQUE: Multidetector CT imaging of the head, cervical spine, and maxillofacial structures were performed using the standard protocol without intravenous contrast. Multiplanar CT image reconstructions of the cervical spine and maxillofacial structures were also generated. COMPARISON:  10/18/2018 FINDINGS: CT HEAD FINDINGS Brain: There is no mass, hemorrhage or extra-axial collection. The size and configuration of the ventricles and extra-axial CSF spaces are normal. The brain parenchyma is normal, without evidence of acute or chronic infarction. Vascular: No abnormal hyperdensity of the major intracranial arteries or dural venous sinuses. No intracranial atherosclerosis. Skull: Small left frontal scalp hematoma.  No skull fracture. CT MAXILLOFACIAL FINDINGS Osseous: --Complex facial fracture types: No LeFort, zygomaticomaxillary complex or nasoorbitoethmoidal fracture. --Simple fracture types: There is a fracture of the left lamina papyracea with herniation of the left medial rectus muscle into the ethmoid sinus. --Mandible: No fracture or dislocation. Orbits: There is gas within the left orbit. There is a small amount of retro bulbar stranding, possibly hemorrhage. Sinuses: Left ethmoid sinus is opacified, partially with herniated left orbital contents. Soft tissues: Normal visualized extracranial soft tissues. CT CERVICAL SPINE FINDINGS Alignment: No static subluxation. Facets are aligned. Occipital condyles and the lateral masses of C1-C2 are aligned. Skull base and vertebrae: No acute fracture. Soft tissues and spinal canal: No prevertebral fluid or swelling. No visible canal hematoma. Disc levels: No advanced spinal canal or neural foraminal stenosis. Upper chest: Large area of consolidation at the right lung apex. Other: Normal visualized paraspinal cervical soft tissues. IMPRESSION: 1. No acute intracranial abnormality. 2. No acute fracture or static subluxation of the  cervical spine. 3. Small left frontal scalp hematoma without skull fracture. 4. Fracture of the left lamina papyracea with herniation of the left medial rectus muscle into the ethmoid sinus. Small amount of retro bulbar stranding, likely hemorrhage. 5. Right lung apex consolidation concerning for pneumonia. Electronically Signed   By: Ulyses Jarred M.D.   On: 03/03/2020 06:18   CT Cervical Spine Wo Contrast  Result Date: 03/03/2020 CLINICAL DATA:  Trauma.  Fall with head trauma.  Status post CPR. EXAM: CT HEAD WITHOUT CONTRAST CT MAXILLOFACIAL WITHOUT CONTRAST CT CERVICAL SPINE WITHOUT CONTRAST TECHNIQUE: Multidetector CT imaging of the head, cervical spine, and maxillofacial structures were performed using the standard  protocol without intravenous contrast. Multiplanar CT image reconstructions of the cervical spine and maxillofacial structures were also generated. COMPARISON:  10/18/2018 FINDINGS: CT HEAD FINDINGS Brain: There is no mass, hemorrhage or extra-axial collection. The size and configuration of the ventricles and extra-axial CSF spaces are normal. The brain parenchyma is normal, without evidence of acute or chronic infarction. Vascular: No abnormal hyperdensity of the major intracranial arteries or dural venous sinuses. No intracranial atherosclerosis. Skull: Small left frontal scalp hematoma.  No skull fracture. CT MAXILLOFACIAL FINDINGS Osseous: --Complex facial fracture types: No LeFort, zygomaticomaxillary complex or nasoorbitoethmoidal fracture. --Simple fracture types: There is a fracture of the left lamina papyracea with herniation of the left medial rectus muscle into the ethmoid sinus. --Mandible: No fracture or dislocation. Orbits: There is gas within the left orbit. There is a small amount of retro bulbar stranding, possibly hemorrhage. Sinuses: Left ethmoid sinus is opacified, partially with herniated left orbital contents. Soft tissues: Normal visualized extracranial soft tissues. CT  CERVICAL SPINE FINDINGS Alignment: No static subluxation. Facets are aligned. Occipital condyles and the lateral masses of C1-C2 are aligned. Skull base and vertebrae: No acute fracture. Soft tissues and spinal canal: No prevertebral fluid or swelling. No visible canal hematoma. Disc levels: No advanced spinal canal or neural foraminal stenosis. Upper chest: Large area of consolidation at the right lung apex. Other: Normal visualized paraspinal cervical soft tissues. IMPRESSION: 1. No acute intracranial abnormality. 2. No acute fracture or static subluxation of the cervical spine. 3. Small left frontal scalp hematoma without skull fracture. 4. Fracture of the left lamina papyracea with herniation of the left medial rectus muscle into the ethmoid sinus. Small amount of retro bulbar stranding, likely hemorrhage. 5. Right lung apex consolidation concerning for pneumonia. Electronically Signed   By: Ulyses Jarred M.D.   On: 03/03/2020 06:18   DG Pelvis Portable  Result Date: 03/03/2020 CLINICAL DATA:  Fall EXAM: PORTABLE PELVIS 1-2 VIEWS COMPARISON:  None. FINDINGS: Limited visibility of the left hip. There is no evidence of pelvic fracture or diastasis. No pelvic bone lesions are seen. IMPRESSION: Negative. Electronically Signed   By: Ulyses Jarred M.D.   On: 03/03/2020 05:46   DG Chest Port 1 View  Result Date: 03/03/2020 CLINICAL DATA:  Fall with head trauma EXAM: PORTABLE CHEST 1 VIEW COMPARISON:  None. FINDINGS: Bilateral upper lobe predominant opacities. Otherwise limited information due to patient being image while on a backboard. The carina is not adequately visualized. The endotracheal tube tip may be approaching the right mainstem bronchus. IMPRESSION: 1. Endotracheal tube tip may be approaching the right mainstem bronchus. Retraction by 2 cm recommended. 2. Bilateral upper lobe predominant opacities. Electronically Signed   By: Ulyses Jarred M.D.   On: 03/03/2020 05:45   CT Maxillofacial Wo  Contrast  Result Date: 03/03/2020 CLINICAL DATA:  Trauma.  Fall with head trauma.  Status post CPR. EXAM: CT HEAD WITHOUT CONTRAST CT MAXILLOFACIAL WITHOUT CONTRAST CT CERVICAL SPINE WITHOUT CONTRAST TECHNIQUE: Multidetector CT imaging of the head, cervical spine, and maxillofacial structures were performed using the standard protocol without intravenous contrast. Multiplanar CT image reconstructions of the cervical spine and maxillofacial structures were also generated. COMPARISON:  10/18/2018 FINDINGS: CT HEAD FINDINGS Brain: There is no mass, hemorrhage or extra-axial collection. The size and configuration of the ventricles and extra-axial CSF spaces are normal. The brain parenchyma is normal, without evidence of acute or chronic infarction. Vascular: No abnormal hyperdensity of the major intracranial arteries or dural venous sinuses. No intracranial atherosclerosis.  Skull: Small left frontal scalp hematoma.  No skull fracture. CT MAXILLOFACIAL FINDINGS Osseous: --Complex facial fracture types: No LeFort, zygomaticomaxillary complex or nasoorbitoethmoidal fracture. --Simple fracture types: There is a fracture of the left lamina papyracea with herniation of the left medial rectus muscle into the ethmoid sinus. --Mandible: No fracture or dislocation. Orbits: There is gas within the left orbit. There is a small amount of retro bulbar stranding, possibly hemorrhage. Sinuses: Left ethmoid sinus is opacified, partially with herniated left orbital contents. Soft tissues: Normal visualized extracranial soft tissues. CT CERVICAL SPINE FINDINGS Alignment: No static subluxation. Facets are aligned. Occipital condyles and the lateral masses of C1-C2 are aligned. Skull base and vertebrae: No acute fracture. Soft tissues and spinal canal: No prevertebral fluid or swelling. No visible canal hematoma. Disc levels: No advanced spinal canal or neural foraminal stenosis. Upper chest: Large area of consolidation at the right lung  apex. Other: Normal visualized paraspinal cervical soft tissues. IMPRESSION: 1. No acute intracranial abnormality. 2. No acute fracture or static subluxation of the cervical spine. 3. Small left frontal scalp hematoma without skull fracture. 4. Fracture of the left lamina papyracea with herniation of the left medial rectus muscle into the ethmoid sinus. Small amount of retro bulbar stranding, likely hemorrhage. 5. Right lung apex consolidation concerning for pneumonia. Electronically Signed   By: Ulyses Jarred M.D.   On: 03/03/2020 06:18    Procedures Procedure Name: Intubation Date/Time: 03/03/2020 5:21 AM Performed by: Orpah Greek, MD Pre-anesthesia Checklist: Patient identified, Emergency Drugs available, Suction available, Patient being monitored and Timeout performed Oxygen Delivery Method: Ambu bag Preoxygenation: Pre-oxygenation with 100% oxygen Ventilation: Mask ventilation with difficulty and Two handed mask ventilation required Laryngoscope Size: Glidescope and 4 Grade View: Grade III Tube size: 7.5 mm Number of attempts: 2 Placement Confirmation: ETT inserted through vocal cords under direct vision,  CO2 detector and Breath sounds checked- equal and bilateral Secured at: 23 cm Tube secured with: ETT holder Difficulty Due To: Difficulty was anticipated, Difficult Airway-  due to neck instability, Difficult Airway- due to anterior larynx, Difficult Airway-  due to edematous airway, Difficult Airway- due to limited oral opening and Difficult Airway- due to large tongue     .Critical Care Performed by: Orpah Greek, MD Authorized by: Orpah Greek, MD   Critical care provider statement:    Critical care time (minutes):  40   Critical care time was exclusive of:  Separately billable procedures and treating other patients   Critical care was necessary to treat or prevent imminent or life-threatening deterioration of the following conditions:  CNS  failure or compromise, circulatory failure, respiratory failure and trauma   Critical care was time spent personally by me on the following activities:  Ordering and performing treatments and interventions, ordering and review of laboratory studies, development of treatment plan with patient or surrogate, discussions with consultants, pulse oximetry, ordering and review of radiographic studies, re-evaluation of patient's condition, review of old charts, examination of patient and evaluation of patient's response to treatment   I assumed direction of critical care for this patient from another provider in my specialty: no     (including critical care time)  Medications Ordered in ED Medications  norepinephrine (LEVOPHED) 4mg  in 226mL premix infusion (15 mcg/min Intravenous Rate/Dose Change 03/03/20 0540)  docusate sodium (COLACE) capsule 100 mg (has no administration in time range)  polyethylene glycol (MIRALAX / GLYCOLAX) packet 17 g (has no administration in time range)  pantoprazole (PROTONIX) injection 40  mg (has no administration in time range)  lactated ringers infusion (has no administration in time range)  ondansetron (ZOFRAN) injection 4 mg (has no administration in time range)  aspirin suppository 300 mg (has no administration in time range)  heparin injection 5,000 Units (has no administration in time range)  docusate (COLACE) 50 MG/5ML liquid 100 mg (has no administration in time range)  polyethylene glycol (MIRALAX / GLYCOLAX) packet 17 g (has no administration in time range)  propofol (DIPRIVAN) 1000 MG/100ML infusion (has no administration in time range)  fentaNYL (SUBLIMAZE) injection 25 mcg (has no administration in time range)  fentaNYL 2527mcg in NS 29mL (30mcg/ml) infusion-PREMIX (has no administration in time range)  fentaNYL (SUBLIMAZE) bolus via infusion 25 mcg (has no administration in time range)    ED Course  I have reviewed the triage vital signs and the nursing  notes.  Pertinent labs & imaging results that were available during my care of the patient were reviewed by me and considered in my medical decision making (see chart for details).    MDM Rules/Calculators/A&P                          Patient presented to the emergency department from home.  Family heard patient fall in the bathroom and found her unresponsive.  She was identified as pulseless and CPR was started by family and continued by EMS.  She had approximately 15 minutes of CPR by EMS at which time circulation was regained.  It sounds like the airway was lost and then she lost pulses again.  CPR was reinitiated on arrival to the ER.  This was continued while an airway was established.  Once the endotracheal tube was placed and she was oxygenated, she regained pulses and has remained stable.  She was mildly bradycardic and also hypotensive so she was started on peripheral strength Levophed.  CT head and cervical spine did not show injury.  She does have an orbital blowout fracture.  She does not seem to be doing much neurologically, but if she improves would need Ophtho/maxillofacial consult.  Chest x-ray shows bilateral upper lobe opacities.  Based on her stated history, likely pulmonary edema but could also be infection.  Patient's condition discussed with son and he was periodically updated.  Will consult critical care medicine to admit patient to the ICU.   Final Clinical Impression(s) / ED Diagnoses Final diagnoses:  Trauma  Cardiopulmonary arrest [facility])    Rx / DC Orders ED Discharge Orders    None       Saiquan Hands, Gwenyth Allegra, MD 03/03/20 5056    Orpah Greek, MD 03/03/20 [phone]

## 2020-03-03 NOTE — Progress Notes (Signed)
eLink Physician-Brief Progress Note Patient Name: HATSUE SIME DOB: [birth year] MRN: [id number]   Date of Service  03/03/2020  HPI/Events of Note  Hypoglycemia - Blood glucose = 65.   eICU Interventions  Plan: 1. D10W IV infusion to run at 20 mL/hour.      Intervention Category Major Interventions: Other:  Lysle Dingwall 03/03/2020, 11:34 PM

## 2020-03-04 ENCOUNTER — Inpatient Hospital Stay (HOSPITAL_COMMUNITY): Payer: Medicare HMO

## 2020-03-04 DIAGNOSIS — J9601 Acute respiratory failure with hypoxia: Secondary | ICD-10-CM | POA: Diagnosis not present

## 2020-03-04 DIAGNOSIS — I469 Cardiac arrest, cause unspecified: Secondary | ICD-10-CM | POA: Diagnosis not present

## 2020-03-04 DIAGNOSIS — J9602 Acute respiratory failure with hypercapnia: Secondary | ICD-10-CM | POA: Diagnosis not present

## 2020-03-04 LAB — CBC
HCT: 29.2 % — ABNORMAL LOW (ref 36.0–46.0)
Hemoglobin: 8.3 g/dL — ABNORMAL LOW (ref 12.0–15.0)
MCH: 28.7 pg (ref 26.0–34.0)
MCHC: 28.4 g/dL — ABNORMAL LOW (ref 30.0–36.0)
MCV: 101 fL — ABNORMAL HIGH (ref 80.0–100.0)
Platelets: 196 10*3/uL (ref 150–400)
RBC: 2.89 MIL/uL — ABNORMAL LOW (ref 3.87–5.11)
RDW: 13.6 % (ref 11.5–15.5)
WBC: 15.5 10*3/uL — ABNORMAL HIGH (ref 4.0–10.5)
nRBC: 0 % (ref 0.0–0.2)

## 2020-03-04 LAB — BASIC METABOLIC PANEL
Anion gap: 15 (ref 5–15)
BUN: 47 mg/dL — ABNORMAL HIGH (ref 8–23)
CO2: 32 mmol/L (ref 22–32)
Calcium: 8.3 mg/dL — ABNORMAL LOW (ref 8.9–10.3)
Chloride: 97 mmol/L — ABNORMAL LOW (ref 98–111)
Creatinine, Ser: 2.95 mg/dL — ABNORMAL HIGH (ref 0.44–1.00)
GFR calc Af Amer: 18 mL/min — ABNORMAL LOW (ref >60)
GFR calc non Af Amer: 15 mL/min — ABNORMAL LOW (ref >60)
Glucose, Bld: 91 mg/dL (ref 70–99)
Potassium: 5 mmol/L (ref 3.5–5.1)
Sodium: 144 mmol/L (ref 135–145)

## 2020-03-04 LAB — MAGNESIUM: Magnesium: 2.3 mg/dL (ref 1.7–2.4)

## 2020-03-04 LAB — POCT I-STAT 7, (LYTES, BLD GAS, ICA,H+H)
Acid-Base Excess: 13 mmol/L — ABNORMAL HIGH (ref 0.0–2.0)
Bicarbonate: 39 mmol/L — ABNORMAL HIGH (ref 20.0–28.0)
Calcium, Ion: 1.08 mmol/L — ABNORMAL LOW (ref 1.15–1.40)
HCT: 26 % — ABNORMAL LOW (ref 36.0–46.0)
Hemoglobin: 8.8 g/dL — ABNORMAL LOW (ref 12.0–15.0)
O2 Saturation: 95 %
Patient temperature: 37
Potassium: 4.4 mmol/L (ref 3.5–5.1)
Sodium: 143 mmol/L (ref 135–145)
TCO2: 41 mmol/L — ABNORMAL HIGH (ref 22–32)
pCO2 arterial: 55.1 mmHg — ABNORMAL HIGH (ref 32.0–48.0)
pH, Arterial: 7.458 — ABNORMAL HIGH (ref 7.350–7.450)
pO2, Arterial: 75 mmHg — ABNORMAL LOW (ref 83.0–108.0)

## 2020-03-04 LAB — PHOSPHORUS: Phosphorus: 2.9 mg/dL (ref 2.5–4.6)

## 2020-03-04 LAB — GLUCOSE, CAPILLARY
Glucose-Capillary: 100 mg/dL — ABNORMAL HIGH (ref 70–99)
Glucose-Capillary: 120 mg/dL — ABNORMAL HIGH (ref 70–99)
Glucose-Capillary: 149 mg/dL — ABNORMAL HIGH (ref 70–99)
Glucose-Capillary: 87 mg/dL (ref 70–99)
Glucose-Capillary: 88 mg/dL (ref 70–99)
Glucose-Capillary: 91 mg/dL (ref 70–99)
Glucose-Capillary: 91 mg/dL (ref 70–99)

## 2020-03-04 LAB — TRIGLYCERIDES: Triglycerides: 141 mg/dL (ref <150)

## 2020-03-04 MED ORDER — DEXMEDETOMIDINE HCL IN NACL 400 MCG/100ML IV SOLN
0.2000 ug/kg/h | INTRAVENOUS | Status: DC
  Administered 2020-03-04: 0.5 ug/kg/h via INTRAVENOUS
  Administered 2020-03-04 (×2): 0.4 ug/kg/h via INTRAVENOUS
  Filled 2020-03-04 (×4): qty 100

## 2020-03-04 MED ORDER — SODIUM CHLORIDE 0.9 % IV SOLN
3.0000 g | Freq: Two times a day (BID) | INTRAVENOUS | Status: AC
  Administered 2020-03-04 – 2020-03-10 (×14): 3 g via INTRAVENOUS
  Filled 2020-03-04 (×2): qty 8
  Filled 2020-03-04: qty 3
  Filled 2020-03-04 (×2): qty 8
  Filled 2020-03-04: qty 3
  Filled 2020-03-04: qty 8
  Filled 2020-03-04: qty 3
  Filled 2020-03-04 (×2): qty 8
  Filled 2020-03-04 (×2): qty 3
  Filled 2020-03-04: qty 8
  Filled 2020-03-04: qty 2.47
  Filled 2020-03-04 (×2): qty 3

## 2020-03-04 NOTE — Progress Notes (Addendum)
NAME:  Kimberly Howell, MRN:  [id number], DOB:  [birth year], LOS: 1 ADMISSION DATE:  03/03/2020, CONSULTATION DATE:  03/04/20 REFERRING MD:  EDP, CHIEF COMPLAINT:  Cardiac arrest   Brief History   74 year old female with past medical history of chronic respiratory failure with COPD on 3 L home O2 and CPAP nightly, stage IV CKD, hypertension, morbid obesity who had a cardiac arrest with >45 minutes CPR before ROSC then lost pulses three more times.  Intubated and PCCM consulted for admission  History of present illness   Kimberly Howell is a 74 year old female with past medical history significant for chronic respiratory failure with COPD on 3 L home O2 and CPAP nightly, stage IV CKD, hypertension, morbid obesity.  Her son lives with her and states she had resumed her CPAP last night, he was up in the night with her around 1 AM and she seemed fine.  Several hours later he heard the sound of her falling in the bathroom.  He found her face down and unresponsive and did not think she was breathing so called 911 and started chest compressions.  When EMS arrived CPR performed for 45-50 minutes before ROSC achieved.  Per report, patient lost pulses 3 more times and ended to the ED with CPR in progress.    CT head negative for intracranial bleed, L lamina papyracea fracture with herniation of the L medial rectus muscle.  Labs significant for creatinine 2.08, elevated LFTs AST 392, ALT 247.  She has been unresponsive in the ED with sedation. PCCM consulted for admission  Past Medical History  Morbid obesity with BMI of 50.0-59.9, adult ([place])   Chronic respiratory failure (HCC)   CKD (chronic kidney disease) stage 3, GFR 30-59 ml/min (HCC)   Essential hypertension   OSA on CPAP  Significant Hospital Events   9/18 Admit to PCCM  Consults:    Procedures:  9/18 ETT  Significant Diagnostic Tests:  CT head/C-spine/Maxillofacial>>Small left frontal scalp hematoma without skull fracture. Fracture of the  left lamina papyracea with herniation of the left medial rectus muscle into the ethmoid sinus. Small amount of retro bulbar stranding, likely hemorrhage.  Right lung apex consolidation concerning for pneumonia.  Micro Data:  9/18 Sars-CoV-2>>negative  Antimicrobials:     Interim history/subjective:   No issues overnight.  Stable on targeted temperature management.  Objective   Blood pressure (!) 110/57, pulse 76, temperature 98.8 F (37.1 C), temperature source Bladder, resp. rate (!) 23, height 5\' 4"  (1.626 m), weight (!) 140.8 kg, SpO2 94 %.    Vent Mode: PRVC FiO2 (%):  [40 %-80 %] 40 % Set Rate:  [22 bmp] 22 bmp Vt Set:  [380 mL] 380 mL PEEP:  [10 cmH20] 10 cmH20 Plateau Pressure:  [33 cmH20-47 cmH20] 47 cmH20   Intake/Output Summary (Last 24 hours) at 03/04/2020 1003 Last data filed at 03/04/2020 0900 Gross per 24 hour  Intake 1363 ml  Output 295 ml  Net 1068 ml   Filed Weights   03/03/20 0530 03/04/20 0400  Weight: (!) 145.2 kg (!) 140.8 kg    General: Obese female on mechanical life support. HEENT: Endotracheal tube in place, left periorbital ecchymosis and swelling Neuro: Random blinking of the eyes, not following commands no withdrawal to pain CV: Regular rate rhythm S1-S2, difficult to auscultate due to patient size PULM: Bilateral mechanically ventilated breath sounds GI: Obese pannus soft nontender Extremities: No significant edema Skin: No obvious rash   Resolved Hospital Problem list  Assessment & Plan:    Cardiac arrest with hypoxic and hypercarbic respiratory failure Patient with prolonged downtime.  Greater than 45 minutes. Presumed PEA cardiac arrest.  Had 4 separate events with ROSC obtained. EKG without signs of ischemia.  Patient has been unsteady on her feet recently, unclear inciting event for multiple arrests Plan: Remains euthermic protocol Head of bed elevated Adult mechanical ventilator protocol Mental status precludes weaning  from ventilator at this time. Supportive care for neuro prognostication Sedation on low-dose of fentanyl. Continue to observe for any neurologic recovery. EEG started this morning. Pending these results and once off targeted temperature management can consider MRI of the brain.  Periorbital fracture with hyphema Herniation of the left medial rectus muscle into the sinus -Pending course from above may need ophthalmology input. -However post cardiac arrest and concern for anoxic brain injury can hold off at this time.  Upper lobe pneumonia, likely aspiration during cardiac event. No recent symptoms per son, no leukocytosis or fever -Start Unasyn  Type 2 diabetes -SSI with CBGs  AKI on CKD Creatinine near baseline of 1.7-1.8 (patient has duplicate MRN) -Continue to observe urine output. -Does appear serum creatinine is climbing.  HFpEF, hypertension -Echo Grade 1 diastolic dysfunction  -As needed Lasix to maintain euvolemia however urine output is slacking due to AKI.  Best practice:  Diet: N.p.o. at this time, may need to start tube feeds pain/Anxiety/Delirium protocol (if indicated): As needed fentanyl VAP protocol (if indicated): HOB 30 degrees, suction as needed DVT prophylaxis: SCDs, heparin GI prophylaxis: Protonix Glucose control: SSI Mobility: Bedrest Code Status: Full code Family Communication: I called and spoke with the patient's 2 sons yesterday. Disposition: ICU  Labs   CBC: Recent Labs  Lab 03/03/20 0524 03/03/20 0524 03/03/20 0531 03/03/20 0726 03/03/20 0821 03/04/20 0441 03/04/20 0535  WBC 9.1  --   --   --  16.2* 15.5*  --   HGB 8.1*   < > 9.5* 11.2* 8.5* 8.3* 8.8*  HCT 30.7*   < > 28.0* 33.0* 31.3* 29.2* 26.0*  MCV 109.3*  --   --   --  106.1* 101.0*  --   PLT 154  --   --   --  184 196  --    < > = values in this interval not displayed.    Basic Metabolic Panel: Recent Labs  Lab 03/03/20 0524 03/03/20 0524 03/03/20 0531 03/03/20 0531  03/03/20 0726 03/03/20 0821 03/03/20 1917 03/04/20 0441 03/04/20 0535  NA 144   < > 142   < > 142 145 145 144 143  K 5.7*   < > 5.3*   < > 5.1 5.4* 5.4* 5.0 4.4  CL 101  --  98  --   --  98 100 97*  --   CO2 32  --   --   --   --  35* 33* 32  --   GLUCOSE 167*  --  162*  --   --  175* 86 91  --   BUN 37*  --  42*  --   --  39* 43* 47*  --   CREATININE 2.08*  --  1.90*  --   --  2.10* 2.51* 2.95*  --   CALCIUM 8.4*  --   --   --   --  8.6* 8.6* 8.3*  --   MG  --   --   --   --   --   --   --  2.3  --   PHOS  --   --   --   --   --   --   --  2.9  --    < > = values in this interval not displayed.   GFR: Estimated Creatinine Clearance: 24.6 mL/min (A) (by C-G formula based on SCr of 2.95 mg/dL (H)). Recent Labs  Lab 03/03/20 0524 03/03/20 0821 03/04/20 0441  WBC 9.1 16.2* 15.5*  LATICACIDVEN 2.8*  --   --     Liver Function Tests: Recent Labs  Lab 03/03/20 0524  AST 292*  ALT 247*  ALKPHOS 92  BILITOT 0.9  PROT 5.9*  ALBUMIN 2.5*   No results for input(s): LIPASE, AMYLASE in the last 168 hours. No results for input(s): AMMONIA in the last 168 hours.  ABG    Component Value Date/Time   PHART 7.458 (H) 03/04/2020 0535   PCO2ART 55.1 (H) 03/04/2020 0535   PO2ART 75 (L) 03/04/2020 0535   HCO3 39.0 (H) 03/04/2020 0535   TCO2 41 (H) 03/04/2020 0535   O2SAT 95.0 03/04/2020 0535     Coagulation Profile: Recent Labs  Lab 03/03/20 0524 03/03/20 0821 03/03/20 1917  INR 1.2 1.1 1.1    Cardiac Enzymes: No results for input(s): CKTOTAL, CKMB, CKMBINDEX, TROPONINI in the last 168 hours.  HbA1C: Hgb A1c MFr Bld  Date/Time Value Ref Range Status  03/03/2020 08:21 AM 4.9 4.8 - 5.6 % Final    Comment:    (NOTE) Pre diabetes:          5.7%-6.4%  Diabetes:              >6.4%  Glycemic control for   <7.0% adults with diabetes     CBG: Recent Labs  Lab 03/03/20 2309 03/04/20 0001 03/04/20 0058 03/04/20 0313 03/04/20 0731  GLUCAP 65* 91 91 87 88     This patient is critically ill with multiple organ system failure; which, requires frequent high complexity decision making, assessment, support, evaluation, and titration of therapies. This was completed through the application of advanced monitoring technologies and extensive interpretation of multiple databases. During this encounter critical care time was devoted to patient care services described in this note for 33 minutes.   [facility] 03/04/2020 10:03 AM

## 2020-03-04 NOTE — Progress Notes (Signed)
CCM physicians asking about EEG, EEG tech on call paged

## 2020-03-04 NOTE — Progress Notes (Signed)
Pharmacy Antibiotic Note  Kimberly Howell is a 74 y.o. female admitted on 03/03/2020 with aspiration PNA.  Pharmacy has been consulted for Unasyn dosing. Scr rising, up to 2.97 today.  Est CrCl ~ 25 ml/min.  Plan: Unasyn 3g IV q 12 hrs for now - will monitor renal function closely. F/u cultures, clinical course.  Height: 5\' 4"  (162.6 cm) Weight: (!) 140.8 kg (310 lb 6.5 oz) IBW/kg (Calculated) : 54.7  Temp (24hrs), Avg:98.6 F (37 C), Min:97.9 F (36.6 C), Max:99.3 F (37.4 C)  Recent Labs  Lab 03/03/20 0524 03/03/20 0531 03/03/20 0821 03/03/20 1917 03/04/20 0441  WBC 9.1  --  16.2*  --  15.5*  CREATININE 2.08* 1.90* 2.10* 2.51* 2.95*  LATICACIDVEN 2.8*  --   --   --   --     Estimated Creatinine Clearance: 24.6 mL/min (A) (by C-G formula based on SCr of 2.95 mg/dL (H)).    Allergies  Allergen Reactions  . Prednisone Other (See Comments)    Increases blood sugar    Antimicrobials this admission: Unasyn 9/19 >   Dose adjustments this admission:  Microbiology results: 9/18 MRSA PCR - Positive 9/18 COVID- neg  Thank you for allowing pharmacy to be a part of this patient's care.  [place] Crane, Roylene Reason, BCCP Clinical Pharmacist  03/04/2020 12:18 PM   [facility] pharmacy phone numbers are listed on [email or web address]

## 2020-03-04 NOTE — Progress Notes (Signed)
LTM EEG hooked up and running - no initial skin breakdown - push button tested - neuro notified.  

## 2020-03-04 NOTE — Procedures (Signed)
History: 74 year old female being monitored as part of normothermia protocol  Sedation: Fentanyl and propofol(20 mcg/kg/min)  Technique: This is a 21 channel routine scalp EEG performed at the bedside with bipolar and monopolar montages arranged in accordance to the international 10/20 system of electrode placement. One channel was dedicated to EKG recording.    Background: With stimulation, there does appear to be a posterior dominant rhythm seen briefly with a frequency of 8 to 9 Hz.  In addition, there is intrusion of diffuse theta and delta activities.  Following the first few minutes, there is diffuse muscle artifact largely obscuring the EEG, but no definite underlying changes able to be appreciated.  Photic stimulation: Physiologic driving is not performed  EEG Abnormalities: 1) generalized irregular slow activities  Clinical Interpretation: This EEG is consistent with a generalized nonspecific cerebral dysfunction (encephalopathy). There was no seizure or seizure predisposition recorded on this study. Please note that lack of epileptiform activity on EEG does not preclude the possibility of epilepsy.   Roland Rack, MD [facility] [phone]  If 7pm- 7am, please page neurology on call as listed in [place].

## 2020-03-04 NOTE — Progress Notes (Signed)
EEG complete - results pending 

## 2020-03-05 DIAGNOSIS — I469 Cardiac arrest, cause unspecified: Secondary | ICD-10-CM | POA: Diagnosis not present

## 2020-03-05 DIAGNOSIS — J9602 Acute respiratory failure with hypercapnia: Secondary | ICD-10-CM | POA: Diagnosis not present

## 2020-03-05 DIAGNOSIS — A419 Sepsis, unspecified organism: Secondary | ICD-10-CM

## 2020-03-05 DIAGNOSIS — T1490XA Injury, unspecified, initial encounter: Secondary | ICD-10-CM

## 2020-03-05 DIAGNOSIS — G9340 Encephalopathy, unspecified: Secondary | ICD-10-CM | POA: Diagnosis not present

## 2020-03-05 DIAGNOSIS — J9601 Acute respiratory failure with hypoxia: Secondary | ICD-10-CM | POA: Diagnosis not present

## 2020-03-05 LAB — PHOSPHORUS: Phosphorus: 4.4 mg/dL (ref 2.5–4.6)

## 2020-03-05 LAB — GLUCOSE, CAPILLARY
Glucose-Capillary: 106 mg/dL — ABNORMAL HIGH (ref 70–99)
Glucose-Capillary: 109 mg/dL — ABNORMAL HIGH (ref 70–99)
Glucose-Capillary: 111 mg/dL — ABNORMAL HIGH (ref 70–99)
Glucose-Capillary: 118 mg/dL — ABNORMAL HIGH (ref 70–99)
Glucose-Capillary: 123 mg/dL — ABNORMAL HIGH (ref 70–99)
Glucose-Capillary: 134 mg/dL — ABNORMAL HIGH (ref 70–99)

## 2020-03-05 LAB — TYPE AND SCREEN
ABO/RH(D): B POS
Antibody Screen: NEGATIVE

## 2020-03-05 LAB — TRIGLYCERIDES
Triglycerides: 148 mg/dL (ref <150)
Triglycerides: 195 mg/dL — ABNORMAL HIGH (ref <150)

## 2020-03-05 LAB — ABO/RH: ABO/RH(D): B POS

## 2020-03-05 LAB — MAGNESIUM: Magnesium: 2.3 mg/dL (ref 1.7–2.4)

## 2020-03-05 MED ORDER — PROPOFOL 1000 MG/100ML IV EMUL
5.0000 ug/kg/min | INTRAVENOUS | Status: DC
  Administered 2020-03-05: 10 ug/kg/min via INTRAVENOUS
  Administered 2020-03-06: 5 ug/kg/min via INTRAVENOUS
  Filled 2020-03-05: qty 100

## 2020-03-05 MED ORDER — VITAL HIGH PROTEIN PO LIQD
1000.0000 mL | ORAL | Status: DC
  Administered 2020-03-05 – 2020-03-08 (×7): 1000 mL

## 2020-03-05 MED ORDER — PROPOFOL 1000 MG/100ML IV EMUL
INTRAVENOUS | Status: AC
  Filled 2020-03-05: qty 100

## 2020-03-05 NOTE — Progress Notes (Addendum)
NAME:  Kimberly Howell, MRN:  [id number], DOB:  1946/02/28, LOS: 2 ADMISSION DATE:  03/03/2020, CONSULTATION DATE:  03/05/20 REFERRING MD:  EDP, CHIEF COMPLAINT:  Cardiac arrest   Brief History   74 year old female with past medical history of chronic respiratory failure with COPD on 3 L home O2 and CPAP nightly, stage IV CKD, hypertension, morbid obesity who had a cardiac arrest with >45 minutes CPR before ROSC then lost pulses three more times.  Intubated and PCCM consulted for admission  History of present illness   Kimberly Howell is a 74 year old female with past medical history significant for chronic respiratory failure with COPD on 3 L home O2 and CPAP nightly, stage IV CKD, hypertension, morbid obesity.  Her son lives with her and states she had resumed her CPAP last night, he was up in the night with her around 1 AM and she seemed fine.  Several hours later he heard the sound of her falling in the bathroom.  He found her face down and unresponsive and did not think she was breathing so called 911 and started chest compressions.  When EMS arrived CPR performed for 45-50 minutes before ROSC achieved.  Per report, patient lost pulses 3 more times and ended to the ED with CPR in progress.    CT head negative for intracranial bleed, L lamina papyracea fracture with herniation of the L medial rectus muscle.  Labs significant for creatinine 2.08, elevated LFTs AST 392, ALT 247.  She has been unresponsive in the ED with sedation. PCCM consulted for admission  Past Medical History  Morbid obesity with BMI of 50.0-59.9, adult ([place])   Chronic respiratory failure (HCC)   CKD (chronic kidney disease) stage 3, GFR 30-59 ml/min (HCC)   Essential hypertension   OSA on CPAP  Significant Hospital Events   9/18 Admit to PCCM  Consults:  Ophthamology  Procedures:  9/18 ETT  Significant Diagnostic Tests:  CT head/C-spine/Maxillofacial>>Small left frontal scalp hematoma without skull  fracture. Fracture of the left lamina papyracea with herniation of the left medial rectus muscle into the ethmoid sinus. Small amount of retro bulbar stranding, likely hemorrhage.  Right lung apex consolidation concerning for pneumonia. EEG 9/20 > diffuse encephalopathy  Micro Data:  9/18 Sars-CoV-2>>negative  Antimicrobials:     Interim history/subjective:  Opens eyes to voice purposefully but otherwise not following commands.   Objective   Blood pressure (!) 119/59, pulse 69, temperature 98.8 F (37.1 C), temperature source Bladder, resp. rate (!) 29, height 5\' 4"  (1.626 m), weight (!) 143.8 kg, SpO2 100 %.    Vent Mode: PRVC FiO2 (%):  [40 %] 40 % Set Rate:  [22 bmp] 22 bmp Vt Set:  [380 mL] 380 mL PEEP:  [5 cmH20-10 cmH20] 5 cmH20 Plateau Pressure:  [25 cmH20-28 cmH20] 26 cmH20   Intake/Output Summary (Last 24 hours) at 03/05/2020 1015 Last data filed at 03/05/2020 0900 Gross per 24 hour  Intake 1388.23 ml  Output 1415 ml  Net -26.77 ml   Filed Weights   03/04/20 0400 03/04/20 0500 03/05/20 0600  Weight: (!) 140.8 kg (!) 145.8 kg (!) 143.8 kg    General: Obese female on mechanical life support. HEENT: Endotracheal tube in place, left periorbital ecchymosis and swelling Neuro: Opens eyes purposefully but does not follow other commands CV: Regular rate rhythm S1-S2, difficult to auscultate due to patient size PULM: Bilateral mechanically ventilated breath sounds GI: Obese pannus soft nontender Extremities: No significant edema Skin: No obvious rash  Assessment & Plan:    Cardiac arrest with hypoxic and hypercarbic respiratory failure Patient with prolonged downtime.  Greater than 45 minutes. Presumed PEA cardiac arrest.  Had 4 separate events with ROSC obtained. EKG without signs of ischemia.  Patient has been unsteady on her feet recently, unclear inciting event for multiple arrests - Continue TTM, goal 36 - Wean sedation, if continues to improve and following  commands, can consider early d/c TTM - Continue to observe for further neurologic recovery (so far is purposefully opening eyes but not following other commands) - Once off targeted temperature management can consider MRI of the brain pending neuro status  Periorbital fracture with hyphema - Herniation of the left medial rectus muscle into the sinus with small amount of retro bulbar stranding. - Ophthalmology consulted 9/20 incase any interventions required  Upper lobe pneumonia, likely aspiration during cardiac event. No recent symptoms per son, no leukocytosis or fever - Continue Unasyn  Type 2 diabetes - SSI with CBGs  AKI on CKD - slightly worsened Creatinine near baseline of 1.7-1.8 (patient has duplicate MRN) - Continue supportive care - Consider trial lasix  HFpEF, hypertension - Echo Grade 1 diastolic dysfunction.  - As needed Lasix to maintain euvolemia however urine output is slacking due to AKI.  Best practice:  Diet: TFs Pain/Anxiety/Delirium protocol (if indicated): Fentanyl gtt / Precedex gtt.  RASS goal -1 VAP protocol (if indicated): HOB 30 degrees, suction as needed DVT prophylaxis: SCDs, heparin GI prophylaxis: Protonix Glucose control: SSI Mobility: Bedrest Code Status: Full code Family Communication: Will attempt to call sons today Disposition: ICU   CC time: 40 min.   Montey Hora, [place] [facility] 03/05/2020, 10:34 AM   PCCM attending:  74 year old female past medical history of COPD on 3 L nasal cannula, CPAP for OSA, CKD hypertension morbid obesity septic cardiac arrest at home.  Patient currently on TTM management for you thermia.  BP (!) 119/57   Pulse 62   Temp 99.3 F (37.4 C) (Bladder)   Resp (!) 21   Ht 5\' 4"  (1.626 m)   Wt (!) 143.8 kg   SpO2 99%   BMI 54.42 kg/m   General: Obese female intubated on mechanical life support, TTM pads Heart: Regular rhythm S1-S2  lungs: Bilateral mechanically  ventilated breath sounds Abdomen: Obese  Labs: Reviewed, todays pending   A:  Cardiac arrest Acute hypoxemic hypercarbic respiratory failure requiring intubation mechanical ventilation Prolonged downtime, at risk for hypoxic encephalopathy. Periorbital fracture with herniated medial rectus status post fall Possible aspiration pneumonia, continue Unasyn Type 2 diabetes Hypertension Morbid obesity  Plan: Supportive care postarrest Continue euthermia TTM for the next 24 hours. Complete course of Unasyn for aspiration Continue CBGs with SSI Observe urine output and BMP.  This patient is critically ill with multiple organ system failure; which, requires frequent high complexity decision making, assessment, support, evaluation, and titration of therapies. This was completed through the application of advanced monitoring technologies and extensive interpretation of multiple databases. During this encounter critical care time was devoted to patient care services described in this note for 33 minutes.  Garner Nash, DO [facility] 03/05/2020 11:53 AM

## 2020-03-05 NOTE — Progress Notes (Signed)
eLink Physician-Brief Progress Note Patient Name: YENTY BLOCH DOB: 1945-07-11 MRN: [id number]   Date of Service  03/05/2020  HPI/Events of Note  Bradycardia - HR down into 40's. Precedex IV infusion turned down to 0.2 mcg/kg/hour. HR now = 64.   eICU Interventions  Will adjust Precedex IV infusion dose to 0.2-1.2 mcg/kg/min. Titrated.      Intervention Category Major Interventions: Arrhythmia - evaluation and management  Malikye Reppond [place] 03/05/2020, 4:54 AM

## 2020-03-05 NOTE — Plan of Care (Signed)
  Problem: Education: Goal: Knowledge of General Education information will improve Description: Including pain rating scale, medication(s)/side effects and non-pharmacologic comfort measures 03/05/2020 0156 by Lorane Gell, RN Outcome: Not Applicable    Problem: Health Behavior/Discharge Planning: Goal: Ability to manage health-related needs will improve 03/05/2020 0156 by Lorane Gell, RN Outcome: Pt is ventilated and sedated at this time    Problem: Clinical Measurements: Goal: Ability to maintain clinical measurements within normal limits will improve 03/05/2020 0156 by Lorane Gell, RN Outcome: Not Progressing ( Pt is ventilated and on Levo to maintain BP) cooling blanket in use, does not follow comands Goal: Will remain free from infection 03/05/2020 0156 by Lorane Gell, RN Outcome: Progressing Goal: Diagnostic test results will improve 03/05/2020 0156 by Lorane Gell, RN Outcome: Not Progressing, Will continue to monitor Goal: Respiratory complications will improve 03/05/2020 0156 by Lorane Gell, RN Outcome: Progressing (FiO2 decreased to 40%)  Goal: Cardiovascular complication will be avoided 03/05/2020 0156 by Lorane Gell, RN Outcome: Progressing   Problem: Activity: Goal: Risk for activity intolerance will decrease 03/05/2020 0156 by Lorane Gell, RN Outcome: Not Progressing ( to early to assess pt's activity level at this time)   Problem: Nutrition: Goal: Adequate nutrition will be maintained 03/05/2020 0156 by Lorane Gell, RN Outcome: Not Progressing (NPO)   Problem: Coping: Goal: Level of anxiety will decrease 03/05/2020 0156 by Lorane Gell, RN Outcome: Not Progressing (pt has continuous sedation and pain medication)   Problem: Elimination: Goal: Will not experience complications related to bowel motility 03/05/2020 0156 by Lorane Gell, RN Outcome: Not Progressing ( BS positive, no BM at this time) Goal: Will not experience complications  related to urinary retention 03/05/2020 0156 by Lorane Gell, RN Outcome: Progressing   Problem: Pain Managment: Goal: General experience of comfort will improve 03/05/2020 0156 by Lorane Gell, RN Outcome: Progressing   Problem: Safety: Goal: Ability to remain free from injury will improve 03/05/2020 0156 by Lorane Gell, RN Outcome: Progressing   Problem: Skin Integrity: Goal: Risk for impaired skin integrity will decrease 03/05/2020 0156 by Lorane Gell, RN Outcome: Progressing   Problem: Activity: Goal: Ability to tolerate increased activity will improve 03/05/2020 0156 by Lorane Gell, RN Outcome: Progressing (tolerates turning)   Problem: Respiratory: Goal: Ability to maintain a clear airway and adequate ventilation will improve 03/05/2020 0156 by Lorane Gell, RN Outcome: Progressing    Problem: Role Relationship: Goal: Method of communication will improve 03/05/2020 0156 by Lorane Gell, RN Outcome: Not Applicable

## 2020-03-05 NOTE — Plan of Care (Deleted)
  Problem: Education: Goal: Knowledge of General Education information will improve Description: Including pain rating scale, medication(s)/side effects and non-pharmacologic comfort measures Outcome: Progressing   Problem: Clinical Measurements: Goal: Ability to maintain clinical measurements within normal limits will improve Outcome: Progressing Goal: Will remain free from infection Outcome: Progressing Goal: Diagnostic test results will improve Outcome: Progressing Goal: Respiratory complications will improve Outcome: Progressing Goal: Cardiovascular complication will be avoided Outcome: Progressing   Problem: Activity: Goal: Risk for activity intolerance will decrease Outcome: Progressing   Problem: Nutrition: Goal: Adequate nutrition will be maintained Outcome: Progressing   Problem: Coping: Goal: Level of anxiety will decrease Outcome: Progressing   Problem: Elimination: Goal: Will not experience complications related to bowel motility Outcome: Progressing Goal: Will not experience complications related to urinary retention Outcome: Progressing   Problem: Pain Managment: Goal: General experience of comfort will improve Outcome: Progressing   Problem: Safety: Goal: Ability to remain free from injury will improve Outcome: Progressing   Problem: Skin Integrity: Goal: Risk for impaired skin integrity will decrease Outcome: Progressing   Problem: Activity: Goal: Ability to tolerate increased activity will improve Outcome: Progressing   Problem: Respiratory: Goal: Ability to maintain a clear airway and adequate ventilation will improve Outcome: Progressing   Problem: Role Relationship: Goal: Method of communication will improve Outcome: Progressing   

## 2020-03-05 NOTE — Progress Notes (Signed)
PCCM Interval Progress Note  Discussed with Dr. Coralyn Pear of ophthalmology who has reviewed chart and imaging.  No current urgent interventions required.  He will follow peripherally (he is on call for the rest of this week).  He asks that if pt wakes up and does complain of any visual changes or worsening pain, then please re-engage him / ophtho as might require procedure to relieve ocular pressures, etc.  For now, continue supportive care.   Montey Hora, [place] [facility] 03/05/2020, 12:07 PM

## 2020-03-05 NOTE — Plan of Care (Signed)

## 2020-03-05 NOTE — Progress Notes (Signed)
Initial Nutrition Assessment  DOCUMENTATION CODES:   Morbid obesity  INTERVENTION:   Tube Feeding via OG: Vital High Protein at 60 ml/hr Provides 127 g of protein, 1440 kcals and 1210 mL of free water Meets 100% estimated calorie and protein needs   NUTRITION DIAGNOSIS:   Inadequate oral intake related to acute illness as evidenced by NPO status.  GOAL:   Patient will meet greater than or equal to 90% of their needs  MONITOR:   Vent status, TF tolerance, Labs, Weight trends  REASON FOR ASSESSMENT:   Consult Enteral/tube feeding initiation and management  ASSESSMENT:   74 yo female admitted post cardiac arrest requiring 45-50 minutes of CPR before ROSC achieved followed by loss of pulse 3 more times; pt intubated and sedated; also with periorbital fracture, AKI.  PMH includes COPD on home oxygen, stage IV CKD, HTN, morbid obesity  TTM goal 36 degrees Pt sedated on vent support; on fentanyl and precedex gtt. Requiring levophed  Unable to obtain diet and weight history from patient at this time. No previous weight encounters  OG tube with tip in stomach  Labs: reviewed Meds: ss novolog, miralax, colace, D10 at 20   Diet Order:   Diet Order            Diet NPO time specified  Diet effective now                 EDUCATION NEEDS:   Not appropriate for education at this time  Skin:  Skin Assessment: Reviewed RN Assessment  Last BM:  9/18  Height:   Ht Readings from Last 1 Encounters:  03/03/20 5\' 4"  (1.626 m)    Weight:   Wt Readings from Last 1 Encounters:  03/05/20 (!) 143.8 kg    Ideal Body Weight:  54.5 kg  BMI:  Body mass index is 54.42 kg/m.  Estimated Nutritional Needs:   Kcal:  9311-2162 kcals  Protein:  110-136  Fluid:  >/= 1.5 L   Kerman Passey MS, RDN, LDN, CNSC Registered Dietitian III Clinical Nutrition RD Pager and On-Call Pager Number Located in [place]

## 2020-03-05 NOTE — Progress Notes (Signed)
LTM EEG discontinued - no skin breakdown at unhook.   

## 2020-03-05 NOTE — Procedures (Addendum)
Patient Name: AYLEEN MCKINSTRY  MRN: [id number]  Epilepsy Attending: Lora Havens  Referring Physician/Provider: Dr Marcello Fennel Duration: 03/04/2020 910 to 03/05/2020 0939  Patient history: 74yo M s/p cardiac arrest on TTM. EEG to evaluate for seizure  Level of alertness:  comatose  AEDs during EEG study: None  Technical aspects: This EEG study was done with scalp electrodes positioned according to the 10-20 International system of electrode placement. Electrical activity was acquired at a sampling rate of 500Hz  and reviewed with a high frequency filter of 70Hz  and a low frequency filter of 1Hz . EEG data were recorded continuously and digitally stored.   Description: EEG showed continuous generalized polymorphic low amplitude 3 to 6 Hz theta-delta slowing. EEG was reactive to noxious stimulation. Hyperventilation and photic stimulation were not performed.     ABNORMALITY -Continous slow, generalized  IMPRESSION: This study is suggestive of severe diffuse encephalopathy, nonspecific etiology . No seizures or epileptiform discharges were seen throughout the recording.  Aldan Camey Barbra Sarks

## 2020-03-06 ENCOUNTER — Other Ambulatory Visit (HOSPITAL_COMMUNITY): Payer: Medicare HMO

## 2020-03-06 ENCOUNTER — Inpatient Hospital Stay (HOSPITAL_COMMUNITY): Payer: Medicare HMO

## 2020-03-06 DIAGNOSIS — A419 Sepsis, unspecified organism: Secondary | ICD-10-CM

## 2020-03-06 DIAGNOSIS — J9601 Acute respiratory failure with hypoxia: Secondary | ICD-10-CM | POA: Diagnosis not present

## 2020-03-06 DIAGNOSIS — G9340 Encephalopathy, unspecified: Secondary | ICD-10-CM | POA: Diagnosis not present

## 2020-03-06 DIAGNOSIS — Z9289 Personal history of other medical treatment: Secondary | ICD-10-CM

## 2020-03-06 DIAGNOSIS — I469 Cardiac arrest, cause unspecified: Secondary | ICD-10-CM | POA: Diagnosis not present

## 2020-03-06 DIAGNOSIS — R6521 Severe sepsis with septic shock: Secondary | ICD-10-CM

## 2020-03-06 DIAGNOSIS — J9602 Acute respiratory failure with hypercapnia: Secondary | ICD-10-CM | POA: Diagnosis not present

## 2020-03-06 LAB — CBC WITH DIFFERENTIAL/PLATELET
Abs Immature Granulocytes: 0.12 10*3/uL — ABNORMAL HIGH (ref 0.00–0.07)
Basophils Absolute: 0 10*3/uL (ref 0.0–0.1)
Basophils Relative: 0 %
Eosinophils Absolute: 0.2 10*3/uL (ref 0.0–0.5)
Eosinophils Relative: 1 %
HCT: 29 % — ABNORMAL LOW (ref 36.0–46.0)
Hemoglobin: 8.6 g/dL — ABNORMAL LOW (ref 12.0–15.0)
Immature Granulocytes: 1 %
Lymphocytes Relative: 5 %
Lymphs Abs: 0.9 10*3/uL (ref 0.7–4.0)
MCH: 29.6 pg (ref 26.0–34.0)
MCHC: 29.7 g/dL — ABNORMAL LOW (ref 30.0–36.0)
MCV: 99.7 fL (ref 80.0–100.0)
Monocytes Absolute: 1.1 10*3/uL — ABNORMAL HIGH (ref 0.1–1.0)
Monocytes Relative: 6 %
Neutro Abs: 14.9 10*3/uL — ABNORMAL HIGH (ref 1.7–7.7)
Neutrophils Relative %: 87 %
Platelets: 244 10*3/uL (ref 150–400)
RBC: 2.91 MIL/uL — ABNORMAL LOW (ref 3.87–5.11)
RDW: 13.7 % (ref 11.5–15.5)
WBC: 17.3 10*3/uL — ABNORMAL HIGH (ref 4.0–10.5)
nRBC: 0 % (ref 0.0–0.2)

## 2020-03-06 LAB — COMPREHENSIVE METABOLIC PANEL
ALT: 118 U/L — ABNORMAL HIGH (ref 0–44)
AST: 90 U/L — ABNORMAL HIGH (ref 15–41)
Albumin: 2.3 g/dL — ABNORMAL LOW (ref 3.5–5.0)
Alkaline Phosphatase: 80 U/L (ref 38–126)
Anion gap: 13 (ref 5–15)
BUN: 57 mg/dL — ABNORMAL HIGH (ref 8–23)
CO2: 31 mmol/L (ref 22–32)
Calcium: 7.6 mg/dL — ABNORMAL LOW (ref 8.9–10.3)
Chloride: 99 mmol/L (ref 98–111)
Creatinine, Ser: 3.14 mg/dL — ABNORMAL HIGH (ref 0.44–1.00)
GFR calc Af Amer: 16 mL/min — ABNORMAL LOW (ref >60)
GFR calc non Af Amer: 14 mL/min — ABNORMAL LOW (ref >60)
Glucose, Bld: 145 mg/dL — ABNORMAL HIGH (ref 70–99)
Potassium: 4.4 mmol/L (ref 3.5–5.1)
Sodium: 143 mmol/L (ref 135–145)
Total Bilirubin: 0.8 mg/dL (ref 0.3–1.2)
Total Protein: 6.2 g/dL — ABNORMAL LOW (ref 6.5–8.1)

## 2020-03-06 LAB — PHOSPHORUS
Phosphorus: 4.8 mg/dL — ABNORMAL HIGH (ref 2.5–4.6)
Phosphorus: 3.6 mg/dL (ref 2.5–4.6)

## 2020-03-06 LAB — MAGNESIUM
Magnesium: 2.2 mg/dL (ref 1.7–2.4)
Magnesium: 2.2 mg/dL (ref 1.7–2.4)

## 2020-03-06 LAB — GLUCOSE, CAPILLARY
Glucose-Capillary: 113 mg/dL — ABNORMAL HIGH (ref 70–99)
Glucose-Capillary: 125 mg/dL — ABNORMAL HIGH (ref 70–99)
Glucose-Capillary: 134 mg/dL — ABNORMAL HIGH (ref 70–99)
Glucose-Capillary: 137 mg/dL — ABNORMAL HIGH (ref 70–99)
Glucose-Capillary: 139 mg/dL — ABNORMAL HIGH (ref 70–99)
Glucose-Capillary: 149 mg/dL — ABNORMAL HIGH (ref 70–99)
Glucose-Capillary: 158 mg/dL — ABNORMAL HIGH (ref 70–99)

## 2020-03-06 MED ORDER — ACETAMINOPHEN 160 MG/5ML PO SOLN
650.0000 mg | Freq: Four times a day (QID) | ORAL | Status: DC | PRN
  Filled 2020-03-06: qty 20.3

## 2020-03-06 MED ORDER — DOCUSATE SODIUM 50 MG/5ML PO LIQD
100.0000 mg | Freq: Two times a day (BID) | ORAL | Status: DC | PRN
  Administered 2020-03-08 – 2020-03-11 (×2): 100 mg

## 2020-03-06 MED ORDER — PANTOPRAZOLE SODIUM 40 MG PO PACK
40.0000 mg | PACK | Freq: Every day | ORAL | Status: DC
  Administered 2020-03-06 – 2020-03-12 (×7): 40 mg
  Filled 2020-03-06 (×7): qty 20

## 2020-03-06 MED ORDER — ACETAMINOPHEN 160 MG/5ML PO SOLN
650.0000 mg | Freq: Four times a day (QID) | ORAL | Status: DC | PRN
  Administered 2020-03-06 – 2020-04-09 (×16): 650 mg
  Filled 2020-03-06 (×15): qty 20.3

## 2020-03-06 MED ORDER — NOREPINEPHRINE 16 MG/250ML-% IV SOLN
2.0000 ug/min | INTRAVENOUS | Status: DC
  Administered 2020-03-06: 2 ug/min via INTRAVENOUS
  Administered 2020-03-08: 12 ug/min via INTRAVENOUS
  Filled 2020-03-06 (×2): qty 250

## 2020-03-06 MED ORDER — MIDAZOLAM HCL 2 MG/2ML IJ SOLN
1.0000 mg | INTRAMUSCULAR | Status: DC | PRN
  Administered 2020-03-06 – 2020-03-07 (×3): 2 mg via INTRAVENOUS
  Filled 2020-03-06 (×3): qty 2

## 2020-03-06 MED ORDER — POLYETHYLENE GLYCOL 3350 17 G PO PACK
17.0000 g | PACK | Freq: Every day | ORAL | Status: DC | PRN
  Administered 2020-03-19: 17 g
  Filled 2020-03-06: qty 1

## 2020-03-06 NOTE — Procedures (Signed)
Central Venous Catheter Insertion Procedure Note  KELISSA MERLIN  [id number]  Dec 21, 1945  Date:03/06/20  Time:8:56 AM   Provider Performing:Ninoska Goswick Shearon Stalls   Procedure: Insertion of Non-tunneled Central Venous Catheter(36556) with US guidance (21115)   Indication(s) Medication administration and Difficult access  Consent Unable to obtain consent due to emergent nature of procedure.  Anesthesia Topical only with 1% lidocaine   Timeout Verified patient identification, verified procedure, site/side was marked, verified correct patient position, special equipment/implants available, medications/allergies/relevant history reviewed, required imaging and test results available.  Sterile Technique Maximal sterile technique including full sterile barrier drape, hand hygiene, sterile gown, sterile gloves, mask, hair covering, sterile ultrasound probe cover (if used).  Procedure Description Area of catheter insertion was cleaned with chlorhexidine and draped in sterile fashion.  With real-time ultrasound guidance a central venous catheter was placed into the left internal jugular vein. Nonpulsatile blood flow and easy flushing noted in all ports.  The catheter was sutured in place and sterile dressing applied.  Complications/Tolerance None; patient tolerated the procedure well. Chest X-ray is ordered to verify placement for internal jugular or subclavian cannulation.   Chest x-ray is not ordered for femoral cannulation.  EBL Minimal  Specimen(s) None   Montey Hora, [place] [facility] 03/06/2020, 8:57 AM

## 2020-03-06 NOTE — Progress Notes (Addendum)
NAME:  Kimberly Howell, MRN:  [id number], DOB:  08/19/1945, LOS: 3 ADMISSION DATE:  03/03/2020, CONSULTATION DATE:  03/06/20 REFERRING MD:  EDP, CHIEF COMPLAINT:  Cardiac arrest   Brief History   74 year old female with past medical history of chronic respiratory failure with COPD on 3 L home O2 and CPAP nightly, stage IV CKD, hypertension, morbid obesity who had a cardiac arrest with >45 minutes CPR before ROSC then lost pulses three more times.  Intubated and PCCM consulted for admission  History of present illness   [name] is a 74 year old female with past medical history significant for chronic respiratory failure with COPD on 3 L home O2 and CPAP nightly, stage IV CKD, hypertension, morbid obesity.  Her son lives with her and states she had resumed her CPAP last night, he was up in the night with her around 1 AM and she seemed fine.  Several hours later he heard the sound of her falling in the bathroom.  He found her face down and unresponsive and did not think she was breathing so called 911 and started chest compressions.  When EMS arrived CPR performed for 45-50 minutes before ROSC achieved.  Per report, patient lost pulses 3 more times and ended to the ED with CPR in progress.    CT head negative for intracranial bleed, L lamina papyracea fracture with herniation of the L medial rectus muscle.  Labs significant for creatinine 2.08, elevated LFTs AST 392, ALT 247.  She has been unresponsive in the ED with sedation. PCCM consulted for admission  Past Medical History  Morbid obesity with BMI of 50.0-59.9, adult ([place])   Chronic respiratory failure (HCC)   CKD (chronic kidney disease) stage 3, GFR 30-59 ml/min (HCC)   Essential hypertension   OSA on CPAP  Significant Hospital Events   9/18 Admit to PCCM  Consults:  Ophthamology  Procedures:  9/18 ETT  Significant Diagnostic Tests:  CT head/C-spine/Maxillofacial>>Small left frontal scalp hematoma without skull  fracture. Fracture of the left lamina papyracea with herniation of the left medial rectus muscle into the ethmoid sinus. Small amount of retro bulbar stranding, likely hemorrhage.  Right lung apex consolidation concerning for pneumonia. EEG 9/20 > diffuse encephalopathy  Micro Data:  9/18 Sars-CoV-2>>negative  Antimicrobials:     Interim history/subjective:  Became agitated overnight, started on propofol. No labs as unable to get blood. TTM completed.  Objective   Blood pressure (!) 106/57, pulse 71, temperature 98.8 F (37.1 C), temperature source Bladder, resp. rate 11, height 5\' 4"  (1.626 m), weight (!) 145.4 kg, SpO2 92 %.    Vent Mode: PRVC FiO2 (%):  [40 %-50 %] 40 % Set Rate:  [22 bmp] 22 bmp Vt Set:  [380 mL] 380 mL PEEP:  [5 cmH20] 5 cmH20 Plateau Pressure:  [26 cmH20-29 cmH20] 29 cmH20   Intake/Output Summary (Last 24 hours) at 03/06/2020 0741 Last data filed at 03/06/2020 0700 Gross per 24 hour  Intake 2576.16 ml  Output 883 ml  Net 1693.16 ml   Filed Weights   03/04/20 0500 03/05/20 0600 03/06/20 0600  Weight: (!) 145.8 kg (!) 143.8 kg (!) 145.4 kg     General: Obese female on mechanical life support. HEENT: Endotracheal tube in place, left periorbital ecchymosis and swelling Neuro: Opens eyes purposefully intermittently (not as well as yesterday) but does not follow other commands CV: Regular rate rhythm S1-S2, difficult to auscultate due to patient size PULM: Bilateral mechanically ventilated breath sounds GI: Obese pannus soft  nontender Extremities: No significant edema Skin: No obvious rash   Assessment & Plan:    Cardiac arrest with hypoxic and hypercarbic respiratory failure Patient with prolonged downtime.  Greater than 45 minutes. Presumed PEA cardiac arrest.  Had 4 separate events with ROSC obtained. EKG without signs of ischemia.  Patient has been unsteady on her feet recently, unclear inciting event for multiple arrests - Can d/c TTM now -  Avoid fevers - Wean sedation to assess neuro status - Continue to observe for further neurologic recovery (so far is purposefully opening eyes but not following other commands) - Consider MRI of the brain pending neuro status  Hypotension - presumed 2/2 sedation. - Continue low dose levophed as needed to maintain goal MAP > 65 - Place arterial line now  Periorbital fracture with hyphema - Herniation of the left medial rectus muscle into the sinus with small amount of retro bulbar stranding. - Ophthalmology consulted 9/20 incase any interventions required, nothing for now.  They will follow peripherally unless pt wakes up and has visual changes / pain / etc.  Upper lobe pneumonia, likely aspiration during cardiac event. No recent symptoms per son, no leukocytosis or fever - Continue Unasyn  Type 2 diabetes - SSI with CBGs  AKI on CKD - unsure of where we stand now as no labs for 2 days.  Has had drop in UOP, now +3L net Creatinine near baseline of 1.7-1.8 (patient has duplicate MRN) - Place arterial line then send labs now - Continue supportive care - Consider lasix  HFpEF, hypertension - Echo Grade 1 diastolic dysfunction.  - As needed Lasix to maintain euvolemia however urine output is slacking due to AKI.  Best practice:  Diet: TFs Pain/Anxiety/Delirium protocol (if indicated): Fentanyl gtt / Midazolam PRN.  RASS goal -1 VAP protocol (if indicated): HOB 30 degrees, suction as needed DVT prophylaxis: SCDs, heparin GI prophylaxis: Protonix Glucose control: SSI Mobility: Bedrest Code Status: Full code Family Communication: Will attempt to call sons today Disposition: ICU   CC time: 30 min.   Montey Hora, [place] [facility] 03/06/2020, 7:41 AM   PCCM:  74 yo FM s/p cardiac arrest s/p euthermic protocol. COPD baseline. ?>45 min downtime. CKD IV baseline   BP (!) 99/36 (BP Location: Right Leg)   Pulse 73   Temp (!) 100.9 F (38.3 C)  (Bladder)   Resp (!) 22   Ht 5\' 4"  (1.626 m)   Wt (!) 145.4 kg   SpO2 100%   BMI 55.02 kg/m   Gen: obese fm, intubated on life support  Heart: RRR s1 s2  Lungs: BL vented breaths  Abd: obese   Labs reviewed   A:  Cardiac arrest  AHRF on MV  Likely PEA arrest  Periorbital trauma  Aspiration PNA  DMII  AKI on CKD  P: Removing pads today  Decreasing sedation needs  She is following minimal commands? Maybe a few more days and we could attempt extubation. Follow uop and kidney function, no indication for dialysis at this time  Continue unasyn  This patient is critically ill with multiple organ system failure; which, requires frequent high complexity decision making, assessment, support, evaluation, and titration of therapies. This was completed through the application of advanced monitoring technologies and extensive interpretation of multiple databases. During this encounter critical care time was devoted to patient care services described in this note for 32 minutes.  [facility] 03/06/2020 4:58  PM

## 2020-03-06 NOTE — TOC CAGE-AID Note (Signed)
Transition of Care [facility]) - CAGE-AID Screening   Patient Details  Name: LYNDSEE CASA MRN: [id number] Date of Birth: September 01, 1945  [facility]) CM/SW Contact:    Emeterio Reeve, [place] Phone Number: 03/06/2020, 10:43 AM   Clinical Narrative:  Pt is unable to participate in assessment due to being on ventilator.   CAGE-AID Screening: Substance Abuse Screening unable to be completed due to: : Patient unable to participate            [place] Clinical Social Worker [phone]

## 2020-03-07 ENCOUNTER — Inpatient Hospital Stay (HOSPITAL_COMMUNITY): Payer: Medicare HMO

## 2020-03-07 DIAGNOSIS — J9601 Acute respiratory failure with hypoxia: Secondary | ICD-10-CM | POA: Diagnosis not present

## 2020-03-07 LAB — GLUCOSE, CAPILLARY
Glucose-Capillary: 153 mg/dL — ABNORMAL HIGH (ref 70–99)
Glucose-Capillary: 159 mg/dL — ABNORMAL HIGH (ref 70–99)
Glucose-Capillary: 161 mg/dL — ABNORMAL HIGH (ref 70–99)
Glucose-Capillary: 168 mg/dL — ABNORMAL HIGH (ref 70–99)
Glucose-Capillary: 168 mg/dL — ABNORMAL HIGH (ref 70–99)
Glucose-Capillary: 175 mg/dL — ABNORMAL HIGH (ref 70–99)
Glucose-Capillary: 177 mg/dL — ABNORMAL HIGH (ref 70–99)
Glucose-Capillary: 199 mg/dL — ABNORMAL HIGH (ref 70–99)

## 2020-03-07 LAB — CBC
HCT: 24.7 % — ABNORMAL LOW (ref 36.0–46.0)
Hemoglobin: 7.3 g/dL — ABNORMAL LOW (ref 12.0–15.0)
MCH: 29.1 pg (ref 26.0–34.0)
MCHC: 29.6 g/dL — ABNORMAL LOW (ref 30.0–36.0)
MCV: 98.4 fL (ref 80.0–100.0)
Platelets: 199 10*3/uL (ref 150–400)
RBC: 2.51 MIL/uL — ABNORMAL LOW (ref 3.87–5.11)
RDW: 14.2 % (ref 11.5–15.5)
WBC: 13.6 10*3/uL — ABNORMAL HIGH (ref 4.0–10.5)
nRBC: 0 % (ref 0.0–0.2)

## 2020-03-07 LAB — BASIC METABOLIC PANEL
Anion gap: 13 (ref 5–15)
BUN: 63 mg/dL — ABNORMAL HIGH (ref 8–23)
CO2: 31 mmol/L (ref 22–32)
Calcium: 7.4 mg/dL — ABNORMAL LOW (ref 8.9–10.3)
Chloride: 101 mmol/L (ref 98–111)
Creatinine, Ser: 3.19 mg/dL — ABNORMAL HIGH (ref 0.44–1.00)
GFR calc Af Amer: 16 mL/min — ABNORMAL LOW (ref >60)
GFR calc non Af Amer: 14 mL/min — ABNORMAL LOW (ref >60)
Glucose, Bld: 205 mg/dL — ABNORMAL HIGH (ref 70–99)
Potassium: 4.1 mmol/L (ref 3.5–5.1)
Sodium: 145 mmol/L (ref 135–145)

## 2020-03-07 LAB — MAGNESIUM: Magnesium: 2.2 mg/dL (ref 1.7–2.4)

## 2020-03-07 LAB — HEMOGLOBIN AND HEMATOCRIT, BLOOD
HCT: 24.8 % — ABNORMAL LOW (ref 36.0–46.0)
Hemoglobin: 7.2 g/dL — ABNORMAL LOW (ref 12.0–15.0)

## 2020-03-07 LAB — PHOSPHORUS: Phosphorus: 3.6 mg/dL (ref 2.5–4.6)

## 2020-03-07 MED ORDER — FUROSEMIDE 10 MG/ML IJ SOLN
40.0000 mg | Freq: Four times a day (QID) | INTRAMUSCULAR | Status: AC
  Administered 2020-03-07 (×2): 40 mg via INTRAVENOUS
  Filled 2020-03-07 (×2): qty 4

## 2020-03-07 MED ORDER — SORBITOL 70 % SOLN
30.0000 mL | Freq: Every day | Status: DC
  Administered 2020-03-07: 30 mL via ORAL
  Filled 2020-03-07: qty 30

## 2020-03-07 MED ORDER — METOLAZONE 5 MG PO TABS
5.0000 mg | ORAL_TABLET | Freq: Once | ORAL | Status: AC
  Administered 2020-03-07: 5 mg via ORAL
  Filled 2020-03-07: qty 1

## 2020-03-07 MED ORDER — SORBITOL 70 % SOLN
30.0000 mL | Freq: Every day | Status: DC
  Administered 2020-03-08 – 2020-03-15 (×5): 30 mL
  Filled 2020-03-07 (×7): qty 30

## 2020-03-07 NOTE — Plan of Care (Signed)
  Problem: Clinical Measurements: Goal: Ability to maintain clinical measurements within normal limits will improve Outcome: Progressing Goal: Will remain free from infection Outcome: Progressing Goal: Diagnostic test results will improve Outcome: Progressing Goal: Cardiovascular complication will be avoided Outcome: Progressing   

## 2020-03-07 NOTE — Progress Notes (Signed)
NAME:  Kimberly Howell, MRN:  [id number], DOB:  [birth year], LOS: 4 ADMISSION DATE:  03/03/2020, CONSULTATION DATE:  03/07/20 REFERRING MD:  EDP, CHIEF COMPLAINT:  Cardiac arrest   Brief History   74 year old female with past medical history of chronic respiratory failure with COPD on 3 L home O2 and CPAP nightly, stage IV CKD, hypertension, morbid obesity who had a cardiac arrest with >45 minutes CPR before ROSC then lost pulses three more times.  Intubated and PCCM consulted for admission  History of present illness   Kimberly Howell is a 74 year old female with past medical history significant for chronic respiratory failure with COPD on 3 L home O2 and CPAP nightly, stage IV CKD, hypertension, morbid obesity.  Her son lives with her and states she had resumed her CPAP last night, he was up in the night with her around 1 AM and she seemed fine.  Several hours later he heard the sound of her falling in the bathroom.  He found her face down and unresponsive and did not think she was breathing so called 911 and started chest compressions.  When EMS arrived CPR performed for 45-50 minutes before ROSC achieved.  Per report, patient lost pulses 3 more times and ended to the ED with CPR in progress.    CT head negative for intracranial bleed, L lamina papyracea fracture with herniation of the L medial rectus muscle.  Labs significant for creatinine 2.08, elevated LFTs AST 392, ALT 247.  She has been unresponsive in the ED with sedation. PCCM consulted for admission  Past Medical History  Morbid obesity with BMI of 50.0-59.9, adult ([place])   Chronic respiratory failure (HCC)   CKD (chronic kidney disease) stage 3, GFR 30-59 ml/min (HCC)   Essential hypertension   OSA on CPAP  Significant Hospital Events   9/18 Admit to PCCM  Consults:  Ophthamology  Procedures:  9/18 ETT  Significant Diagnostic Tests:  CT head/C-spine/Maxillofacial>>Small left frontal scalp hematoma without skull  fracture. Fracture of the left lamina papyracea with herniation of the left medial rectus muscle into the ethmoid sinus. Small amount of retro bulbar stranding, likely hemorrhage.  Right lung apex consolidation concerning for pneumonia. EEG 9/20 > diffuse encephalopathy  Micro Data:  9/18 Sars-CoV-2>>negative  Antimicrobials:     Interim history/subjective:  No events, wakes up and follows commands with sedation wean. Fails SBT due to fatigue.  Objective   Blood pressure (!) 108/48, pulse 74, temperature 99.5 F (37.5 C), resp. rate (!) 22, height 5\' 4"  (1.626 m), weight (!) 139.6 kg, SpO2 100 %.    Vent Mode: PRVC FiO2 (%):  [40 %] 40 % Set Rate:  [22 bmp] 22 bmp Vt Set:  [380 mL] 380 mL PEEP:  [5 cmH20] 5 cmH20 Plateau Pressure:  [20 cmH20-30 cmH20] 26 cmH20   Intake/Output Summary (Last 24 hours) at 03/07/2020 1016 Last data filed at 03/07/2020 0800 Gross per 24 hour  Intake 2367.36 ml  Output 620 ml  Net 1747.36 ml   Filed Weights   03/05/20 0600 03/06/20 0600 03/07/20 0245  Weight: (!) 143.8 kg (!) 145.4 kg (!) 139.6 kg     Constitutional: chronically ill obese woman in no acute distress Eyes: eyes are anicteric, reactive to light Ears, nose, mouth, and throat: mucous membranes moist, trachea midline Cardiovascular: heart sounds are regular, ext are warm to touch. trace edema Respiratory: Diminished bases, no accessory muscle sounds Gastrointestinal: abdomen is protuberant with hypoactive BS Skin: No rashes, normal turgor Neurologic:  deferred, was moving everything when not sedated per nursing Psychiatric: RASS -3    Assessment & Plan:    Cardiac arrest with hypoxic and hypercarbic respiratory failure Patient with prolonged downtime.  Greater than 45 minutes. Presumed PEA cardiac arrest.  Had 4 separate events with ROSC obtained.- EKG without signs of ischemia.  Patient has been unsteady on her feet recently, unclear inciting event for multiple arrests -  Neurologically seems to be doing better - Continue daily SATs as able - Avoid fevers  Hypotension - presumed 2/2 sedation plus lingering sepsis - Continue low dose levophed as needed to maintain goal MAP > 65 - Place arterial line now  Periorbital fracture with hyphema - Herniation of the left medial rectus muscle into the sinus with small amount of retro bulbar stranding. - Ophthalmology consulted 9/20 incase any interventions required, nothing for now.  They will follow peripherally unless pt wakes up and has visual changes / pain / etc.  Upper lobe pneumonia, likely aspiration during cardiac event. No recent symptoms per son, no leukocytosis or fever - Continue Unasyn x 7 days  Type 2 diabetes - SSI with CBGs  AKI on CKD- oliguric, ATN related to cardiac arrest HFpEF, hypertension - Echo Grade 1 diastolic dysfunction. Creatinine near baseline of 1.7-1.8 (patient has duplicate MRN) - Diuretic challenge, stop IVF - Strict I/O - High risk for needing HD  Constipation - Check KUB, add sorbitol, consider reglan if ileus on KUB   Best practice:  Diet: TFs Pain/Anxiety/Delirium protocol (if indicated): Fentanyl gtt / Midazolam PRN.  RASS goal -1, need to try to lighten this today VAP protocol (if indicated): HOB 30 degrees, suction as needed DVT prophylaxis: SCDs, heparin GI prophylaxis: Protonix Glucose control: SSI Mobility: Bedrest Code Status: Full code Family Communication: Updated son at bedside 9/22 Disposition: ICU   The patient is critically ill with multiple organ systems failure and requires high complexity decision making for assessment and support, frequent evaluation and titration of therapies, application of advanced monitoring technologies and extensive interpretation of multiple databases. Critical Care Time devoted to patient care services described in this note independent of APP/resident time (if applicable)  is 35 minutes.   Erskine Emery MD [facility] 03/07/2020 10:23 AM Personal pager: [phone] If unanswered, please page CCM On-call: [phone]

## 2020-03-08 ENCOUNTER — Inpatient Hospital Stay (HOSPITAL_COMMUNITY): Payer: Medicare HMO

## 2020-03-08 DIAGNOSIS — J9601 Acute respiratory failure with hypoxia: Secondary | ICD-10-CM | POA: Diagnosis not present

## 2020-03-08 LAB — BASIC METABOLIC PANEL
Anion gap: 15 (ref 5–15)
BUN: 74 mg/dL — ABNORMAL HIGH (ref 8–23)
CO2: 31 mmol/L (ref 22–32)
Calcium: 7.4 mg/dL — ABNORMAL LOW (ref 8.9–10.3)
Chloride: 99 mmol/L (ref 98–111)
Creatinine, Ser: 3.08 mg/dL — ABNORMAL HIGH (ref 0.44–1.00)
GFR calc Af Amer: 17 mL/min — ABNORMAL LOW (ref >60)
GFR calc non Af Amer: 15 mL/min — ABNORMAL LOW (ref >60)
Glucose, Bld: 225 mg/dL — ABNORMAL HIGH (ref 70–99)
Potassium: 4.2 mmol/L (ref 3.5–5.1)
Sodium: 145 mmol/L (ref 135–145)

## 2020-03-08 LAB — GLUCOSE, CAPILLARY
Glucose-Capillary: 126 mg/dL — ABNORMAL HIGH (ref 70–99)
Glucose-Capillary: 183 mg/dL — ABNORMAL HIGH (ref 70–99)
Glucose-Capillary: 193 mg/dL — ABNORMAL HIGH (ref 70–99)
Glucose-Capillary: 197 mg/dL — ABNORMAL HIGH (ref 70–99)
Glucose-Capillary: 224 mg/dL — ABNORMAL HIGH (ref 70–99)
Glucose-Capillary: 306 mg/dL — ABNORMAL HIGH (ref 70–99)

## 2020-03-08 LAB — CBC
HCT: 25.3 % — ABNORMAL LOW (ref 36.0–46.0)
Hemoglobin: 7.4 g/dL — ABNORMAL LOW (ref 12.0–15.0)
MCH: 29.4 pg (ref 26.0–34.0)
MCHC: 29.2 g/dL — ABNORMAL LOW (ref 30.0–36.0)
MCV: 100.4 fL — ABNORMAL HIGH (ref 80.0–100.0)
Platelets: 219 10*3/uL (ref 150–400)
RBC: 2.52 MIL/uL — ABNORMAL LOW (ref 3.87–5.11)
RDW: 14.1 % (ref 11.5–15.5)
WBC: 14.5 10*3/uL — ABNORMAL HIGH (ref 4.0–10.5)
nRBC: 0.2 % (ref 0.0–0.2)

## 2020-03-08 MED ORDER — ALBUMIN HUMAN 25 % IV SOLN
25.0000 g | Freq: Four times a day (QID) | INTRAVENOUS | Status: AC
  Administered 2020-03-08 – 2020-03-09 (×4): 25 g via INTRAVENOUS
  Filled 2020-03-08 (×4): qty 100

## 2020-03-08 MED ORDER — INSULIN DETEMIR 100 UNIT/ML ~~LOC~~ SOLN
12.0000 [IU] | Freq: Two times a day (BID) | SUBCUTANEOUS | Status: DC
  Administered 2020-03-08 – 2020-03-14 (×11): 12 [IU] via SUBCUTANEOUS
  Filled 2020-03-08 (×14): qty 0.12

## 2020-03-08 MED ORDER — VITAL HIGH PROTEIN PO LIQD
1000.0000 mL | ORAL | Status: DC
  Administered 2020-03-09 – 2020-03-12 (×4): 1000 mL

## 2020-03-08 MED ORDER — INSULIN DETEMIR 100 UNIT/ML ~~LOC~~ SOLN: 20.0000 [IU] | Freq: Two times a day (BID) | SUBCUTANEOUS | Status: DC

## 2020-03-08 MED ORDER — DEXMEDETOMIDINE HCL IN NACL 400 MCG/100ML IV SOLN
0.4000 ug/kg/h | INTRAVENOUS | Status: DC
  Administered 2020-03-08: 1 ug/kg/h via INTRAVENOUS
  Administered 2020-03-08: 1.2 ug/kg/h via INTRAVENOUS
  Administered 2020-03-08: 0.8 ug/kg/h via INTRAVENOUS
  Administered 2020-03-08 – 2020-03-09 (×3): 0.4 ug/kg/h via INTRAVENOUS
  Filled 2020-03-08 (×2): qty 100
  Filled 2020-03-08: qty 200
  Filled 2020-03-08 (×3): qty 100

## 2020-03-08 MED ORDER — NOREPINEPHRINE 16 MG/250ML-% IV SOLN
0.0000 ug/min | INTRAVENOUS | Status: DC
  Filled 2020-03-08 (×2): qty 250

## 2020-03-08 MED ORDER — BISACODYL 10 MG RE SUPP
10.0000 mg | Freq: Every day | RECTAL | Status: DC
  Administered 2020-03-08 – 2020-03-10 (×3): 10 mg via RECTAL
  Filled 2020-03-08 (×3): qty 1

## 2020-03-08 MED ORDER — SODIUM CHLORIDE 0.9% FLUSH
10.0000 mL | Freq: Two times a day (BID) | INTRAVENOUS | Status: DC
  Administered 2020-03-08 – 2020-03-09 (×3): 10 mL
  Administered 2020-03-09 – 2020-03-10 (×2): 20 mL
  Administered 2020-03-10: 10 mL
  Administered 2020-03-11: 20 mL
  Administered 2020-03-11 – 2020-03-22 (×22): 10 mL

## 2020-03-08 MED ORDER — METOLAZONE 5 MG PO TABS
5.0000 mg | ORAL_TABLET | Freq: Once | ORAL | Status: AC
  Administered 2020-03-08: 5 mg via ORAL
  Filled 2020-03-08: qty 1

## 2020-03-08 MED ORDER — NOREPINEPHRINE 4 MG/250ML-% IV SOLN: 0.0000 ug/min | INTRAVENOUS | Status: DC

## 2020-03-08 MED ORDER — FUROSEMIDE 10 MG/ML IJ SOLN
60.0000 mg | Freq: Four times a day (QID) | INTRAMUSCULAR | Status: AC
  Administered 2020-03-08 (×2): 60 mg via INTRAVENOUS
  Filled 2020-03-08 (×2): qty 6

## 2020-03-08 MED ORDER — SODIUM CHLORIDE 0.9% FLUSH: 10.0000 mL | INTRAVENOUS | Status: DC | PRN

## 2020-03-08 MED ORDER — CHLORHEXIDINE GLUCONATE CLOTH 2 % EX PADS
6.0000 | MEDICATED_PAD | Freq: Every day | CUTANEOUS | Status: DC
  Administered 2020-03-09 – 2020-04-10 (×31): 6 via TOPICAL

## 2020-03-08 MED ORDER — METHYLNALTREXONE BROMIDE 12 MG/0.6ML ~~LOC~~ SOLN
12.0000 mg | Freq: Once | SUBCUTANEOUS | Status: AC
  Administered 2020-03-08: 12 mg via SUBCUTANEOUS
  Filled 2020-03-08: qty 0.6

## 2020-03-08 NOTE — Progress Notes (Signed)
Agitated through day in attempts to wean sedation.   +Cough, -Gag,  +Corneals, +Spontaneous eye opening. Unable to Follow commands. No Response to painful/tactile stimuli.     Dysynchronous w/MV.  Unable to tolerate PS/CPAP - transitioned back to [facility].   C/W Fentanyl, Precedex, Levophed gtt titrations.  Diuresing well s/p Albumin and Lasix - 4L in 12 hrs  Temps fluctuating through shift, Tmax 37.7-35.7)  - Acetaminophen, Icepacks and Bairhugger utilized   No BM: paused TF, 200 residual-rust colored, resumed at 20 per orders and communication w/MD  Reviewed progress w/Dr. Tamala Julian- see orders  Peggy-Family at bedside through day, reviewed POC and updated

## 2020-03-08 NOTE — Progress Notes (Signed)
Discussed with Nurse Baron Sane, apparently not following commands even with sedation wean and becomes dyssynchronous with vent.  I think we may need to consider an MRI in AM and get some objective information regarding anoxic damage.  Erskine Emery MD PCCM

## 2020-03-08 NOTE — Progress Notes (Signed)
Assisted to tele visit with family member

## 2020-03-08 NOTE — Progress Notes (Signed)
NAME:  Kimberly Howell, MRN:  [id number], DOB:  1945-12-10, LOS: 5 ADMISSION DATE:  03/03/2020, CONSULTATION DATE:  03/08/20 REFERRING MD:  EDP, CHIEF COMPLAINT:  Cardiac arrest   Brief History   74 year old female with past medical history of chronic respiratory failure with COPD on 3 L home O2 and CPAP nightly, stage IV CKD, hypertension, morbid obesity who had a cardiac arrest with >45 minutes CPR before ROSC then lost pulses three more times.  Intubated and PCCM consulted for admission  History of present illness   Kimberly Howell is a 74 year old female with past medical history significant for chronic respiratory failure with COPD on 3 L home O2 and CPAP nightly, stage IV CKD, hypertension, morbid obesity.  Her son lives with her and states she had resumed her CPAP last night, he was up in the night with her around 1 AM and she seemed fine.  Several hours later he heard the sound of her falling in the bathroom.  He found her face down and unresponsive and did not think she was breathing so called 911 and started chest compressions.  When EMS arrived CPR performed for 45-50 minutes before ROSC achieved.  Per report, patient lost pulses 3 more times and ended to the ED with CPR in progress.    CT head negative for intracranial bleed, L lamina papyracea fracture with herniation of the L medial rectus muscle.  Labs significant for creatinine 2.08, elevated LFTs AST 392, ALT 247.  She has been unresponsive in the ED with sedation. PCCM consulted for admission  Past Medical History  Morbid obesity with BMI of 50.0-59.9, adult ([place])   Chronic respiratory failure (HCC)   CKD (chronic kidney disease) stage 3, GFR 30-59 ml/min (HCC)   Essential hypertension   OSA on CPAP  Significant Hospital Events   9/18 Admit to PCCM  Consults:  Ophthamology  Procedures:  9/18 ETT  Significant Diagnostic Tests:  CT head/C-spine/Maxillofacial>>Small left frontal scalp hematoma without skull  fracture. Fracture of the left lamina papyracea with herniation of the left medial rectus muscle into the ethmoid sinus. Small amount of retro bulbar stranding, likely hemorrhage.  Right lung apex consolidation concerning for pneumonia. EEG 9/20 > diffuse encephalopathy  Micro Data:  9/18 Sars-CoV-2>>negative  Antimicrobials:     Interim history/subjective:  No events. Still awaiting SAT. Did not diurese much unfortunately.  Objective   Blood pressure (!) 108/53, pulse 68, temperature 99.3 F (37.4 C), resp. rate (!) 24, height 5\' 4"  (1.626 m), weight (!) 136.8 kg, SpO2 100 %.    Vent Mode: PRVC FiO2 (%):  [40 %] 40 % Set Rate:  [22 bmp] 22 bmp Vt Set:  [380 mL] 380 mL PEEP:  [5 cmH20] 5 cmH20 Plateau Pressure:  [22 cmH20-30 cmH20] 27 cmH20   Intake/Output Summary (Last 24 hours) at 03/08/2020 0724 Last data filed at 03/08/2020 0700 Gross per 24 hour  Intake 2593.06 ml  Output 1685 ml  Net 908.06 ml   Filed Weights   03/06/20 0600 03/07/20 0245 03/08/20 0500  Weight: (!) 145.4 kg (!) 139.6 kg (!) 136.8 kg     Constitutional: obese woman in no acute distress Eyes: eyes are anicteric, reactive to light Ears, nose, mouth, and throat: mucous membranes moist, trachea midline Cardiovascular: heart sounds are regular, ext are warm to touch. 2+ edema Respiratory: diminished at bases, wheezing at apices, triggering vent Gastrointestinal: abdomen is soft with + BS Skin: No rashes, normal turgor Neurologic: wincing to pain, eyes are  fluttering, still on 146mcg/hr of fentanyl Psychiatric: RASS -4  Net +0.9L H/H stable WBC stable Plts stable BUN up, Cr stable    Assessment & Plan:    Cardiac arrest with hypoxic and hypercarbic respiratory failure Patient with prolonged downtime.  Greater than 45 minutes. Presumed PEA cardiac arrest.  Had 4 separate events with ROSC obtained.- EKG without signs of ischemia.  Patient has been unsteady on her feet recently, unclear inciting  event for multiple arrests - Wean from fentanyl to precedex - Continue daily SATs as able - Avoid fevers  Hypotension - presumed 2/2 sedation plus lingering sepsis - Continue low dose levophed as needed to maintain goal MAP > 65  Periorbital fracture with hyphema - Herniation of the left medial rectus muscle into the sinus with small amount of retro bulbar stranding. - Ophthalmology consulted 9/20 incase any interventions required, nothing for now.  They will follow peripherally unless pt wakes up and has visual changes / pain / etc.  Upper lobe pneumonia, likely aspiration during cardiac event. No recent symptoms per son, no leukocytosis or fever - Continue Unasyn x 7 days  Type 2 diabetes - SSI with CBGs  AKI on CKD- oliguric, ATN related to cardiac arrest HFpEF, hypertension - Echo Grade 1 diastolic dysfunction. Creatinine near baseline of 1.7-1.8 (patient has duplicate MRN) - Albumin+lasix challenge today - Strict I/O - High risk for needing HD  Constipation - Sorbitol, dulcolax, dose of relistor   Best practice:  Diet: TFs Pain/Anxiety/Delirium protocol (if indicated): try for RASS 0 today VAP protocol (if indicated): HOB 30 degrees, suction as needed DVT prophylaxis: SCDs, heparin GI prophylaxis: Protonix Glucose control: SSI Mobility: Bedrest Code Status: Full code Family Communication: Updated son at bedside 9/22 Disposition: ICU   The patient is critically ill with multiple organ systems failure and requires high complexity decision making for assessment and support, frequent evaluation and titration of therapies, application of advanced monitoring technologies and extensive interpretation of multiple databases. Critical Care Time devoted to patient care services described in this note independent of APP/resident time (if applicable)  is 37 minutes.   Erskine Emery MD [facility] 03/08/2020 7:24 AM Personal pager: [phone] If unanswered, please  page CCM On-call: [phone]

## 2020-03-09 ENCOUNTER — Inpatient Hospital Stay (HOSPITAL_COMMUNITY): Payer: Medicare HMO

## 2020-03-09 ENCOUNTER — Ambulatory Visit: Payer: Medicare HMO | Admitting: Adult Health

## 2020-03-09 ENCOUNTER — Encounter (HOSPITAL_COMMUNITY): Payer: Self-pay | Admitting: Pulmonary Disease

## 2020-03-09 DIAGNOSIS — J9601 Acute respiratory failure with hypoxia: Secondary | ICD-10-CM | POA: Diagnosis not present

## 2020-03-09 LAB — BASIC METABOLIC PANEL WITH GFR
Anion gap: 13 (ref 5–15)
BUN: 77 mg/dL — ABNORMAL HIGH (ref 8–23)
CO2: 35 mmol/L — ABNORMAL HIGH (ref 22–32)
Calcium: 8.1 mg/dL — ABNORMAL LOW (ref 8.9–10.3)
Chloride: 99 mmol/L (ref 98–111)
Creatinine, Ser: 3.02 mg/dL — ABNORMAL HIGH (ref 0.44–1.00)
GFR calc Af Amer: 17 mL/min — ABNORMAL LOW
GFR calc non Af Amer: 15 mL/min — ABNORMAL LOW
Glucose, Bld: 148 mg/dL — ABNORMAL HIGH (ref 70–99)
Potassium: 4.1 mmol/L (ref 3.5–5.1)
Sodium: 147 mmol/L — ABNORMAL HIGH (ref 135–145)

## 2020-03-09 LAB — GLUCOSE, CAPILLARY
Glucose-Capillary: 129 mg/dL — ABNORMAL HIGH (ref 70–99)
Glucose-Capillary: 140 mg/dL — ABNORMAL HIGH (ref 70–99)
Glucose-Capillary: 170 mg/dL — ABNORMAL HIGH (ref 70–99)
Glucose-Capillary: 121 mg/dL — ABNORMAL HIGH (ref 70–99)

## 2020-03-09 LAB — CBC
HCT: 24 % — ABNORMAL LOW (ref 36.0–46.0)
Hemoglobin: 7.1 g/dL — ABNORMAL LOW (ref 12.0–15.0)
MCH: 29.3 pg (ref 26.0–34.0)
MCHC: 29.6 g/dL — ABNORMAL LOW (ref 30.0–36.0)
MCV: 99.2 fL (ref 80.0–100.0)
Platelets: 230 10*3/uL (ref 150–400)
RBC: 2.42 MIL/uL — ABNORMAL LOW (ref 3.87–5.11)
RDW: 14.3 % (ref 11.5–15.5)
WBC: 11.7 10*3/uL — ABNORMAL HIGH (ref 4.0–10.5)
nRBC: 0.3 % — ABNORMAL HIGH (ref 0.0–0.2)

## 2020-03-09 LAB — MAGNESIUM: Magnesium: 2.1 mg/dL (ref 1.7–2.4)

## 2020-03-09 MED ORDER — FUROSEMIDE 80 MG PO TABS
80.0000 mg | ORAL_TABLET | Freq: Every day | ORAL | Status: DC
  Administered 2020-03-10: 80 mg
  Filled 2020-03-09: qty 1

## 2020-03-09 MED ORDER — FUROSEMIDE 80 MG PO TABS
80.0000 mg | ORAL_TABLET | Freq: Every day | ORAL | Status: DC
  Administered 2020-03-09: 80 mg via ORAL
  Filled 2020-03-09: qty 1

## 2020-03-09 MED ORDER — METOLAZONE 5 MG PO TABS
5.0000 mg | ORAL_TABLET | Freq: Once | ORAL | Status: AC
  Administered 2020-03-09: 5 mg via ORAL
  Filled 2020-03-09: qty 1

## 2020-03-09 MED ORDER — PROPOFOL 1000 MG/100ML IV EMUL
5.0000 ug/kg/min | INTRAVENOUS | Status: DC
  Administered 2020-03-09 (×2): 20 ug/kg/min via INTRAVENOUS
  Administered 2020-03-09: 25 ug/kg/min via INTRAVENOUS
  Administered 2020-03-10 (×5): 30 ug/kg/min via INTRAVENOUS
  Administered 2020-03-11 – 2020-03-12 (×6): 40 ug/kg/min via INTRAVENOUS
  Administered 2020-03-12: 10 ug/kg/min via INTRAVENOUS
  Administered 2020-03-12 – 2020-03-13 (×2): 15 ug/kg/min via INTRAVENOUS
  Filled 2020-03-09 (×2): qty 200
  Filled 2020-03-09 (×4): qty 100
  Filled 2020-03-09: qty 200
  Filled 2020-03-09 (×5): qty 100
  Filled 2020-03-09: qty 200
  Filled 2020-03-09 (×3): qty 100
  Filled 2020-03-09: qty 200

## 2020-03-09 NOTE — Plan of Care (Signed)
  Problem: Clinical Measurements: Goal: Will remain free from infection Outcome: Progressing Goal: Cardiovascular complication will be avoided Outcome: Progressing   Problem: Activity: Goal: Risk for activity intolerance will decrease Outcome: Progressing   Problem: Coping: Goal: Level of anxiety will decrease Outcome: Progressing   Problem: Elimination: Goal: Will not experience complications related to bowel motility Outcome: Progressing   Problem: Pain Managment: Goal: General experience of comfort will improve Outcome: Progressing   Problem: Safety: Goal: Ability to remain free from injury will improve Outcome: Progressing   Problem: Skin Integrity: Goal: Risk for impaired skin integrity will decrease Outcome: Progressing   Problem: Health Behavior/Discharge Planning: Goal: Ability to manage health-related needs will improve Outcome: Not Progressing   Problem: Clinical Measurements: Goal: Ability to maintain clinical measurements within normal limits will improve Outcome: Not Progressing Goal: Diagnostic test results will improve Outcome: Not Progressing Goal: Respiratory complications will improve Outcome: Not Progressing   Problem: Nutrition: Goal: Adequate nutrition will be maintained Outcome: Not Progressing   Problem: Elimination: Goal: Will not experience complications related to urinary retention Outcome: Not Progressing   Problem: Activity: Goal: Ability to tolerate increased activity will improve Outcome: Not Progressing   Problem: Respiratory: Goal: Ability to maintain a clear airway and adequate ventilation will improve Outcome: Not Progressing

## 2020-03-09 NOTE — Progress Notes (Signed)
NAME:  Kimberly Howell, MRN:  [id number], DOB:  01-31-1946, LOS: 6 ADMISSION DATE:  03/03/2020, CONSULTATION DATE:  03/09/20 REFERRING MD:  EDP, CHIEF COMPLAINT:  Cardiac arrest   Brief History   74 year old female with past medical history of chronic respiratory failure with COPD on 3 L home O2 and CPAP nightly, stage IV CKD, hypertension, morbid obesity who had a cardiac arrest with >45 minutes CPR before ROSC then lost pulses three more times.  Intubated and PCCM consulted for admission  History of present illness   Kimberly Howell is a 74 year old female with past medical history significant for chronic respiratory failure with COPD on 3 L home O2 and CPAP nightly, stage IV CKD, hypertension, morbid obesity.  Her son lives with her and states she had resumed her CPAP last night, he was up in the night with her around 1 AM and she seemed fine.  Several hours later he heard the sound of her falling in the bathroom.  He found her face down and unresponsive and did not think she was breathing so called 911 and started chest compressions.  When EMS arrived CPR performed for 45-50 minutes before ROSC achieved.  Per report, patient lost pulses 3 more times and ended to the ED with CPR in progress.    CT head negative for intracranial bleed, L lamina papyracea fracture with herniation of the L medial rectus muscle.  Labs significant for creatinine 2.08, elevated LFTs AST 392, ALT 247.  She has been unresponsive in the ED with sedation. PCCM consulted for admission  Past Medical History  Morbid obesity with BMI of 50.0-59.9, adult ([place])   Chronic respiratory failure (HCC)   CKD (chronic kidney disease) stage 3, GFR 30-59 ml/min (HCC)   Essential hypertension   OSA on CPAP  Significant Hospital Events   9/18 Admit to PCCM  Consults:  Ophthamology  Procedures:  9/18 ETT  Significant Diagnostic Tests:  CT head/C-spine/Maxillofacial>>Small left frontal scalp hematoma without skull  fracture. Fracture of the left lamina papyracea with herniation of the left medial rectus muscle into the ethmoid sinus. Small amount of retro bulbar stranding, likely hemorrhage.  Right lung apex consolidation concerning for pneumonia. EEG 9/20 > diffuse encephalopathy  Micro Data:  9/18 Sars-CoV-2>>negative  Antimicrobials:     Interim history/subjective:  During turning became dyssynchronous, desaturated, and bradycardic.  Sedation turned up high.  Now slowly coming down.  Opening eyes and moving but not sure if to command.  Objective   Blood pressure (!) 147/60, pulse (!) 58, temperature 99 F (37.2 C), resp. rate (!) 22, height 5\' 8"  (1.727 m), weight 135 kg, SpO2 100 %.    Vent Mode: PRVC FiO2 (%):  [40 %] 40 % Set Rate:  [22 bmp] 22 bmp Vt Set:  [380 mL] 380 mL PEEP:  [5 cmH20] 5 cmH20 Pressure Support:  [10 cmH20] 10 cmH20 Plateau Pressure:  [21 cmH20-29 cmH20] 29 cmH20   Intake/Output Summary (Last 24 hours) at 03/09/2020 0839 Last data filed at 03/09/2020 0800 Gross per 24 hour  Intake 2156.03 ml  Output 6585 ml  Net -4428.97 ml   Filed Weights   03/07/20 0245 03/08/20 0500 03/09/20 0430  Weight: (!) 139.6 kg (!) 136.8 kg 135 kg     Constitutional: obese woman in no acute distress Eyes: eyes are anicteric, reactive to light Ears, nose, mouth, and throat: mucous membranes moist, trachea midline Cardiovascular: heart sounds are regular, ext are warm to touch. 1+ edema improved Respiratory: diminished  at bases, less wheezing, triggers vent Gastrointestinal: abdomen is soft with + BS Skin: No rashes, normal turgor Neurologic: moves BL upper ext and LLE, I cannot get her to do this consistently to command, she has cough/gag/corneals.  She localizes to pain. Psychiatric: RASS -1  Net -3.5L Stable anemia Improved leukocytosis Sugars okay   Assessment & Plan:    Cardiac arrest with hypoxic and hypercarbic respiratory failure Patient with prolonged downtime.   Greater than 45 minutes. Presumed PEA cardiac arrest.  Had 4 separate events with ROSC obtained.- EKG without signs of ischemia.  Patient has been unsteady on her feet recently, unclear inciting event for multiple arrests - Wean sedation, if still not following commands will need MRI/EEG and consideration for neurology cosnult - Avoid fevers  Hypotension - presumed 2/2 sedation plus lingering sepsis - Continue low dose levophed as needed to maintain goal MAP > 65  Periorbital fracture with hyphema - Herniation of the left medial rectus muscle into the sinus with small amount of retro bulbar stranding. - Ophthalmology consulted 9/20 incase any interventions required, nothing for now.  They will follow peripherally unless pt wakes up and has visual changes / pain / etc.  Upper lobe pneumonia, likely aspiration during cardiac event. No recent symptoms per son, no leukocytosis or fever - Continue Unasyn x 7 days  Type 2 diabetes - SSI with CBGs  AKI on CKD- oliguric, ATN related to cardiac arrest HFpEF, hypertension - Echo Grade 1 diastolic dysfunction. Creatinine near baseline of 1.7-1.8 (patient has duplicate MRN) Did well with lasix/albumin/metolazone challenge, BUN may limit [place] further - Switch to PO lasix and metolazone - Strict I/O  Constipation - Improved, rectal tube in place   Best practice:  Diet: TFs Pain/Anxiety/Delirium protocol (if indicated): try for RASS 0 today VAP protocol (if indicated): HOB 30 degrees, suction as needed DVT prophylaxis: SCDs, heparin GI prophylaxis: Protonix Glucose control: SSI Mobility: Bedrest Code Status: Full code Family Communication: Updated son at bedside 9/22 Disposition: ICU   The patient is critically ill with multiple organ systems failure and requires high complexity decision making for assessment and support, frequent evaluation and titration of therapies, application of advanced monitoring technologies and extensive  interpretation of multiple databases. Critical Care Time devoted to patient care services described in this note independent of APP/resident time (if applicable)  is 39 minutes.   Erskine Emery MD [facility] 03/09/2020 8:39 AM Personal pager: [phone] If unanswered, please page CCM On-call: [phone]

## 2020-03-09 NOTE — Progress Notes (Signed)
Around 0400 while beginning to reposition patient, patient became agitated, started coughing, dropping oxygen saturation, and becoming bradycardic. RN titrated and bolused sedation per order in order for patient to become more comfortable and stabilize. Defib pads were also placed as a precautionary measure. Patient stabilized with increased sedation.

## 2020-03-09 NOTE — Progress Notes (Signed)
Transported pt to MRI with RN at bedside.  Pt tolerated well

## 2020-03-10 DIAGNOSIS — I469 Cardiac arrest, cause unspecified: Secondary | ICD-10-CM | POA: Diagnosis not present

## 2020-03-10 LAB — BASIC METABOLIC PANEL
Anion gap: 14 (ref 5–15)
BUN: 81 mg/dL — ABNORMAL HIGH (ref 8–23)
CO2: 33 mmol/L — ABNORMAL HIGH (ref 22–32)
Calcium: 8.5 mg/dL — ABNORMAL LOW (ref 8.9–10.3)
Chloride: 101 mmol/L (ref 98–111)
Creatinine, Ser: 3.09 mg/dL — ABNORMAL HIGH (ref 0.44–1.00)
GFR calc Af Amer: 17 mL/min — ABNORMAL LOW (ref >60)
GFR calc non Af Amer: 14 mL/min — ABNORMAL LOW (ref >60)
Glucose, Bld: 116 mg/dL — ABNORMAL HIGH (ref 70–99)
Potassium: 4.2 mmol/L (ref 3.5–5.1)
Sodium: 148 mmol/L — ABNORMAL HIGH (ref 135–145)

## 2020-03-10 LAB — GLUCOSE, CAPILLARY
Glucose-Capillary: 101 mg/dL — ABNORMAL HIGH (ref 70–99)
Glucose-Capillary: 101 mg/dL — ABNORMAL HIGH (ref 70–99)
Glucose-Capillary: 103 mg/dL — ABNORMAL HIGH (ref 70–99)
Glucose-Capillary: 107 mg/dL — ABNORMAL HIGH (ref 70–99)
Glucose-Capillary: 108 mg/dL — ABNORMAL HIGH (ref 70–99)
Glucose-Capillary: 109 mg/dL — ABNORMAL HIGH (ref 70–99)

## 2020-03-10 LAB — PREPARE RBC (CROSSMATCH)

## 2020-03-10 LAB — MAGNESIUM: Magnesium: 2.4 mg/dL (ref 1.7–2.4)

## 2020-03-10 MED ORDER — FENTANYL CITRATE (PF) 100 MCG/2ML IJ SOLN
25.0000 ug | INTRAMUSCULAR | Status: DC | PRN
  Administered 2020-03-16: 25 ug via INTRAVENOUS
  Administered 2020-03-17 – 2020-03-18 (×2): 50 ug via INTRAVENOUS
  Filled 2020-03-10 (×4): qty 2

## 2020-03-10 MED ORDER — SODIUM CHLORIDE 0.9% IV SOLUTION: Freq: Once | INTRAVENOUS | Status: DC

## 2020-03-10 MED ORDER — SCOPOLAMINE 1 MG/3DAYS TD PT72
1.0000 | MEDICATED_PATCH | TRANSDERMAL | Status: DC
  Administered 2020-03-10 – 2020-03-19 (×4): 1.5 mg via TRANSDERMAL
  Filled 2020-03-10 (×4): qty 1

## 2020-03-10 NOTE — Consult Note (Signed)
NEURO HOSPITALIST CONSULT NOTE   Requesting physician: Dr. Tamala Julian  Reason for consult: Mental status assessment  History obtained from: Chart  HPI:                                                                                                                                          Kimberly Howell is an 74 y.o. female with a past medical history significant for COPD with chronic oxygen dependent respiratory failure, CHF, type II DM, stage III CKD, morbid obesity who presented to the hospital on 18Sep2021 following a fall where she was found face-down and pulseless. Her son began CPR, EMS continued and achieved ROSC after ~15 minutes. EMS lost pulses again en route, she received 2-2 doses of epinepherine and CPR was in progress on arrival. ROSC was again achieved in the ED. She has been intubated since her ED arrival.   Pertinent Medications Propofol Scopalamine SS Insulin Levophed Lasix Unasyn  Pertinent Imaging/Diagnostics MRI brain: Hypoxic/ischemic injury through the cerebellum, medial temporal lobes, two possible punctate infarct in the paramedian right frontal lobe, medially displaced acute fracture of the left lamina papyracea, displaced fracture of the left fovea ethmoidalis. Local mass effect of orbital fat on anteroinferal left frontal lobe. EEG 19Sep2021: generalized irregular slow activity  Allergies  Allergen Reactions  . Prednisone Other (See Comments)    Increases blood sugar    MEDICATIONS:                                                                                                                   Current Meds  Medication Sig  . acetaminophen (TYLENOL) 325 MG tablet Take 650 mg by mouth every 6 (six) hours as needed for moderate pain.  Marland Kitchen albuterol (VENTOLIN HFA) 108 (90 Base) MCG/ACT inhaler Inhale 2 puffs into the lungs every 6 (six) hours as needed for wheezing or shortness of breath.  Marland Kitchen amLODipine (NORVASC) 2.5 MG tablet Take 2.5 mg by  mouth daily.  Marland Kitchen aspirin 81 MG EC tablet Take 81 mg by mouth daily. Swallow whole.  . carvedilol (COREG) 25 MG tablet Take 25 mg by mouth 2 (two) times daily with a meal.  . cetirizine (ZYRTEC) 10 MG tablet Take 10 mg by mouth daily.  . colchicine 0.6 MG tablet Take 0.6  mg by mouth daily.  . diclofenac Sodium (VOLTAREN) 1 % GEL Apply 2 g topically 4 (four) times daily.  Marland Kitchen esomeprazole (NEXIUM) 40 MG capsule Take 40 mg by mouth daily at 12 noon.  . febuxostat (ULORIC) 40 MG tablet Take 80 mg by mouth daily.   . ferrous sulfate 325 (65 FE) MG tablet Take 325 mg by mouth 2 (two) times daily with a meal.  . furosemide (LASIX) 20 MG tablet Take 20 mg by mouth.  Marland Kitchen glipiZIDE (GLUCOTROL XL) 2.5 MG 24 hr tablet Take 2.5 mg by mouth daily with breakfast.  . HYDROcodone-acetaminophen (NORCO) 10-325 MG tablet Take 1 tablet by mouth every 6 (six) hours as needed.  . linagliptin (TRADJENTA) 5 MG TABS tablet Take 5 mg by mouth daily.  Marland Kitchen lisinopril (ZESTRIL) 40 MG tablet Take 40 mg by mouth daily.  . meclizine (ANTIVERT) 25 MG tablet Take 25 mg by mouth daily.  . methocarbamol (ROBAXIN) 750 MG tablet Take 750 mg by mouth daily.   . Multiple Vitamins-Minerals (CENTRAVITES 50 PLUS) TABS Take 1 tablet by mouth daily.  . pramipexole (MIRAPEX) 0.125 MG tablet Take 0.125 mg by mouth 3 (three) times daily.  . pravastatin (PRAVACHOL) 40 MG tablet Take 40 mg by mouth daily.  . saxagliptin HCl (ONGLYZA) 2.5 MG TABS tablet Take 2.5 mg by mouth daily.  . [DISCONTINUED] traMADol (ULTRAM) 50 MG tablet Take 50 mg by mouth every 6 (six) hours as needed.     Review Of Systems: Level V Caveat                                                                                                      Blood pressure 139/60, pulse 86, temperature (!) 100.8 F (38.2 C), resp. rate (!) 22, height 5\' 8"  (1.727 m), weight 133.4 kg, SpO2 100 %.   Physical Examination:                                                                                                       General: WD, obese female. Eyes closed, in bed HEENT:  Normocephalic, no lesions, without obvious abnormality.  Normal external eye. Small amounts of stranding left subconjunctival hemorrhage. Normal external ears and nose. Cardiovascular: RRR Pulmonary: Ventilated  Abdomen: obese, soft Skin: warm and dry, no hyperpigmentation, vitiligo, or suspicious lesions  Neurological Examination:  Mental Status: Kimberly Howell is difficult to rouse, intubated. Once roused, she looked at me as I requested, but did not follow other commands. Cranial Nerves: II: Pupils are equal, round, reactive to light. Dysconjugate gaze, with left eye preference to the right. III,IV, VI: Right eye: extra-ocular muscle movements intact across the horizontal plane.  V,VII: Face appears symmetric VIII: Hearing grossly intact IX,X: Gag reflex present XI: Unable not assess XII: Intubated Motor: Withdraw from pain on right ring finger Sensory: Grimace to pain on left ring finger Deep Tendon Reflexes: 1+ and symmetric throughout uppers, dropped lowers Plantars: Mute bilaterally  Cerebellar: Unable to assess Proprioception: Unable to assess Gait: Unable to assess   Lab Results: Basic Metabolic Panel: Recent Labs  Lab 03/04/20 0441 03/04/20 0441 03/04/20 0535 03/05/20 1836 03/06/20 0752 03/06/20 0752 03/06/20 1730 03/07/20 0418 03/07/20 0418 03/08/20 0330 03/09/20 0421 03/10/20 0435  NA 144   < >   < >  --  143  --   --  145  --  145 147* 148*  K 5.0   < >   < >  --  4.4  --   --  4.1  --  4.2 4.1 4.2  CL 97*   < >  --   --  99  --   --  101  --  99 99 101  CO2 32   < >  --   --  31  --   --  31  --  31 35* 33*  GLUCOSE 91   < >  --   --  145*  --   --  205*  --  225* 148* 116*  BUN 47*   < >  --   --  57*  --   --  63*  --  74* 77* 81*  CREATININE 2.95*   < >  --   --  3.14*  --    --  3.19*  --  3.08* 3.02* 3.09*  CALCIUM 8.3*   < >  --   --  7.6*   < >  --  7.4*   < > 7.4* 8.1* 8.5*  MG 2.3   < >   < > 2.3 2.2  --  2.2 2.2  --   --  2.1 2.4  PHOS 2.9  --   --  4.4 4.8*  --  3.6 3.6  --   --   --   --    < > = values in this interval not displayed.    Liver Function Tests: Recent Labs  Lab 03/06/20 0752  AST 90*  ALT 118*  ALKPHOS 80  BILITOT 0.8  PROT 6.2*  ALBUMIN 2.3*   No results for input(s): LIPASE, AMYLASE in the last 168 hours. No results for input(s): AMMONIA in the last 168 hours.  CBC: Recent Labs  Lab 03/06/20 0752 03/06/20 0752 03/07/20 0418 03/07/20 1942 03/08/20 0330 03/09/20 0421 03/10/20 0435  WBC 17.3*  --  13.6*  --  14.5* 11.7* 12.4*  NEUTROABS 14.9*  --   --   --   --   --   --   HGB 8.6*   < > 7.3* 7.2* 7.4* 7.1* 6.8*  HCT 29.0*   < > 24.7* 24.8* 25.3* 24.0* 23.0*  MCV 99.7  --  98.4  --  100.4* 99.2 99.6  PLT 244  --  199  --  219 230 221   < > =  values in this interval not displayed.    Cardiac Enzymes: No results for input(s): CKTOTAL, CKMB, CKMBINDEX, TROPONINI in the last 168 hours.  Lipid Panel: Recent Labs  Lab 03/04/20 0441 03/05/20 0106 03/05/20 1836  TRIG 141 148 195*    CBG: Recent Labs  Lab 03/09/20 1140 03/09/20 2033 03/10/20 0010 03/10/20 0426 03/10/20 0747  GLUCAP 170* 121* 103* 101* 109*    Microbiology: Results for orders placed or performed during the hospital encounter of 03/03/20  SARS Coronavirus 2 by RT PCR (hospital order, performed in [facility] hospital lab) Nasopharyngeal Nasopharyngeal Swab     Status: None   Collection Time: 03/03/20  6:14 AM   Specimen: Nasopharyngeal Swab  Result Value Ref Range Status   SARS Coronavirus 2 NEGATIVE NEGATIVE Final    Comment: (NOTE) SARS-CoV-2 target nucleic acids are NOT DETECTED.  The SARS-CoV-2 RNA is generally detectable in upper and lower respiratory specimens during the acute phase of infection. The lowest concentration of  SARS-CoV-2 viral copies this assay can detect is 250 copies / mL. A negative result does not preclude SARS-CoV-2 infection and should not be used as the sole basis for treatment or other patient management decisions.  A negative result may occur with improper specimen collection / handling, submission of specimen other than nasopharyngeal swab, presence of viral mutation(s) within the areas targeted by this assay, and inadequate number of viral copies (<250 copies / mL). A negative result must be combined with clinical observations, patient history, and epidemiological information.  Fact Sheet for Patients:   StrictlyIdeas.no  Fact Sheet for Healthcare Providers: BankingDealers.co.za  This test is not yet approved or  cleared by the [place] FDA and has been authorized for detection and/or diagnosis of SARS-CoV-2 by FDA under an Emergency Use Authorization (EUA).  This EUA will remain in effect (meaning this test can be used) for the duration of the COVID-19 declaration under Section 564(b)(1) of the Act, 21 U.S.C. section 360bbb-3(b)(1), unless the authorization is terminated or revoked sooner.  Performed at [facility], [place] [address] [place], [place] [address]   MRSA PCR Screening     Status: Abnormal   Collection Time: 03/03/20 10:56 AM   Specimen: Nasal Mucosa; Nasopharyngeal  Result Value Ref Range Status   MRSA by PCR POSITIVE (A) NEGATIVE Final    Comment:        The GeneXpert MRSA Assay (FDA approved for NASAL specimens only), is one component of a comprehensive MRSA colonization surveillance program. It is not intended to diagnose MRSA infection nor to guide or monitor treatment for MRSA infections. RESULT CALLED TO, READ BACK BY AND VERIFIED WITH: A,COLLERAN @1258  03/03/20 EB Performed at [facility] [address] [place], [place] [address]     Coagulation Studies: No results for input(s):  LABPROT, INR in the last 72 hours.  Imaging: DG Abd 1 View  Result Date: 03/09/2020 CLINICAL DATA:  Orogastric tube positioning EXAM: ABDOMEN - 1 VIEW COMPARISON:  March 07, 2020 FINDINGS: Orogastric tube tip and side port in mid to distal stomach. No bowel dilatation or air-fluid level to suggest bowel obstruction. No free air. Bibasilar lung atelectasis. IMPRESSION: Nasogastric tube tip and side port in stomach. No demonstrable bowel obstruction or free air on supine examination. Electronically Signed   By: Lowella Grip III M.D.   On: 03/09/2020 08:09   MR BRAIN WO CONTRAST  Result Date: 03/09/2020 CLINICAL DATA:  Provided INDICATION: Anoxic brain damage. EXAM: MRI HEAD  WITHOUT CONTRAST TECHNIQUE: Multiplanar, multiecho pulse sequences of the brain and surrounding structures were obtained without intravenous contrast. COMPARISON:  CT head/maxillofacial 03/03/2020. FINDINGS: Brain: Cerebral volume is normal for age. In addition to the acute medially displaced fracture of the left lamina papyracea described on the maxillofacial CT of 03/03/2020, there is also an acute displaced fracture of the left fovea ethmoidalis. Herniation of orbital fat and of the left medial rectus muscle into the left ethmoid sinus. Additionally, orbital fat extends from the left ethmoid sinus through the fracture defect in the fovea ethmoidalis intracranially into the left anterior cranial fossa. The orbital fat extending intracranially measures 0.9 x 1.0 cm and exerts slight mass effect upon the anteroinferior left frontal lobe (for instance as seen on series 8, image 23) (series 4, image 15). There are small foci of apparent diffusion weighted hyperintensity within the paramedian frontoparietal lobes. These predominantly appear to reflect susceptibility artifact from adjacent prominent dural calcifications. However, two punctate acute infarcts within the paramedian right frontal lobe are difficult to exclude (series 2,  images 43 and 35). There is probable subtle diffuse diffusion-weighted signal abnormality throughout the cerebellum. Subtle symmetric diffusion-weighted signal abnormality is also questioned within the medial temporal lobes/hippocampi (for instance as seen on series 3, image 17). These findings are highly suspicious for hypoxic/ischemic injury. Mild multifocal T2/FLAIR hyperintensity within the cerebral white matter is nonspecific, but consistent with chronic small vessel ischemic disease. No definite chronic intracranial blood products are identified. No evidence of intracranial mass. No extra-axial fluid collection. No midline shift. Vascular: Expected proximal arterial flow voids. Skull and upper cervical spine: No focal marrow lesion. Sinuses/Orbits: Visualized orbits show no acute finding. Mucosal thickening and air-fluid level within the left maxillary sinus. Herniation of the left medial rectus muscle and extensive herniation of orbital fat into the left ethmoid sinuses. Mild ethmoid and right maxillary sinus mucosal thickening. Large bilateral mastoid effusions. Other: Redemonstrated frontal scalp and left maxillofacial soft tissue swelling/hematoma. These results will be called to the ordering clinician or representative by the Radiologist Assistant, and communication documented in the PACS or [employer]. IMPRESSION: Probable subtle diffuse diffusion-weighted signal abnormality throughout the cerebellum. Subtle symmetric diffusion-weighted signal abnormality is also questioned within the medial temporal lobes/hippocampi. These findings are highly suspicious for hypoxic/ischemic injury. Small foci of apparent diffusion weighted hyperintensity within the paramedian frontoparietal lobes appear to predominant reflect susceptibility artifact from adjacent dural calcification. However, two punctate acute infarcts within the paramedian right frontal lobe are difficult to exclude. Medially displaced acute  fracture of the left lamina papyracea. There is also an acute, displaced fracture of the left fovea ethmoidalis. Orbital fat and the medial rectus muscle extend into the left ethmoid sinus. Additionally, orbital fat extends from the left ethmoid sinus through the fracture defect in the fovea ethmoidalis intracranially into the left anterior cranial fossa. The intracranial component of orbital fat measures 1 cm and exerts mild local mass effect upon the anteroinferior left frontal lobe. Mild cerebral white matter chronic small vessel ischemic disease. Electronically Signed   By: Kellie Simmering DO   On: 03/09/2020 18:14     Thank you for consulting the [facility] team. Assessment and plan per attending neurologist.   Solon Augusta PA-C [facility] [phone]  03/10/2020, 10:51 AM  Assessment and Plan:

## 2020-03-10 NOTE — Progress Notes (Signed)
eLink Physician-Brief Progress Note Patient Name: Kimberly Howell DOB: 01-Jun-1946 MRN: [id number]   Date of Service  03/10/2020  HPI/Events of Note  Hemoglobin 6.8 gm / dl  eICU Interventions  1 unit PRBC ordered transfused.        Windy Dudek U Demarius Archila 03/10/2020, 6:18 AM

## 2020-03-10 NOTE — Progress Notes (Signed)
CRITICAL VALUE ALERT  Critical Value:  HGB 6.8  Date & Time Notied:  03/10/2020 0550   Provider Notified: Warren Lacy    Orders Received/Actions taken: Awaiting orders

## 2020-03-10 NOTE — Progress Notes (Signed)
NAME:  Kimberly Howell, MRN:  [id number], DOB:  December 12, 1945, LOS: 7 ADMISSION DATE:  03/03/2020, CONSULTATION DATE:  03/10/20 REFERRING MD:  EDP, CHIEF COMPLAINT:  Cardiac arrest   Brief History   74 year old female with past medical history of chronic respiratory failure with COPD on 3 L home O2 and CPAP nightly, stage IV CKD, hypertension, morbid obesity who had a cardiac arrest with >45 minutes CPR before ROSC then lost pulses three more times.  Intubated and PCCM consulted for admission  History of present illness   Kimberly Howell is a 74 year old female with past medical history significant for chronic respiratory failure with COPD on 3 L home O2 and CPAP nightly, stage IV CKD, hypertension, morbid obesity.  Her son lives with her and states she had resumed her CPAP last night, he was up in the night with her around 1 AM and she seemed fine.  Several hours later he heard the sound of her falling in the bathroom.  He found her face down and unresponsive and did not think she was breathing so called 911 and started chest compressions.  When EMS arrived CPR performed for 45-50 minutes before ROSC achieved.  Per report, patient lost pulses 3 more times and ended to the ED with CPR in progress.    CT head negative for intracranial bleed, L lamina papyracea fracture with herniation of the L medial rectus muscle.  Labs significant for creatinine 2.08, elevated LFTs AST 392, ALT 247.  She has been unresponsive in the ED with sedation. PCCM consulted for admission  Past Medical History  Morbid obesity with BMI of 50.0-59.9, adult ([place])   Chronic respiratory failure (HCC)   CKD (chronic kidney disease) stage 3, GFR 30-59 ml/min (HCC)   Essential hypertension   OSA on CPAP  Significant Hospital Events   9/18 Admit to PCCM  Consults:  Ophthamology  Procedures:  9/18 ETT  Significant Diagnostic Tests:  CT head/C-spine/Maxillofacial>>Small left frontal scalp hematoma without skull  fracture. Fracture of the left lamina papyracea with herniation of the left medial rectus muscle into the ethmoid sinus. Small amount of retro bulbar stranding, likely hemorrhage.  Right lung apex consolidation concerning for pneumonia. EEG 9/20 > diffuse encephalopathy  Micro Data:  9/18 Sars-CoV-2>>negative  Antimicrobials:     Interim history/subjective:  No issues. Sedated. Equivocal response to voice and command remains.  Objective   Blood pressure 139/60, pulse 90, temperature (!) 100.8 F (38.2 C), resp. rate 19, height 5\' 8"  (1.727 m), weight 133.4 kg, SpO2 100 %.    Vent Mode: PRVC FiO2 (%):  [40 %] 40 % Set Rate:  [22 bmp] 22 bmp Vt Set:  [380 mL] 380 mL PEEP:  [5 cmH20] 5 cmH20 Plateau Pressure:  [14 cmH20-29 cmH20] 27 cmH20   Intake/Output Summary (Last 24 hours) at 03/10/2020 0930 Last data filed at 03/10/2020 0900 Gross per 24 hour  Intake 828.35 ml  Output 2130 ml  Net -1301.65 ml   Filed Weights   03/08/20 0500 03/09/20 0430 03/10/20 0100  Weight: (!) 136.8 kg 135 kg 133.4 kg     Constitutional: obese woman in no acute distress Eyes: eyes are anicteric, reactive to light Ears, nose, mouth, and throat: mucous membranes moist, trachea midline Cardiovascular: heart sounds are regular, ext are warm to touch. 1+ edema improved Respiratory: diminished at bases, less wheezing, triggers vent Gastrointestinal: abdomen is soft with + BS Skin: No rashes, normal turgor Neurologic: moves BL upper ext and LLE, I cannot get  her to do this consistently to command, she has cough/gag/corneals.  She localizes to pain. Psychiatric: RASS -1  Net -1.5L Hgb slightly lower, fine for 1 unit Leukocytosis stable Sugars okay   Assessment & Plan:    Cardiac arrest with hypoxic and hypercarbic respiratory failure Patient with prolonged downtime.  Greater than 45 minutes. Presumed PEA cardiac arrest.  Had 4 separate events with ROSC obtained.- EKG without signs of ischemia.   Patient has been unsteady on her feet recently, unclear inciting event for multiple arrests - MRI and physicial exam fall into a grey zone for neurological recovery, I am going to ask neurology to help with prognostication and discussions with family - Avoid fevers  Hypotension - presumed 2/2 sedation plus lingering sepsis - Continue low dose levophed as needed to maintain goal MAP > 65  Periorbital fracture with hyphema - Herniation of the left medial rectus muscle into the sinus with small amount of retro bulbar stranding. - Ophthalmology consulted 9/20 incase any interventions required, nothing for now.  They will follow peripherally unless pt wakes up and has visual changes / pain / etc.  Upper lobe pneumonia, likely aspiration during cardiac event. No recent symptoms per son, no leukocytosis or fever - Continue Unasyn x 7 days  Type 2 diabetes - SSI with CBGs  AKI on CKD- oliguric, ATN related to cardiac arrest HFpEF, hypertension - Echo Grade 1 diastolic dysfunction. Creatinine near baseline of 1.7-1.8 (patient has duplicate MRN) Did well with lasix/albumin/metolazone challenge, BUN may limit [place] further - Continue PO lasix and metolazone - Strict I/O  Constipation - Improved, rectal tube in place   Best practice:  Diet: TFs Pain/Anxiety/Delirium protocol (if indicated): in place VAP protocol (if indicated): HOB 30 degrees, suction as needed DVT prophylaxis: SCDs, heparin GI prophylaxis: Protonix Glucose control: SSI Mobility: Bedrest Code Status: Full code Family Communication: Updated son at bedside 9/22, may need to arrange family meeting soon to discuss LTACH vs. comfort Disposition: ICU   The patient is critically ill with multiple organ systems failure and requires high complexity decision making for assessment and support, frequent evaluation and titration of therapies, application of advanced monitoring technologies and extensive interpretation of multiple  databases. Critical Care Time devoted to patient care services described in this note independent of APP/resident time (if applicable)  is 37 minutes.   Erskine Emery MD [facility] 03/10/2020 9:30 AM Personal pager: [phone] If unanswered, please page CCM On-call: [phone]

## 2020-03-11 DIAGNOSIS — Z515 Encounter for palliative care: Secondary | ICD-10-CM | POA: Diagnosis not present

## 2020-03-11 DIAGNOSIS — J9600 Acute respiratory failure, unspecified whether with hypoxia or hypercapnia: Secondary | ICD-10-CM

## 2020-03-11 DIAGNOSIS — I469 Cardiac arrest, cause unspecified: Secondary | ICD-10-CM | POA: Diagnosis not present

## 2020-03-11 DIAGNOSIS — J9601 Acute respiratory failure with hypoxia: Secondary | ICD-10-CM | POA: Diagnosis not present

## 2020-03-11 DIAGNOSIS — A419 Sepsis, unspecified organism: Secondary | ICD-10-CM | POA: Diagnosis not present

## 2020-03-11 LAB — BASIC METABOLIC PANEL
Anion gap: 16 — ABNORMAL HIGH (ref 5–15)
BUN: 84 mg/dL — ABNORMAL HIGH (ref 8–23)
CO2: 33 mmol/L — ABNORMAL HIGH (ref 22–32)
Calcium: 8.6 mg/dL — ABNORMAL LOW (ref 8.9–10.3)
Chloride: 100 mmol/L (ref 98–111)
Creatinine, Ser: 3.39 mg/dL — ABNORMAL HIGH (ref 0.44–1.00)
GFR calc Af Amer: 15 mL/min — ABNORMAL LOW (ref >60)
GFR calc non Af Amer: 13 mL/min — ABNORMAL LOW (ref >60)
Glucose, Bld: 111 mg/dL — ABNORMAL HIGH (ref 70–99)
Potassium: 3.5 mmol/L (ref 3.5–5.1)
Sodium: 149 mmol/L — ABNORMAL HIGH (ref 135–145)

## 2020-03-11 LAB — CBC
HCT: 23 % — ABNORMAL LOW (ref 36.0–46.0)
HCT: 23.9 % — ABNORMAL LOW (ref 36.0–46.0)
Hemoglobin: 6.8 g/dL — CL (ref 12.0–15.0)
Hemoglobin: 7.1 g/dL — ABNORMAL LOW (ref 12.0–15.0)
MCH: 29.4 pg (ref 26.0–34.0)
MCH: 28.7 pg (ref 26.0–34.0)
MCHC: 29.6 g/dL — ABNORMAL LOW (ref 30.0–36.0)
MCHC: 29.7 g/dL — ABNORMAL LOW (ref 30.0–36.0)
MCV: 99.6 fL (ref 80.0–100.0)
MCV: 96.8 fL (ref 80.0–100.0)
Platelets: 221 10*3/uL (ref 150–400)
Platelets: 211 10*3/uL (ref 150–400)
RBC: 2.31 MIL/uL — ABNORMAL LOW (ref 3.87–5.11)
RBC: 2.47 MIL/uL — ABNORMAL LOW (ref 3.87–5.11)
RDW: 14.5 % (ref 11.5–15.5)
RDW: 15.8 % — ABNORMAL HIGH (ref 11.5–15.5)
WBC: 12.4 10*3/uL — ABNORMAL HIGH (ref 4.0–10.5)
WBC: 12.1 10*3/uL — ABNORMAL HIGH (ref 4.0–10.5)
nRBC: 0.2 % (ref 0.0–0.2)
nRBC: 0 % (ref 0.0–0.2)

## 2020-03-11 LAB — GLUCOSE, CAPILLARY
Glucose-Capillary: 101 mg/dL — ABNORMAL HIGH (ref 70–99)
Glucose-Capillary: 102 mg/dL — ABNORMAL HIGH (ref 70–99)
Glucose-Capillary: 103 mg/dL — ABNORMAL HIGH (ref 70–99)
Glucose-Capillary: 103 mg/dL — ABNORMAL HIGH (ref 70–99)
Glucose-Capillary: 112 mg/dL — ABNORMAL HIGH (ref 70–99)
Glucose-Capillary: 123 mg/dL — ABNORMAL HIGH (ref 70–99)

## 2020-03-11 LAB — MAGNESIUM: Magnesium: 2.4 mg/dL (ref 1.7–2.4)

## 2020-03-11 NOTE — Consult Note (Signed)
Consultation Note Date: 03/11/2020   Patient Name: Kimberly Howell  DOB: 22-Feb-1946  MRN: [id number]  Age / Sex: 74 y.o., female  PCP: Kimberly Carol, MD Referring Physician: Candee Furbish, MD  Reason for Consultation: Establishing goals of care  HPI/Patient Profile: 74 y.o. female  with past medical history of chronic respiratory failure on 3L at home and CPAP at night, she was followed by Dr. Valeta Howell of Pulmonary, stage IV CKD, morbid obesity, who was admitted on 03/03/2020 after cardiac arrest. Her son Kimberly Howell her her fall and initiated CPR. Per notes it took greater than 45 min to achieve ROSC and she lost pulses 3 more times in transport to the hospital.  She was intubated in the ER.  She had right lung pneumonia and sepsis.  LFTs were elevated, creatinine elevated, and has remained hypotensive and sedated while intubated.   Clinical Assessment and Goals of Care:  I have reviewed medical records including EPIC notes, labs and imaging, received report from Dr. Tamala Julian, examined the patient and spoke with her son Kimberly Howell on the phone to introduce Palliative and get a better understanding of the patients goals and values.    I introduced Palliative Medicine as specialized medical care for people living with serious illness. It focuses on providing relief from the symptoms and stress of a serious illness.   We discussed a brief life review of the patient. Per Kimberly Howell she is a "hard working woman".  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  She has 2 children, a 23 yo mother who is still alive, 3 sisters, 1 brother.  She was divorced 20+ years ago.  She attends the [employer] and loved to sing in the [place].  She loves food, singing and being with her family.  Kimberly Howell comments that the family trusts in [place] and they are "not letting go".  Kimberly Howell talks about his  mother's health over the past year.  She had a mini seizure and had been having the jitters.  He described her feeling cloudy and having difficulty with stuttering for the past year.  The doctor tried different medications to help.  She is currently on mirapex - a type of parkinsons medication.  Kimberly Howell describes her being on oxygen for the past 3-4 years.  Kimberly Howell heard his mother hit the floor when she fell.  He initiated CPR and stated he watched blood gush from her head - it's a difficult image to manage thru.    We discussed the meeting tomorrow.  Kimberly Howell and the patient's two sisters Kimberly Howell and Kimberly Howell will be present.  I explained that going forward her life will be quite different than it was before.  It's important to try to understand what she would want rather than to just push forward.  These meetings can be quite hard but the goal is to come out of it with a plan we can all feel good about because it is what she would have  wanted.  Kimberly Howell understood.   Questions and concerns were addressed.  The family was encouraged to call with questions or concerns.      Primary Decision Maker:  NEXT OF KIN Kimberly Howell, Kimberly Howell and Kimberly Howell    SUMMARY OF RECOMMENDATIONS    CCM & PMT meeting with family on Monday at 2:00 pm. Hard choices need to be made between extubating and allowing her body to move forward as nature allows vs continuing aggressive measures and moving forward with tracheostomy and long term facility placement.  Code Status/Advance Care Planning:  Full   Symptom Management:   Per primary.  Currently requiring propofol and still slightly agitated on the vent.  Additional Recommendations (Limitations, Scope, Preferences):  Full Scope Treatment  Palliative Prophylaxis:   Frequent Pain Assessment  Psycho-social/Spiritual:   Desire for further Chaplaincy support: requested for 2:00 meeting Monday.   Prognosis: unable to determine.  Discharge Planning: To Be  Determined      Primary Diagnoses: Present on Admission: . Acute respiratory failure ([place])   I have reviewed the medical record, interviewed the patient and family, and examined the patient. The following aspects are pertinent.  History reviewed. No pertinent past medical history. Social History   Socioeconomic History  . Marital status: Married    Spouse name: Not on file  . Number of children: Not on file  . Years of education: Not on file  . Highest education level: Not on file  Occupational History  . Not on file  Tobacco Use  . Smoking status: Not on file  Substance and Sexual Activity  . Alcohol use: Not on file  . Drug use: Not on file  . Sexual activity: Not on file  Other Topics Concern  . Not on file  Social History Narrative  . Not on file   Social Determinants of Health   Financial Resource Strain:   . Difficulty of Paying Living Expenses: Not on file  Food Insecurity:   . Worried About Charity fundraiser in the Last Year: Not on file  . Ran Out of Food in the Last Year: Not on file  Transportation Needs:   . Lack of Transportation (Medical): Not on file  . Lack of Transportation (Non-Medical): Not on file  Physical Activity:   . Days of Exercise per Week: Not on file  . Minutes of Exercise per Session: Not on file  Stress:   . Feeling of Stress : Not on file  Social Connections:   . Frequency of Communication with Friends and Family: Not on file  . Frequency of Social Gatherings with Friends and Family: Not on file  . Attends Religious Services: Not on file  . Active Member of Clubs or Organizations: Not on file  . Attends Archivist Meetings: Not on file  . Marital Status: Not on file   No family history on file.  Allergies  Allergen Reactions  . Prednisone Other (See Comments)    Increases blood sugar    Physical Exam pleasant appearing female, obese, sedated and asleep, a bit agitated with the ETT in her mouth.  Vital  Signs: BP (!) 117/55   Pulse 75   Temp 100.2 F (37.9 C)   Resp (!) 22   Ht 5\' 8"  (1.727 m)   Wt 134.5 kg   SpO2 96%   BMI 45.09 kg/m  Pain Scale: CPOT   Pain Score: Asleep   SpO2: SpO2: 96 % O2 Device:SpO2: 96 % O2 Flow Rate: .  Palliative Assessment/Data: 10%     Time In: 1:20 Time Out: 2:11 Time Total: 51 min. Visit consisted of counseling and education dealing with the complex and emotionally intense issues surrounding the need for palliative care and symptom management in the setting of serious and potentially life-threatening illness. Greater than 50%  of this time was spent counseling and coordinating care related to the above assessment and plan.  Signed by: Florentina Jenny, PA-C Palliative Medicine  Please contact Palliative Medicine Team phone at [phone] for questions and concerns.  For individual provider: See Shea Evans

## 2020-03-11 NOTE — Progress Notes (Signed)
Call received by radiology-Dr. Armandina Gemma regarding the CT and MRI read of the orbital fracture with concerned that it might be extending into the skull base and might make patient susceptible for basal meningitis. I relayed my conversation with radiology to Dr. Erskine Emery of PCCM. Prior to making any decisions on her long-term prognosis, possibility of a CNS infection and treatment must be explored.  Excerpt from report Medially displaced acute fracture of the left lamina papyracea. There is also an acute, displaced fracture of the left fovea ethmoidalis. Orbital fat and the medial rectus muscle extend into the left ethmoid sinus. Additionally, orbital fat extends from the left ethmoid sinus through the fracture defect in the fovea ethmoidalis intracranially into the left anterior cranial fossa. The intracranial component of orbital fat measures 1 cm and exerts mild local mass effect upon the anteroinferior left frontal lobe.  -- Amie Portland, MD [facility] Pager: [phone] If 7pm to 7am, please call on call as listed on AMION.

## 2020-03-11 NOTE — Progress Notes (Signed)
NAME:  Kimberly Howell, MRN:  [id number], DOB:  Mar 09, 1946, LOS: 8 ADMISSION DATE:  03/03/2020, CONSULTATION DATE:  03/11/20 REFERRING MD:  EDP, CHIEF COMPLAINT:  Cardiac arrest   Brief History   74 year old female with past medical history of chronic respiratory failure with COPD on 3 L home O2 and CPAP nightly, stage IV CKD, hypertension, morbid obesity who had a cardiac arrest with >45 minutes CPR before ROSC then lost pulses three more times.  Intubated and PCCM consulted for admission  History of present illness   Kimberly Howell is a 74 year old female with past medical history significant for chronic respiratory failure with COPD on 3 L home O2 and CPAP nightly, stage IV CKD, hypertension, morbid obesity.  Her son lives with her and states she had resumed her CPAP last night, he was up in the night with her around 1 AM and she seemed fine.  Several hours later he heard the sound of her falling in the bathroom.  He found her face down and unresponsive and did not think she was breathing so called 911 and started chest compressions.  When EMS arrived CPR performed for 45-50 minutes before ROSC achieved.  Per report, patient lost pulses 3 more times and ended to the ED with CPR in progress.    CT head negative for intracranial bleed, L lamina papyracea fracture with herniation of the L medial rectus muscle.  Labs significant for creatinine 2.08, elevated LFTs AST 392, ALT 247.  She has been unresponsive in the ED with sedation. PCCM consulted for admission  Past Medical History  Morbid obesity with BMI of 50.0-59.9, adult ([facility])   Chronic respiratory failure (HCC)   CKD (chronic kidney disease) stage 3, GFR 30-59 ml/min (HCC)   Essential hypertension   OSA on CPAP  Significant Hospital Events   9/18 Admit to PCCM  Consults:  Ophthamology  Procedures:  9/18 ETT  Significant Diagnostic Tests:  CT head/C-spine/Maxillofacial>>Small left frontal scalp hematoma without skull  fracture. Fracture of the left lamina papyracea with herniation of the left medial rectus muscle into the ethmoid sinus. Small amount of retro bulbar stranding, likely hemorrhage.  Right lung apex consolidation concerning for pneumonia. EEG 9/20 > diffuse encephalopathy  Micro Data:  9/18 Sars-CoV-2>>negative  Antimicrobials:     Interim history/subjective:  No issues. Off sedation. Does turn head to voice.  Objective   Blood pressure (!) 107/50, pulse 71, temperature 99.1 F (37.3 C), resp. rate 20, height 5\' 8"  (1.727 m), weight 134.5 kg, SpO2 97 %. CVP:  [16 mmHg] 16 mmHg  Vent Mode: PRVC FiO2 (%):  [40 %] 40 % Set Rate:  [22 bmp] 22 bmp Vt Set:  [380 mL] 380 mL PEEP:  [5 cmH20] 5 cmH20 Plateau Pressure:  [19 cmH20-26 cmH20] 25 cmH20   Intake/Output Summary (Last 24 hours) at 03/11/2020 0930 Last data filed at 03/11/2020 0700 Gross per 24 hour  Intake 1066.51 ml  Output 2380 ml  Net -1313.49 ml   Filed Weights   03/09/20 0430 03/10/20 0100 03/11/20 0432  Weight: 135 kg 133.4 kg 134.5 kg     Constitutional: obese woman in no acute distress Eyes: eyes are anicteric, reactive to light Ears, nose, mouth, and throat: mucous membranes moist, trachea midline Cardiovascular: heart sounds are regular, ext are warm to touch. 1+ edema improved Respiratory: diminished at bases, triggers vent Gastrointestinal: abdomen is soft with + BS Skin: No rashes, normal turgor Neurologic: moves BL upper ext and LLE, I cannot get  her to do this consistently to command, she has cough/gag/corneals.  She localizes to pain. Psychiatric: RASS 0, roving gaze question blindness  Net -1.3L Hgb remains low despite 1 unit Leukocytosis stable Sugars okay   Assessment & Plan:    Cardiac arrest with hypoxic and hypercarbic respiratory failure Patient with prolonged downtime.  Greater than 45 minutes. Presumed PEA cardiac arrest.  Had 4 separate events with ROSC obtained.- EKG without signs of  ischemia.  Patient has been unsteady on her feet recently, unclear inciting event for multiple arrests - MRI and physicial exam fall into a grey zone for neurological recovery, I am going to ask neurology to help with prognostication and discussions with family - Avoid fevers  Hypotension - presumed 2/2 sedation plus lingering sepsis - Continue low dose levophed as needed to maintain goal MAP > 65  Periorbital fracture with hyphema - Herniation of the left medial rectus muscle into the sinus with small amount of retro bulbar stranding. - Ophthalmology consulted 9/20 incase any interventions required, nothing for now.  They will follow peripherally unless pt wakes up and has visual changes / pain / etc.  Upper lobe pneumonia, likely aspiration during cardiac event. No recent symptoms per son, no leukocytosis or fever - Continue Unasyn x 7 days  Type 2 diabetes - SSI with CBGs  AKI on CKD- oliguric, ATN related to cardiac arrest HFpEF, hypertension - Echo Grade 1 diastolic dysfunction. Creatinine near baseline of 1.7-1.8 (patient has duplicate MRN) Did well with lasix/albumin/metolazone challenge, BUN may limit [place] further - Hold further diuretics today - Strict I/O  Constipation - Improved, rectal tube in place   Best practice:  Diet: TFs Pain/Anxiety/Delirium protocol (if indicated): in place VAP protocol (if indicated): HOB 30 degrees, suction as needed DVT prophylaxis: SCDs, heparin GI prophylaxis: Protonix Glucose control: SSI Mobility: Bedrest Code Status: Full code Family Communication: Will arrange family meeting tomorrow to see where to go, options would be (A) comfort care, (B) extubation.  If extubated would need to know if they would want her reintubated and trach'd.  Overall they need to know that best case she will be in an [facility] for months.  I think they need to know she is not a candidate for hemodialysis. Disposition: ICU   The patient is critically ill with  multiple organ systems failure and requires high complexity decision making for assessment and support, frequent evaluation and titration of therapies, application of advanced monitoring technologies and extensive interpretation of multiple databases. Critical Care Time devoted to patient care services described in this note independent of APP/resident time (if applicable)  is 39 minutes.   Erskine Emery MD [facility] 03/11/2020 9:30 AM Personal pager: [phone] If unanswered, please page CCM On-call: [phone]

## 2020-03-12 DIAGNOSIS — Z515 Encounter for palliative care: Secondary | ICD-10-CM | POA: Diagnosis not present

## 2020-03-12 DIAGNOSIS — J9601 Acute respiratory failure with hypoxia: Secondary | ICD-10-CM | POA: Diagnosis not present

## 2020-03-12 DIAGNOSIS — G9340 Encephalopathy, unspecified: Secondary | ICD-10-CM | POA: Diagnosis not present

## 2020-03-12 DIAGNOSIS — I469 Cardiac arrest, cause unspecified: Secondary | ICD-10-CM | POA: Diagnosis not present

## 2020-03-12 LAB — BASIC METABOLIC PANEL
Anion gap: 14 (ref 5–15)
BUN: 80 mg/dL — ABNORMAL HIGH (ref 8–23)
CO2: 33 mmol/L — ABNORMAL HIGH (ref 22–32)
Calcium: 8.7 mg/dL — ABNORMAL LOW (ref 8.9–10.3)
Chloride: 100 mmol/L (ref 98–111)
Creatinine, Ser: 3.12 mg/dL — ABNORMAL HIGH (ref 0.44–1.00)
GFR calc Af Amer: 17 mL/min — ABNORMAL LOW (ref >60)
GFR calc non Af Amer: 14 mL/min — ABNORMAL LOW (ref >60)
Glucose, Bld: 132 mg/dL — ABNORMAL HIGH (ref 70–99)
Potassium: 3.5 mmol/L (ref 3.5–5.1)
Sodium: 147 mmol/L — ABNORMAL HIGH (ref 135–145)

## 2020-03-12 LAB — CSF CELL COUNT WITH DIFFERENTIAL
RBC Count, CSF: 0 /mm3
Tube #: 4
WBC, CSF: 4 /mm3 (ref 0–5)

## 2020-03-12 LAB — CBC
HCT: 25.2 % — ABNORMAL LOW (ref 36.0–46.0)
Hemoglobin: 7.4 g/dL — ABNORMAL LOW (ref 12.0–15.0)
MCH: 28.9 pg (ref 26.0–34.0)
MCHC: 29.4 g/dL — ABNORMAL LOW (ref 30.0–36.0)
MCV: 98.4 fL (ref 80.0–100.0)
Platelets: 236 10*3/uL (ref 150–400)
RBC: 2.56 MIL/uL — ABNORMAL LOW (ref 3.87–5.11)
RDW: 15.6 % — ABNORMAL HIGH (ref 11.5–15.5)
WBC: 10.5 10*3/uL (ref 4.0–10.5)
nRBC: 0.4 % — ABNORMAL HIGH (ref 0.0–0.2)

## 2020-03-12 LAB — TYPE AND SCREEN
ABO/RH(D): B POS
Antibody Screen: NEGATIVE
Unit division: 0

## 2020-03-12 LAB — GLUCOSE, CAPILLARY
Glucose-Capillary: 105 mg/dL — ABNORMAL HIGH (ref 70–99)
Glucose-Capillary: 108 mg/dL — ABNORMAL HIGH (ref 70–99)
Glucose-Capillary: 112 mg/dL — ABNORMAL HIGH (ref 70–99)
Glucose-Capillary: 127 mg/dL — ABNORMAL HIGH (ref 70–99)
Glucose-Capillary: 143 mg/dL — ABNORMAL HIGH (ref 70–99)
Glucose-Capillary: 156 mg/dL — ABNORMAL HIGH (ref 70–99)

## 2020-03-12 LAB — MAGNESIUM: Magnesium: 2.5 mg/dL — ABNORMAL HIGH (ref 1.7–2.4)

## 2020-03-12 LAB — PROTEIN AND GLUCOSE, CSF
Glucose, CSF: 83 mg/dL — ABNORMAL HIGH (ref 40–70)
Total  Protein, CSF: 78 mg/dL — ABNORMAL HIGH (ref 15–45)

## 2020-03-12 LAB — BPAM RBC
Blood Product Expiration Date: 202110172359
ISSUE DATE / TIME: 202109250943
Unit Type and Rh: 7300

## 2020-03-12 LAB — PROCALCITONIN: Procalcitonin: 0.62 ng/mL

## 2020-03-12 MED ORDER — SODIUM CHLORIDE 0.9 % IV SOLN
2.0000 g | Freq: Two times a day (BID) | INTRAVENOUS | Status: DC
  Administered 2020-03-12 – 2020-03-13 (×2): 2 g via INTRAVENOUS
  Filled 2020-03-12: qty 20
  Filled 2020-03-12 (×2): qty 2

## 2020-03-12 MED ORDER — POTASSIUM CHLORIDE 20 MEQ/15ML (10%) PO SOLN
20.0000 meq | Freq: Once | ORAL | Status: AC
  Administered 2020-03-12: 20 meq
  Filled 2020-03-12: qty 15

## 2020-03-12 MED ORDER — ETOMIDATE 2 MG/ML IV SOLN
20.0000 mg | Freq: Once | INTRAVENOUS | Status: AC
  Administered 2020-03-13: 20 mg via INTRAVENOUS
  Filled 2020-03-12: qty 10

## 2020-03-12 MED ORDER — FREE WATER
200.0000 mL | Status: DC
  Administered 2020-03-12 – 2020-03-16 (×11): 200 mL

## 2020-03-12 MED ORDER — VITAL HIGH PROTEIN PO LIQD: 1000.0000 mL | ORAL | Status: DC

## 2020-03-12 MED ORDER — VANCOMYCIN HCL 10 G IV SOLR
2500.0000 mg | Freq: Once | INTRAVENOUS | Status: AC
  Administered 2020-03-12: 2500 mg via INTRAVENOUS
  Filled 2020-03-12: qty 2500

## 2020-03-12 MED ORDER — VANCOMYCIN HCL 1750 MG/350ML IV SOLN: 1750.0000 mg | INTRAVENOUS | Status: DC

## 2020-03-12 MED ORDER — HEPARIN SODIUM (PORCINE) 5000 UNIT/ML IJ SOLN
5000.0000 [IU] | Freq: Three times a day (TID) | INTRAMUSCULAR | Status: DC
  Administered 2020-03-13 – 2020-03-17 (×9): 5000 [IU] via SUBCUTANEOUS
  Filled 2020-03-12 (×9): qty 1

## 2020-03-12 MED ORDER — FENTANYL CITRATE (PF) 100 MCG/2ML IJ SOLN
200.0000 ug | Freq: Once | INTRAMUSCULAR | Status: AC
  Administered 2020-03-13: 100 ug via INTRAVENOUS
  Filled 2020-03-12: qty 4

## 2020-03-12 MED ORDER — VECURONIUM BROMIDE 10 MG IV SOLR
10.0000 mg | Freq: Once | INTRAVENOUS | Status: AC
  Administered 2020-03-13: 10 mg via INTRAVENOUS
  Filled 2020-03-12: qty 10

## 2020-03-12 MED ORDER — MIDAZOLAM HCL 2 MG/2ML IJ SOLN
5.0000 mg | Freq: Once | INTRAMUSCULAR | Status: AC
  Administered 2020-03-13: 4 mg via INTRAVENOUS
  Filled 2020-03-12: qty 6

## 2020-03-12 NOTE — Progress Notes (Signed)
Nutrition Follow-up  DOCUMENTATION CODES:   Morbid obesity  INTERVENTION:   Continue tube feeding via OG tube: - Vital High Protein @ 60 ml/hr (1440 ml/day) - Free water per CCM, currently 200 ml q 4 hours  Tube feeding regimen and current free water provides 1440 kcal, 126 grams of protein, and 1204 ml of H2O.   Tube feeding regimen and current propofol provides 1762 total kcal (100% of needs).  NUTRITION DIAGNOSIS:   Inadequate oral intake related to acute illness as evidenced by NPO status.  Ongoing  GOAL:   Patient will meet greater than or equal to 90% of their needs  Met via TF  MONITOR:   Vent status, TF tolerance, Labs, Weight trends  REASON FOR ASSESSMENT:   Consult Enteral/tube feeding initiation and management  ASSESSMENT:   74 yo female admitted post cardiac arrest requiring 45-50 minutes of CPR before ROSC achieved followed by loss of pulse 3 more times; pt intubated and sedated; also with periorbital fracture, AKI.  PMH includes COPD on home oxygen, stage IV CKD, HTN, morbid obesity .  Discussed pt with RN and during ICU rounds.  Noted plan for family meeting today at 2:00 pm. There is concern for anoxic injury. Per notes, given pt's MRI/CT findings, also concern that her orbital fracture might be extending into the skull base thus making her more susceptible for basal meningitis. Possibility of CNS infection must be considered.  OG tube in place with TF infusing.  Current TF: Vital High Protein @ 40 ml/hr, free water 200 ml q 4 hours  Admit weight: 140.8 kg Current weight: 133.7 kg (will use as EDW)  Pt with +1 pitting edema to BUE, BLE, and face.  Patient is currently intubated on ventilator support MV: 10.7 L/min Temp (24hrs), Avg:99.4 F (37.4 C), Min:97.3 F (36.3 C), Max:100.6 F (38.1 C) BP (cuff): 123/61 MAP (cuff): 80  Drips: Propofol: 12.2 ml/hr (provides 322 kcal daily from lipid)  Medications reviewed and include: dulcolax,  colace, SSI q 4 hours, levemir 12 units BID, protonix, miralax, sorbitol  Labs reviewed: sodium 147, BUN 80, creatinine 3.12, magnesium 2.5, hemoglobin 7.4 CBG's: 101-127 x 24 hours  UOP: 2150 ml x 24 hours I/O's: -1.3 L since admit  Diet Order:   Diet Order            Diet NPO time specified  Diet effective now                 EDUCATION NEEDS:   Not appropriate for education at this time  Skin:  Skin Assessment: Reviewed RN Assessment (MASD to pelvis)  Last BM:  03/12/20 medium type 7  Height:   Ht Readings from Last 1 Encounters:  03/08/20 _0  (1.727 m)    Weight:   Wt Readings from Last 1 Encounters:  03/12/20 133.7 kg    Ideal Body Weight:  54.5 kg  BMI:  Body mass index is 44.82 kg/m.  Estimated Nutritional Needs:   Kcal:  9470-9628  Protein:  110-135 grams  Fluid:  >/= 1.5 L    Gaynell Face, MS, RD, LDN Inpatient Clinical Dietitian Please see AMiON for contact information.

## 2020-03-12 NOTE — Procedures (Signed)
Lumbar Puncture Procedure Note  Kimberly Howell  [id number]  12/17/1945  Date:03/12/20  Time:4:54 PM   Provider Performing:Nhung Danko C Tamala Julian   Procedure: Lumbar Puncture (75449)  Indication(s) Rule out meningitis  Consent Risks of the procedure as well as the alternatives and risks of each were explained to the patient and/or caregiver.  Consent for the procedure was obtained and is signed in the bedside chart  Anesthesia Topical only with 1% lidocaine    Time Out Verified patient identification, verified procedure, site/side was marked, verified correct patient position, special equipment/implants available, medications/allergies/relevant history reviewed, required imaging and test results available.   Sterile Technique Maximal sterile technique including sterile barrier drape, hand hygiene, sterile gown, sterile gloves, mask, hair covering.    Procedure Description Using palpation, approximate location of L3-L4 space identified.   Lidocaine used to anesthetize skin and subcutaneous tissue overlying this area.  A 20g spinal needle was then used to access the subarachnoid space. Opening pressure:Not obtained. Closing pressure:Not obtained. 4 mL clear CSF obtained.  Complications/Tolerance None; patient tolerated the procedure well.   EBL Minimal   Specimen(s) CSF

## 2020-03-12 NOTE — Progress Notes (Signed)
Daily Progress Note   Patient Name: Kimberly Howell       Date: 03/12/2020 DOB: [birth year]  Age: 74 y.o. MRN#: [id number] Attending Physician: Candee Furbish, MD Primary Care Physician: Seward Carol, MD Admit Date: 03/03/2020  Reason for Consultation/Follow-up:  To discuss complex medical decision making related to patient's goals of care  Discussed with ICU RN Gabe.  Planning meeting with family (sons - [name] and Lawler, and sisters - [name] and Belenda Cruise) as well as CCM MD and Palliative Chaplain.  Subjective: Dr. Tamala Julian lead the discussion by gaining an understanding of how the family thought patient was doing and appropriate next steps. Next steps including tracheostomy, PEG, and lumbar puncture were discussed.   Patient's worsening kidney function and  inability to wean from the ventilator were discussed.  The family, particularly [name] and [name] were eager to move forward with continued interventions.  They felt the patient would want to continue to fight.   LTAC facility was mentioned.  I expressed concern that Ms. Lensing has a fragile foundation given her history of heart issues, lung issues, and kidney issues.  If she is able to eventually come off the ventilator she will likely be too debilitated to ever walk again.  She will likely live in a facility for the rest of her life.  We discussed code status.  Dr. Tamala Julian was direct and explained that should her heart ever stop - nothing the medical team could do would be beneficial.  If her heart stopped she would be at end of life.   He also mentioned that if her kidney function continues to deteriorate that may change our plans.  LP will likely be done today or tomorrow.  Tracheostomy will be done in the morning and PEG (feeding tube)  will be placed by IR later this week.  Transitions of Care Team will assist the family with placing the patient in an LTAC or vent SNF facility.  Contact information for the Palliative time was provided and the family was encouraged to call with any questions.    Assessment: 55 yof admitted after cardiac/respiratory arrest.  Now with uncertain degree of anoxic brain injury, unable to wean from the vent, acute on chronic kidney disease.  More awake and following some commands today.   Patient Profile/HPI:  74 y.o. female  with past medical history of chronic respiratory failure on 3L at home and CPAP at night, she was followed by Dr. Valeta Harms of Pulmonary, stage IV CKD, morbid obesity, who was admitted on 03/03/2020 after cardiac arrest. Her son [name] heard her fall and initiated CPR. Per notes it took greater than 45 min to achieve ROSC and she lost pulses 3 more times in transport to the hospital.  She was intubated in the ER.  She had right lung pneumonia and sepsis.  LFTs were elevated, creatinine elevated, and has remained hypotensive and sedated while intubated.   Length of Stay: 9   Vital Signs: BP 115/65   Pulse 89   Temp (!) 101.1 F (38.4 C)   Resp (!) 23   Ht 5\' 8"  (1.727 m)   Wt 133.7 kg   SpO2 96%   BMI 44.82 kg/m  SpO2: SpO2: 96 % O2 Device: O2 Device: Ventilator O2 Flow Rate:         Palliative Assessment/Data: 10%     Palliative Care Plan    Recommendations/Plan:  PMT will continue to follow with you.  Plan is to move forward with Trach/Peg and lumbar puncture.  Code Status:  DNR  Prognosis:   Unable to determine   Discharge Planning:  To Be Determined, likely LTAC or Vent SNF  Care plan was discussed with Family, CCM, PMT Chaplain.  Thank you for allowing the Palliative Medicine Team to assist in the care of this patient.  Total time spent:  35 min.     Greater than 50%  of this time was spent counseling and coordinating care related to the  above assessment and plan.  Florentina Jenny, PA-C Palliative Medicine  Please contact Palliative MedicineTeam phone at [phone] for questions and concerns between 7 am - 7 pm.   Please see AMION for individual provider pager numbers.

## 2020-03-12 NOTE — Progress Notes (Signed)
NAME:  Kimberly Howell, MRN:  [id number], DOB:  02/26/1946, LOS: 9 ADMISSION DATE:  03/03/2020, CONSULTATION DATE:  03/12/20 REFERRING MD:  EDP, CHIEF COMPLAINT:  Cardiac arrest   Brief History   74 year old female with past medical history of chronic respiratory failure with COPD on 3 L home O2 and CPAP nightly, stage IV CKD, hypertension, morbid obesity who had a cardiac arrest with >45 minutes CPR before ROSC then lost pulses three more times.  Intubated and PCCM consulted for admission  History of present illness   Kimberly Howell is a 74 year old female with past medical history significant for chronic respiratory failure with COPD on 3 L home O2 and CPAP nightly, stage IV CKD, hypertension, morbid obesity.  Her son lives with her and states she had resumed her CPAP last night, he was up in the night with her around 1 AM and she seemed fine.  Several hours later he heard the sound of her falling in the bathroom.  He found her face down and unresponsive and did not think she was breathing so called 911 and started chest compressions.  When EMS arrived CPR performed for 45-50 minutes before ROSC achieved.  Per report, patient lost pulses 3 more times and ended to the ED with CPR in progress.    CT head negative for intracranial bleed, L lamina papyracea fracture with herniation of the L medial rectus muscle.  Labs significant for creatinine 2.08, elevated LFTs AST 392, ALT 247.  She has been unresponsive in the ED with sedation. PCCM consulted for admission  Past Medical History  Morbid obesity with BMI of 50.0-59.9, adult ([place])   Chronic respiratory failure (HCC)   CKD (chronic kidney disease) stage 3, GFR 30-59 ml/min (HCC)   Essential hypertension   OSA on CPAP  Significant Hospital Events   9/18 Admit to PCCM  Consults:  Ophthamology PMT  Procedures:  9/18 ETT >> 9/21 L IJ CVL >> Foley   Significant Diagnostic Tests:  CT head/C-spine/Maxillofacial 9/18>>Small left frontal scalp  hematoma without skull fracture.  Fracture of the left lamina papyracea with herniation of the left medial rectus muscle into the ethmoid sinus. Small amount of retro bulbar stranding, likely hemorrhage.  Right lung apex consolidation concerning for pneumonia.  9/18 TTE >>LVEF 60-65%; no regional wall abnormalities; G1DD, normal RV EEG 9/20 > diffuse encephalopathy   9/24 MRI brain >> Probable subtle diffuse diffusion-weighted signal abnormality throughout the cerebellum. Subtle symmetric diffusion-weighted signal abnormality is also questioned within the medial temporal lobes/hippocampi. These findings are highly suspicious for hypoxic/ischemic injury.  Small foci of apparent diffusion weighted hyperintensity within the paramedian frontoparietal lobes appear to predominant reflect susceptibility artifact from adjacent dural calcification. However, two punctate acute infarcts within the paramedian right frontal lobe are difficult to exclude.  Medially displaced acute fracture of the left lamina papyracea. There is also an acute, displaced fracture of the left fovea ethmoidalis. Orbital fat and the medial rectus muscle extend into the left ethmoid sinus. Additionally, orbital fat extends from the left ethmoid sinus through the fracture defect in the fovea ethmoidalis intracranially into the left anterior cranial fossa. The intracranial component of orbital fat measures 1 cm and exerts mild local mass effect upon the anteroinferior left frontal lobe.  Mild cerebral white matter chronic small vessel ischemic disease.  Micro Data:  9/18 Sars-CoV-2>>negative 9/18 MRSA PCR  >> positive  Antimicrobials:  9/19 unasyn >>9/25   Interim history/subjective:  Copious oral secretions Afebrile  -939/ net -1.4L Failed  SBT this am  Remains on low dose propofol, weaning  Continue to follow intermittent commands  Family meeting scheduled today at 1400  Objective   Blood pressure 114/62, pulse 67,  temperature (!) 97.3 F (36.3 C), resp. rate (!) 22, height 5\' 8"  (1.727 m), weight 133.7 kg, SpO2 98 %. CVP:  [3 mmHg-21 mmHg] 14 mmHg  Vent Mode: PRVC FiO2 (%):  [40 %-50 %] 40 % Set Rate:  [22 bmp] 22 bmp Vt Set:  [380 mL] 380 mL PEEP:  [5 cmH20] 5 cmH20 Plateau Pressure:  [22 cmH20-25 cmH20] 25 cmH20   Intake/Output Summary (Last 24 hours) at 03/12/2020 0900 Last data filed at 03/12/2020 0800 Gross per 24 hour  Intake 1138.51 ml  Output 2400 ml  Net -1261.49 ml   Filed Weights   03/10/20 0100 03/11/20 0432 03/12/20 0500  Weight: 133.4 kg 134.5 kg 133.7 kg   General:  Obese elderly female lying in bed in NAD HEENT: MM pink/moist- copious oral secretions, ETT 7- 18 at lip, OGT, pupils 3/reactive, large frontal hematoma, resolving bi-orbital ecchymosis  Neuro: Eyes open, does not track, will stick tongue out consistently to command but otherwise no spont movement noted, flicker withdrawal to pain in all extremities CV: rr, no murmur PULM:  Non labored, failed PSV trial on my exam- low TVs and increasing RR, clear anteriorly, diminished in bases, scant clear secretions, +cough GI: soft, hyperactive BS, foley - cyu Extremities: warm/dry, no edema  Skin: no rashes   Hgb stable Improved leukocytosis 12.1-> 10.5  Assessment & Plan:   Cardiac arrest with hypoxic and hypercarbic respiratory failure Patient with prolonged downtime.  Greater than 45 minutes. Presumed PEA cardiac arrest.  Had 4 separate events with ROSC obtained Encephalopathy - concerning for anoxic injury  - EEG 9/19 and LTM 9/20 - no seizures/generalized nonspecific cerebral dysfunction - TTE with LVEF 60-65%, G1DD - remains hemodynamically stable - Appreciate Neurology assistance in neuro prognostication; given her MRI/ CT findings, some concern that her orbital fx might be extending into the skull base, making her more susceptible for basal meningitis and therefore possibility of CNS infection must be considered    - continue serial neuro exams  - Avoid fevers  Hypotension - presumed 2/2 sedation plus lingering sepsis - remains off pressors since 9/23 and hemodynamically stable  - goal MAP > 65  Periorbital fracture with hyphema - Herniation of the left medial rectus muscle into the sinus with small amount of retro bulbar stranding. - Ophthalmology consulted 9/20 incase any interventions required, nothing for now.  They will follow peripherally unless pt wakes up and has visual changes / pain / etc.  Hypoxic/ hypercarbic respiratory failure, in the setting of cardiac arrest Upper lobe pneumonia, likely aspiration during cardiac event. No recent symptoms per son, no leukocytosis or fever - continue full MV support with VAP bundle - daily PSV trials, failed 9/27 am (low TVs, high RR) - s/p completion Unasyn x 7 days on 9/25 - trend WBC/ fever curve  Type 2 diabetes, controlled  - SSI moderate with CBGs - levemir 12 units BID   AKI on CKD-  ATN related to cardiac arrest Hypernatremia- free water deficit 2850 HFpEF, hypertension - Echo Grade 1 diastolic dysfunction. Creatinine near baseline of 1.7-1.8 (patient has duplicate MRN) Did well with lasix/albumin/metolazone challenge, BUN may limit [place] further - currently net neg balance, hold further diuresis today  - continue foley ~0.7 ml/kg/hr - start free water flushes  - trend renal indices  -  Strict I/O - borderline hypokalemia 3.5, replete KCL 20 meq x 1, Mag ok   Constipation - BM 9/27 - continue bowel regimen  Best practice:  Diet: TFs- advance to goal per RD  Pain/Anxiety/Delirium protocol (if indicated): minimize propofol, prn fentanyl VAP protocol (if indicated): HOB 30 degrees, suction as needed DVT prophylaxis: SCDs, heparin GI prophylaxis: Protonix Glucose control: SSI Mobility: Bedrest Code Status: Full code Family Communication: Family meeting today at 1400 per PMT Disposition: ICU   CCT: 35 mins  Kennieth Rad,  ACNP [facility] 03/12/2020, 9:01 AM  See Shea Evans for personal pager PCCM on call pager [phone]

## 2020-03-12 NOTE — Progress Notes (Signed)
Met with family along with chaplain and palliative care (appreciate help).  Discussed progress to date (some improvement, still lots of damage, unclear if will recover but if does will need months in [facility]).   They would like to proceed with trach/PEG.  We agreed that in event of further cardiac arrest patient would not want CPR to further her suffering.  We also agreed dialysis would not be in her best interest.  Erskine Emery MD PCCM

## 2020-03-12 NOTE — Progress Notes (Signed)
   03/12/20 1400  Clinical Encounter Type  Visited With Patient and family together;Health care provider  Visit Type Initial;Spiritual support  Referral From Palliative care team  Consult/Referral To Chaplain  Spiritual Encounters  Spiritual Needs Prayer  This chaplain responded to PMT consult and joined the MD and PA-MD in a Pt. Family Meeting. The Pt. Sisters-Peggy, Belenda Cruise and son-Brian are bedside. The Pt. son-Bronson is connected by phone.  The family and Pt. are listening to the hymn "One Day at a Time" with the Pt. as the chaplain arrived.  The family shares with the chaplain the Pt. home church is [employer]. The family shares the Pt. is more responsive to them today in following the family's presence and requests to blinks her eyes.  The family accepted the chaplain's invitation for follow up spiritual care and prayer.  The chaplain hopes to discover how the Pt. spirituality influences her healthcare.

## 2020-03-12 NOTE — Progress Notes (Signed)
Pharmacy Antibiotic Note  Kimberly Howell is a 74 y.o. female admitted on 03/03/2020 with aspiration PNA.    Patient completed course of unasyn for her possible aspiration. Now with new fevers, wbc normal at 10. Patient with orbital fracture that on CT/MRI read appears to be extending into the skull base and might make patient more susceptible for basal meningitis. Discussed antibiotic options with CCM attending, will start empiric ceftriaxone and vancomycin.   Plan: Ceftriaxone 2g IV q12 hours Vancomycin 2.5g IV now then 1750mg  IV q48 hours - check levels as indicated  Height: 5\' 8"  (172.7 cm) Weight: 133.7 kg (294 lb 12.1 oz) IBW/kg (Calculated) : 63.9  Temp (24hrs), Avg:99.5 F (37.5 C), Min:97.3 F (36.3 C), Max:101.1 F (38.4 C)  Recent Labs  Lab 03/08/20 0330 03/09/20 0421 03/10/20 0435 03/11/20 0421 03/12/20 0456  WBC 14.5* 11.7* 12.4* 12.1* 10.5  CREATININE 3.08* 3.02* 3.09* 3.39* 3.12*    Estimated Creatinine Clearance: 24 mL/min (A) (by C-G formula based on SCr of 3.12 mg/dL (H)).    Allergies  Allergen Reactions  . Prednisone Other (See Comments)    Increases blood sugar    Antimicrobials this admission: Unasyn 9/19 > 9/26 Vancomycin 9/27>> Ceftriaxone 9/27>>  Microbiology results: 9/18 MRSA PCR - Positive 9/18 COVID- neg 9/27 pan reculture  Thank you for allowing pharmacy to be a part of this patient's care.  Erin Hearing PharmD., BCPS Clinical Pharmacist 03/12/2020 4:39 PM     [facility] pharmacy phone numbers are listed on [email or web address]

## 2020-03-13 ENCOUNTER — Encounter (HOSPITAL_COMMUNITY): Payer: Self-pay | Admitting: Pulmonary Disease

## 2020-03-13 ENCOUNTER — Inpatient Hospital Stay (HOSPITAL_COMMUNITY): Payer: Medicare HMO

## 2020-03-13 DIAGNOSIS — J9601 Acute respiratory failure with hypoxia: Secondary | ICD-10-CM | POA: Diagnosis not present

## 2020-03-13 LAB — PROTIME-INR
INR: 1.2 (ref 0.8–1.2)
Prothrombin Time: 14.7 seconds (ref 11.4–15.2)

## 2020-03-13 LAB — BASIC METABOLIC PANEL
Anion gap: 15 (ref 5–15)
BUN: 80 mg/dL — ABNORMAL HIGH (ref 8–23)
CO2: 29 mmol/L (ref 22–32)
Calcium: 8.5 mg/dL — ABNORMAL LOW (ref 8.9–10.3)
Chloride: 102 mmol/L (ref 98–111)
Creatinine, Ser: 2.86 mg/dL — ABNORMAL HIGH (ref 0.44–1.00)
GFR calc Af Amer: 18 mL/min — ABNORMAL LOW (ref >60)
GFR calc non Af Amer: 16 mL/min — ABNORMAL LOW (ref >60)
Glucose, Bld: 100 mg/dL — ABNORMAL HIGH (ref 70–99)
Potassium: 3.9 mmol/L (ref 3.5–5.1)
Sodium: 146 mmol/L — ABNORMAL HIGH (ref 135–145)

## 2020-03-13 LAB — CBC
HCT: 25.6 % — ABNORMAL LOW (ref 36.0–46.0)
Hemoglobin: 7.7 g/dL — ABNORMAL LOW (ref 12.0–15.0)
MCH: 29.7 pg (ref 26.0–34.0)
MCHC: 30.1 g/dL (ref 30.0–36.0)
MCV: 98.8 fL (ref 80.0–100.0)
Platelets: 267 10*3/uL (ref 150–400)
RBC: 2.59 MIL/uL — ABNORMAL LOW (ref 3.87–5.11)
RDW: 15.7 % — ABNORMAL HIGH (ref 11.5–15.5)
WBC: 13.5 10*3/uL — ABNORMAL HIGH (ref 4.0–10.5)
nRBC: 0.4 % — ABNORMAL HIGH (ref 0.0–0.2)

## 2020-03-13 LAB — URINE CULTURE: Culture: NO GROWTH

## 2020-03-13 LAB — GLUCOSE, CAPILLARY
Glucose-Capillary: 100 mg/dL — ABNORMAL HIGH (ref 70–99)
Glucose-Capillary: 100 mg/dL — ABNORMAL HIGH (ref 70–99)
Glucose-Capillary: 139 mg/dL — ABNORMAL HIGH (ref 70–99)
Glucose-Capillary: 90 mg/dL (ref 70–99)
Glucose-Capillary: 94 mg/dL (ref 70–99)
Glucose-Capillary: 94 mg/dL (ref 70–99)
Glucose-Capillary: 99 mg/dL (ref 70–99)

## 2020-03-13 LAB — PROCALCITONIN: Procalcitonin: 0.6 ng/mL

## 2020-03-13 LAB — TRIGLYCERIDES: Triglycerides: 151 mg/dL — ABNORMAL HIGH (ref <150)

## 2020-03-13 LAB — MAGNESIUM: Magnesium: 2.5 mg/dL — ABNORMAL HIGH (ref 1.7–2.4)

## 2020-03-13 MED ORDER — NOREPINEPHRINE 4 MG/250ML-% IV SOLN
INTRAVENOUS | Status: AC
  Filled 2020-03-13: qty 250

## 2020-03-13 MED ORDER — SODIUM CHLORIDE 0.9 % IV SOLN
2.0000 g | INTRAVENOUS | Status: DC
  Administered 2020-03-14: 2 g via INTRAVENOUS
  Filled 2020-03-13: qty 2

## 2020-03-13 MED ORDER — CHLORHEXIDINE GLUCONATE 0.12 % MT SOLN
15.0000 mL | Freq: Two times a day (BID) | OROMUCOSAL | Status: DC
  Administered 2020-03-13 – 2020-03-28 (×30): 15 mL via OROMUCOSAL
  Filled 2020-03-13 (×20): qty 15

## 2020-03-13 MED ORDER — ORAL CARE MOUTH RINSE
15.0000 mL | Freq: Two times a day (BID) | OROMUCOSAL | Status: DC
  Administered 2020-03-14 – 2020-03-31 (×26): 15 mL via OROMUCOSAL

## 2020-03-13 MED ORDER — PANTOPRAZOLE SODIUM 40 MG IV SOLR
40.0000 mg | Freq: Every day | INTRAVENOUS | Status: DC
  Administered 2020-03-13 – 2020-03-20 (×8): 40 mg via INTRAVENOUS
  Filled 2020-03-13 (×8): qty 40

## 2020-03-13 NOTE — Progress Notes (Signed)
Per Ina Homes, MD, remove foley and Central Line POST PEG TUBE procedure.

## 2020-03-13 NOTE — Progress Notes (Signed)
This chaplain is present for F/U PMT spiritual care.  The Pt. sister-Peggy is bedside.  The chaplain greeted the Pt. The Pt. responded with a smile to the chaplain's statement about getting to know her family.  "One day at a time"  continues to be the family's theme for Pt. medical care.  The family is celebrating the Pt. transition to a trach and the positive eye exam. F/U spiritual care is available as needed.

## 2020-03-13 NOTE — Procedures (Signed)
Percutaneous Tracheostomy Procedure Note   Kimberly Howell  [id number]  Dec 23, 1945  Date:03/13/20  Time:9:16 AM   Provider Performing:Nunzio Banet C Tamala Julian  Procedure: Percutaneous Tracheostomy with Bronchoscopic Guidance (31600)  Indication(s) Persistent respiratory failure  Consent Risks of the procedure as well as the alternatives and risks of each were explained to the patient and/or caregiver.  Consent for the procedure was obtained.  Anesthesia Etomidate, Versed, Fentanyl, Vecuronium   Time Out Verified patient identification, verified procedure, site/side was marked, verified correct patient position, special equipment/implants available, medications/allergies/relevant history reviewed, required imaging and test results available.   Sterile Technique Maximal sterile technique including sterile barrier drape, hand hygiene, sterile gown, sterile gloves, mask, hair covering.    Procedure Description Appropriate anatomy identified by palpation.  Patient's neck prepped and draped in sterile fashion.  1% lidocaine with epinephrine was used to anesthetize skin overlying neck.  1.5cm incision made and blunt dissection performed until tracheal rings could be easily palpated.   Then a size proximal XLT 6-0 Shiley tracheostomy was placed under bronchoscopic visualization using usual Seldinger technique and serial dilation.   Bronchoscope confirmed placement above the carina.  Tracheostomy was sutured in place with adhesive pad to protect skin under pressure.    Patient connected to ventilator.   Complications/Tolerance None; patient tolerated the procedure well. Chest X-ray is ordered to confirm no post-procedural complication.   EBL Minimal   Specimen(s) None

## 2020-03-13 NOTE — Progress Notes (Signed)
Patient transported to CT and back without complications. RN at bedside.  

## 2020-03-13 NOTE — Consult Note (Signed)
Ophthalmology Consult  This is a 74 yo female h/o cardiac arrest with CPR.  She had a fall upon arrest and had orbital fractures of the left side.  Pt is able to follow simple commands.  Pt wears glasses for reading.  On exam Vision could not be assessed due to having tracheotomy placed today.  Pt did shake her head no when asked if wore glasses for distance which was confirmed by family.  IOP was 18 OD and 19 OS. PERRL, Extraocular motility was intact with full ductions and versions.  No limitation in gaze and no signs of entrapment seen.  On dilated exam retina was flat and intact.  A/P S/P arrest with trauma to left side of head with orbital fracture.  This occurred 10 days ago so swelling has subsided.  Resolving subconjunctival hemorrhage temporally left eye.  The rest of the exam was within normal limits.  Pt to follow up in office upon discharge if possible.  Please feel free to re consult if any ocular problems arise.  Thank you for giving me the opportunity to participate in the care of this patient.  Please feel free to contact me if you have any concerns.  Darleen Crocker, M.D. (cell) [phone] (office) [phone]

## 2020-03-13 NOTE — Procedures (Signed)
Diagnostic Bronchoscopy  Kimberly Howell  [id number]  01-07-1946  Date:03/13/20  Time:9:17 AM   Provider Performing:Odilia Damico Alfredo Martinez, NP-C, AGACNP-BC  Procedure: Diagnostic Bronchoscopy (85885)  Indication(s) Assist with direct visualization of tracheostomy placement  Consent Risks of the procedure as well as the alternatives and risks of each were explained to the patient and/or caregiver.  Consent for the procedure was obtained.   Anesthesia See separate tracheostomy note   Time Out Verified patient identification, verified procedure, site/side was marked, verified correct patient position, special equipment/implants available, medications/allergies/relevant history reviewed, required imaging and test results available.   Sterile Technique Usual hand hygiene, masks, gowns, and gloves were used   Procedure Description Bronchoscope advanced through endotracheal tube and into airway.  After suctioning out tracheal secretions, bronchoscope used to provide direct visualization of tracheostomy placement.   Complications/Tolerance None; patient tolerated the procedure well.   EBL None  Specimen(s) BAL from RML   Procedure performed under direct supervision of Dr. Tamala Julian and with fiberoptic bronchoscopy for direct visualization.    Kimberly Gens, MSN, NP-C, AGACNP-BC [facility] 03/13/2020, 9:20 AM   Please see [email or web address] for pager details.

## 2020-03-13 NOTE — Progress Notes (Signed)
Discussed with on call ophthalmology who will assess eye function but asks [place] to call ENT in case needs repair.  Erskine Emery MD PCCM

## 2020-03-13 NOTE — Progress Notes (Signed)
SLP Cancellation Note  Patient Details Name: DELAYNIE STETZER MRN: [id number] DOB: 02-Nov-1945   Cancelled treatment:       Reason Eval/Treat Not Completed: Other (comment)  Patient with new tracheostomy. Orders for SLP eval and treat for PMSV and swallowing received. Will follow pt closely for readiness for SLP interventions as appropriate.     Dravon Nott, Katherene Ponto 03/13/2020, 11:07 AM

## 2020-03-13 NOTE — Progress Notes (Signed)
Reviewed case with on call neurosurgeon: no indication to repair unless clear CSF leak which patient has had no symptoms of.  Erskine Emery MD PCCM

## 2020-03-13 NOTE — Progress Notes (Signed)
NAME:  Kimberly Howell, MRN:  [id number], DOB:  21-Sep-1945, LOS: 63 ADMISSION DATE:  03/03/2020, CONSULTATION DATE:  03/13/20 REFERRING MD:  EDP, CHIEF COMPLAINT:  Cardiac arrest   Brief History   74 year old female with past medical history of chronic respiratory failure with COPD on 3 L home O2 and CPAP nightly, stage IV CKD, hypertension, morbid obesity who had a cardiac arrest with >45 minutes CPR before ROSC then lost pulses three more times.  Intubated and PCCM consulted for admission  History of present illness   Kimberly Howell is a 74 year old female with past medical history significant for chronic respiratory failure with COPD on 3 L home O2 and CPAP nightly, stage IV CKD, hypertension, morbid obesity.  Her son lives with her and states she had resumed her CPAP last night, he was up in the night with her around 1 AM and she seemed fine.  Several hours later he heard the sound of her falling in the bathroom.  He found her face down and unresponsive and did not think she was breathing so called 911 and started chest compressions.  When EMS arrived CPR performed for 45-50 minutes before ROSC achieved.  Per report, patient lost pulses 3 more times and ended to the ED with CPR in progress.    CT head negative for intracranial bleed, L lamina papyracea fracture with herniation of the L medial rectus muscle.  Labs significant for creatinine 2.08, elevated LFTs AST 392, ALT 247.  She has been unresponsive in the ED with sedation. PCCM consulted for admission  Past Medical History  Morbid obesity with BMI of 50.0-59.9, adult ([place])   Chronic respiratory failure (HCC)   CKD (chronic kidney disease) stage 3, GFR 30-59 ml/min (HCC)   Essential hypertension   OSA on CPAP  Significant Hospital Events   9/18 Admit to PCCM  Consults:  Ophthamology  Procedures:  9/18 ETT  Significant Diagnostic Tests:  CT head/C-spine/Maxillofacial>>Small left frontal scalp hematoma without skull  fracture. Fracture of the left lamina papyracea with herniation of the left medial rectus muscle into the ethmoid sinus. Small amount of retro bulbar stranding, likely hemorrhage.  Right lung apex consolidation concerning for pneumonia. EEG 9/20 > diffuse encephalopathy  Micro Data:  9/18 Sars-CoV-2>>negative  Antimicrobials:     Interim history/subjective:  No issues, tracking intermittently but not always following commands. Having some low grade fevers.  Objective   Blood pressure (!) 132/54, pulse 81, temperature (!) 100.4 F (38 C), resp. rate 20, height _0  (1.727 m), weight 132.9 kg, SpO2 99 %.    Vent Mode: PRVC FiO2 (%):  [40 %-60 %] 60 % Set Rate:  [22 bmp] 22 bmp Vt Set:  [380 mL] 380 mL PEEP:  [5 cmH20] 5 cmH20 Plateau Pressure:  [21 cmH20-26 cmH20] 21 cmH20   Intake/Output Summary (Last 24 hours) at 03/13/2020 0917 Last data filed at 03/13/2020 0600 Gross per 24 hour  Intake 566.16 ml  Output 1780 ml  Net -1213.84 ml   Filed Weights   03/11/20 0432 03/12/20 0500 03/13/20 0500  Weight: 134.5 kg 133.7 kg 132.9 kg     Constitutional: obese woman in no acute distress Eyes: eyes are anicteric, reactive to light Ears, nose, mouth, and throat: mucous membranes moist, trachea midline, ETT with thick secretions Cardiovascular: heart sounds are regular, ext are warm to touch. 1+ edema improved Respiratory: diminished at bases, triggers vent Gastrointestinal: abdomen is soft with + BS Skin: No rashes, normal turgor Neurologic: moves BL  upper ext and LLE, I cannot get her to do this consistently to command, she has cough/gag/corneals.  She localizes to pain. Psychiatric: RASS 0, roving gaze question blindness  Net -1.3L Hgb stable Leukocytosis stable Sugars okay   Assessment & Plan:    Cardiac arrest with hypoxic and hypercarbic respiratory failure Patient with prolonged downtime.  Greater than 45 minutes. Presumed PEA cardiac arrest.  Had 4 separate events  with ROSC obtained.- EKG without signs of ischemia.  Patient has been unsteady on her feet recently, family states she was taking too much propranolol occasionally accidentally, this may have been proximal issue.  Having some slow neurological recovery, discussed with family at length 9/27 and they would like trial of LTACH with trach/PEG to see how much recovery she has. - Trach/PEG, LTACH placement  Periorbital fracture with hyphema - Herniation of the left medial rectus muscle into the sinus with small amount of retro bulbar stranding. - Have asked ophthalmologist on call to review case to see if any operative intervention prior to [facility], I do not see any notes in chart from them,  Upper lobe pneumonia, likely aspiration during cardiac event. No recent symptoms per son, no leukocytosis or fever.  S/p unasyn x 7 days. - Recurrent fevers, on ceftriaxone, f/u BAL and blood culture data but suspect noninfectious  Type 2 diabetes - SSI with CBGs  AKI on CKD- resolved, now close to euvolemic and new baseline Cr HFpEF, hypertension - Echo Grade 1 diastolic dysfunction. - Monitor I/O, BMP  Constipation - Improved, rectal tube in place   Best practice:  Diet: TFs Pain/Anxiety/Delirium protocol (if indicated): in place VAP protocol (if indicated): HOB 30 degrees, suction as needed DVT prophylaxis: SCDs, heparin to resume tomorrow GI prophylaxis: Protonix Glucose control: SSI Mobility: Bedrest Code Status: Full code Family Communication: will update Disposition: ICU   The patient is critically ill with multiple organ systems failure and requires high complexity decision making for assessment and support, frequent evaluation and titration of therapies, application of advanced monitoring technologies and extensive interpretation of multiple databases. Critical Care Time devoted to patient care services described in this note independent of APP/resident time (if applicable)  is 37 minutes.    Erskine Emery MD [facility] 03/13/2020 9:17 AM Personal pager: [phone] If unanswered, please page CCM On-call: [phone]

## 2020-03-13 NOTE — Consult Note (Signed)
Reason for Consult:orbital fracture Referring Physician: CCM  Kimberly Howell is an 74 y.o. female.  HPI: hx of cardiac arrest and fall on 9/18 and had arrest with CPR for 45 minutes. SHe had CT scan with left orbital fracture. She has not had assessment for EOM function and she current is not alert secondary to new tracheotomy sedation. She underwent an MRI which showed orbital fat into the anterior cranial fossa.   History reviewed. No pertinent past medical history.  History reviewed. No pertinent surgical history.  History reviewed. No pertinent family history.  Social History:  has no history on file for tobacco use, alcohol use, and drug use.  Allergies:  Allergies  Allergen Reactions  . Prednisone Other (See Comments)    Increases blood sugar    Medications: I have reviewed the patient's current medications.  Results for orders placed or performed during the hospital encounter of 03/03/20 (from the past 48 hour(s))  Glucose, capillary     Status: Abnormal   Collection Time: 03/11/20 11:50 AM  Result Value Ref Range   Glucose-Capillary 112 (H) 70 - 99 mg/dL    Comment: Glucose reference range applies only to samples taken after fasting for at least 8 hours.  Glucose, capillary     Status: Abnormal   Collection Time: 03/11/20  3:41 PM  Result Value Ref Range   Glucose-Capillary 102 (H) 70 - 99 mg/dL    Comment: Glucose reference range applies only to samples taken after fasting for at least 8 hours.  Glucose, capillary     Status: Abnormal   Collection Time: 03/11/20  8:19 PM  Result Value Ref Range   Glucose-Capillary 101 (H) 70 - 99 mg/dL    Comment: Glucose reference range applies only to samples taken after fasting for at least 8 hours.  Glucose, capillary     Status: Abnormal   Collection Time: 03/12/20 12:41 AM  Result Value Ref Range   Glucose-Capillary 105 (H) 70 - 99 mg/dL    Comment: Glucose reference range applies only to samples taken after fasting for at  least 8 hours.  Glucose, capillary     Status: Abnormal   Collection Time: 03/12/20  4:04 AM  Result Value Ref Range   Glucose-Capillary 108 (H) 70 - 99 mg/dL    Comment: Glucose reference range applies only to samples taken after fasting for at least 8 hours.  Basic metabolic panel     Status: Abnormal   Collection Time: 03/12/20  4:56 AM  Result Value Ref Range   Sodium 147 (H) 135 - 145 mmol/L   Potassium 3.5 3.5 - 5.1 mmol/L   Chloride 100 98 - 111 mmol/L   CO2 33 (H) 22 - 32 mmol/L   Glucose, Bld 132 (H) 70 - 99 mg/dL    Comment: Glucose reference range applies only to samples taken after fasting for at least 8 hours.   BUN 80 (H) 8 - 23 mg/dL   Creatinine, Ser 3.12 (H) 0.44 - 1.00 mg/dL   Calcium 8.7 (L) 8.9 - 10.3 mg/dL   GFR calc non Af Amer 14 (L) >60 mL/min   GFR calc Af Amer 17 (L) >60 mL/min   Anion gap 14 5 - 15    Comment: Performed at [facility] [address], [place], [place] [address]  Magnesium     Status: Abnormal   Collection Time: 03/12/20  4:56 AM  Result Value Ref Range   Magnesium 2.5 (H) 1.7 - 2.4  mg/dL    Comment: Performed at [facility], [place] [address], [place], [place] [address]  CBC     Status: Abnormal   Collection Time: 03/12/20  4:56 AM  Result Value Ref Range   WBC 10.5 4.0 - 10.5 K/uL   RBC 2.56 (L) 3.87 - 5.11 MIL/uL   Hemoglobin 7.4 (L) 12.0 - 15.0 g/dL   HCT 25.2 (L) 36 - 46 %   MCV 98.4 80.0 - 100.0 fL   MCH 28.9 26.0 - 34.0 pg   MCHC 29.4 (L) 30.0 - 36.0 g/dL   RDW 15.6 (H) 11.5 - 15.5 %   Platelets 236 150 - 400 K/uL   nRBC 0.4 (H) 0.0 - 0.2 %    Comment: Performed at [facility], [place] [address], [place], [place] [address]  Glucose, capillary     Status: Abnormal   Collection Time: 03/12/20  7:20 AM  Result Value Ref Range   Glucose-Capillary 127 (H) 70 - 99 mg/dL    Comment: Glucose reference range applies only to samples taken after fasting for at least 8 hours.  Glucose, capillary     Status: Abnormal    Collection Time: 03/12/20 11:42 AM  Result Value Ref Range   Glucose-Capillary 112 (H) 70 - 99 mg/dL    Comment: Glucose reference range applies only to samples taken after fasting for at least 8 hours.  Procalcitonin - Baseline     Status: None   Collection Time: 03/12/20  4:23 PM  Result Value Ref Range   Procalcitonin 0.62 ng/mL    Comment:        Interpretation: PCT > 0.5 ng/mL and <= 2 ng/mL: Systemic infection (sepsis) is possible, but other conditions are known to elevate PCT as well. (NOTE)       Sepsis PCT Algorithm           Lower Respiratory Tract                                      Infection PCT Algorithm    ----------------------------     ----------------------------         PCT < 0.25 ng/mL                PCT < 0.10 ng/mL          Strongly encourage             Strongly discourage   discontinuation of antibiotics    initiation of antibiotics    ----------------------------     -----------------------------       PCT 0.25 - 0.50 ng/mL            PCT 0.10 - 0.25 ng/mL               OR       >80% decrease in PCT            Discourage initiation of                                            antibiotics      Encourage discontinuation           of antibiotics    ----------------------------     -----------------------------         PCT >=  0.50 ng/mL              PCT 0.26 - 0.50 ng/mL                AND       <80% decrease in PCT             Encourage initiation of                                             antibiotics       Encourage continuation           of antibiotics    ----------------------------     -----------------------------        PCT >= 0.50 ng/mL                  PCT > 0.50 ng/mL               AND         increase in PCT                  Strongly encourage                                      initiation of antibiotics    Strongly encourage escalation           of antibiotics                                     -----------------------------                                            PCT <= 0.25 ng/mL                                                 OR                                        > 80% decrease in PCT                                      Discontinue / Do not initiate                                             antibiotics  Performed at [facility], [address], [place], [place] [address]   Culture, blood (routine x 2)     Status: None (Preliminary result)   Collection Time: 03/12/20  4:24 PM   Specimen: BLOOD LEFT ARM  Result Value Ref Range   Specimen Description BLOOD LEFT ARM    Special Requests      BOTTLES  DRAWN AEROBIC AND ANAEROBIC Blood Culture adequate volume   Culture      NO GROWTH < 12 HOURS Performed at [facility] [address], [place], [place] [address]    Report Status PENDING   Culture, blood (routine x 2)     Status: None (Preliminary result)   Collection Time: 03/12/20  4:36 PM   Specimen: BLOOD LEFT ARM  Result Value Ref Range   Specimen Description BLOOD LEFT ARM    Special Requests      BOTTLES DRAWN AEROBIC AND ANAEROBIC Blood Culture adequate volume   Culture      NO GROWTH < 12 HOURS Performed at [facility], [place] [address] [place], [place] [address]    Report Status PENDING   CSF cell count with differential     Status: None   Collection Time: 03/12/20  4:59 PM  Result Value Ref Range   Tube # 4    Color, CSF COLORLESS COLORLESS   Appearance, CSF CLEAR CLEAR   Supernatant NOT INDICATED    RBC Count, CSF 0 0 /cu mm   WBC, CSF 4 0 - 5 /cu mm   Other Cells, CSF TOO FEW TO COUNT, SMEAR AVAILABLE FOR REVIEW     Comment: RARE LYMPHOCYTES AND MACROPHAGES Performed at [facility] [address], [place], [place] [address]   Protein and glucose, CSF     Status: Abnormal   Collection Time: 03/12/20  4:59 PM  Result Value Ref Range   Glucose, CSF 83 (H) 40 - 70 mg/dL   Total  Protein, CSF 78 (H) 15 - 45 mg/dL    Comment: Performed at [facility] [address] [place], [place] [address]  CSF culture     Status: None (Preliminary result)   Collection Time: 03/12/20  4:59 PM   Specimen: CSF; Cerebrospinal Fluid  Result Value Ref Range   Specimen Description CSF    Special Requests Normal    Gram Stain      WBC PRESENT,BOTH PMN AND MONONUCLEAR NO ORGANISMS SEEN CYTOSPIN SMEAR    Culture      NO GROWTH < 24 HOURS Performed at [facility], [place] [address], [place], [place] [address]    Report Status PENDING   Glucose, capillary     Status: Abnormal   Collection Time: 03/12/20  5:09 PM  Result Value Ref Range   Glucose-Capillary 156 (H) 70 - 99 mg/dL    Comment: Glucose reference range applies only to samples taken after fasting for at least 8 hours.  Glucose, capillary     Status: Abnormal   Collection Time: 03/12/20  9:21 PM  Result Value Ref Range   Glucose-Capillary 143 (H) 70 - 99 mg/dL    Comment: Glucose reference range applies only to samples taken after fasting for at least 8 hours.  Glucose, capillary     Status: Abnormal   Collection Time: 03/13/20 12:11 AM  Result Value Ref Range   Glucose-Capillary 139 (H) 70 - 99 mg/dL    Comment: Glucose reference range applies only to samples taken after fasting for at least 8 hours.  Basic metabolic panel     Status: Abnormal   Collection Time: 03/13/20  4:17 AM  Result Value Ref Range   Sodium 146 (H) 135 - 145 mmol/L   Potassium 3.9 3.5 - 5.1 mmol/L   Chloride 102 98 - 111 mmol/L   CO2 29 22 - 32  mmol/L   Glucose, Bld 100 (H) 70 - 99 mg/dL    Comment: Glucose reference range applies only to samples taken after fasting for at least 8 hours.   BUN 80 (H) 8 - 23 mg/dL   Creatinine, Ser 2.86 (H) 0.44 - 1.00 mg/dL   Calcium 8.5 (L) 8.9 - 10.3 mg/dL   GFR calc non Af Amer 16 (L) >60 mL/min   GFR calc Af Amer 18 (L) >60 mL/min   Anion gap 15 5 - 15    Comment: Performed at [facility] [address] [place], [place] [address]  Magnesium      Status: Abnormal   Collection Time: 03/13/20  4:17 AM  Result Value Ref Range   Magnesium 2.5 (H) 1.7 - 2.4 mg/dL    Comment: Performed at [facility] [address], [place], [place] [address]  CBC     Status: Abnormal   Collection Time: 03/13/20  4:17 AM  Result Value Ref Range   WBC 13.5 (H) 4.0 - 10.5 K/uL   RBC 2.59 (L) 3.87 - 5.11 MIL/uL   Hemoglobin 7.7 (L) 12.0 - 15.0 g/dL   HCT 25.6 (L) 36 - 46 %   MCV 98.8 80.0 - 100.0 fL   MCH 29.7 26.0 - 34.0 pg   MCHC 30.1 30.0 - 36.0 g/dL   RDW 15.7 (H) 11.5 - 15.5 %   Platelets 267 150 - 400 K/uL   nRBC 0.4 (H) 0.0 - 0.2 %    Comment: Performed at [facility] [address], [place], [place] [address]  Triglycerides     Status: Abnormal   Collection Time: 03/13/20  4:17 AM  Result Value Ref Range   Triglycerides 151 (H) <150 mg/dL    Comment: Performed at [facility] [address], [place], [place] [address]  Procalcitonin     Status: None   Collection Time: 03/13/20  4:17 AM  Result Value Ref Range   Procalcitonin 0.60 ng/mL    Comment:        Interpretation: PCT > 0.5 ng/mL and <= 2 ng/mL: Systemic infection (sepsis) is possible, but other conditions are known to elevate PCT as well. (NOTE)       Sepsis PCT Algorithm           Lower Respiratory Tract                                      Infection PCT Algorithm    ----------------------------     ----------------------------         PCT < 0.25 ng/mL                PCT < 0.10 ng/mL          Strongly encourage             Strongly discourage   discontinuation of antibiotics    initiation of antibiotics    ----------------------------     -----------------------------       PCT 0.25 - 0.50 ng/mL            PCT 0.10 - 0.25 ng/mL               OR       >80% decrease in PCT            Discourage initiation of  antibiotics      Encourage discontinuation           of antibiotics    ----------------------------      -----------------------------         PCT >= 0.50 ng/mL              PCT 0.26 - 0.50 ng/mL                AND       <80% decrease in PCT             Encourage initiation of                                             antibiotics       Encourage continuation           of antibiotics    ----------------------------     -----------------------------        PCT >= 0.50 ng/mL                  PCT > 0.50 ng/mL               AND         increase in PCT                  Strongly encourage                                      initiation of antibiotics    Strongly encourage escalation           of antibiotics                                     -----------------------------                                           PCT <= 0.25 ng/mL                                                 OR                                        > 80% decrease in PCT                                      Discontinue / Do not initiate                                             antibiotics  Performed at [facility], [place] [address], [place], [place] [address]   Protime-INR     Status: None   Collection Time:  03/13/20  4:17 AM  Result Value Ref Range   Prothrombin Time 14.7 11.4 - 15.2 seconds   INR 1.2 0.8 - 1.2    Comment: (NOTE) INR goal varies based on device and disease states. Performed at [facility], [place] [address], [place], [place] [address]   Glucose, capillary     Status: None   Collection Time: 03/13/20  4:18 AM  Result Value Ref Range   Glucose-Capillary 90 70 - 99 mg/dL    Comment: Glucose reference range applies only to samples taken after fasting for at least 8 hours.  Glucose, capillary     Status: Abnormal   Collection Time: 03/13/20  8:15 AM  Result Value Ref Range   Glucose-Capillary 100 (H) 70 - 99 mg/dL    Comment: Glucose reference range applies only to samples taken after fasting for at least 8 hours.    DG Chest Port 1 View  Result Date: 03/13/2020 CLINICAL DATA:   Tracheostomy placement EXAM: PORTABLE CHEST 1 VIEW COMPARISON:  March 08, 2020 FINDINGS: Tracheostomy now present with tip 3.7 cm above the carina. Extensive subcutaneous air in the neck and supraclavicular soft tissues is likely due to recent tracheostomy placement. Central catheter tip is in the left innominate vein. No pneumothorax appreciable. There is consolidation in the left lower lobe with small left pleural effusion. There is atelectatic change in the lung bases as well. There is an equivocal right pleural effusion. Heart is enlarged with pulmonary vascularity normal. No adenopathy. There is aortic atherosclerosis. There is extensive arthropathy in the shoulders. IMPRESSION: Tracheostomy now present. Extensive soft tissue air in the neck and supraclavicular regions is likely of postoperative etiology. No pneumothorax evident. Left lower lobe airspace consolidation which in part is likely due to atelectatic change. A degree of underlying pneumonia in the left base cannot be excluded. Small pleural effusion bilaterally with bibasilar atelectasis. Stable cardiomegaly. Aortic Atherosclerosis (ICD10-I70.0). Electronically Signed   By: Lowella Grip III M.D.   On: 03/13/2020 09:42    ROS Blood pressure (!) 154/59, pulse 76, temperature 99 F (37.2 C), resp. rate (!) 22, height 5\' 8"  (1.727 m), weight 132.9 kg, SpO2 100 %. Physical Exam HENT:     Head: Normocephalic.     Comments: She is sedated but at this point she seems to shake head affirmative that she sees double. The left eye does seem to have a more sluggish movement. There is no significant swelling. Cannot assess vision    Mouth/Throat:     Mouth: Mucous membranes are moist.       Assessment/Plan: Left orbital fracture- there is herniation of fat and muscle into the left ethmoid and into the left anterior cranial fossa. There is no report of CSF leak. She has not had proper assessment of the EOM so not sure if she has any  diplopia or vision problems. Opthal is scheduled to see her and I wait their evaluation of the movement and vision. . At this point she needs evaluation about any entrapment and probably best not to repair the orbital injury from the sinus issue right now if no entrapment. She is at risk for enopthalmus later but may not be worth the risk of anesthesia and surgery right now.  The brain extension needs a neurosurgery opinion. I will reexamine her when she is more alert.   Melissa Montane 03/13/2020, 11:24 AM

## 2020-03-14 ENCOUNTER — Inpatient Hospital Stay (HOSPITAL_COMMUNITY): Payer: Medicare HMO

## 2020-03-14 ENCOUNTER — Telehealth: Payer: Self-pay | Admitting: Adult Health

## 2020-03-14 DIAGNOSIS — Z930 Tracheostomy status: Secondary | ICD-10-CM

## 2020-03-14 DIAGNOSIS — I469 Cardiac arrest, cause unspecified: Secondary | ICD-10-CM | POA: Diagnosis not present

## 2020-03-14 DIAGNOSIS — R1310 Dysphagia, unspecified: Secondary | ICD-10-CM

## 2020-03-14 HISTORY — PX: IR GASTROSTOMY TUBE MOD SED: IMG625

## 2020-03-14 LAB — CBC WITH DIFFERENTIAL/PLATELET
Abs Immature Granulocytes: 0.14 10*3/uL — ABNORMAL HIGH (ref 0.00–0.07)
Basophils Absolute: 0.1 10*3/uL (ref 0.0–0.1)
Basophils Relative: 1 %
Eosinophils Absolute: 0.5 10*3/uL (ref 0.0–0.5)
Eosinophils Relative: 4 %
HCT: 26.5 % — ABNORMAL LOW (ref 36.0–46.0)
Hemoglobin: 7.8 g/dL — ABNORMAL LOW (ref 12.0–15.0)
Immature Granulocytes: 1 %
Lymphocytes Relative: 11 %
Lymphs Abs: 1.2 10*3/uL (ref 0.7–4.0)
MCH: 29.8 pg (ref 26.0–34.0)
MCHC: 29.4 g/dL — ABNORMAL LOW (ref 30.0–36.0)
MCV: 101.1 fL — ABNORMAL HIGH (ref 80.0–100.0)
Monocytes Absolute: 0.9 10*3/uL (ref 0.1–1.0)
Monocytes Relative: 9 %
Neutro Abs: 7.7 10*3/uL (ref 1.7–7.7)
Neutrophils Relative %: 74 %
Platelets: 282 10*3/uL (ref 150–400)
RBC: 2.62 MIL/uL — ABNORMAL LOW (ref 3.87–5.11)
RDW: 15.5 % (ref 11.5–15.5)
WBC: 10.5 10*3/uL (ref 4.0–10.5)
nRBC: 0 % (ref 0.0–0.2)

## 2020-03-14 LAB — BASIC METABOLIC PANEL
Anion gap: 15 (ref 5–15)
BUN: 72 mg/dL — ABNORMAL HIGH (ref 8–23)
CO2: 28 mmol/L (ref 22–32)
Calcium: 9 mg/dL (ref 8.9–10.3)
Chloride: 107 mmol/L (ref 98–111)
Creatinine, Ser: 2.71 mg/dL — ABNORMAL HIGH (ref 0.44–1.00)
GFR calc Af Amer: 20 mL/min — ABNORMAL LOW (ref >60)
GFR calc non Af Amer: 17 mL/min — ABNORMAL LOW (ref >60)
Glucose, Bld: 93 mg/dL (ref 70–99)
Potassium: 4 mmol/L (ref 3.5–5.1)
Sodium: 150 mmol/L — ABNORMAL HIGH (ref 135–145)

## 2020-03-14 LAB — MAGNESIUM: Magnesium: 2.6 mg/dL — ABNORMAL HIGH (ref 1.7–2.4)

## 2020-03-14 LAB — GLUCOSE, CAPILLARY
Glucose-Capillary: 78 mg/dL (ref 70–99)
Glucose-Capillary: 80 mg/dL (ref 70–99)
Glucose-Capillary: 81 mg/dL (ref 70–99)
Glucose-Capillary: 83 mg/dL (ref 70–99)
Glucose-Capillary: 87 mg/dL (ref 70–99)
Glucose-Capillary: 91 mg/dL (ref 70–99)

## 2020-03-14 LAB — PROCALCITONIN: Procalcitonin: 0.5 ng/mL

## 2020-03-14 LAB — PHOSPHORUS: Phosphorus: 4.8 mg/dL — ABNORMAL HIGH (ref 2.5–4.6)

## 2020-03-14 MED ORDER — CEFAZOLIN SODIUM-DEXTROSE 2-4 GM/100ML-% IV SOLN
2.0000 g | Freq: Once | INTRAVENOUS | Status: AC
  Administered 2020-03-14: 2 g via INTRAVENOUS

## 2020-03-14 MED ORDER — MIDAZOLAM HCL 2 MG/2ML IJ SOLN
INTRAMUSCULAR | Status: AC | PRN
  Administered 2020-03-14 (×2): 1 mg via INTRAVENOUS

## 2020-03-14 MED ORDER — LIDOCAINE HCL (PF) 1 % IJ SOLN
INTRAMUSCULAR | Status: AC
  Filled 2020-03-14: qty 30

## 2020-03-14 MED ORDER — FENTANYL CITRATE (PF) 100 MCG/2ML IJ SOLN
INTRAMUSCULAR | Status: AC | PRN
  Administered 2020-03-14 (×2): 25 ug via INTRAVENOUS

## 2020-03-14 MED ORDER — PROSOURCE TF PO LIQD
45.0000 mL | Freq: Every day | ORAL | Status: DC
  Administered 2020-03-15 – 2020-04-11 (×28): 45 mL
  Filled 2020-03-14 (×28): qty 45

## 2020-03-14 MED ORDER — MIDAZOLAM HCL 2 MG/2ML IJ SOLN
INTRAMUSCULAR | Status: AC
  Filled 2020-03-14: qty 2

## 2020-03-14 MED ORDER — CEFAZOLIN SODIUM-DEXTROSE 2-4 GM/100ML-% IV SOLN
INTRAVENOUS | Status: AC
  Filled 2020-03-14: qty 100

## 2020-03-14 MED ORDER — FENTANYL CITRATE (PF) 100 MCG/2ML IJ SOLN
INTRAMUSCULAR | Status: AC
Start: 2020-03-14 — End: 2020-03-15
  Filled 2020-03-14: qty 2

## 2020-03-14 MED ORDER — LIDOCAINE HCL 1 % IJ SOLN
INTRAMUSCULAR | Status: AC | PRN
  Administered 2020-03-14: 30 mL via INTRADERMAL

## 2020-03-14 MED ORDER — VITAL AF 1.2 CAL PO LIQD
1000.0000 mL | ORAL | Status: DC
  Administered 2020-03-15 – 2020-04-10 (×20): 1000 mL
  Filled 2020-03-14 (×23): qty 1000

## 2020-03-14 MED ORDER — IOHEXOL 300 MG/ML  SOLN: 50.0000 mL | Freq: Once | INTRAMUSCULAR | Status: DC | PRN

## 2020-03-14 NOTE — Progress Notes (Signed)
Nutrition Follow-up  RD working remotely.  DOCUMENTATION CODES:   Morbid obesity  INTERVENTION:   Once PEG cleared for use by IR, resume tube feeds: - Vital AF 1.2 @ 60 ml/hr (1440 ml/day) - ProSource TF 45 ml daily - Free water per CCM  Tube feeding regimen provides 1768 kcal, 119 grams of protein, and 1168 ml of H2O.   NUTRITION DIAGNOSIS:   Inadequate oral intake related to acute illness as evidenced by NPO status.  Ongoing  GOAL:   Patient will meet greater than or equal to 90% of their needs  Unmet at this time  MONITOR:   Vent status, TF tolerance, Labs, Weight trends  REASON FOR ASSESSMENT:   Consult Enteral/tube feeding initiation and management  ASSESSMENT:   74 yo female admitted post cardiac arrest requiring 45-50 minutes of CPR before ROSC achieved followed by loss of pulse 3 more times; pt intubated and sedated; also with periorbital fracture, AKI.  PMH includes COPD on home oxygen, stage IV CKD, HTN, morbid obesity  9/28 - trach  Plan is for PEG today by IR. TF to resume once PEG cleared for use by IR. Per notes, plan for future LTACH placement. Pt without enteral access until PEG placed.  Admit weight: 140.8 kg Current weight: 130.3 kg  Per RN edema assessment, pt with +1 pitting edema to BUE and BLE.  Patient is on ventilator support via trach MV: 8.9 L/min Temp (24hrs), Avg:100.1 F (37.8 C), Min:99.3 F (37.4 C), Max:100.6 F (38.1 C)  Medications reviewed and include: SSI q 4 hours, levemir 12 units BID, protonix, sorbitol  Labs reviewed: sodium 150, phosphorus 4.8, magnesium 2.6 CBG's: 78-94 x 24 hours  UOP: 2530 ml x 24 hours I/O's: -5.1 L since admit  Diet Order:   Diet Order    None      EDUCATION NEEDS:   Not appropriate for education at this time  Skin:  Skin Assessment: Reviewed RN Assessment (MASD to pelvis)  Last BM:  03/12/20  Height:   Ht Readings from Last 1 Encounters:  03/08/20 5\' 8"  (1.727 m)     Weight:   Wt Readings from Last 1 Encounters:  03/14/20 130.3 kg    Ideal Body Weight:  54.5 kg  BMI:  Body mass index is 43.68 kg/m.  Estimated Nutritional Needs:   Kcal:  5885-0277  Protein:  110-135 grams  Fluid:  >/= 1.5 L    Gaynell Face, MS, RD, LDN Inpatient Clinical Dietitian Please see AMiON for contact information.

## 2020-03-14 NOTE — Procedures (Signed)
Interventional Radiology Procedure Note  Procedure: Placement of percutaneous 86F pull-through gastrostomy tube.  Complications: None  EBL: <10 mL  Recommendations: - NPO except for sips and chips remainder of today and overnight - Maintain G-tube to LWS until tomorrow morning  - May advance diet as tolerated and begin using tube tomorrow morning   Ruthann Cancer, MD Pager: [phone]

## 2020-03-14 NOTE — Sedation Documentation (Signed)
Pt very agitated

## 2020-03-14 NOTE — Sedation Documentation (Signed)
Patient is resting comfortably. 

## 2020-03-14 NOTE — Progress Notes (Signed)
SLP Cancellation Note  Patient Details Name: Kimberly Howell MRN: [id number] DOB: June 13, 1946   Cancelled treatment:       Reason Eval/Treat Not Completed: Patient not medically ready (Pt remains on vent at this time. SLP will follow up. )  Kelilah Hebard I. Hardin Negus, [place], [place] Office number [phone] Pager [place] 03/14/2020, 12:55 PM

## 2020-03-14 NOTE — Sedation Documentation (Signed)
Vital signs stable. 

## 2020-03-14 NOTE — Telephone Encounter (Signed)
[place], Kimberly Howell, Lonn [place], Gilda Patients Cpap was picked up due to non compliance on 7/13   Estill Bamberg       Previous Messages   ----- Message -----  From: Harland German  Sent: 03/05/2020 10:23 AM EDT  To: Mauri Brooklyn  Subject: Download                     November 26, 1945  Order placed by Rexene Edison Np please advise. Thanks Chantel

## 2020-03-14 NOTE — Consult Note (Signed)
Chief Complaint: Patient was seen in consultation today for  Chief Complaint  Patient presents with  . Fall    Referring Physician(s): Dr. Tamala Julian   Supervising Physician: Dr. Serafina Royals   Patient Status: [facility] - In-pt  History of Present Illness: Kimberly Howell is a 74 y.o. female with a medical history significant for COPD (O2 dependent), CHF, DM2, HTN, CKD III and morbid obesity. She had been complaining of worsening shortness of breath and had seen her pulmonologist 03/02/20. The night of 03/03/20, her son heard a loud sound and found his mother face down in the bathroom, pulseless. He called 911 and began CPR which commenced for approximately 45 minutes before ROSC. She lost her pulse three more times. She was intubated and admitted to the ICU.   She continues to be ventilator dependent with a slow recovery. She had a tracheostomy placed 03/13/20 and had been receiving nutrition via a nasogastric tube until it was removed yesterday when her ETT was removed after tracheostomy.   Interventional Radiology has been asked to evaluate this patient for an image-guided gastrostomy tube for long-term nutritional access.    Allergies: Prednisone  Medications: Prior to Admission medications   Medication Sig Start Date End Date Taking? Authorizing Provider  acetaminophen (TYLENOL) 325 MG tablet Take 650 mg by mouth every 6 (six) hours as needed for moderate pain.   Yes [provider]  albuterol (VENTOLIN HFA) 108 (90 Base) MCG/ACT inhaler Inhale 2 puffs into the lungs every 6 (six) hours as needed for wheezing or shortness of breath.   Yes [provider]  amLODipine (NORVASC) 2.5 MG tablet Take 2.5 mg by mouth daily.   Yes [provider]  aspirin 81 MG EC tablet Take 81 mg by mouth daily. Swallow whole.   Yes [provider]  carvedilol (COREG) 25 MG tablet Take 25 mg by mouth 2 (two) times daily with a meal.   Yes [provider]  cetirizine  (ZYRTEC) 10 MG tablet Take 10 mg by mouth daily.   Yes [provider]  colchicine 0.6 MG tablet Take 0.6 mg by mouth daily.   Yes [provider]  diclofenac Sodium (VOLTAREN) 1 % GEL Apply 2 g topically 4 (four) times daily.   Yes [provider]  esomeprazole (NEXIUM) 40 MG capsule Take 40 mg by mouth daily at 12 noon.   Yes [provider]  febuxostat (ULORIC) 40 MG tablet Take 80 mg by mouth daily.    Yes [provider]  ferrous sulfate 325 (65 FE) MG tablet Take 325 mg by mouth 2 (two) times daily with a meal.   Yes [provider]  furosemide (LASIX) 20 MG tablet Take 20 mg by mouth.   Yes [provider]  glipiZIDE (GLUCOTROL XL) 2.5 MG 24 hr tablet Take 2.5 mg by mouth daily with breakfast.   Yes [provider]  HYDROcodone-acetaminophen (NORCO) 10-325 MG tablet Take 1 tablet by mouth every 6 (six) hours as needed.   Yes [provider]  linagliptin (TRADJENTA) 5 MG TABS tablet Take 5 mg by mouth daily.   Yes [provider]  lisinopril (ZESTRIL) 40 MG tablet Take 40 mg by mouth daily.   Yes [provider]  meclizine (ANTIVERT) 25 MG tablet Take 25 mg by mouth daily.   Yes [provider]  methocarbamol (ROBAXIN) 750 MG tablet Take 750 mg by mouth daily.    Yes [provider]  Multiple Vitamins-Minerals (CENTRAVITES  50 PLUS) TABS Take 1 tablet by mouth daily.   Yes [provider]  pramipexole (MIRAPEX) 0.125 MG tablet Take 0.125 mg by mouth 3 (three) times daily.   Yes [provider]  pravastatin (PRAVACHOL) 40 MG tablet Take 40 mg by mouth daily.   Yes [provider]  saxagliptin HCl (ONGLYZA) 2.5 MG TABS tablet Take 2.5 mg by mouth daily.   Yes [provider]     History reviewed. No pertinent family history.  Social History   Socioeconomic History  . Marital status: Married    Spouse name: Not on file  . Number of  children: Not on file  . Years of education: Not on file  . Highest education level: Not on file  Occupational History  . Not on file  Tobacco Use  . Smoking status: Former Smoker    Packs/day: 1.00    Years: 15.00    Pack years: 15.00    Quit date: 02/14/1990    Years since quitting: 30.0  . Smokeless tobacco: Never Used  Vaping Use  . Vaping Use: Never used  Substance and Sexual Activity  . Alcohol use: Not Currently  . Drug use: Never  . Sexual activity: Not on file  Other Topics Concern  . Not on file  Social History Narrative  . Not on file   Social Determinants of Health   Financial Resource Strain:   . Difficulty of Paying Living Expenses: Not on file  Food Insecurity:   . Worried About Charity fundraiser in the Last Year: Not on file  . Ran Out of Food in the Last Year: Not on file  Transportation Needs:   . Lack of Transportation (Medical): Not on file  . Lack of Transportation (Non-Medical): Not on file  Physical Activity:   . Days of Exercise per Week: Not on file  . Minutes of Exercise per Session: Not on file  Stress:   . Feeling of Stress : Not on file  Social Connections:   . Frequency of Communication with Friends and Family: Not on file  . Frequency of Social Gatherings with Friends and Family: Not on file  . Attends Religious Services: Not on file  . Active Member of Clubs or Organizations: Not on file  . Attends Archivist Meetings: Not on file  . Marital Status: Not on file    Review of Systems: A 12 point ROS discussed and pertinent positives are indicated in the HPI above.  All other systems are negative.  Review of Systems  Unable to perform ROS: Patient nonverbal    Vital Signs: BP (!) 149/77 (BP Location: Left Arm)   Pulse 85   Temp 99.7 F (37.6 C)   Resp (!) 25   Ht 5\' 8"  (1.727 m)   Wt 287 lb 4.2 oz (130.3 kg)   SpO2 99%   BMI 43.68 kg/m   Physical Exam Constitutional:      General: She is not in acute  distress.    Appearance: She is obese.     Comments: Tracheostomy/ventilator.   HENT:     Mouth/Throat:     Comments: Moderate amount of secretions.  Cardiovascular:     Rate and Rhythm: Normal rate and regular rhythm.     Pulses: Normal pulses.     Heart sounds: Normal heart sounds.  Pulmonary:     Comments: Tracheostomy/ventilator.  Abdominal:     Palpations: Abdomen is soft.     Comments: Hyperactive  bowel sounds.   Skin:    General: Skin is warm and dry.  Neurological:     Mental Status: She is alert.     Comments: Unable to fully assess. She was able to look at me and follow some commands.      Imaging: CT ABDOMEN WO CONTRAST  Addendum Date: 03/13/2020   ADDENDUM REPORT: 03/13/2020 20:11 ADDENDUM: Not mentioned above is a chronic partially calcified left upper pelvic 2.1 cm lesion on 66/3, likely an area of fat necrosis related to remote omental infarct. Electronically Signed   By: Abigail Miyamoto M.D.   On: 03/13/2020 20:11   Result Date: 03/13/2020 CLINICAL DATA:  Evaluate anatomy prior to G-tube placement. EXAM: CT ABDOMEN WITHOUT CONTRAST TECHNIQUE: Multidetector CT imaging of the abdomen was performed following the standard protocol without IV contrast. COMPARISON:  Plain film 03/09/2020.  08/11/2018 CT. FINDINGS: Lower chest: Limitations secondary to patient body habitus. Dependent right greater than left base airspace disease. Moderate cardiomegaly. Subcutaneous presternal edema and nodularity, including on 01/03. Hepatobiliary: Normal noncontrast appearance of the liver. Multiple dependent gallstones. No acute cholecystitis or biliary duct dilatation. Pancreas: Normal, without mass or ductal dilatation. Spleen: Normal in size, without focal abnormality. Adrenals/Urinary Tract: Normal adrenal glands. Mild renal cortical thinning bilaterally. No renal calculi or hydronephrosis. Stomach/Bowel: The proximal and mid stomach are underdistended. Apparent borderline wall thickening is  likely secondary. The gastric body is positioned just deep to the abdominal wall, without intervening bowel. Extensive colonic diverticulosis. Normal terminal ileum. 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 Example 71/3 today. Normal small bowel. Vascular/Lymphatic: Aortic atherosclerosis. Other: No abdominal ascites or free intraperitoneal air. Incompletely imaged prominent for age uterus. Diffuse anasarca. Musculoskeletal: Advanced lumbosacral spondylosis. IMPRESSION: 1. Limitations secondary to patient body habitus. 2.  No acute process in the abdomen or pelvis. 3. Cholelithiasis. 4. Bibasilar airspace disease, atelectasis versus infection or aspiration. 5. Anasarca with nonspecific presternal subcutaneous interstitial thickening and nodularity. Consider physical exam correlation. 6. Soft tissue density at the site of previously question mucocele of the appendix. Incompletely imaged on this abdominal exam. Please compared to 08/11/2018 abdominopelvic CT and interval surgical history. 7. Incompletely imaged prominent for age uterus, nonspecific. Electronically Signed: By: Abigail Miyamoto M.D. On: 03/13/2020 19:50   DG Chest 1 View  Result Date: 03/08/2020 CLINICAL DATA:  Hypoxia EXAM: CHEST  1 VIEW COMPARISON:  March 06, 2020 FINDINGS: Endotracheal tube tip is 1.3 cm above the carina. Central catheter tip is at the junction of the right innominate vein and superior vena cava. Enteric tube tip is below the diaphragm. No pneumothorax. There is patchy atelectatic change in the lung bases, slightly increased. No consolidation or edema evident. Heart is enlarged with pulmonary vascularity normal. No adenopathy appreciable. There is aortic atherosclerosis. Arthropathy noted in each shoulder. IMPRESSION: Tube and catheter positions as described without pneumothorax. Bibasilar atelectasis, slightly increased from recent study. Stable  cardiac prominence. Aortic Atherosclerosis (ICD10-I70.0). Electronically Signed   By: Lowella Grip III M.D.   On: 03/08/2020 08:13   DG Abd 1 View  Result Date: 03/09/2020 CLINICAL DATA:  Orogastric tube positioning EXAM: ABDOMEN - 1 VIEW COMPARISON:  March 07, 2020 FINDINGS: Orogastric tube tip and side port in mid to distal stomach. No bowel dilatation or air-fluid level to suggest bowel obstruction. No free air. Bibasilar lung atelectasis. IMPRESSION: Nasogastric tube tip and side port in stomach. No demonstrable bowel obstruction or  free air on supine examination. Electronically Signed   By: Lowella Grip III M.D.   On: 03/09/2020 08:09   DG Abd 1 View  Result Date: 03/07/2020 CLINICAL DATA:  Ileus. EXAM: ABDOMEN - 1 VIEW COMPARISON:  March 03, 2020. FINDINGS: The bowel gas pattern is normal. Nasogastric tube tip is seen in expected position of distal stomach. No radio-opaque calculi or other significant radiographic abnormality are seen. IMPRESSION: No evidence of bowel obstruction or ileus. Electronically Signed   By: Marijo Conception M.D.   On: 03/07/2020 11:45   CT HEAD WO CONTRAST  Result Date: 03/03/2020 CLINICAL DATA:  Trauma.  Fall with head trauma.  Status post CPR. EXAM: CT HEAD WITHOUT CONTRAST CT MAXILLOFACIAL WITHOUT CONTRAST CT CERVICAL SPINE WITHOUT CONTRAST TECHNIQUE: Multidetector CT imaging of the head, cervical spine, and maxillofacial structures were performed using the standard protocol without intravenous contrast. Multiplanar CT image reconstructions of the cervical spine and maxillofacial structures were also generated. COMPARISON:  10/18/2018 FINDINGS: CT HEAD FINDINGS Brain: There is no mass, hemorrhage or extra-axial collection. The size and configuration of the ventricles and extra-axial CSF spaces are normal. The brain parenchyma is normal, without evidence of acute or chronic infarction. Vascular: No abnormal hyperdensity of the major intracranial  arteries or dural venous sinuses. No intracranial atherosclerosis. Skull: Small left frontal scalp hematoma.  No skull fracture. CT MAXILLOFACIAL FINDINGS Osseous: --Complex facial fracture types: No LeFort, zygomaticomaxillary complex or nasoorbitoethmoidal fracture. --Simple fracture types: There is a fracture of the left lamina papyracea with herniation of the left medial rectus muscle into the ethmoid sinus. --Mandible: No fracture or dislocation. Orbits: There is gas within the left orbit. There is a small amount of retro bulbar stranding, possibly hemorrhage. Sinuses: Left ethmoid sinus is opacified, partially with herniated left orbital contents. Soft tissues: Normal visualized extracranial soft tissues. CT CERVICAL SPINE FINDINGS Alignment: No static subluxation. Facets are aligned. Occipital condyles and the lateral masses of C1-C2 are aligned. Skull base and vertebrae: No acute fracture. Soft tissues and spinal canal: No prevertebral fluid or swelling. No visible canal hematoma. Disc levels: No advanced spinal canal or neural foraminal stenosis. Upper chest: Large area of consolidation at the right lung apex. Other: Normal visualized paraspinal cervical soft tissues. IMPRESSION: 1. No acute intracranial abnormality. 2. No acute fracture or static subluxation of the cervical spine. 3. Small left frontal scalp hematoma without skull fracture. 4. Fracture of the left lamina papyracea with herniation of the left medial rectus muscle into the ethmoid sinus. Small amount of retro bulbar stranding, likely hemorrhage. 5. Right lung apex consolidation concerning for pneumonia. Electronically Signed   By: Ulyses Jarred M.D.   On: 03/03/2020 06:18   CT Cervical Spine Wo Contrast  Result Date: 03/03/2020 CLINICAL DATA:  Trauma.  Fall with head trauma.  Status post CPR. EXAM: CT HEAD WITHOUT CONTRAST CT MAXILLOFACIAL WITHOUT CONTRAST CT CERVICAL SPINE WITHOUT CONTRAST TECHNIQUE: Multidetector CT imaging of the  head, cervical spine, and maxillofacial structures were performed using the standard protocol without intravenous contrast. Multiplanar CT image reconstructions of the cervical spine and maxillofacial structures were also generated. COMPARISON:  10/18/2018 FINDINGS: CT HEAD FINDINGS Brain: There is no mass, hemorrhage or extra-axial collection. The size and configuration of the ventricles and extra-axial CSF spaces are normal. The brain parenchyma is normal, without evidence of acute or chronic infarction. Vascular: No abnormal hyperdensity of the major intracranial arteries or dural venous sinuses. No intracranial atherosclerosis. Skull: Small left frontal scalp hematoma.  No skull fracture. CT MAXILLOFACIAL FINDINGS Osseous: --Complex facial fracture types: No LeFort, zygomaticomaxillary complex or nasoorbitoethmoidal fracture. --Simple fracture types: There is a fracture of the left lamina papyracea with herniation of the left medial rectus muscle into the ethmoid sinus. --Mandible: No fracture or dislocation. Orbits: There is gas within the left orbit. There is a small amount of retro bulbar stranding, possibly hemorrhage. Sinuses: Left ethmoid sinus is opacified, partially with herniated left orbital contents. Soft tissues: Normal visualized extracranial soft tissues. CT CERVICAL SPINE FINDINGS Alignment: No static subluxation. Facets are aligned. Occipital condyles and the lateral masses of C1-C2 are aligned. Skull base and vertebrae: No acute fracture. Soft tissues and spinal canal: No prevertebral fluid or swelling. No visible canal hematoma. Disc levels: No advanced spinal canal or neural foraminal stenosis. Upper chest: Large area of consolidation at the right lung apex. Other: Normal visualized paraspinal cervical soft tissues. IMPRESSION: 1. No acute intracranial abnormality. 2. No acute fracture or static subluxation of the cervical spine. 3. Small left frontal scalp hematoma without skull fracture. 4.  Fracture of the left lamina papyracea with herniation of the left medial rectus muscle into the ethmoid sinus. Small amount of retro bulbar stranding, likely hemorrhage. 5. Right lung apex consolidation concerning for pneumonia. Electronically Signed   By: Ulyses Jarred M.D.   On: 03/03/2020 06:18   MR BRAIN WO CONTRAST  Addendum Date: 03/11/2020   ADDENDUM REPORT: 03/11/2020 16:32 ADDENDUM: Findings of an acute fracture of the left fovea ethmoidalis with herniation of orbital fat intracranially discussed with Dr. Rory Percy by telphone at 4:30 p.m. on 03/11/2020. Electronically Signed   By: Kellie Simmering DO   On: 03/11/2020 16:32   Result Date: 03/11/2020 CLINICAL DATA:  Provided INDICATION: Anoxic brain damage. EXAM: MRI HEAD WITHOUT CONTRAST TECHNIQUE: Multiplanar, multiecho pulse sequences of the brain and surrounding structures were obtained without intravenous contrast. COMPARISON:  CT head/maxillofacial 03/03/2020. FINDINGS: Brain: Cerebral volume is normal for age. In addition to the acute medially displaced fracture of the left lamina papyracea described on the maxillofacial CT of 03/03/2020, there is also an acute displaced fracture of the left fovea ethmoidalis. Herniation of orbital fat and of the left medial rectus muscle into the left ethmoid sinus. Additionally, orbital fat extends from the left ethmoid sinus through the fracture defect in the fovea ethmoidalis intracranially into the left anterior cranial fossa. The orbital fat extending intracranially measures 0.9 x 1.0 cm and exerts slight mass effect upon the anteroinferior left frontal lobe (for instance as seen on series 8, image 23) (series 4, image 15). There are small foci of apparent diffusion weighted hyperintensity within the paramedian frontoparietal lobes. These predominantly appear to reflect susceptibility artifact from adjacent prominent dural calcifications. However, two punctate acute infarcts within the paramedian right frontal  lobe are difficult to exclude (series 2, images 43 and 35). There is probable subtle diffuse diffusion-weighted signal abnormality throughout the cerebellum. Subtle symmetric diffusion-weighted signal abnormality is also questioned within the medial temporal lobes/hippocampi (for instance as seen on series 3, image 17). These findings are highly suspicious for hypoxic/ischemic injury. Mild multifocal T2/FLAIR hyperintensity within the cerebral white matter is nonspecific, but consistent with chronic small vessel ischemic disease. No definite chronic intracranial blood products are identified. No evidence of intracranial mass. No extra-axial fluid collection. No midline shift. Vascular: Expected proximal arterial flow voids. Skull and upper cervical spine: No focal marrow lesion. Sinuses/Orbits: Visualized orbits show no acute finding. Mucosal thickening and air-fluid level within the left  maxillary sinus. Herniation of the left medial rectus muscle and extensive herniation of orbital fat into the left ethmoid sinuses. Mild ethmoid and right maxillary sinus mucosal thickening. Large bilateral mastoid effusions. Other: Redemonstrated frontal scalp and left maxillofacial soft tissue swelling/hematoma. These results will be called to the ordering clinician or representative by the Radiologist Assistant, and communication documented in the PACS or [employer]. IMPRESSION: Probable subtle diffuse diffusion-weighted signal abnormality throughout the cerebellum. Subtle symmetric diffusion-weighted signal abnormality is also questioned within the medial temporal lobes/hippocampi. These findings are highly suspicious for hypoxic/ischemic injury. Small foci of apparent diffusion weighted hyperintensity within the paramedian frontoparietal lobes appear to predominant reflect susceptibility artifact from adjacent dural calcification. However, two punctate acute infarcts within the paramedian right frontal lobe are difficult  to exclude. Medially displaced acute fracture of the left lamina papyracea. There is also an acute, displaced fracture of the left fovea ethmoidalis. Orbital fat and the medial rectus muscle extend into the left ethmoid sinus. Additionally, orbital fat extends from the left ethmoid sinus through the fracture defect in the fovea ethmoidalis intracranially into the left anterior cranial fossa. The intracranial component of orbital fat measures 1 cm and exerts mild local mass effect upon the anteroinferior left frontal lobe. Mild cerebral white matter chronic small vessel ischemic disease. Electronically Signed: By: Kellie Simmering DO On: 03/09/2020 18:14   DG Pelvis Portable  Result Date: 03/03/2020 CLINICAL DATA:  Fall EXAM: PORTABLE PELVIS 1-2 VIEWS COMPARISON:  None. FINDINGS: Limited visibility of the left hip. There is no evidence of pelvic fracture or diastasis. No pelvic bone lesions are seen. IMPRESSION: Negative. Electronically Signed   By: Ulyses Jarred M.D.   On: 03/03/2020 05:46   DG Chest Port 1 View  Result Date: 03/13/2020 CLINICAL DATA:  Tracheostomy placement EXAM: PORTABLE CHEST 1 VIEW COMPARISON:  March 08, 2020 FINDINGS: Tracheostomy now present with tip 3.7 cm above the carina. Extensive subcutaneous air in the neck and supraclavicular soft tissues is likely due to recent tracheostomy placement. Central catheter tip is in the left innominate vein. No pneumothorax appreciable. There is consolidation in the left lower lobe with small left pleural effusion. There is atelectatic change in the lung bases as well. There is an equivocal right pleural effusion. Heart is enlarged with pulmonary vascularity normal. No adenopathy. There is aortic atherosclerosis. There is extensive arthropathy in the shoulders. IMPRESSION: Tracheostomy now present. Extensive soft tissue air in the neck and supraclavicular regions is likely of postoperative etiology. No pneumothorax evident. Left lower lobe airspace  consolidation which in part is likely due to atelectatic change. A degree of underlying pneumonia in the left base cannot be excluded. Small pleural effusion bilaterally with bibasilar atelectasis. Stable cardiomegaly. Aortic Atherosclerosis (ICD10-I70.0). Electronically Signed   By: Lowella Grip III M.D.   On: 03/13/2020 09:42   DG CHEST PORT 1 VIEW  Result Date: 03/06/2020 CLINICAL DATA:  74 year old female with respiratory failure. Recent fall with head trauma. Status post CPR. Recently negative for COVID-19. EXAM: PORTABLE CHEST 1 VIEW COMPARISON:  Portable chest 0519 hours today and earlier. FINDINGS: Portable AP semi upright view at 0922 hours. Endotracheal tube tip near the carina and directed toward the right mainstem bronchus now. This projects about 2 cm below the clavicles. New left IJ approach central line, tip projects at the lateral right mediastinum at the level of the SVC. Stable enteric tube coursing to the abdomen, tip not included. Continued low lung volumes. Stable cardiac size and mediastinal contours. No  pneumothorax or pleural effusion. Fine diffuse bilateral pulmonary interstitial opacity persists with improved bilateral ventilation since 03/03/2020. No areas of worsening ventilation. Paucity of bowel gas in the upper abdomen. IMPRESSION: 1. New left IJ central line, tip at the SVC level. 2. Endotracheal tube tip near the carina and directed toward the right mainstem bronchus. Recommend retraction of 0.5 to 1 cm. Attention directed on follow-up. 3. No pneumothorax. Continued low lung volumes with improved bilateral ventilation since 03/03/2020. Electronically Signed   By: Genevie Ann M.D.   On: 03/06/2020 09:36   DG Chest Port 1 View  Result Date: 03/06/2020 CLINICAL DATA:  Intubation.  Respiratory failure. EXAM: PORTABLE CHEST 1 VIEW COMPARISON:  03/03/2020. FINDINGS: Endotracheal NG tube stable position. Stable cardiomegaly. Diffuse bilateral pulmonary infiltrates/edema again noted  with interim improvement from prior exam. No prominent pleural effusion. No pneumothorax. IMPRESSION: 1.  Lines and tubes stable position. 2.  Stable cardiomegaly. 3. Diffuse bilateral pulmonary infiltrates/edema again noted with interim improvement from prior exam. Electronically Signed   By: Marcello Moores  Register   On: 03/06/2020 06:39   DG CHEST PORT 1 VIEW  Result Date: 03/03/2020 CLINICAL DATA:  74 year old female with a history of endotracheal tube placement EXAM: PORTABLE CHEST 1 VIEW COMPARISON:  03/03/2020 FINDINGS: Cardiomediastinal silhouette likely unchanged, with the heart borders partially obscured by overlying lung/pleural disease. Mixed interstitial and airspace disease throughout the bilateral lungs. This appears worse on the right in the lower right lung, with decreasing aeration. Endotracheal tube has been withdrawn, and now terminates at the clavicular heads estimated 3.5 cm above the carina. Gastric tube traverses the mediastinum and terminates out of the field of view. No pneumothorax. IMPRESSION: The endotracheal tube has been withdrawn and now terminates at the clavicular heads estimated 3.5 cm above the carina. Bilateral mixed interstitial and airspace opacities, appearing worse at the right lung base Electronically Signed   By: Corrie Mckusick D.O.   On: 03/03/2020 12:35   DG Chest Port 1 View  Result Date: 03/03/2020 CLINICAL DATA:  Acute respiratory failure. EXAM: PORTABLE CHEST 1 VIEW COMPARISON:  03/03/2020 FINDINGS: Again noted is tip of ET tube approaching the right mainstem bronchus. This appears unchanged from previous exam. NG tube tip is below the field of view. Stable cardiomediastinal contours. Aortic atherosclerotic calcifications. Bilateral multifocal interstitial and airspace disease is again noted and appears unchanged. IMPRESSION: 1. No change in aeration to the lungs compared with previous exam. 2. Stable support apparatus. 3. The tip of the ET tube appears to approach  the right mainstem bronchus as noted previously. Retraction by 2 cm is advised. 4. These results will be called to the ordering clinician or representative by the Radiologist Assistant, and communication documented in the PACS or [employer]. Electronically Signed   By: Kerby Moors M.D.   On: 03/03/2020 09:02   DG Chest Port 1 View  Result Date: 03/03/2020 CLINICAL DATA:  Fall with head trauma EXAM: PORTABLE CHEST 1 VIEW COMPARISON:  None. FINDINGS: Bilateral upper lobe predominant opacities. Otherwise limited information due to patient being image while on a backboard. The carina is not adequately visualized. The endotracheal tube tip may be approaching the right mainstem bronchus. IMPRESSION: 1. Endotracheal tube tip may be approaching the right mainstem bronchus. Retraction by 2 cm recommended. 2. Bilateral upper lobe predominant opacities. Electronically Signed   By: Ulyses Jarred M.D.   On: 03/03/2020 05:45   DG Abd Portable 1 View  Result Date: 03/03/2020 CLINICAL DATA:  Enteric tube EXAM:  PORTABLE ABDOMEN - 1 VIEW COMPARISON:  March 03, 2020 FINDINGS: Nonobstructive bowel gas pattern. Incomplete visualization of the lung bases. Enteric tube tip and side port project over the stomach. Degenerative changes of the lower lumbar spine. IMPRESSION: Enteric tube tip and side port project over the stomach. Electronically Signed   By: Valentino Saxon MD   On: 03/03/2020 07:46   EEG adult  Result Date: 03/04/2020 Greta Doom, MD     03/04/2020  6:13 PM History: 74 year old female being monitored as part of normothermia protocol Sedation: Fentanyl and propofol(20 mcg/kg/min) Technique: This is a 21 channel routine scalp EEG performed at the bedside with bipolar and monopolar montages arranged in accordance to the international 10/20 system of electrode placement. One channel was dedicated to EKG recording. Background: With stimulation, there does appear to be a posterior dominant  rhythm seen briefly with a frequency of 8 to 9 Hz.  In addition, there is intrusion of diffuse theta and delta activities.  Following the first few minutes, there is diffuse muscle artifact largely obscuring the EEG, but no definite underlying changes able to be appreciated. Photic stimulation: Physiologic driving is not performed EEG Abnormalities: 1) generalized irregular slow activities Clinical Interpretation: This EEG is consistent with a generalized nonspecific cerebral dysfunction (encephalopathy). There was no seizure or seizure predisposition recorded on this study. Please note that lack of epileptiform activity on EEG does not preclude the possibility of epilepsy. Roland Rack, MD [facility] [phone] If 7pm- 7am, please page neurology on call as listed in [facility].   Overnight EEG with video  Result Date: 03/05/2020 Lora Havens, MD     03/05/2020 11:25 AM Patient Name: Kimberly Howell MRN: [id number] Epilepsy Attending: Lora Havens Referring Physician/Provider: Dr Marcello Fennel Duration: 03/04/2020 910 to 03/05/2020 0939 Patient history: 74yo M s/p cardiac arrest on TTM. EEG to evaluate for seizure Level of alertness:  comatose AEDs during EEG study: None Technical aspects: This EEG study was done with scalp electrodes positioned according to the 10-20 International system of electrode placement. Electrical activity was acquired at a sampling rate of 500Hz  and reviewed with a high frequency filter of 70Hz  and a low frequency filter of 1Hz . EEG data were recorded continuously and digitally stored. Description: EEG showed continuous generalized polymorphic low amplitude 3 to 6 Hz theta-delta slowing. EEG was reactive to noxious stimulation. Hyperventilation and photic stimulation were not performed.   ABNORMALITY -Continous slow, generalized IMPRESSION: This study is suggestive of severe diffuse encephalopathy, nonspecific etiology . No seizures or epileptiform discharges  were seen throughout the recording. Lora Havens   ECHOCARDIOGRAM COMPLETE  Result Date: 03/03/2020    ECHOCARDIOGRAM REPORT   Patient Name:   SHELBEE APGAR Jollie Date of Exam: 03/03/2020 Medical Rec #:  [id number]       Height:       64.0 in Accession #:    [id number]      Weight:       320.0 lb Date of Birth:  10-Jun-1946      BSA:          2.389 m Patient Age:    29 years        BP:           121/74 mmHg Patient Gender: F               HR:           60 bpm. Exam Location:  Inpatient Procedure: 2D Echo, Cardiac Doppler  and Color Doppler Indications:    Cardiac Arrest I46.9  History:        Patient has no prior history of Echocardiogram examinations.  Sonographer:    Tiffany Dance Referring Phys: [id number] CHRISTOPHER R DOROTHY  Sonographer Comments: Suboptimal subcostal window, echo performed with patient supine and on artificial respirator and patient is morbidly obese. IMPRESSIONS  1. Left ventricular ejection fraction, by estimation, is 60 to 65%. The left ventricle has normal function. The left ventricle has no regional wall motion abnormalities. Left ventricular diastolic parameters are consistent with Grade I diastolic dysfunction (impaired relaxation).  2. Right ventricular systolic function is normal. The right ventricular size is normal.  3. The mitral valve is normal in structure. No evidence of mitral valve regurgitation. No evidence of mitral stenosis.  4. The aortic valve is normal in structure. Aortic valve regurgitation is not visualized. Mild aortic valve sclerosis is present, with no evidence of aortic valve stenosis.  5. The inferior vena cava is normal in size with greater than 50% respiratory variability, suggesting right atrial pressure of 3 mmHg. FINDINGS  Left Ventricle: Left ventricular ejection fraction, by estimation, is 60 to 65%. The left ventricle has normal function. The left ventricle has no regional wall motion abnormalities. The left ventricular internal cavity size was normal in  size. There is  no left ventricular hypertrophy. Left ventricular diastolic parameters are consistent with Grade I diastolic dysfunction (impaired relaxation). Right Ventricle: The right ventricular size is normal. No increase in right ventricular wall thickness. Right ventricular systolic function is normal. Left Atrium: Left atrial size was normal in size. Right Atrium: Right atrial size was normal in size. Pericardium: There is no evidence of pericardial effusion. Mitral Valve: The mitral valve is normal in structure. No evidence of mitral valve regurgitation. No evidence of mitral valve stenosis. Tricuspid Valve: The tricuspid valve is normal in structure. Tricuspid valve regurgitation is not demonstrated. No evidence of tricuspid stenosis. Aortic Valve: The aortic valve is normal in structure. Aortic valve regurgitation is not visualized. Mild aortic valve sclerosis is present, with no evidence of aortic valve stenosis. Pulmonic Valve: The pulmonic valve was normal in structure. Pulmonic valve regurgitation is not visualized. No evidence of pulmonic stenosis. Aorta: The aortic root is normal in size and structure. Venous: The inferior vena cava is normal in size with greater than 50% respiratory variability, suggesting right atrial pressure of 3 mmHg. IAS/Shunts: No atrial level shunt detected by color flow Doppler.  LEFT VENTRICLE PLAX 2D LVIDd:         3.81 cm  Diastology LVIDs:         2.28 cm  LV e' medial:    5.98 cm/s LV PW:         1.08 cm  LV E/e' medial:  14.6 LV IVS:        0.89 cm  LV e' lateral:   8.70 cm/s LVOT diam:     1.80 cm  LV E/e' lateral: 10.0 LV SV:         66 LV SV Index:   28 LVOT Area:     2.54 cm  RIGHT VENTRICLE             IVC RV S prime:     13.30 cm/s  IVC diam: 2.05 cm TAPSE (M-mode): 2.6 cm LEFT ATRIUM             Index LA diam:        4.00 cm 1.67 cm/m LA  Vol [facility]):   46.1 ml 19.30 ml/m LA Vol (A4C):   30.7 ml 12.85 ml/m LA Biplane Vol: 39.5 ml 16.53 ml/m  AORTIC VALVE LVOT  Vmax:   97.90 cm/s LVOT Vmean:  62.800 cm/s LVOT VTI:    0.260 m  AORTA Ao Root diam: 3.00 cm Ao Asc diam:  3.10 cm MITRAL VALVE MV Area (PHT): 2.36 cm     SHUNTS MV Decel Time: 322 msec     Systemic VTI:  0.26 m MV E velocity: 87.30 cm/s   Systemic Diam: 1.80 cm MV A velocity: 118.00 cm/s MV E/A ratio:  0.74 Candee Furbish MD Electronically signed by Candee Furbish MD Signature Date/Time: 03/03/2020/4:21:06 PM    Final    CT Maxillofacial Wo Contrast  Result Date: 03/03/2020 CLINICAL DATA:  Trauma.  Fall with head trauma.  Status post CPR. EXAM: CT HEAD WITHOUT CONTRAST CT MAXILLOFACIAL WITHOUT CONTRAST CT CERVICAL SPINE WITHOUT CONTRAST TECHNIQUE: Multidetector CT imaging of the head, cervical spine, and maxillofacial structures were performed using the standard protocol without intravenous contrast. Multiplanar CT image reconstructions of the cervical spine and maxillofacial structures were also generated. COMPARISON:  10/18/2018 FINDINGS: CT HEAD FINDINGS Brain: There is no mass, hemorrhage or extra-axial collection. The size and configuration of the ventricles and extra-axial CSF spaces are normal. The brain parenchyma is normal, without evidence of acute or chronic infarction. Vascular: No abnormal hyperdensity of the major intracranial arteries or dural venous sinuses. No intracranial atherosclerosis. Skull: Small left frontal scalp hematoma.  No skull fracture. CT MAXILLOFACIAL FINDINGS Osseous: --Complex facial fracture types: No LeFort, zygomaticomaxillary complex or nasoorbitoethmoidal fracture. --Simple fracture types: There is a fracture of the left lamina papyracea with herniation of the left medial rectus muscle into the ethmoid sinus. --Mandible: No fracture or dislocation. Orbits: There is gas within the left orbit. There is a small amount of retro bulbar stranding, possibly hemorrhage. Sinuses: Left ethmoid sinus is opacified, partially with herniated left orbital contents. Soft tissues: Normal  visualized extracranial soft tissues. CT CERVICAL SPINE FINDINGS Alignment: No static subluxation. Facets are aligned. Occipital condyles and the lateral masses of C1-C2 are aligned. Skull base and vertebrae: No acute fracture. Soft tissues and spinal canal: No prevertebral fluid or swelling. No visible canal hematoma. Disc levels: No advanced spinal canal or neural foraminal stenosis. Upper chest: Large area of consolidation at the right lung apex. Other: Normal visualized paraspinal cervical soft tissues. IMPRESSION: 1. No acute intracranial abnormality. 2. No acute fracture or static subluxation of the cervical spine. 3. Small left frontal scalp hematoma without skull fracture. 4. Fracture of the left lamina papyracea with herniation of the left medial rectus muscle into the ethmoid sinus. Small amount of retro bulbar stranding, likely hemorrhage. 5. Right lung apex consolidation concerning for pneumonia. Electronically Signed   By: Ulyses Jarred M.D.   On: 03/03/2020 06:18    Labs:  CBC: Recent Labs    03/10/20 0435 03/11/20 0421 03/12/20 0456 03/13/20 0417  WBC 12.4* 12.1* 10.5 13.5*  HGB 6.8* 7.1* 7.4* 7.7*  HCT 23.0* 23.9* 25.2* 25.6*  PLT 221 211 236 267    COAGS: Recent Labs    03/03/20 0524 03/03/20 0821 03/03/20 1917 03/13/20 0417  INR 1.2 1.1 1.1 1.2  APTT  --  32  --   --     BMP: Recent Labs    03/10/20 0435 03/11/20 0421 03/12/20 0456 03/13/20 0417  NA 148* 149* 147* 146*  K 4.2 3.5 3.5 3.9  CL  101 100 100 102  CO2 33* 33* 33* 29  GLUCOSE 116* 111* 132* 100*  BUN 81* 84* 80* 80*  CALCIUM 8.5* 8.6* 8.7* 8.5*  CREATININE 3.09* 3.39* 3.12* 2.86*  GFRNONAA 14* 13* 14* 16*  GFRAA 17* 15* 17* 18*    LIVER FUNCTION TESTS: Recent Labs    03/03/20 0524 03/06/20 0752  BILITOT 0.9 0.8  AST 292* 90*  ALT 247* 118*  ALKPHOS 92 80  PROT 5.9* 6.2*  ALBUMIN 2.5* 2.3*    TUMOR MARKERS: No results for input(s): AFPTM, CEA, CA199, CHROMGRNA in the last 8760  hours.  Assessment and Plan:  Cardiac arrest with hypoxic and hypercarbic respiratory failure; s/p tracheostomy: Frazier Richards, 74 year old female, is tentatively scheduled to be seen 03/14/20 at the [facility] department for an image-guided gastrostomy tube for long-term nutritional access.   Risks and benefits of image-guided gastrostomy tube placement were discussed with the patient's son including, but not limited to the need for a barium enema during the procedure, bleeding, infection, peritonitis and/or damage to adjacent structures.  All of the patient's son's questions were answered, patient's son is agreeable to proceed.  Consent signed and in the IR control room.  Thank you for this interesting consult.  I greatly enjoyed meeting RITHA SAMPEDRO and look forward to participating in their care.  A copy of this report was sent to the requesting provider on this date.  Electronically Signed: Soyla Dryer, AGACNP-BC [phone] 03/14/2020, 9:34 AM   I spent a total of 20 Minutes    in face to face in clinical consultation, greater than 50% of which was counseling/coordinating care for gastrostomy tube placement.

## 2020-03-14 NOTE — Progress Notes (Signed)
Pt transported from [facility] [facility] to IR and back with no complications noted.

## 2020-03-14 NOTE — Sedation Documentation (Signed)
Gastric tube placed, pt tolerated procedure very well.

## 2020-03-14 NOTE — Telephone Encounter (Signed)
Kimberly Howell, DME can not send the DL as you requested bc pt's CPAP was picked up in July due to non compliance- FYI

## 2020-03-14 NOTE — Progress Notes (Signed)
Patient ID: Kimberly Howell, female   DOB: May 27, 1946, 74 y.o.   MRN: [id number]  she is more awake and opthal has indicated they find no entrapment. It looks like to me as well there is no movement issues with the eye. She cannot communicate. Neurosurgery does not recommend intervention for the orbital fat into the anterior fossa but that needs to be documented in chart by neurosurgery.  I would recommend observation for this now that it is 2 weeks out and she appears to not have any clinical issues. She certainly is a anesthesia risk and not enough clinical indications that there would be any benefit.  She should follow up with Dr Talbert Forest to follow for enopthalmus.

## 2020-03-14 NOTE — Evaluation (Signed)
Occupational Therapy Evaluation Patient Details Name: Kimberly Howell MRN: [id number] DOB: 1946-02-21 Today's Date: 03/14/2020    History of Present Illness 74 yo admitted with cardiac arrest and fall on 9/18 s/p CPR with resultant left orbit fx. Intubated 9/18, trach 9/ 28. PMhx: morbid obesity, HTN, respiratory failure on 3L, CKD stage 4   Clinical Impression   No family available to confirm, but pt indicates with walked with a RW and was able to perform self care independently prior to admission. She lives with her son. Pt presents with significant, global weakness. He demonstrates poor attention and follows commands with increased time and inconsistently. She is dependent in bed level mobility and all ADL. Pt attempted to mouth words, but unintelligible. She indicated pain with shoulder and knee ROM. Will follow acutely. Pt likely to need extensive, long term rehab.     Follow Up Recommendations  LTACH;Supervision/Assistance - 24 hour    Equipment Recommendations  Other (comment) (defer to next venue)    Recommendations for Other Services       Precautions / Restrictions Precautions Precautions: Fall;Other (comment) Precaution Comments: trach, vent, PEG planned 9/29 Restrictions Weight Bearing Restrictions: No      Mobility Bed Mobility Overal bed mobility: Needs Assistance Bed Mobility: Supine to Sit           General bed mobility comments: pt positioned into chair position via bed. Pt unable to assist with pulling trunk off surface with max +2 assist and unable to significantly move arms or legs to assist with mobility  Transfers                      Balance Overall balance assessment: Needs assistance   Sitting balance-Leahy Scale: Zero Sitting balance - Comments: left lean in sitting                                   ADL either performed or assessed with clinical judgement   ADL                                          General ADL Comments: dependent in all aspects     Vision Baseline Vision/History: Wears glasses Wears Glasses: Reading only Additional Comments: unable to accurately assess due to cognition, but vision appears grossly intact     Perception     Praxis      Pertinent Vitals/Pain Pain Assessment: Faces Pain Score: 4  Faces Pain Scale: Hurts little more Pain Location: bil shoulder motion grimace Pain Descriptors / Indicators: Grimacing Pain Intervention(s): Monitored during session;Repositioned     Hand Dominance Right   Extremity/Trunk Assessment Upper Extremity Assessment Upper Extremity Assessment: RUE deficits/detail;LUE deficits/detail RUE Deficits / Details: pain response to shoulder ROM >90 degrees, full PROM elbow to hand, edematous RUE Coordination: decreased fine motor;decreased gross motor LUE Deficits / Details: pain response with shoulder ROM >90 degrees, full PROM elbow to hand, edematous LUE Coordination: decreased fine motor;decreased gross motor   Lower Extremity Assessment Lower Extremity Assessment: RLE deficits/detail;LLE deficits/detail RLE Deficits / Details: pt able to wiggle toes, no active dorsiflexion/plantarflexion. 2-/5 knee extension, no active hip or knee flexion LLE Deficits / Details: pt able to wiggle toes, 2-/5 ankle dorsiflexion/plantarflexion. 2-/5 knee extension, no active hip or knee flexion   Cervical / Trunk  Assessment Cervical / Trunk Assessment: Other exceptions Cervical / Trunk Exceptions: significant weakness, obesity   Communication Communication Communication: Tracheostomy (attempts to mouth words)   Cognition Arousal/Alertness: Awake/alert Behavior During Therapy: Flat affect Overall Cognitive Status: Impaired/Different from baseline Area of Impairment: Following commands;Safety/judgement;Attention                   Current Attention Level: Focused   Following Commands: Follows one step commands  inconsistently;Follows one step commands with increased time       General Comments: pt with delayed response to questions and commands, decreased attention to right. Pt unable to accurately answer location when given choices   General Comments       Exercises General Exercises - Lower Extremity Short Arc QuadSinclair Ship;Both;5 reps;Seated Heel Slides: PROM;Both;Seated (3 reps)   Shoulder Instructions      Home Living Family/patient expects to be discharged to:: Private residence Living Arrangements: Children Available Help at Discharge: Family                                    Prior Functioning/Environment Level of Independence: Independent with assistive device(s)        Comments: pt nodding head in response to being able bath, dress and walk with RW, assist for homemaking. Pt unable to further detail home setup        OT Problem List: Decreased strength;Decreased activity tolerance;Impaired balance (sitting and/or standing);Decreased cognition;Decreased coordination;Impaired UE functional use;Pain;Obesity;Cardiopulmonary status limiting activity      OT Treatment/Interventions: Therapeutic exercise;Therapeutic activities;Cognitive remediation/compensation;Patient/family education;Balance training    OT Goals(Current goals can be found in the care plan section) Acute Rehab OT Goals OT Goal Formulation: Patient unable to participate in goal setting Time For Goal Achievement: 03/28/20 Potential to Achieve Goals: Fair ADL Goals Additional ADL Goal #1: Pt will demonstrate sustained attention during therapeutic activities. Additional ADL Goal #2: Pt will follow one step commands with 50% accuracy. Additional ADL Goal #3: Pt will perform hand to mouth/face with both UEs as a precursor to ADL. Additional ADL Goal #4: Pt will roll with +2 mod assist for pericare and positioning.  OT Frequency: Min 2X/week   Barriers to D/C:            Co-evaluation PT/OT/SLP  Co-Evaluation/Treatment: Yes Reason for Co-Treatment: Complexity of the patient's impairments (multi-system involvement);For patient/therapist safety PT goals addressed during session: Mobility/safety with mobility;Strengthening/ROM OT goals addressed during session: Strengthening/ROM      AM-PAC OT "6 Clicks" Daily Activity     Outcome Measure Help from another person eating meals?: Total Help from another person taking care of personal grooming?: Total Help from another person toileting, which includes using toliet, bedpan, or urinal?: Total Help from another person bathing (including washing, rinsing, drying)?: Total Help from another person to put on and taking off regular upper body clothing?: Total Help from another person to put on and taking off regular lower body clothing?: Total 6 Click Score: 6   End of Session    Activity Tolerance: Patient tolerated treatment well Patient left: in bed;with call bell/phone within reach;with nursing/sitter in room  OT Visit Diagnosis: Muscle weakness (generalized) (M62.81);Pain;History of falling (Z91.81);Other symptoms and signs involving cognitive function                Time: 6294-7654 OT Time Calculation (min): 21 min Charges:  OT General Charges $OT Visit: 1 Visit OT Evaluation $OT Eval High  Complexity: 1 High  Nestor Lewandowsky, OTR/L Acute Rehabilitation Services Pager: [phone] Office: [phone] [name] 03/14/2020, 11:58 AM

## 2020-03-14 NOTE — Progress Notes (Signed)
NAME:  Kimberly Howell, MRN:  [id number], DOB:  Jan 22, 1946, LOS: 28 ADMISSION DATE:  03/03/2020, CONSULTATION DATE:  03/14/20 REFERRING MD:  EDP, CHIEF COMPLAINT:  Cardiac arrest   Brief History   74 year old female with past medical history of chronic respiratory failure with COPD on 3 L home O2 and CPAP nightly, stage IV CKD, hypertension, morbid obesity who had a cardiac arrest with >45 minutes CPR before ROSC then lost pulses three more times.  Intubated and PCCM consulted for admission  Past Medical History  Morbid obesity with BMI of 50.0-59.9, adult ([place])   Chronic respiratory failure (HCC)   CKD (chronic kidney disease) stage 3, GFR 30-59 ml/min ([place])   Essential hypertension   OSA on CPAP  Significant Hospital Events   9/18 Admit to PCCM  Consults:  Ophthamology ENT Neurology Palliative  Procedures:  9/18 ETT >> 9/28 9/21 CVC 3L >> to be taken out 9/29 post PEG 9/27 LP  9/28 Tracheostomy  9/29 IR PEG   Significant Diagnostic Tests:  CT head/C-spine/Maxillofacial>>Small left frontal scalp hematoma without skull fracture. Fracture of the left lamina papyracea with herniation of the left medial rectus muscle into the ethmoid sinus. Small amount of retro bulbar stranding, likely hemorrhage.  Right lung apex consolidation concerning for pneumonia. TTE 9/18 >>LVEF 60-65%; no regional wall abnormalities; G1DD, normal RV EEG 9/20 > diffuse encephalopathy MRI brain 9/24 >>Probable subtle diffuse diffusion-weighted signal abnormality throughout the cerebellum. Subtle symmetric diffusion-weighted signal abnormality is also questioned within the medial temporal lobes/hippocampi. These findings are highly suspicious for hypoxic/ischemic injury. Small foci of apparent diffusion weighted hyperintensity within the paramedian frontoparietal lobes appear to predominant reflect susceptibility artifact from adjacent dural calcification. However, two punctate acute infarcts within the  paramedian right frontal lobe are difficult to exclude. Medially displaced acute fracture of the left lamina papyracea. There is also an acute, displaced fracture of the left fovea ethmoidalis. Orbital fat and the medial rectus muscle extend into the left ethmoid sinus. Additionally, orbital fat extends from the left ethmoid sinus through the fracture defect in the fovea ethmoidalis intracranially into the left anterior cranial fossa. The intracranial component of orbital fat measures 1 cm and exerts mild local mass effect upon the anteroinferior left frontal lobe. Mild cerebral white matter chronic small vessel ischemic disease.  Micro Data:  9/18 Sars-CoV-2>>negative 9/18 MRSA >> positive 9/27 CSF culture >> negative 9/27 UC >> negative 9/27 BC x 2 >> negative 9/28 BAL >>   Antimicrobials:   9/19 Unasyn >> 9/25 9/27 Vancomycin >> 9/27 (1 dose) 9/27 Ceftriaxone >> 9/29  Interim history/subjective:  Patient sitting up with bed in chair position, Alert/tracking, shaking head no to pain- but intermittently following commands.  Overnight no issues TMAX: 38.1 Trach in place with ventilator support NPO for IR PEG placement 9/29  Objective   Blood pressure (!) 128/57, pulse 85, temperature 99.7 F (37.6 C), resp. rate (!) 25, height _0  (1.727 m), weight 130.3 kg, SpO2 99 %.    Vent Mode: PRVC FiO2 (%):  [40 %-50 %] 40 % Set Rate:  [22 bmp] 22 bmp Vt Set:  [380 mL] 380 mL PEEP:  [5 cmH20] 5 cmH20 Plateau Pressure:  [19 cmH20-21 cmH20] 21 cmH20   Intake/Output Summary (Last 24 hours) at 03/14/2020 0917 Last data filed at 03/14/2020 0700 Gross per 24 hour  Intake 239.88 ml  Output 2205 ml  Net -1965.12 ml   Filed Weights   03/12/20 0500 03/13/20 0500 03/14/20 0500  Weight:  133.7 kg 132.9 kg 130.3 kg   EXAM General: Adult female, sitting with bed in chair position, NAD HEENT: MM pink/moist, anicteric, PERRL +3 Neuro: Alert & tracking, challenging to assess- not mouthing  words/nonverbal with trach in place, sticks out tongue midline to command CV: s1s2, RRR, NSR on monitor, no murmurs/rubs/gallops, pulses +2 R/P Pulm: regular, non labored, trach in place with vent support- 40%, diminished breath sounds GI: soft, obese, bs x 4 Skin: scattered ecchymosis, healing abrasion on forehead, improving MASD Extremities: warm/dry, no edema noted  Labs/Imaging personally reviewed I&O's: Net - 2 L AM labs pending CXR 9/28 post tracheostomy shows extensive soft tissue air in the neck & supraclavicular regions- likely post operative etiology.  LLL consolidation, small pleural effusion bilaterally with bibasilar atelectasis.  Assessment & Plan:   Cardiac arrest with hypoxic and hypercarbic respiratory failure Patient with prolonged downtime.  Greater than 45 minutes. Presumed PEA cardiac arrest.  Had 4 separate events with ROSC obtained. EKG without signs of ischemia.  Patient has been unsteady on her feet recently, family states she was taking too much propranolol occasionally accidentally, this may have been proximal issue.  Having some slow neurological recovery, discussed with family at length 9/27 and they would like trial of LTACH.  - Tracheostomy placed 9/28, consider SBT post PEG placement as tolerated - Plan for PEG placement today 9/29 - future LTACH placement per family discussion 9/27 - daily BMET, replace electrolytes PRN - continue cardiac monitoring  Periorbital fracture with hyphema  Herniation of the left medial rectus muscle into the sinus with small amount of retro bulbar stranding. ENT, ophthalmology, neurosurgery consulted in case repair is needed prior to [facility] placement. - ENT bedside 9/28: waiting for Opthal eval of EOM, vision and entrapment.  If no entrapment recommendation to not repair orbital injury. Patient is at risk for enopthalmus later, but ENT considers the risk of surgery outweighs the benefit. - Ophthalmologist bedside 9/28: EOM intact  and no limitations of gaze & no signs of entrapment seen, with the rest of exam WNL.  Recommend f/u outpatient. - Neurosurgery consulted 9/28: no indication for repair unless clear CSF leak which the patient has no symptoms of.  - Ceftriaxone discontinued with negative LP & CSF culture - Neuro assessments Q 4 while in ICU - monitor for signs of CSF leak - follow up with Ophthalmology outpatient  R sided Pneumonia Probable aspiration during cardiac event.  No recent symptoms per son, no leukocytosis or fever.  S/p unasyn x 7 days.  Recurrent fevers, started ceftriaxone 9/27 (for meningitis coverage + empiric aspiration), f/u BAL and culture data but suspect noninfectious.  - BAL pending, BC/US negative - Monitor fever/WBC curve: TMAX- 38.1 / WBC- pending for this AM - Encourage pulmonary hygiene  Type 2 diabetes - SSI with CBGs - Levemir 12 units BID  AKI on CKD- resolved, now close to euvolemic and new baseline Cr  HFpEF- Echo Grade 1 diastolic dysfunction Hypertension  - Home medications: amlodipine, carvedilol, furosemide, lisinopril on hold due to pressor requirement this admission.  As patient stabilizes consider restarting home medications as needed. - Strict I/O - Daily BMET  Constipation- Improved  - Miralax & Colace PRN   Best practice:  Diet: NPO with TFs ( on hold for PEG placement 9/29) Pain/Anxiety/Delirium protocol (if indicated): Fentanyl IVP PRN VAP protocol (if indicated): in place: HOB 30 degrees, suction as needed DVT prophylaxis: Heparin SQ/ SCDs GI prophylaxis: Protonix Glucose control: SSI & levemir BID Mobility: Bedrest,  mobilize as tolerated Code Status: DNR Family Communication: 9/29- Son updated via phone, questions answered Disposition: ICU  Critical care time: 30 minutes     Domingo Pulse [place], AGACNP-BC [facility]    Please see Amion for pager details.

## 2020-03-14 NOTE — Sedation Documentation (Signed)
Pt arrived from [facility] on ventilator with RN and RT. Pt vitals are stable. Pt is awake and able to nod/shake head to answer yes and no. Assisted with transferring pt safely to procedure table. Will continue to monitor.

## 2020-03-14 NOTE — Sedation Documentation (Signed)
Vital signs stable. Procedure started 

## 2020-03-14 NOTE — TOC Initial Note (Signed)
Transition of Care [facility]) - Initial/Assessment Note    Patient Details  Name: Kimberly Howell MRN: [id number] Date of Birth: [birth year]  Transition of Care [facility]) CM/SW Contact:    Curlene Labrum, RN Phone Number: 03/14/2020, 12:00 PM  Clinical Narrative:                 Case management called and spoke with the patient's son, Merlene Dante, on the phone and gave Medicare choice regarding LTAC placement.  The patient's son stated that he [facility] but was open to admission at either facility based on bed availability.  The patient lived at home prior to her admission to 2 H but was having difficulty ambulating with RW and was on chronic home O2 at 4-5 Liters/min prior to hospitalization.  She was also wearing CPAP at night but needed a new sleep study for CPAP issues at home.  The patient is fully vaccinated with J&J COVID vaccine from April 2020.  The patient is due to go to the OR today for PEG but currently has trach to vent - placed 03/13/2020.  I called and left a message with Anderson [facility] with [facility] regarding possible LTAC placement for this patient.  Waiting for return phone call and bed availability at the facility.  Expected Discharge Plan: Long Term Acute Care (LTAC) Barriers to Discharge: Continued Medical Work up   Patient Goals and CMS Choice Patient states their goals for this hospitalization and ongoing recovery are:: Patient's family is agreeable to LTAC placement for 1. [facility] and 2. [facility] [employer] Compare Post Acute Care list provided to:: Patient Represenative (must comment) [name] [facility] [phone]) Choice offered to / list presented to : Adult Children  Expected Discharge Plan and Services Expected Discharge Plan: Long Term Acute Care (LTAC)   Discharge Planning Services: CM Consult Post Acute Care Choice: Long Term Acute Care (LTAC) Living arrangements for the past  2 months: Apartment                                      Prior Living Arrangements/Services Living arrangements for the past 2 months: Apartment   Patient language and need for interpreter reviewed:: Yes Do you feel safe going back to the place where you live?: No      Need for Family Participation in Patient Care: Yes (Comment) Care giver support system in place?: Yes (comment) Current home services: DME (RW, CPAP and chronic Home O2 at 4-5 L.min prior to admission to [place] at [facility]) Criminal Activity/Legal Involvement Pertinent to Current Situation/Hospitalization: No - Comment as needed  Activities of Daily Living Home Assistive Devices/Equipment: None ADL Screening (condition at time of admission) Patient's cognitive ability adequate to safely complete daily activities?: Yes Is the patient deaf or have difficulty hearing?: No Does the patient have difficulty seeing, even when wearing glasses/contacts?: No Does the patient have difficulty concentrating, remembering, or making decisions?: No Patient able to express need for assistance with ADLs?: No Does the patient have difficulty dressing or bathing?: No Independently performs ADLs?: Yes (appropriate for developmental age) Does the patient have difficulty walking or climbing stairs?: Yes Weakness of Legs: Both Weakness of Arms/Hands: None  Permission Sought/Granted Permission sought to share information with : Case Manager Permission granted to share information with : Yes, Verbal Permission Granted     Permission granted  to share info w AGENCY: [place] granted to share info w Relationship: son - Gracilyn Gunia     Emotional Assessment Appearance:: Appears stated age Attitude/Demeanor/Rapport: Engaged (nods head - unable to verbalize due to trach to vent at this time.)   Orientation: : Oriented to Self Alcohol / Substance Use: Not Applicable Psych Involvement: No  (comment)  Admission diagnosis:  Acute respiratory failure ([place]) [J96.00] Cardiopulmonary arrest ([place]) [I46.9] Trauma [T14.90XA] Patient Active Problem List   Diagnosis Date Noted  . Dysphagia   . Status post tracheostomy ([place])   . Palliative care encounter   . History of ETT   . Trauma   . Septic shock ([place])   . Encephalopathy acute   . Acute respiratory failure ([place]) 03/03/2020  . Cardiopulmonary arrest [facility])    PCP:  Seward Carol, MD Pharmacy:   [id number] - [place], [place] [address] [place] [address] Phone: [phone] Fax: [phone]     Social Determinants of Health (SDOH) Interventions    Readmission Risk Interventions Readmission Risk Prevention Plan 03/14/2020  Transportation Screening Complete  PCP or Specialist Appt within 5-7 Days Complete  Home Care Screening Complete  Medication Review (RN CM) Complete

## 2020-03-14 NOTE — TOC Progression Note (Signed)
Transition of Care [facility]) - Progression Note    Patient Details  Name: DESHANTA LADY MRN: [id number] Date of Birth: 11-Apr-1946  Transition of Care [facility]) CM/SW Contact  Curlene Labrum, RN Phone Number: 03/14/2020, 3:39 PM  Clinical Narrative:    Case management spoke with the patient's son this morning and offer choice regarding LTAC placement and the patient's son [facility] and [facility].  I called and spoke with [place] and she stated that she was unable to offer the patient a bed at this point.  I called and spoke with Raquel Sarna [facility] and she will follow the patient for possible admission given the patient's rehab, medical needs and opportunity for admission considering the patient insurance provider.  [facility] facility offers multiple levels of care that may meet the patient's needs for ventilatory support, airway management, rehabilitation needs and medical management.  Will continue to follow for transitions of care needs for rehab placement when able.   Expected Discharge Plan: Long Term Acute Care (LTAC) Barriers to Discharge: Continued Medical Work up  Expected Discharge Plan and Services Expected Discharge Plan: Long Term Acute Care (LTAC)   Discharge Planning Services: CM Consult Post Acute Care Choice: Long Term Acute Care (LTAC) Living arrangements for the past 2 months: Apartment                    Social Determinants of Health (SDOH) Interventions    Readmission Risk Interventions Readmission Risk Prevention Plan 03/14/2020  Transportation Screening Complete  PCP or Specialist Appt within 5-7 Days Complete  Home Care Screening Complete  Medication Review (RN CM) Complete

## 2020-03-14 NOTE — Evaluation (Signed)
Physical Therapy Evaluation Patient Details Name: Kimberly Howell MRN: [id number] DOB: 08-Feb-1946 Today's Date: 03/14/2020   History of Present Illness  74 yo admitted with cardiac arrest and fall on 9/18 s/p CPR with resultant left orbit fx. Intubated 9/18, trach 9/ 28. PMhx: morbid obesity, HTN, respiratory failure on 3L, CKD stage 4  Clinical Impression  Pt awake with trach on vent. Pt nodding at times but inconsistent responses with pt demonstrating lack of awareness to orientation but relaying that she was walking and performing ADLs PTA. Pt with extensive bil UE and LE weakness with very limited ability to extend knees and wiggle toes and perform shoulder shrugs. Pt with decreased cardiopulmonary function, strength, transfers and balance who will benefit from acute therapy to maximize mobility, function and safety to decrease burden of care.   FiO2 40% on PRVC With SpO2 96% HR 86 149/77 (97)    Follow Up Recommendations LTACH    Equipment Recommendations  Hospital bed    Recommendations for Other Services       Precautions / Restrictions Precautions Precautions: Fall;Other (comment) Precaution Comments: trach, vent, PEG planned 9/29 Restrictions Weight Bearing Restrictions: No      Mobility  Bed Mobility Overal bed mobility: Needs Assistance Bed Mobility: Supine to Sit           General bed mobility comments: pt positioned into chair position via bed. Pt unable to assist with pulling trunk off surface with max +2 assist and unable to significantly move arms or legs to assist with mobility  Transfers                    Ambulation/Gait                Stairs            Wheelchair Mobility    Modified Rankin (Stroke Patients Only)       Balance Overall balance assessment: Needs assistance   Sitting balance-Leahy Scale: Zero Sitting balance - Comments: left lean in sitting                                      Pertinent Vitals/Pain Pain Assessment: Faces Pain Score: 4  Faces Pain Scale: Hurts little more Pain Location: bil shoulder motion grimace Pain Descriptors / Indicators: Grimacing Pain Intervention(s): Limited activity within patient's tolerance;Monitored during session;Repositioned    Home Living Family/patient expects to be discharged to:: Private residence Living Arrangements: Children Available Help at Discharge: Family                  Prior Function Level of Independence: Independent         Comments: pt nodding head in response to being able bath, dress and walk on her own with assist for homemaking. Pt unable to further detail home setup     Hand Dominance        Extremity/Trunk Assessment   Upper Extremity Assessment Upper Extremity Assessment: Defer to OT evaluation    Lower Extremity Assessment Lower Extremity Assessment: RLE deficits/detail;LLE deficits/detail RLE Deficits / Details: pt able to wiggle toes, no active dorsiflexion/plantarflexion. 2-/5 knee extension, no active hip or knee flexion LLE Deficits / Details: pt able to wiggle toes, 2-/5 ankle dorsiflexion/plantarflexion. 2-/5 knee extension, no active hip or knee flexion    Cervical / Trunk Assessment Cervical / Trunk Assessment: Kyphotic (pt unable to assist with pulling trunk  forward off elevated HOB)  Communication   Communication: Tracheostomy (attempting to mouth words but not intelligible at this time)  Cognition Arousal/Alertness: Awake/alert Behavior During Therapy: Flat affect Overall Cognitive Status: Impaired/Different from baseline Area of Impairment: Following commands;Safety/judgement;Attention                   Current Attention Level: Focused   Following Commands: Follows one step commands inconsistently;Follows one step commands with increased time       General Comments: pt with delayed response to questions and commands, decreased attention to right.  Pt unable to accurately answer location when given choices      General Comments      Exercises General Exercises - Lower Extremity Short Arc QuadSinclair Ship;Both;5 reps;Seated Heel Slides: PROM;Both;Seated (3 reps)   Assessment/Plan    PT Assessment Patient needs continued PT services  PT Problem List Decreased strength;Decreased mobility;Decreased safety awareness;Decreased range of motion;Decreased coordination;Obesity;Decreased activity tolerance;Decreased cognition;Cardiopulmonary status limiting activity;Decreased balance;Decreased knowledge of use of DME       PT Treatment Interventions Functional mobility training;Therapeutic activities;Patient/family education;Cognitive remediation;Neuromuscular re-education;Balance training;DME instruction;Therapeutic exercise    PT Goals (Current goals can be found in the Care Plan section)  Acute Rehab PT Goals PT Goal Formulation: Patient unable to participate in goal setting Time For Goal Achievement: 03/28/20 Potential to Achieve Goals: Fair    Frequency Min 2X/week   Barriers to discharge        Co-evaluation PT/OT/SLP Co-Evaluation/Treatment: Yes Reason for Co-Treatment: Complexity of the patient's impairments (multi-system involvement);For patient/therapist safety PT goals addressed during session: Mobility/safety with mobility;Strengthening/ROM         AM-PAC PT "6 Clicks" Mobility  Outcome Measure Help needed turning from your back to your side while in a flat bed without using bedrails?: Total Help needed moving from lying on your back to sitting on the side of a flat bed without using bedrails?: Total Help needed moving to and from a bed to a chair (including a wheelchair)?: Total Help needed standing up from a chair using your arms (e.g., wheelchair or bedside chair)?: Total Help needed to walk in hospital room?: Total Help needed climbing 3-5 steps with a railing? : Total 6 Click Score: 6    End of Session    Activity Tolerance: Patient tolerated treatment well Patient left: in bed;with call bell/phone within reach;with bed alarm set;with nursing/sitter in room Nurse Communication: Mobility status;Need for lift equipment PT Visit Diagnosis: Other abnormalities of gait and mobility (R26.89);Muscle weakness (generalized) (M62.81);Other symptoms and signs involving the nervous system (R29.898)    Time: 2297-9892 PT Time Calculation (min) (ACUTE ONLY): 22 min   Charges:   PT Evaluation $PT Eval High Complexity: 1 High          Gevorg Brum P, PT Acute Rehabilitation Services Pager: [phone] Office: [phone]   Chrisanne Loose B Laelyn Blumenthal 03/14/2020, 11:10 AM

## 2020-03-14 NOTE — Sedation Documentation (Signed)
Totals 14 mins 2gm ancef 57mcg fentanyl 2mg  versed.  Assisted pt back to bed for transport back to [place]. RT in room for assistance with move, vitals remain stable.

## 2020-03-15 ENCOUNTER — Inpatient Hospital Stay (HOSPITAL_COMMUNITY): Payer: Medicare HMO

## 2020-03-15 DIAGNOSIS — I469 Cardiac arrest, cause unspecified: Secondary | ICD-10-CM | POA: Diagnosis not present

## 2020-03-15 LAB — GLUCOSE, CAPILLARY
Glucose-Capillary: 146 mg/dL — ABNORMAL HIGH (ref 70–99)
Glucose-Capillary: 157 mg/dL — ABNORMAL HIGH (ref 70–99)
Glucose-Capillary: 71 mg/dL (ref 70–99)
Glucose-Capillary: 75 mg/dL (ref 70–99)
Glucose-Capillary: 77 mg/dL (ref 70–99)
Glucose-Capillary: 78 mg/dL (ref 70–99)
Glucose-Capillary: 91 mg/dL (ref 70–99)
Glucose-Capillary: 98 mg/dL (ref 70–99)

## 2020-03-15 LAB — CBC
HCT: 25.9 % — ABNORMAL LOW (ref 36.0–46.0)
Hemoglobin: 7.7 g/dL — ABNORMAL LOW (ref 12.0–15.0)
MCH: 29.8 pg (ref 26.0–34.0)
MCHC: 29.7 g/dL — ABNORMAL LOW (ref 30.0–36.0)
MCV: 100.4 fL — ABNORMAL HIGH (ref 80.0–100.0)
Platelets: 244 10*3/uL (ref 150–400)
RBC: 2.58 MIL/uL — ABNORMAL LOW (ref 3.87–5.11)
RDW: 15.6 % — ABNORMAL HIGH (ref 11.5–15.5)
WBC: 10.3 10*3/uL (ref 4.0–10.5)
nRBC: 0 % (ref 0.0–0.2)

## 2020-03-15 LAB — BASIC METABOLIC PANEL
Anion gap: 18 — ABNORMAL HIGH (ref 5–15)
BUN: 72 mg/dL — ABNORMAL HIGH (ref 8–23)
CO2: 25 mmol/L (ref 22–32)
Calcium: 9.1 mg/dL (ref 8.9–10.3)
Chloride: 108 mmol/L (ref 98–111)
Creatinine, Ser: 2.71 mg/dL — ABNORMAL HIGH (ref 0.44–1.00)
GFR calc Af Amer: 20 mL/min — ABNORMAL LOW (ref >60)
GFR calc non Af Amer: 17 mL/min — ABNORMAL LOW (ref >60)
Glucose, Bld: 87 mg/dL (ref 70–99)
Potassium: 4 mmol/L (ref 3.5–5.1)
Sodium: 151 mmol/L — ABNORMAL HIGH (ref 135–145)

## 2020-03-15 LAB — MAGNESIUM: Magnesium: 2.5 mg/dL — ABNORMAL HIGH (ref 1.7–2.4)

## 2020-03-15 LAB — CULTURE, RESPIRATORY W GRAM STAIN: Culture: NO GROWTH

## 2020-03-15 LAB — PHOSPHORUS: Phosphorus: 4.7 mg/dL — ABNORMAL HIGH (ref 2.5–4.6)

## 2020-03-15 MED ORDER — ACETAMINOPHEN 650 MG RE SUPP
RECTAL | Status: AC
  Administered 2020-03-15: 650 mg
  Filled 2020-03-15: qty 1

## 2020-03-15 NOTE — Progress Notes (Signed)
Referring Physician(s): Dr. Tamala Julian   Supervising Physician: Corrie Mckusick  Patient Status:  [facility] - In-pt  Chief Complaint: Cardiac arrest with hypoxic and hypercarbic respiratory failure; s/p tracheostomy. Gastrostomy tube placed 03/14/20  Subjective: Patient in bed, eyes open. No apparent discomfort or distress. She makes intermittent eye contact. Her sister is at the bedside.   Allergies: Prednisone  Medications: Prior to Admission medications   Medication Sig Start Date End Date Taking? Authorizing Provider  acetaminophen (TYLENOL) 325 MG tablet Take 650 mg by mouth every 6 (six) hours as needed for moderate pain.   Yes [provider]  albuterol (VENTOLIN HFA) 108 (90 Base) MCG/ACT inhaler Inhale 2 puffs into the lungs every 6 (six) hours as needed for wheezing or shortness of breath.   Yes [provider]  amLODipine (NORVASC) 2.5 MG tablet Take 2.5 mg by mouth daily.   Yes [provider]  aspirin 81 MG EC tablet Take 81 mg by mouth daily. Swallow whole.   Yes [provider]  carvedilol (COREG) 25 MG tablet Take 25 mg by mouth 2 (two) times daily with a meal.   Yes [provider]  cetirizine (ZYRTEC) 10 MG tablet Take 10 mg by mouth daily.   Yes [provider]  colchicine 0.6 MG tablet Take 0.6 mg by mouth daily.   Yes [provider]  diclofenac Sodium (VOLTAREN) 1 % GEL Apply 2 g topically 4 (four) times daily.   Yes [provider]  esomeprazole (NEXIUM) 40 MG capsule Take 40 mg by mouth daily at 12 noon.   Yes [provider]  febuxostat (ULORIC) 40 MG tablet Take 80 mg by mouth daily.    Yes [provider]  ferrous sulfate 325 (65 FE) MG tablet Take 325 mg by mouth 2 (two) times daily with a meal.   Yes [provider]  furosemide (LASIX) 20 MG tablet Take 20 mg by mouth.   Yes [provider]  glipiZIDE (GLUCOTROL XL) 2.5 MG 24 hr tablet Take 2.5 mg by mouth daily  with breakfast.   Yes [provider]  HYDROcodone-acetaminophen (NORCO) 10-325 MG tablet Take 1 tablet by mouth every 6 (six) hours as needed.   Yes [provider]  linagliptin (TRADJENTA) 5 MG TABS tablet Take 5 mg by mouth daily.   Yes [provider]  lisinopril (ZESTRIL) 40 MG tablet Take 40 mg by mouth daily.   Yes [provider]  meclizine (ANTIVERT) 25 MG tablet Take 25 mg by mouth daily.   Yes [provider]  methocarbamol (ROBAXIN) 750 MG tablet Take 750 mg by mouth daily.    Yes [provider]  Multiple Vitamins-Minerals (CENTRAVITES 50 PLUS) TABS Take 1 tablet by mouth daily.   Yes [provider]  pramipexole (MIRAPEX) 0.125 MG tablet Take 0.125 mg by mouth 3 (three) times daily.   Yes [provider]  pravastatin (PRAVACHOL) 40 MG tablet Take 40 mg by mouth daily.   Yes [provider]  saxagliptin HCl (ONGLYZA) 2.5 MG TABS tablet Take 2.5 mg by mouth daily.   Yes [provider]     Vital Signs: BP 136/84   Pulse 80   Temp 99.6 F (37.6 C) (Oral)   Resp 14   Ht 5\' 8"  (1.727 m)   Wt 285 lb 0.9 oz (129.3 kg)   SpO2 97%   BMI 43.34 kg/m   Physical Exam Constitutional:      General: She  is not in acute distress.    Appearance: She is obese.  Cardiovascular:     Rate and Rhythm: Normal rate and regular rhythm.  Pulmonary:     Comments: Tracheostomy/ventilator Abdominal:     Palpations: Abdomen is soft.     Comments: Gastrostomy tube in place. Dressing is clean and dry. Site has mild erythema and tenderness.   Neurological:     Mental Status: She is alert.     Comments: Unable to assess. The patient made intermittent eye contact and it seemed as if she was trying to say something.      Imaging: CT ABDOMEN WO CONTRAST  Addendum Date: 03/13/2020   ADDENDUM REPORT: 03/13/2020 20:11 ADDENDUM: Not mentioned above is a chronic partially calcified left upper pelvic 2.1 cm  lesion on 66/3, likely an area of fat necrosis related to remote omental infarct. Electronically Signed   By: Abigail Miyamoto M.D.   On: 03/13/2020 20:11   Result Date: 03/13/2020 CLINICAL DATA:  Evaluate anatomy prior to G-tube placement. EXAM: CT ABDOMEN WITHOUT CONTRAST TECHNIQUE: Multidetector CT imaging of the abdomen was performed following the standard protocol without IV contrast. COMPARISON:  Plain film 03/09/2020.  08/11/2018 CT. FINDINGS: Lower chest: Limitations secondary to patient body habitus. Dependent right greater than left base airspace disease. Moderate cardiomegaly. Subcutaneous presternal edema and nodularity, including on 01/03. Hepatobiliary: Normal noncontrast appearance of the liver. Multiple dependent gallstones. No acute cholecystitis or biliary duct dilatation. Pancreas: Normal, without mass or ductal dilatation. Spleen: Normal in size, without focal abnormality. Adrenals/Urinary Tract: Normal adrenal glands. Mild renal cortical thinning bilaterally. No renal calculi or hydronephrosis. Stomach/Bowel: The proximal and mid stomach are underdistended. Apparent borderline wall thickening is likely secondary. The gastric body is positioned just deep to the abdominal wall, without intervening bowel. Extensive colonic diverticulosis. Normal terminal ileum. 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 Example 71/3 today. Normal small bowel. Vascular/Lymphatic: Aortic atherosclerosis. Other: No abdominal ascites or free intraperitoneal air. Incompletely imaged prominent for age uterus. Diffuse anasarca. Musculoskeletal: Advanced lumbosacral spondylosis. IMPRESSION: 1. Limitations secondary to patient body habitus. 2.  No acute process in the abdomen or pelvis. 3. Cholelithiasis. 4. Bibasilar airspace disease, atelectasis versus infection or aspiration. 5. Anasarca with nonspecific presternal subcutaneous interstitial  thickening and nodularity. Consider physical exam correlation. 6. Soft tissue density at the site of previously question mucocele of the appendix. Incompletely imaged on this abdominal exam. Please compared to 08/11/2018 abdominopelvic CT and interval surgical history. 7. Incompletely imaged prominent for age uterus, nonspecific. Electronically Signed: By: Abigail Miyamoto M.D. On: 03/13/2020 19:50   IR GASTROSTOMY TUBE MOD SED  Result Date: 03/15/2020 INDICATION: 74 year old female with recent cardiac arrest resulting in intubation and ICU stay now with tracheostomy requiring percutaneous enteral access for long-term nutrition. EXAM: PULL TROUGH GASTROSTOMY TUBE PLACEMENT COMPARISON:  CT abdomen pelvis from 03/13/2020 MEDICATIONS: Ancef 2 gm IV; Antibiotics were administered within 1 hour of the procedure. Glucagon 1 mg IV CONTRAST:  Fifteen mL of Omnipaque 300 administered into the gastric lumen. ANESTHESIA/SEDATION: Moderate (conscious) sedation was employed during this procedure. A total of Versed 2 mg and Fentanyl 50 mcg was administered intravenously. Moderate Sedation Time: 14 minutes. The patient's level of consciousness and vital signs were monitored continuously by radiology nursing throughout the procedure under my direct supervision. FLUOROSCOPY TIME:  One minutes 54 seconds (32 mGy) COMPLICATIONS: None immediate. PROCEDURE: Informed written consent was obtained from  the patient's son following explanation of the procedure, risks, benefits and alternatives. A time out was performed prior to the initiation of the procedure. Maximal barrier sterile technique utilized including caps, mask, sterile gowns, sterile gloves, large sterile drape, hand hygiene and Betadine prep. The left upper quadrant was sterilely prepped and draped. An oral gastric catheter was inserted into the stomach under fluoroscopy. The existing nasogastric feeding tube was removed. The left costal margin and air opacified transverse  colon were identified and avoided. Air was injected into the stomach for insufflation and visualization under fluoroscopy. Under sterile conditions a 17 gauge trocar needle was utilized to access the stomach percutaneously beneath the left subcostal margin after the overlying soft tissues were anesthetized with 1% Lidocaine with epinephrine. Needle position was confirmed within the stomach with aspiration of air and injection of small amount of contrast. A single T tack was deployed for gastropexy. Over an Amplatz guide wire, a 9-French sheath was inserted into the stomach. A snare device was utilized to capture the oral gastric catheter. The snare device was pulled retrograde from the stomach up the esophagus and out the oropharynx. The 20-French pull-through gastrostomy was connected to the snare device and pulled antegrade through the oropharynx down the esophagus into the stomach and then through the percutaneous tract external to the patient. The gastrostomy was assembled externally. Contrast injection confirms position in the stomach. Several spot radiographic images were obtained in various obliquities for documentation. The patient tolerated procedure well without immediate post procedural complication. FINDINGS: After successful fluoroscopic guided placement, the gastrostomy tube is appropriately positioned with internal disc against the ventral aspect of the gastric lumen. IMPRESSION: Successful fluoroscopic insertion of a 20-French pull-through gastrostomy tube. The gastrostomy may be used immediately for medication administration and in 24 hrs for the initiation of feeds. Ruthann Cancer, MD [facility] Radiology Electronically Signed   By: Ruthann Cancer MD   On: 03/15/2020 08:12   DG CHEST PORT 1 VIEW  Result Date: 03/15/2020 CLINICAL DATA:  Tracheostomy.  Respiratory failure. EXAM: PORTABLE CHEST 1 VIEW COMPARISON:  03/13/2020. FINDINGS: Interval  removal of left subclavian line. Tracheostomy tube in stable position. Interval resolution of subcutaneous emphysema in the neck. Cardiomegaly. Diffuse bilateral pulmonary infiltrates/edema. Small bilateral pleural effusions cannot be excluded. No pneumothorax. IMPRESSION: 1. Interval removal of left subclavian line. Tracheostomy tube in stable position. Interval resolution of subcutaneous emphysema in the neck. 2. Cardiomegaly. Diffuse bilateral pulmonary infiltrates/edema. Small bilateral pleural effusions cannot be excluded. CHF could present in this fashion. Electronically Signed   By: Marcello Moores  Register   On: 03/15/2020 07:25   DG Chest Port 1 View  Result Date: 03/13/2020 CLINICAL DATA:  Tracheostomy placement EXAM: PORTABLE CHEST 1 VIEW COMPARISON:  March 08, 2020 FINDINGS: Tracheostomy now present with tip 3.7 cm above the carina. Extensive subcutaneous air in the neck and supraclavicular soft tissues is likely due to recent tracheostomy placement. Central catheter tip is in the left innominate vein. No pneumothorax appreciable. There is consolidation in the left lower lobe with small left pleural effusion. There is atelectatic change in the lung bases as well. There is an equivocal right pleural effusion. Heart is enlarged with pulmonary vascularity normal. No adenopathy. There is aortic atherosclerosis. There is extensive arthropathy in the shoulders. IMPRESSION: Tracheostomy now present. Extensive soft tissue air in the neck and supraclavicular regions is likely of postoperative etiology. No pneumothorax evident. Left lower lobe airspace consolidation which in part is likely due to atelectatic  change. A degree of underlying pneumonia in the left base cannot be excluded. Small pleural effusion bilaterally with bibasilar atelectasis. Stable cardiomegaly. Aortic Atherosclerosis (ICD10-I70.0). Electronically Signed   By: Lowella Grip III M.D.   On: 03/13/2020 09:42    Labs:  CBC: Recent Labs      03/12/20 0456 03/13/20 0417 03/14/20 1027 03/15/20 0228  WBC 10.5 13.5* 10.5 10.3  HGB 7.4* 7.7* 7.8* 7.7*  HCT 25.2* 25.6* 26.5* 25.9*  PLT 236 267 282 244    COAGS: Recent Labs    03/03/20 0524 03/03/20 0821 03/03/20 1917 03/13/20 0417  INR 1.2 1.1 1.1 1.2  APTT  --  32  --   --     BMP: Recent Labs    03/12/20 0456 03/13/20 0417 03/14/20 1027 03/15/20 0228  NA 147* 146* 150* 151*  K 3.5 3.9 4.0 4.0  CL 100 102 107 108  CO2 33* 29 28 25   GLUCOSE 132* 100* 93 87  BUN 80* 80* 72* 72*  CALCIUM 8.7* 8.5* 9.0 9.1  CREATININE 3.12* 2.86* 2.71* 2.71*  GFRNONAA 14* 16* 17* 17*  GFRAA 17* 18* 20* 20*    LIVER FUNCTION TESTS: Recent Labs    03/03/20 0524 03/06/20 0752  BILITOT 0.9 0.8  AST 292* 90*  ALT 247* 118*  ALKPHOS 92 80  PROT 5.9* 6.2*  ALBUMIN 2.5* 2.3*    Assessment and Plan:  Cardiac arrest with hypoxic and hypercarbic respiratory failure; s/p tracheostomy. Gastrostomy tube placed 03/14/20. Gastrostomy tube site is clean and dry, minimal erythema and tenderness around the site. Tube is ready for use.  IR will sign of but available if needed.  Electronically Signed: Soyla Dryer, AGACNP-BC [phone] 03/15/2020, 12:13 PM   I spent a total of 15 Minutes at the the patient's bedside AND on the patient's hospital floor or unit, greater than 50% of which was counseling/coordinating care for gastrostomy tube follow up.

## 2020-03-15 NOTE — TOC Initial Note (Signed)
Transition of Care [facility]) - Initial/Assessment Note    Patient Details  Name: Kimberly Howell MRN: [id number] Date of Birth: Apr 17, 1946  [facility]) CM/SW Contact:    Trula Ore, [place] Phone Number: 03/15/2020, 3:20 PM  Clinical Narrative:                  CSW spoke with patients son Kimberly Howell to let him know that LTACH was denied for placement for patient.  CSW received consult for possible SNF placement at time of discharge. CSW spoke with patients son Kimberly Howell regarding PT recommendation of SNF placement at time of discharge. CSW explained to patients son that LTACH has been denied and the need to explore another level of care for patient. Patients son expressed understanding of PT recommendation and is agreeable to SNF placement at time of discharge. Patients son reports preference for [facility], but is agreeable to [place] faxing out to [facility] as back up options . Patient has received the COVID vaccines.  CSW to continue to follow and assist with discharge planning needs.  Expected Discharge Plan: Skilled Nursing Facility Barriers to Discharge: Continued Medical Work up   Patient Goals and CMS Choice Patient states their goals for this hospitalization and ongoing recovery are:: to go to SNF CMS Medicare.gov Compare Post Acute Care list provided to:: Patient Represenative (must comment) Kimberly Howell) Choice offered to / list presented to : Adult Children Kimberly Howell)  Expected Discharge Plan and Services Expected Discharge Plan: [place]   Discharge Planning Services: CM Consult Post Acute Care Choice: Long Term Acute Care (LTAC) Living arrangements for the past 2 months: Single Family Home                                      Prior Living Arrangements/Services Living arrangements for the past 2 months: Single Family Home Lives with:: Self, Adult Children Kimberly Howell Son) Patient language and need for interpreter reviewed:: Yes Do you  feel safe going back to the place where you live?: No   SNF  Need for Family Participation in Patient Care: Yes (Comment) Care giver support system in place?: Yes (comment) Current home services: DME (RW, CPAP and chronic Home O2 at 4-5 L.min prior to admission to [place] at [facility]) Criminal Activity/Legal Involvement Pertinent to Current Situation/Hospitalization: No - Comment as needed  Activities of Daily Living Home Assistive Devices/Equipment: None ADL Screening (condition at time of admission) Patient's cognitive ability adequate to safely complete daily activities?: Yes Is the patient deaf or have difficulty hearing?: No Does the patient have difficulty seeing, even when wearing glasses/contacts?: No Does the patient have difficulty concentrating, remembering, or making decisions?: No Patient able to express need for assistance with ADLs?: No Does the patient have difficulty dressing or bathing?: No Independently performs ADLs?: Yes (appropriate for developmental age) Does the patient have difficulty walking or climbing stairs?: Yes Weakness of Legs: Both Weakness of Arms/Hands: None  Permission Sought/Granted Permission sought to share information with : Case Manager, Family Supports, Customer service manager Permission granted to share information with : Yes, Verbal Permission Granted  Share Information with NAME: Kimberly Howell  Permission granted to share info w AGENCY: SNF  Permission granted to share info w Relationship: Son  Permission granted to share info w Contact Information: Kimberly Howell [phone]  Emotional Assessment Appearance:: Appears stated age Attitude/Demeanor/Rapport: Engaged (nods head - unable to verbalize due  to trach to vent at this time.)   Orientation: :  (Intubated/Trach) Alcohol / Substance Use: Not Applicable Psych Involvement: No (comment)  Admission diagnosis:  Acute respiratory failure ([place]) [J96.00] Cardiopulmonary arrest ([place]) [I46.9] Trauma  [T14.90XA] Patient Active Problem List   Diagnosis Date Noted  . Dysphagia   . Status post tracheostomy ([place])   . Palliative care encounter   . History of ETT   . Trauma   . Septic shock ([place])   . Encephalopathy acute   . Acute respiratory failure ([place]) 03/03/2020  . Cardiopulmonary arrest [facility])    PCP:  Seward Carol, MD Pharmacy:   [id number] [place], [place] [address] [place] [address] Phone: [phone] Fax: [phone]     Social Determinants of Health (SDOH) Interventions    Readmission Risk Interventions Readmission Risk Prevention Plan 03/14/2020  Transportation Screening Complete  PCP or Specialist Appt within 5-7 Days Complete  Home Care Screening Complete  Medication Review (RN CM) Complete

## 2020-03-15 NOTE — Progress Notes (Signed)
NAME:  Kimberly Howell, MRN:  [id number], DOB:  [birth year], LOS: 46 ADMISSION DATE:  03/03/2020, CONSULTATION DATE:  03/15/20 REFERRING MD:  EDP, CHIEF COMPLAINT:  Cardiac arrest   Brief History   74 year old female with past medical history of chronic respiratory failure with COPD on 3 L home O2 and CPAP nightly, stage IV CKD, hypertension, morbid obesity who had a cardiac arrest with >45 minutes CPR before ROSC then lost pulses three more times.  Intubated and PCCM consulted for admission  Past Medical History  Morbid obesity with BMI of 50.0-59.9, adult ([place])   Chronic respiratory failure (HCC)   CKD (chronic kidney disease) stage 3, GFR 30-59 ml/min ([place])   Essential hypertension   OSA on CPAP  Significant Hospital Events   9/18 Admit to PCCM  Consults:  Ophthamology ENT Neurology Palliative  Procedures:  9/18 ETT >> 9/28 9/21 CVC 3L >> to be taken out 9/29 post PEG 9/27 LP  9/28 Tracheostomy  9/29 IR PEG   Significant Diagnostic Tests:  CT head/C-spine/Maxillofacial>>Small left frontal scalp hematoma without skull fracture. Fracture of the left lamina papyracea with herniation of the left medial rectus muscle into the ethmoid sinus. Small amount of retro bulbar stranding, likely hemorrhage.  Right lung apex consolidation concerning for pneumonia. TTE 9/18 >>LVEF 60-65%; no regional wall abnormalities; G1DD, normal RV EEG 9/20 > diffuse encephalopathy MRI brain 9/24 >> Findings are highly suspicious for hypoxic/ischemic injury. Two punctate acute infarcts within the paramedian right frontal lobe are difficult to exclude. Medially displaced acute fracture of the left lamina papyracea. There is also an acute, displaced fracture of the left fovea ethmoidalis. Orbital fat and the medial rectus muscle extend into the left ethmoid sinus. Additionally, orbital fat extends from the left ethmoid sinus through the fracture defect in the fovea ethmoidalis intracranially into the left  anterior cranial fossa. The intracranial component of orbital fat measures 1 cm and exerts mild local mass effect upon the anteroinferior left frontal lobe.   Micro Data:  9/18 Sars-CoV-2>>negative 9/18 MRSA >> positive 9/27 CSF culture >> negative 9/27 UC >> negative 9/27 BC x 2 >> negative 9/28 BAL >> negative  Antimicrobials:   9/19 Unasyn >> 9/25 9/27 Vancomycin >> 9/27 (1 dose) 9/27 Ceftriaxone >> 9/29  Interim history/subjective:  Patient alert in bed, nodding "no" to the question of pain.  Mouthing "good morning".  Difficult to have more conversation with trach in place  Overnight no issues per RN and documentation TMAX: 38.4 Net - 1 L  Objective   Blood pressure 136/84, pulse 80, temperature 98.5 F (36.9 C), temperature source Oral, resp. rate 14, height _0  (1.727 m), weight 129.3 kg, SpO2 97 %.    Vent Mode: CPAP;PSV FiO2 (%):  [40 %-100 %] 40 % Set Rate:  [22 bmp] 22 bmp Vt Set:  [380 mL] 380 mL PEEP:  [5 cmH20] 5 cmH20 Pressure Support:  [15 cmH20] 15 cmH20 Plateau Pressure:  [19 cmH20-22 cmH20] 19 cmH20   Intake/Output Summary (Last 24 hours) at 03/15/2020 0813 Last data filed at 03/15/2020 0400 Gross per 24 hour  Intake 100 ml  Output 1025 ml  Net -925 ml   Filed Weights   03/13/20 0500 03/14/20 0500 03/15/20 0418  Weight: 132.9 kg 130.3 kg 129.3 kg   EXAM General: Adult female, lying in bed alert, NAD HEENT: MM pink/moist, anicteric, resolving scleral hemorrhage in L eye, PERRL +3, missing teeth Neuro: Alert, mouthing some words & nodding y/n appropriately sometimes, following  simple intermittent commands CV: s1s2, RRR, NSR on monitor, no murmurs/rubs/gallops, pulses +2 R/P Pulm: regular, non labored, trach in place on P/s CPAP w/ ventilator @ 40%, breath sounds: BUL/BLL- coarse/diminished GI: soft, obese, bs x 4, PEG tube in place to [facility] Skin: healing abrasion on forehead, minimal MSAD in groin area Extremities: warm/dry, no edema  noted  Labs/Imaging personally reviewed Na: 151 Hgb: 7.7  CXR 9/30: Resolution of subcutaneous emphysema post trach placement, RLL atelectasis, diffuse pulmonary infiltrates/edema and small bilateral pleural effusions present  Resolved Problem List:   AKI on CKD- now close to euvolemic and new baseline Cr  Assessment & Plan:   Cardiac arrest with hypoxic and hypercarbic respiratory failure Patient with prolonged downtime.  Greater than 45 minutes. Presumed PEA cardiac arrest.  Had 4 separate events with ROSC obtained. EKG without signs of ischemia.  Slow neurological recovery, family [place] 9/27 agreed for [facility] placement.   Lurline Idol (9/28), patient currently tolerating CPAP settings - PEG (9/29), restart TF 9/30, continue 200 mL free water flush to address hypernatremia - CM working towards LTACH placement - Daily BMET, replace electrolytes PRN - continuous cardiac monitoring  Periorbital fracture with hyphema  Herniation of the left medial rectus muscle into the sinus with small amount of retro bulbar stranding.  Ophthalmologist noted patient's EOM intact and no limitations of gaze & no signs of entrapment seen.  Therefore ENT recommends to not repair orbital injury at this time. Neurosurgery consulted 9/28, stating no indication for repair unless clear CSF leak which the patient has no symptoms of.  - Neuro Assessments Q 4 while in ICU - monitor for signs of CSF leak due - f/u with Ophthalmology outpatient for risk of enopthalmus  R sided Pneumonia Probable aspiration during cardiac event.  No recent symptoms per son, no leukocytosis or fever.  S/p unasyn x 7 days.  Recurrent fevers, started ceftriaxone 9/27 (for meningitis coverage + empiric aspiration), f/u BAL and culture data but suspect noninfectious.  - Recurrent fevers, TMAX- 38.4 / WBC stable: 10.3 - Suspect non infectious as BAL, BC/UC all negative - Encourage pulmonary hygiene  Type 2 diabetes  - SSI with CBG's -  continue Levemir 12 units BID - TF to restart 9/30 AM  HFpEF- Echo Grade 1 diastolic dysfunction Hypertension Home medications: amlodipine, carvedilol, furosemide, lisinopril on hold s/p pressor requirement this admission.  Patient is currently euvolemic, EF 60-65%, SBP 110's- 140's. Can consider restarting if needed.  - Strict I/O - Daily BMET  Constipation- Improved - LBM 9/30 per RN report - Continue Miralax & Colace PRN - TF to be resumed 9/30  Best practice:  Diet: NPO with TFs  Pain/Anxiety/Delirium protocol (if indicated): Fentanyl IVP PRN VAP protocol (if indicated): in place: HOB 30 degrees, suction as needed DVT prophylaxis: Heparin SQ/ SCDs GI prophylaxis: Protonix Glucose control: SSI & levemir BID Mobility: Bedrest, mobilize as tolerated Code Status: DNR Family Communication: 9/30- updated sister bedside Disposition: ICU  Critical care time:      Domingo Pulse [place], AGACNP-BC [facility]    Please see Amion for pager details.

## 2020-03-15 NOTE — TOC Progression Note (Signed)
Transition of Care [facility]) - Progression Note    Patient Details  Name: Kimberly Howell MRN: [id number] Date of Birth: 02-06-1946  Transition of Care [facility]) CM/SW [place], Why Phone Number: 03/15/2020, 3:58 PM  Clinical Narrative:     CSW spoke with Prem with [facility], Santiago Glad with [facility] who gave CSW permission to fax over initial referral for patient and provided fax number to fax over clinicals. CSW faxed over clinicals for review.  Pending SNF placement.  CSW will continue to follow.  Expected Discharge Plan: Skilled Nursing Facility Barriers to Discharge: Continued Medical Work up  Expected Discharge Plan and Services Expected Discharge Plan: [place]   Discharge Planning Services: CM Consult Post Acute Care Choice: Long Term Acute Care (LTAC) Living arrangements for the past 2 months: Single Family Home                                       Social Determinants of Health (SDOH) Interventions    Readmission Risk Interventions Readmission Risk Prevention Plan 03/14/2020  Transportation Screening Complete  PCP or Specialist Appt within 5-7 Days Complete  Home Care Screening Complete  Medication Review (RN CM) Complete

## 2020-03-15 NOTE — Plan of Care (Signed)
  Problem: Clinical Measurements: Goal: Ability to maintain clinical measurements within normal limits will improve Outcome: Progressing Goal: Will remain free from infection Outcome: Progressing Goal: Diagnostic test results will improve Outcome: Progressing Goal: Cardiovascular complication will be avoided Outcome: Progressing   Problem: Activity: Goal: Risk for activity intolerance will decrease Outcome: Progressing   Problem: Coping: Goal: Level of anxiety will decrease Outcome: Progressing   Problem: Pain Managment: Goal: General experience of comfort will improve Outcome: Progressing   Problem: Safety: Goal: Ability to remain free from injury will improve Outcome: Progressing   Problem: Skin Integrity: Goal: Risk for impaired skin integrity will decrease Outcome: Progressing   Problem: Activity: Goal: Ability to tolerate increased activity will improve Outcome: Progressing   Problem: Respiratory: Goal: Ability to maintain a clear airway and adequate ventilation will improve Outcome: Progressing   Problem: Health Behavior/Discharge Planning: Goal: Ability to manage health-related needs will improve Outcome: Not Progressing   Problem: Clinical Measurements: Goal: Respiratory complications will improve Outcome: Not Progressing   Problem: Nutrition: Goal: Adequate nutrition will be maintained Outcome: Not Progressing   Problem: Elimination: Goal: Will not experience complications related to bowel motility Outcome: Not Progressing Goal: Will not experience complications related to urinary retention Outcome: Not Progressing

## 2020-03-15 NOTE — Progress Notes (Signed)
eLink Physician-Brief Progress Note Patient Name: ANUREET BRUINGTON DOB: April 21, 1946 MRN: [id number]   Date of Service  03/15/2020  HPI/Events of Note  Patient needs Flexiseal for copious diarrhea, platelet count is normal.  eICU Interventions  Flexiseal order entered.        Kerry Kass Nicky [place] 03/15/2020, 10:01 PM

## 2020-03-15 NOTE — Telephone Encounter (Signed)
Okay, thanks for info. She is admitted currently (ICU) . Can discuss once f/up is scheduled.

## 2020-03-15 NOTE — NC FL2 (Addendum)
[place] LEVEL OF CARE SCREENING TOOL     IDENTIFICATION  Patient Name: Kimberly Howell Birthdate: 1946-02-14 Sex: female Admission Date (Current Location): 03/03/2020  [facility] and [place] Number:  Herbalist and Address:  [facility], [place] [address], [place], [place] [address]      Provider Number: [id number]  Attending Physician Name and Address:  Audria Nine, DO  Relative Name and Phone Number:       Current Level of Care: Hospital Recommended Level of Care: Vent SNF Prior Approval Number:    Date Approved/Denied:   PASRR Number: [id number]  Discharge Plan: SNF (Vent SNF)    Current Diagnoses: Patient Active Problem List   Diagnosis Date Noted  . Dysphagia   . Status post tracheostomy ([place])   . Palliative care encounter   . History of ETT   . Trauma   . Septic shock ([place])   . Encephalopathy acute   . Acute respiratory failure ([place]) 03/03/2020  . Cardiopulmonary arrest ([place])     Orientation RESPIRATION BLADDER Height & Weight      (intubated; tracheostomy)  Tracheostomy, Vent (40% Vent; [place] 93mm cuffed trach) Incontinent Weight: 285 lb 0.9 oz (129.3 kg) Height:  5\' 8"  (172.7 cm)  BEHAVIORAL SYMPTOMS/MOOD NEUROLOGICAL BOWEL NUTRITION STATUS      Incontinent Diet, Feeding tube (see discharge summary)  AMBULATORY STATUS COMMUNICATION OF NEEDS Skin   Total Care Verbally  abrasion on chest; contact dermatitis; cracking heels; generalized ecchymosis; rash on groin/perineum; MASD on pelvis                       Personal Care Assistance Level of Assistance  Bathing, Feeding, Dressing, Total care Bathing Assistance: Maximum assistance Feeding assistance: Maximum assistance Dressing Assistance: Maximum assistance Total Care Assistance: Maximum assistance   Functional Limitations Info  Sight, Hearing, Speech Sight Info: Adequate Hearing Info: Adequate Speech Info: Impaired (intubated)    SPECIAL  CARE FACTORS FREQUENCY  PT (By licensed PT), OT (By licensed OT)     PT Frequency: 5x week OT Frequency: 5x week            Contractures Contractures Info: Not present    Additional Factors Info  Code Status, Allergies, Insulin Sliding Scale Code Status Info: DNR Allergies Info: Prednisone   Insulin Sliding Scale Info: insulin aspart (novoLOG) injection 0-15 Units every 4 hours       Current Medications (03/15/2020):  This is the current hospital active medication list Current Facility-Administered Medications  Medication Dose Route Frequency Provider Last Rate Last Admin  . 0.9 %  sodium chloride infusion  250 mL Intravenous Continuous Deland Pretty, MD   Stopped at 03/06/20 1523  . acetaminophen (TYLENOL) 160 MG/5ML solution 650 mg  650 mg Per Tube Q6H PRN Icard, Bradley L, DO   650 mg at 03/12/20 2131  . chlorhexidine (PERIDEX) 0.12 % solution 15 mL  15 mL Mouth Rinse BID Candee Furbish, MD   15 mL at 03/15/20 1045  . Chlorhexidine Gluconate Cloth 2 % PADS 6 each  6 each Topical Daily Candee Furbish, MD   6 each at 03/13/20 2216  . docusate (COLACE) 50 MG/5ML liquid 100 mg  100 mg Per Tube BID PRN Icard, Bradley L, DO   100 mg at 03/11/20 1018  . feeding supplement (PROSource TF) liquid 45 mL  45 mL Per Tube Daily Audria Nine, DO   45 mL at 03/15/20  1045  . feeding supplement (VITAL AF 1.2 CAL) liquid 1,000 mL  1,000 mL Per Tube Continuous Audria Nine, DO 60 mL/hr at 03/15/20 1046 1,000 mL at 03/15/20 1046  . fentaNYL (SUBLIMAZE) injection 25-50 mcg  25-50 mcg Intravenous Q4H PRN Eliezer Bottom T, MD      . free water 200 mL  200 mL Per Tube Q4H Jennelle Human B, NP   200 mL at 03/15/20 1222  . heparin injection 5,000 Units  5,000 Units Subcutaneous Q8H Candee Furbish, MD   5,000 Units at 03/15/20 1508  . insulin aspart (novoLOG) injection 0-15 Units  0-15 Units Subcutaneous Q4H Gleason, Otilio Carpen, PA-C   2 Units at 03/13/20 0011  . iohexol (OMNIPAQUE) 300  MG/ML solution 50 mL  50 mL Per Tube Once PRN Suttle, Rosanne Ashing, MD      . MEDLINE mouth rinse  15 mL Mouth Rinse q12n4p Candee Furbish, MD   15 mL at 03/15/20 1222  . ondansetron (ZOFRAN) injection 4 mg  4 mg Intravenous Q6H PRN Deland Pretty, MD      . pantoprazole (PROTONIX) injection 40 mg  40 mg Intravenous QHS Candee Furbish, MD   40 mg at 03/14/20 2119  . polyethylene glycol (MIRALAX / GLYCOLAX) packet 17 g  17 g Per Tube Daily PRN Icard, Bradley L, DO      . scopolamine (TRANSDERM-SCOP) 1 MG/3DAYS 1.5 mg  1 patch Transdermal Q72H Candee Furbish, MD   1.5 mg at 03/13/20 0942  . sodium chloride flush (NS) 0.9 % injection 10-40 mL  10-40 mL Intracatheter Q12H Candee Furbish, MD   10 mL at 03/15/20 1000  . sodium chloride flush (NS) 0.9 % injection 10-40 mL  10-40 mL Intracatheter PRN Candee Furbish, MD      . sorbitol 70 % solution 30 mL  30 mL Per Tube Daily Candee Furbish, MD   30 mL at 03/15/20 1046     Discharge Medications: Please see discharge summary for a list of discharge medications.  Relevant Imaging Results:  Relevant Lab Results:   Additional Information SS# [phone] [id number]; J&J vaccine  Alexander Mt, LCSW

## 2020-03-16 ENCOUNTER — Inpatient Hospital Stay (HOSPITAL_COMMUNITY): Payer: Medicare HMO

## 2020-03-16 ENCOUNTER — Other Ambulatory Visit: Payer: Self-pay | Admitting: Family Medicine

## 2020-03-16 DIAGNOSIS — Z930 Tracheostomy status: Secondary | ICD-10-CM | POA: Diagnosis not present

## 2020-03-16 DIAGNOSIS — N179 Acute kidney failure, unspecified: Secondary | ICD-10-CM

## 2020-03-16 DIAGNOSIS — Z9911 Dependence on respirator [ventilator] status: Secondary | ICD-10-CM | POA: Diagnosis not present

## 2020-03-16 DIAGNOSIS — J9601 Acute respiratory failure with hypoxia: Secondary | ICD-10-CM | POA: Diagnosis not present

## 2020-03-16 LAB — TRIGLYCERIDES: Triglycerides: 180 mg/dL — ABNORMAL HIGH (ref <150)

## 2020-03-16 LAB — GLUCOSE, CAPILLARY
Glucose-Capillary: 139 mg/dL — ABNORMAL HIGH (ref 70–99)
Glucose-Capillary: 168 mg/dL — ABNORMAL HIGH (ref 70–99)
Glucose-Capillary: 163 mg/dL — ABNORMAL HIGH (ref 70–99)
Glucose-Capillary: 142 mg/dL — ABNORMAL HIGH (ref 70–99)
Glucose-Capillary: 157 mg/dL — ABNORMAL HIGH (ref 70–99)

## 2020-03-16 LAB — CBC WITH DIFFERENTIAL/PLATELET
Abs Immature Granulocytes: 0.07 10*3/uL (ref 0.00–0.07)
Basophils Absolute: 0.1 10*3/uL (ref 0.0–0.1)
Basophils Relative: 1 %
Eosinophils Absolute: 0.5 10*3/uL (ref 0.0–0.5)
Eosinophils Relative: 4 %
HCT: 26.6 % — ABNORMAL LOW (ref 36.0–46.0)
Hemoglobin: 7.9 g/dL — ABNORMAL LOW (ref 12.0–15.0)
Immature Granulocytes: 1 %
Lymphocytes Relative: 10 %
Lymphs Abs: 1.2 10*3/uL (ref 0.7–4.0)
MCH: 29.7 pg (ref 26.0–34.0)
MCHC: 29.7 g/dL — ABNORMAL LOW (ref 30.0–36.0)
MCV: 100 fL (ref 80.0–100.0)
Monocytes Absolute: 0.9 10*3/uL (ref 0.1–1.0)
Monocytes Relative: 7 %
Neutro Abs: 9.3 10*3/uL — ABNORMAL HIGH (ref 1.7–7.7)
Neutrophils Relative %: 77 %
Platelets: 299 10*3/uL (ref 150–400)
RBC: 2.66 MIL/uL — ABNORMAL LOW (ref 3.87–5.11)
RDW: 15.5 % (ref 11.5–15.5)
WBC: 12.1 10*3/uL — ABNORMAL HIGH (ref 4.0–10.5)
nRBC: 0 % (ref 0.0–0.2)

## 2020-03-16 LAB — CSF CULTURE W GRAM STAIN
Culture: NO GROWTH
Special Requests: NORMAL

## 2020-03-16 LAB — COMPREHENSIVE METABOLIC PANEL
ALT: 44 U/L (ref 0–44)
AST: 70 U/L — ABNORMAL HIGH (ref 15–41)
Albumin: 2.4 g/dL — ABNORMAL LOW (ref 3.5–5.0)
Alkaline Phosphatase: 80 U/L (ref 38–126)
Anion gap: 14 (ref 5–15)
BUN: 66 mg/dL — ABNORMAL HIGH (ref 8–23)
CO2: 25 mmol/L (ref 22–32)
Calcium: 8.7 mg/dL — ABNORMAL LOW (ref 8.9–10.3)
Chloride: 110 mmol/L (ref 98–111)
Creatinine, Ser: 2.16 mg/dL — ABNORMAL HIGH (ref 0.44–1.00)
GFR calc Af Amer: 26 mL/min — ABNORMAL LOW (ref >60)
GFR calc non Af Amer: 22 mL/min — ABNORMAL LOW (ref >60)
Glucose, Bld: 174 mg/dL — ABNORMAL HIGH (ref 70–99)
Potassium: 3.6 mmol/L (ref 3.5–5.1)
Sodium: 149 mmol/L — ABNORMAL HIGH (ref 135–145)
Total Bilirubin: 0.8 mg/dL (ref 0.3–1.2)
Total Protein: 6 g/dL — ABNORMAL LOW (ref 6.5–8.1)

## 2020-03-16 MED ORDER — FREE WATER
300.0000 mL | Status: DC
  Administered 2020-03-16 – 2020-03-19 (×18): 300 mL

## 2020-03-16 MED ORDER — VANCOMYCIN HCL 10 G IV SOLR
2500.0000 mg | Freq: Once | INTRAVENOUS | Status: AC
  Administered 2020-03-16: 2500 mg via INTRAVENOUS
  Filled 2020-03-16: qty 2500

## 2020-03-16 MED ORDER — SODIUM CHLORIDE 0.9 % IV SOLN
2.0000 g | Freq: Two times a day (BID) | INTRAVENOUS | Status: DC
  Administered 2020-03-16 – 2020-03-19 (×6): 2 g via INTRAVENOUS
  Filled 2020-03-16 (×6): qty 2

## 2020-03-16 MED ORDER — VANCOMYCIN HCL IN DEXTROSE 2-5 GM/500ML-% IV SOLN
2500.0000 mg | Freq: Once | INTRAVENOUS | Status: DC
  Filled 2020-03-16: qty 2500

## 2020-03-16 MED ORDER — WHITE PETROLATUM EX OINT
TOPICAL_OINTMENT | CUTANEOUS | Status: DC | PRN
  Administered 2020-03-16: 0.2 via TOPICAL

## 2020-03-16 MED ORDER — VANCOMYCIN HCL IN DEXTROSE 1.5-5 GM/300ML-% IV SOLN
1500.0000 mg | INTRAVENOUS | Status: DC
  Administered 2020-03-17 – 2020-03-18 (×2): 1500 mg via INTRAVENOUS
  Filled 2020-03-16 (×2): qty 1500

## 2020-03-16 MED ORDER — WHITE PETROLATUM EX OINT
TOPICAL_OINTMENT | CUTANEOUS | Status: AC
  Filled 2020-03-16: qty 28.35

## 2020-03-16 NOTE — Progress Notes (Signed)
Occupational Therapy Treatment Patient Details Name: Kimberly Howell MRN: [id number] DOB: 04-04-1946 Today's Date: 03/16/2020    History of present illness 74 yo admitted with cardiac arrest and fall on 9/18 s/p CPR with resultant left orbit fx. Intubated 9/18, trach 9/ 28, PEG 9/29. PMhx: morbid obesity, HTN, respiratory failure on 3L, CKD stage 4   OT comments  Pt more alert, attempting to mouth words, encouraged to respond to yes and no questions with head movement. Pt with some slight improvement in elbow strength, but not able to move against gravity. Performed B UE P/AAROM and worked on trunk strengthening/bed mobility rolling. Pt with stable VS throughout session with ventilator support.   Follow Up Recommendations  LTACH;Supervision/Assistance - 24 hour    Equipment Recommendations  Other (comment) (defer to next venue)    Recommendations for Other Services      Precautions / Restrictions Precautions Precautions: Fall;Other (comment) Precaution Comments: trach, vent, PEG        Mobility Bed Mobility   Bed Mobility: Rolling Rolling: +2 for physical assistance;Total assist         General bed mobility comments: pt can maintain sidelying briefly once placed, during pericare  Transfers                      Balance Overall balance assessment: Needs assistance   Sitting balance-Leahy Scale: Zero Sitting balance - Comments: placed pt in chair position using features of bed                                   ADL either performed or assessed with clinical judgement   ADL                                         General ADL Comments: dependent in all aspects     Vision       Perception     Praxis      Cognition Arousal/Alertness: Awake/alert Behavior During Therapy: WFL for tasks assessed/performed Overall Cognitive Status: Impaired/Different from baseline Area of Impairment: Orientation;Attention;Memory;Following  commands                 Orientation Level: Disoriented to;Place;Time;Situation Current Attention Level: Focused Memory: Decreased short-term memory Following Commands: Follows one step commands inconsistently;Follows one step commands with increased time       General Comments: improved consistency with ability to follow simple commands with motor output        Exercises Exercises: General Upper Extremity General Exercises - Upper Extremity Shoulder Flexion: PROM;Both;5 reps;Supine Elbow Flexion: AAROM;Both;5 reps;Supine Elbow Extension: AAROM;Both;5 reps;Supine Wrist Flexion: PROM;Both;5 reps;Supine Wrist Extension: PROM;Both;5 reps;Supine Digit Composite Flexion: PROM;Both;5 reps;Supine Composite Extension: PROM;Both;5 reps;Supine   Shoulder Instructions       General Comments      Pertinent Vitals/ Pain       Pain Assessment: Faces Faces Pain Scale: Hurts even more Pain Location: generalized with movement, ROM Pain Descriptors / Indicators: Grimacing Pain Intervention(s): Monitored during session;Repositioned  Home Living                                          Prior Functioning/Environment  Frequency  Min 2X/week        Progress Toward Goals  OT Goals(current goals can now be found in the care plan section)  Progress towards OT goals: Progressing toward goals  Acute Rehab OT Goals OT Goal Formulation: Patient unable to participate in goal setting Time For Goal Achievement: 03/28/20 Potential to Achieve Goals: [place] Discharge plan remains appropriate    Co-evaluation    PT/OT/SLP Co-Evaluation/Treatment: Yes Reason for Co-Treatment: Complexity of the patient's impairments (multi-system involvement);For patient/therapist safety   OT goals addressed during session: Strengthening/ROM      AM-PAC OT "6 Clicks" Daily Activity     Outcome Measure   Help from another person eating meals?: Total Help  from another person taking care of personal grooming?: Total Help from another person toileting, which includes using toliet, bedpan, or urinal?: Total Help from another person bathing (including washing, rinsing, drying)?: Total Help from another person to put on and taking off regular upper body clothing?: Total Help from another person to put on and taking off regular lower body clothing?: Total 6 Click Score: 6    End of Session    OT Visit Diagnosis: Muscle weakness (generalized) (M62.81);Pain;History of falling (Z91.81);Other symptoms and signs involving cognitive function   Activity Tolerance Patient tolerated treatment well   Patient Left in bed;with call bell/phone within reach   Nurse Communication Other (comment) (assisted with pericare, replacing sacral pad)        Time: 9450-3888 OT Time Calculation (min): 33 min  Charges: OT General Charges $OT Visit: 1 Visit OT Treatments $Therapeutic Activity: 8-22 mins  Nestor Lewandowsky, OTR/L Acute Rehabilitation Services Pager: [phone] Office: [phone]   [name] 03/16/2020, 11:35 AM

## 2020-03-16 NOTE — Progress Notes (Signed)
Pharmacy Antibiotic Note  Kimberly Howell is a 74 y.o. female admitted on 03/03/2020 with aspiration PNA s/p a course of antibiotics.  Pharmacy now consulted to start vancomycin and cefepime for sepsis.  AKI improving, Tmax 100.4, WBC up to 12.1, PCT was 0.5.  Plan: Vanc 2522m IV x 1, then 15093mIV Q24H Cefepime 2gm IV Q12H Monitor renal fxn, clinical progress, vanc trough at Css  Height: _0  (172.7 cm) Weight: 133.4 kg (294 lb 1.5 oz) IBW/kg (Calculated) : 63.9  Temp (24hrs), Avg:99.6 F (37.6 C), Min:99 F (37.2 C), Max:100.4 F (38 C)  Recent Labs  Lab 03/12/20 0456 03/13/20 0417 03/14/20 1027 03/15/20 0228 03/16/20 1140  WBC 10.5 13.5* 10.5 10.3 12.1*  CREATININE 3.12* 2.86* 2.71* 2.71* 2.16*    Estimated Creatinine Clearance: 34.6 mL/min (A) (by C-G formula based on SCr of 2.16 mg/dL (H)).    Allergies  Allergen Reactions  . Prednisone Other (See Comments)    Increases blood sugar    Unasyn 9/19 > 9/26 Vanc x1 9/27, restarted 10/1 >> Cefepime 10/1 >> CTX 9/27 >> 9/29  9/18 MRSA PCR - positive 9/18 COVID- neg 9/27 UCx - negative 9/27 BCx - NGTD 9/27 CSF - negative 9/28 BAL - negative 10/1 TA -   Amenda Duclos D. DaMina MarblePharmD, BCPS, BCLawnside0/06/2019, 6:56 PM

## 2020-03-16 NOTE — Progress Notes (Signed)
NAME:  Kimberly Howell, MRN:  [id number], DOB:  Nov 12, 1945, LOS: 75 ADMISSION DATE:  03/03/2020, CONSULTATION DATE:  03/16/20 REFERRING MD:  EDP, CHIEF COMPLAINT:  Cardiac arrest   Brief History   74 year old female with past medical history of chronic respiratory failure with COPD on 3 L home O2 and CPAP nightly, stage IV CKD, hypertension, morbid obesity who had a cardiac arrest with >45 minutes CPR before ROSC then lost pulses three more times.  Intubated and PCCM consulted for admission  Past Medical History  Morbid obesity with BMI of 50.0-59.9, adult ([place])   Chronic respiratory failure (HCC)   CKD (chronic kidney disease) stage 3, GFR 30-59 ml/min ([place])   Essential hypertension   OSA on CPAP  Significant Hospital Events   9/18 Admit to PCCM  Consults:  Ophthamology ENT Neurology Palliative  Procedures:  9/18 ETT >> 9/28 9/21 CVC 3L >> to be taken out 9/29 post PEG 9/27 LP  9/28 Tracheostomy  9/29 IR PEG   Significant Diagnostic Tests:  CT head/C-spine/Maxillofacial>>Small left frontal scalp hematoma without skull fracture. Fracture of the left lamina papyracea with herniation of the left medial rectus muscle into the ethmoid sinus. Small amount of retro bulbar stranding, likely hemorrhage.  Right lung apex consolidation concerning for pneumonia. TTE 9/18 >>LVEF 60-65%; no regional wall abnormalities; G1DD, normal RV EEG 9/20 > diffuse encephalopathy MRI brain 9/24 >> Findings are highly suspicious for hypoxic/ischemic injury. Two punctate acute infarcts within the paramedian right frontal lobe are difficult to exclude. Medially displaced acute fracture of the left lamina papyracea. There is also an acute, displaced fracture of the left fovea ethmoidalis. Orbital fat and the medial rectus muscle extend into the left ethmoid sinus. Additionally, orbital fat extends from the left ethmoid sinus through the fracture defect in the fovea ethmoidalis intracranially into the left  anterior cranial fossa. The intracranial component of orbital fat measures 1 cm and exerts mild local mass effect upon the anteroinferior left frontal lobe.   Micro Data:  9/18 Sars-CoV-2>>negative 9/18 MRSA >> positive 9/27 CSF culture >> negative 9/27 UC >> negative 9/27 BC x 2 >> negative 9/28 BAL >> negative  Antimicrobials:   9/19 Unasyn >> 9/25 9/27 Vancomycin >> 9/27 (1 dose) 9/27 Ceftriaxone >> 9/29  Interim history/subjective:  Patient alert in bed, nodding "no" to the question of pain.  Mouthing "good morning".  Difficult to have more conversation with trach in place  Overnight no issues per RN and documentation TMAX: 38.4 Net - 1 L  Objective   Blood pressure 140/69, pulse 86, temperature 99.1 F (37.3 C), temperature source Oral, resp. rate (!) 28, height _0  (1.727 m), weight 133.4 kg, SpO2 98 %.    Vent Mode: CPAP;PSV FiO2 (%):  [40 %] 40 % Set Rate:  [22 bmp] 22 bmp Vt Set:  [380 mL] 380 mL PEEP:  [5 cmH20] 5 cmH20 Pressure Support:  [10 cmH20-15 cmH20] 10 cmH20 Plateau Pressure:  [17 cmH20-22 cmH20] 17 cmH20   Intake/Output Summary (Last 24 hours) at 03/16/2020 1005 Last data filed at 03/16/2020 0600 Gross per 24 hour  Intake 2200 ml  Output 950 ml  Net 1250 ml   Filed Weights   03/14/20 0500 03/15/20 0418 03/16/20 0500  Weight: 130.3 kg 129.3 kg 133.4 kg   EXAM General: 74 yr old AA obese, female lying in bed, unwell appearing  HEENT: moist  MM, resolving scleral hemorrhage in left eye, PERRLA +3, poor dentition, missing front teeth Neuro: Alert,  blinking, grimacing, moving mouth and nods when asked a question, unable to move limbs  CV:S1, S2 present, RRR, no rubs, gallops or murmurs, Pulm: CTAB, trach in place on P/s CPAP w/ ventilator @ 40%, breath sounds:  GI: generalized tenderness, no guarding, bowel sounds present, PEG tube in place to [facility] Skin: healing abrasion on forehead Extremities: warm/dry, no edema noted  Labs/Imaging personally  reviewed Na: 151 Hgb: 7.7  CXR 9/30: Resolution of subcutaneous emphysema post trach placement, RLL atelectasis, diffuse pulmonary infiltrates/edema and small bilateral pleural effusions present  Resolved Problem List:   AKI on CKD- now close to euvolemic and new baseline Cr  Assessment & Plan:   Cardiac arrest with hypoxic and hypercarbic respiratory failure Patient with prolonged downtime.  Greater than 45 minutes. Presumed PEA cardiac arrest.  Had 4 separate events with ROSC obtained. EKG without signs of ischemia.  Slow neurological recovery, family [place] 9/27 agreed for [facility] placement.  -Trach (9/28), patient currently tolerating CPAP settings -PEG (9/29), restart TF 9/30, continue 200 mL free water flush to address hypernatremia -CM working towards LTACH placement -Daily BMET, replace electrolytes PRN -Continuous cardiac monitoring  Periorbital fracture with hyphema  Herniation of the left medial rectus muscle into the sinus with small amount of retro bulbar stranding.  Ophthalmologist noted patient's EOM intact and no limitations of gaze & no signs of entrapment seen.  Therefore ENT recommends to not repair orbital injury at this time. Neurosurgery consulted 9/28, stating no indication for repair unless clear CSF leak which the patient has no symptoms of. - Neuro Assessments Q4H while in ICU - monitor for signs of CSF leak due - f/u with Ophthalmology outpatient for risk of enopthalmus  R sided Pneumonia Probable aspiration during cardiac event.  No recent symptoms per son, no leukocytosis or fever.  S/p unasyn x 7 days.  s/p ceftriaxone 9/27, s/p cefazolin (for meningitis coverage + empiric aspiration), f/u BAL and culture data but suspect noninfectious. - Recurrent fevers, afebrile overnight, WBC stable: 10.3 on 30/9, labs pending today - Suspect non infectious as BAL, BC/UC all negative - Encourage pulmonary hygiene  Type 2 diabetes CBGs 168, 139  -SSI with  CBG's  HFpEF Echo Grade 1 diastolic dysfunction -Strict I/O  Hypertension SBP 130-140s  Home medications: amlodipine, carvedilol, furosemide, lisinopril on hold s/p pressor requirement this admission.  Patient is currently euvolemic, EF 60-65%,  -Strict I/O -Daily BMET  Constipation- Improved - LBM 9/30 per RN report - Continue Miralax & Colace PRN - TF to be resumed 9/30  DVT ppx -Continue Heparin 5000 units TID   Best practice:  Diet: NPO with TFs  Pain/Anxiety/Delirium protocol (if indicated): Fentanyl IVP PRN VAP protocol (if indicated): in place: HOB 30 degrees, suction as needed DVT prophylaxis: Heparin SQ/ SCDs GI prophylaxis: Protonix Glucose control: SSI & levemir BID Mobility: Bedrest, mobilize as tolerated Code Status: DNR Family Communication:  Disposition: ICU  Critical care time:      Lattie Haw MD   Please see Amion for pager details.

## 2020-03-16 NOTE — Progress Notes (Signed)
Physical Therapy Treatment Patient Details Name: Kimberly Howell MRN: [id number] DOB: [birth year] Today's Date: 03/16/2020    History of Present Illness 74 yo admitted with cardiac arrest and fall on 9/18 s/p CPR with resultant left orbit fx. Intubated 9/18, trach 9/ 28, PEG 9/29. PMhx: morbid obesity, HTN, respiratory failure on 3L, CKD stage 4    PT Comments    Pt with improved cognition, attention and command following today with noted activation of bil UE and bil LE 2-/5 with only limited knee extension and hip Abduct/ADD. Pt with grimace to knee hip/knee flexion without active flexion. Pt with continued total +2 assist for rolling and mobility with lack of awareness of deficits, place and situation. Will continue to follow and push for increased mobility as cognition improves.  FiO2 40% on PRVC    Follow Up Recommendations  SNF;Supervision/Assistance - 24 hour     Equipment Recommendations  Hospital bed;Other (comment);Wheelchair (measurements PT);Wheelchair cushion (measurements PT) (hoyer lift)    Recommendations for Other Services       Precautions / Restrictions Precautions Precautions: Fall;Other (comment) Precaution Comments: trach, vent, PEG     Mobility  Bed Mobility Overal bed mobility: Needs Assistance Bed Mobility: Rolling Rolling: +2 for physical assistance;Total assist         General bed mobility comments: pt can maintain sidelying briefly once placed, during pericare with mod cues. Total +2 assist to bring trunk forward off of elevated surface in chair position  Transfers                    Ambulation/Gait                 Stairs             Wheelchair Mobility    Modified Rankin (Stroke Patients Only)       Balance Overall balance assessment: Needs assistance   Sitting balance-Leahy Scale: Zero Sitting balance - Comments: placed pt in chair position using features of bed with tendency for left lean                                     Cognition Arousal/Alertness: Awake/alert Behavior During Therapy: WFL for tasks assessed/performed Overall Cognitive Status: Impaired/Different from baseline Area of Impairment: Orientation;Attention;Memory;Following commands                 Orientation Level: Disoriented to;Place;Time;Situation Current Attention Level: Focused Memory: Decreased short-term memory Following Commands: Follows one step commands inconsistently;Follows one step commands with increased time       General Comments: improved consistency with ability to follow simple commands with motor output, consistently answering "no" to being in the hospital.      Exercises General Exercises - Upper Extremity Shoulder Flexion: PROM;Both;5 reps;Supine Elbow Flexion: AAROM;Both;5 reps;Supine Elbow Extension: AAROM;Both;5 reps;Supine Wrist Flexion: PROM;Both;5 reps;Supine Wrist Extension: PROM;Both;5 reps;Supine Digit Composite Flexion: PROM;Both;5 reps;Supine Composite Extension: PROM;Both;5 reps;Supine General Exercises - Lower Extremity Short Arc Quad: AROM;Both;Seated;5 reps Heel Slides: PROM;Both;Supine;5 reps Hip ABduction/ADduction: AROM;Both;5 reps    General Comments        Pertinent Vitals/Pain Pain Assessment: Faces Faces Pain Scale: Hurts even more Pain Location: generalized with movement particularly bil shoulder and knee ROM Pain Descriptors / Indicators: Grimacing Pain Intervention(s): Limited activity within patient's tolerance;Monitored during session;Repositioned    Home Living  Prior Function            PT Goals (current goals can now be found in the care plan section) Progress towards PT goals: Progressing toward goals    Frequency    Min 2X/week      PT Plan Current plan remains appropriate    Co-evaluation PT/OT/SLP Co-Evaluation/Treatment: Yes Reason for Co-Treatment: Complexity of the patient's  impairments (multi-system involvement) PT goals addressed during session: Mobility/safety with mobility;Strengthening/ROM OT goals addressed during session: Strengthening/ROM      AM-PAC PT "6 Clicks" Mobility   Outcome Measure  Help needed turning from your back to your side while in a flat bed without using bedrails?: Total Help needed moving from lying on your back to sitting on the side of a flat bed without using bedrails?: Total Help needed moving to and from a bed to a chair (including a wheelchair)?: Total Help needed standing up from a chair using your arms (e.g., wheelchair or bedside chair)?: Total Help needed to walk in hospital room?: Total Help needed climbing 3-5 steps with a railing? : Total 6 Click Score: 6    End of Session   Activity Tolerance: Patient tolerated treatment well Patient left: in bed;with call bell/phone within reach;with bed alarm set;with nursing/sitter in room Nurse Communication: Mobility status;Need for lift equipment PT Visit Diagnosis: Other abnormalities of gait and mobility (R26.89);Muscle weakness (generalized) (M62.81);Other symptoms and signs involving the nervous system (R29.898)     Time: 0623-7628 PT Time Calculation (min) (ACUTE ONLY): 33 min  Charges:  $Therapeutic Activity: 8-22 mins                     Joseph Bias P, PT Acute Rehabilitation Services Pager: [phone] Office: [place] Leonetta Mcgivern 03/16/2020, 1:06 PM

## 2020-03-16 NOTE — Progress Notes (Signed)
SLP Cancellation Note  Patient Details Name: Kimberly Howell MRN: [id number] DOB: 1946-05-01   Cancelled treatment:       Reason Eval/Treat Not Completed: Other (comment) (Pt remains on the vent at this time. SLP will follow up next week for possible inline PMV assessment if pt is appropriate.)  Tuwana Kapaun I. Hardin Negus, [place], [place] Office number [phone] Pager [place] 03/16/2020, 11:38 AM

## 2020-03-16 NOTE — TOC Progression Note (Addendum)
Transition of Care [facility]) - Progression Note    Patient Details  Name: Kimberly Howell MRN: [id number] Date of Birth: 01-Jan-1946  Transition of Care [facility]) CM/SW [place], [place] Phone Number: 03/16/2020, 2:22 PM  Clinical Narrative:     Update 2:30pm- CSW received a call from Prem with [facility] who confirmed that they cannot accept patient for SNF placement due to insurance. CSW awaiting call back from [facility] to see if they can offer on patient.  CSW will continue to follow.  CSW called to follow up on referrals faxed over to [place], [facility], and [facility]. Santiago Glad with [facility] confirmed with CSW that at the moment there are no female beds available. Santiago Glad will call CSW if bed becomes available. [name] with [facility] was not in office today. CSW left voicemail for [name] to call CSW back regarding referral. CSW awaiting call back. CSW tried to call Prem with [facility] and no answer. CSW left a voicemail with Prem. CSW awaiting call back.  Pending bed offers.  CSW will continue to follow.  Expected Discharge Plan: Skilled Nursing Facility Barriers to Discharge: Continued Medical Work up  Expected Discharge Plan and Services Expected Discharge Plan: [place]   Discharge Planning Services: CM Consult Post Acute Care Choice: Long Term Acute Care (LTAC) Living arrangements for the past 2 months: Single Family Home                                       Social Determinants of Health (SDOH) Interventions    Readmission Risk Interventions Readmission Risk Prevention Plan 03/14/2020  Transportation Screening Complete  PCP or Specialist Appt within 5-7 Days Complete  Home Care Screening Complete  Medication Review (RN CM) Complete

## 2020-03-17 DIAGNOSIS — J9621 Acute and chronic respiratory failure with hypoxia: Secondary | ICD-10-CM

## 2020-03-17 DIAGNOSIS — R5381 Other malaise: Secondary | ICD-10-CM

## 2020-03-17 DIAGNOSIS — N184 Chronic kidney disease, stage 4 (severe): Secondary | ICD-10-CM | POA: Diagnosis not present

## 2020-03-17 DIAGNOSIS — Z9911 Dependence on respirator [ventilator] status: Secondary | ICD-10-CM | POA: Diagnosis not present

## 2020-03-17 DIAGNOSIS — J9622 Acute and chronic respiratory failure with hypercapnia: Secondary | ICD-10-CM

## 2020-03-17 LAB — CBC WITH DIFFERENTIAL/PLATELET
Abs Immature Granulocytes: 0.1 10*3/uL — ABNORMAL HIGH (ref 0.00–0.07)
Basophils Absolute: 0.1 10*3/uL (ref 0.0–0.1)
Basophils Relative: 1 %
Eosinophils Absolute: 0.5 10*3/uL (ref 0.0–0.5)
Eosinophils Relative: 4 %
HCT: 23.3 % — ABNORMAL LOW (ref 36.0–46.0)
Hemoglobin: 6.9 g/dL — CL (ref 12.0–15.0)
Immature Granulocytes: 1 %
Lymphocytes Relative: 15 %
Lymphs Abs: 1.7 10*3/uL (ref 0.7–4.0)
MCH: 29.5 pg (ref 26.0–34.0)
MCHC: 29.6 g/dL — ABNORMAL LOW (ref 30.0–36.0)
MCV: 99.6 fL (ref 80.0–100.0)
Monocytes Absolute: 0.8 10*3/uL (ref 0.1–1.0)
Monocytes Relative: 7 %
Neutro Abs: 8.6 10*3/uL — ABNORMAL HIGH (ref 1.7–7.7)
Neutrophils Relative %: 72 %
Platelets: 261 10*3/uL (ref 150–400)
RBC: 2.34 MIL/uL — ABNORMAL LOW (ref 3.87–5.11)
RDW: 15.5 % (ref 11.5–15.5)
WBC: 11.7 10*3/uL — ABNORMAL HIGH (ref 4.0–10.5)
nRBC: 0 % (ref 0.0–0.2)

## 2020-03-17 LAB — URINALYSIS, ROUTINE W REFLEX MICROSCOPIC
Bilirubin Urine: NEGATIVE
Glucose, UA: NEGATIVE mg/dL
Ketones, ur: NEGATIVE mg/dL
Nitrite: NEGATIVE
Protein, ur: NEGATIVE mg/dL
Specific Gravity, Urine: 1.009 (ref 1.005–1.030)
pH: 6 (ref 5.0–8.0)

## 2020-03-17 LAB — COMPREHENSIVE METABOLIC PANEL
ALT: 38 U/L (ref 0–44)
AST: 54 U/L — ABNORMAL HIGH (ref 15–41)
Albumin: 2.2 g/dL — ABNORMAL LOW (ref 3.5–5.0)
Alkaline Phosphatase: 72 U/L (ref 38–126)
Anion gap: 13 (ref 5–15)
BUN: 63 mg/dL — ABNORMAL HIGH (ref 8–23)
CO2: 25 mmol/L (ref 22–32)
Calcium: 8.4 mg/dL — ABNORMAL LOW (ref 8.9–10.3)
Chloride: 109 mmol/L (ref 98–111)
Creatinine, Ser: 2.09 mg/dL — ABNORMAL HIGH (ref 0.44–1.00)
GFR calc Af Amer: 27 mL/min — ABNORMAL LOW (ref >60)
GFR calc non Af Amer: 23 mL/min — ABNORMAL LOW (ref >60)
Glucose, Bld: 179 mg/dL — ABNORMAL HIGH (ref 70–99)
Potassium: 3.4 mmol/L — ABNORMAL LOW (ref 3.5–5.1)
Sodium: 147 mmol/L — ABNORMAL HIGH (ref 135–145)
Total Bilirubin: 1.2 mg/dL (ref 0.3–1.2)
Total Protein: 5.6 g/dL — ABNORMAL LOW (ref 6.5–8.1)

## 2020-03-17 LAB — GLUCOSE, CAPILLARY
Glucose-Capillary: 122 mg/dL — ABNORMAL HIGH (ref 70–99)
Glucose-Capillary: 130 mg/dL — ABNORMAL HIGH (ref 70–99)
Glucose-Capillary: 142 mg/dL — ABNORMAL HIGH (ref 70–99)
Glucose-Capillary: 156 mg/dL — ABNORMAL HIGH (ref 70–99)
Glucose-Capillary: 157 mg/dL — ABNORMAL HIGH (ref 70–99)
Glucose-Capillary: 162 mg/dL — ABNORMAL HIGH (ref 70–99)

## 2020-03-17 LAB — CULTURE, BLOOD (ROUTINE X 2)
Culture: NO GROWTH
Culture: NO GROWTH
Special Requests: ADEQUATE
Special Requests: ADEQUATE

## 2020-03-17 LAB — PREPARE RBC (CROSSMATCH)

## 2020-03-17 MED ORDER — FUROSEMIDE 10 MG/ML IJ SOLN
40.0000 mg | Freq: Once | INTRAMUSCULAR | Status: AC
  Administered 2020-03-17: 40 mg via INTRAVENOUS
  Filled 2020-03-17: qty 4

## 2020-03-17 MED ORDER — HEPARIN SODIUM (PORCINE) 5000 UNIT/ML IJ SOLN
5000.0000 [IU] | Freq: Three times a day (TID) | INTRAMUSCULAR | Status: DC
  Administered 2020-03-17 – 2020-04-01 (×45): 5000 [IU] via SUBCUTANEOUS
  Filled 2020-03-17 (×46): qty 1

## 2020-03-17 MED ORDER — HEPARIN SODIUM (PORCINE) 5000 UNIT/ML IJ SOLN: 5000.0000 [IU] | Freq: Three times a day (TID) | INTRAMUSCULAR | Status: DC

## 2020-03-17 MED ORDER — SODIUM CHLORIDE 0.9% IV SOLUTION: Freq: Once | INTRAVENOUS | Status: DC

## 2020-03-17 MED ORDER — POTASSIUM CHLORIDE 20 MEQ/15ML (10%) PO SOLN
40.0000 meq | Freq: Once | ORAL | Status: AC
  Administered 2020-03-17: 40 meq
  Filled 2020-03-17: qty 30

## 2020-03-17 MED ORDER — MELATONIN 3 MG PO TABS
3.0000 mg | ORAL_TABLET | Freq: Every day | ORAL | Status: DC
  Administered 2020-03-17 – 2020-04-10 (×26): 3 mg
  Filled 2020-03-17 (×26): qty 1

## 2020-03-17 NOTE — Progress Notes (Signed)
[place] Progress Note Patient Name: Kimberly Howell DOB: [birth year] MRN: [id number]   Date of Service  03/17/2020  HPI/Events of Note  Insomnia  eICU Interventions  Melatonin 3mg  QHS per PEG tube.     Intervention Category Minor Interventions: Other:  Charlott Rakes 03/17/2020, 12:29 AM

## 2020-03-17 NOTE — Progress Notes (Signed)
NAME:  Kimberly Howell, MRN:  [id number], DOB:  [birth year], LOS: 49 ADMISSION DATE:  03/03/2020, CONSULTATION DATE:  03/17/20 REFERRING MD:  EDP, CHIEF COMPLAINT:  Cardiac arrest   Brief History   74 year old female with past medical history of chronic respiratory failure with COPD on 3 L home O2 and CPAP nightly, stage IV CKD, hypertension, morbid obesity who had a cardiac arrest with >45 minutes CPR before ROSC then lost pulses three more times.  Intubated and PCCM consulted for admission  Past Medical History  Morbid obesity with BMI of 50.0-59.9, adult ([place])   Chronic respiratory failure (HCC)   CKD (chronic kidney disease) stage 3, GFR 30-59 ml/min ([place])   Essential hypertension   OSA on CPAP  Significant Hospital Events   9/18 Admit to PCCM  Consults:  Ophthamology ENT Neurology Palliative  Procedures:  9/18 ETT >> 9/28 9/21 CVC 3L >> to be taken out 9/29 post PEG 9/27 LP  9/28 Tracheostomy  9/29 IR PEG   Significant Diagnostic Tests:  CT head/C-spine/Maxillofacial>>Small left frontal scalp hematoma without skull fracture. Fracture of the left lamina papyracea with herniation of the left medial rectus muscle into the ethmoid sinus. Small amount of retro bulbar stranding, likely hemorrhage.  Right lung apex consolidation concerning for pneumonia. TTE 9/18 >>LVEF 60-65%; no regional wall abnormalities; G1DD, normal RV EEG 9/20 > diffuse encephalopathy MRI brain 9/24 >> Findings are highly suspicious for hypoxic/ischemic injury. Two punctate acute infarcts within the paramedian right frontal lobe are difficult to exclude. Medially displaced acute fracture of the left lamina papyracea. There is also an acute, displaced fracture of the left fovea ethmoidalis. Orbital fat and the medial rectus muscle extend into the left ethmoid sinus. Additionally, orbital fat extends from the left ethmoid sinus through the fracture defect in the fovea ethmoidalis intracranially into the left  anterior cranial fossa. The intracranial component of orbital fat measures 1 cm and exerts mild local mass effect upon the anteroinferior left frontal lobe.   Micro Data:  9/18 Sars-CoV-2>>negative 9/18 MRSA >> positive 9/27 CSF culture >> negative 9/27 UC >> negative 9/27 BC x 2 >> negative 9/28 BAL >> negative  Antimicrobials:   9/19 Unasyn >> 9/25 9/27 Vancomycin >> 9/27 (1 dose) 9/27 Ceftriaxone >> 9/29 10/2 Cefipime, Vancomycin   Interim history/subjective:   Patient lying alert in bed. Nods and shakes head when she asked questions. Able to smile. Limited conversation due to tracheostomy.   Overnight events: received melatonin  TMAX: 100.2   Objective   Blood pressure (!) 135/58, pulse 74, temperature 98.3 F (36.8 C), resp. rate (!) 24, height _0  (1.727 m), weight 132.4 kg, SpO2 99 %.    Vent Mode: PRVC FiO2 (%):  [40 %] 40 % Set Rate:  [22 bmp] 22 bmp Vt Set:  [380 mL] 380 mL PEEP:  [5 cmH20] 5 cmH20 Pressure Support:  [10 cmH20-15 cmH20] 15 cmH20 Plateau Pressure:  [17 cmH20-22 cmH20] 19 cmH20   Intake/Output Summary (Last 24 hours) at 03/17/2020 6333 Last data filed at 03/17/2020 0600 Gross per 24 hour  Intake 1629.56 ml  Output 300 ml  Net 1329.56 ml   Filed Weights   03/15/20 0418 03/16/20 0500 03/17/20 0600  Weight: 129.3 kg 133.4 kg 132.4 kg   EXAM General: 74 yr old AA obese, female lying in bed, unwell appearing  HEENT: moist  MM, improving scleral hemorrhage in left eye, PERRLA +3, poor dentition, missing front teeth Neuro: Alert, blinking, grimacing, moving mouth and  nods when asked a question, unable to move limbs  CV:S1, S2 present, RRR, no rubs, gallops or murmurs, Pulm: CTAB, trach in place on P/s CPAP w/ FIO2 at 40%, PEEP 5, pressure support 15, bilateral ronchi breath sounds:  GI: generalized tenderness, no guarding, bowel sounds present, PEG tube in place to [facility] Skin: healing abrasion on forehead Extremities: warm/dry, no edema  noted  Labs/Imaging personally reviewed Na: 147 K 3.4 Cr 2.09 Hgb: 6.9 WBC 11.7 Neut 8.6  CXR 9/30: Resolution of subcutaneous emphysema post trach placement, RLL atelectasis, diffuse pulmonary infiltrates/edema and small bilateral pleural effusions present  CXR 10/1: Stable to slightly worsened bibasilar airspace opacities. 2. 3cm right hilar density of unclear etiology.  CT chest for further evaluation   Resolved Problem List:   AKI on CKD- now close to euvolemic and new baseline Cr  Assessment & Plan:   Cardiac arrest with hypoxic and hypercarbic respiratory failure Patient with prolonged downtime  > 45 minutes. Presumed PEA cardiac arrest.  Had 4 separate events with ROSC obtained. EKG without signs of ischemia.  Slow neurological recovery, family [place] 9/27 agreed for [facility] placement.  -Trach (9/28), patient currently tolerating CPAP settings -PEG (9/29), restart TF 9/30, continue 200 mL free water flush to address hypernatremia -CM working towards LTACH placement -Daily BMET, replace electrolytes PRN -Continuous cardiac monitoring  R sided Pneumonia Probable aspiration during cardiac event. S/p unasyn x 7 days. s/p ceftriaxone 9/27, s/p cefazolin, f/u BAL and culture data but suspect noninfectious. Tmax 100.4 on 10/1. Overnight T100.2. started on Cefipime and Vancomycin (10/1-) -Follow up respiratory cultures -Encourage pulmonary hygiene -Monitor fever curve -Follow up UA  Periorbital fracture with hyphema  Herniation of the left medial rectus muscle into the sinus with small amount of retro bulbar stranding.  Ophthalmologist noted patient's EOM intact and no limitations of gaze & no signs of entrapment seen.  Therefore ENT recommends to not repair orbital injury at this time. Neurosurgery consulted 9/28, stating no indication for repair unless clear CSF leak which the patient has no symptoms of. - Neuro Assessments Q4H while in ICU - monitor for signs of CSF leak due -  f/u with Ophthalmology outpatient for risk of enopthalmus  Anemia 2/2 chronic disease, critical illness Hb 6.9 today, Hb 7.9 on 10/1. No evidence of acute bleeding today on exam -Type and screen -Transfuse 1 uRBC -Monitor H&H  Type 2 diabetes CBGs 130, 156 -SSI with CBG's  HFpEF Echo Grade 1 diastolic dysfunction -Strict I/O  Hypertension SBP 100-140s Home medications: Amlodipine, carvedilol, furosemide, lisinopril on hold s/p pressor requirement this admission.  Patient is currently euvolemic, EF 60-65%,  -Strict I/O -Daily BMET  AKI, improving  Cr 2 today, 2.1 on 10/1 -Monitor with BMP   Constipation Loose BMs per RN today -Dc Sorbitol -Continue Miralax & Colace PRN  DVT ppx -Continue Heparin 5000 units TID despite drop in Hb as pt is high risk of thrombotic events  Hypernatremia-improving  Na today 147, 149 on 10/1 -Continue with PEG flush free water, 300cc every 4 hours -Continue to monitor -Avoid high sodium containing fluids   Best practice:  Diet: NPO with TFs  Pain/Anxiety/Delirium protocol (if indicated): Fentanyl IVP PRN VAP protocol (if indicated): in place: HOB 30 degrees, suction as needed DVT prophylaxis: Heparin SQ/ SCDs GI prophylaxis: Protonix Glucose control: SSI & levemir BID Mobility: Bedrest, mobilize as tolerated Code Status: DNR Family Communication:  Disposition: ICU  Critical care time:      Lattie Haw MD  Please see Amion for pager details.

## 2020-03-17 NOTE — TOC Progression Note (Signed)
Transition of Care [facility]) - Progression Note    Patient Details  Name: Kimberly Howell MRN: [id number] Date of Birth: 15-Sep-1945  Transition of Care [facility]) CM/SW [place], [place] Phone Number: 03/17/2020, 11:50 AM  Clinical Narrative:    CSW contacted [place] to seek possible placement for pt.  [place] does not have admission coordinators on the weekend.  Elyse Hsu is the admission coordinator [phone] ext [phone].   Expected Discharge Plan: Skilled Nursing Facility Barriers to Discharge: Continued Medical Work up  Expected Discharge Plan and Services Expected Discharge Plan: [place]   Discharge Planning Services: CM Consult Post Acute Care Choice: Long Term Acute Care (LTAC) Living arrangements for the past 2 months: Single Family Home                                       Social Determinants of Health (SDOH) Interventions    Readmission Risk Interventions Readmission Risk Prevention Plan 03/14/2020  Transportation Screening Complete  PCP or Specialist Appt within 5-7 Days Complete  Home Care Screening Complete  Medication Review (RN CM) Complete

## 2020-03-18 DIAGNOSIS — Z930 Tracheostomy status: Secondary | ICD-10-CM | POA: Diagnosis not present

## 2020-03-18 DIAGNOSIS — R5381 Other malaise: Secondary | ICD-10-CM | POA: Diagnosis not present

## 2020-03-18 DIAGNOSIS — J9601 Acute respiratory failure with hypoxia: Secondary | ICD-10-CM | POA: Diagnosis not present

## 2020-03-18 DIAGNOSIS — Z9911 Dependence on respirator [ventilator] status: Secondary | ICD-10-CM | POA: Diagnosis not present

## 2020-03-18 LAB — GLUCOSE, CAPILLARY
Glucose-Capillary: 117 mg/dL — ABNORMAL HIGH (ref 70–99)
Glucose-Capillary: 119 mg/dL — ABNORMAL HIGH (ref 70–99)
Glucose-Capillary: 141 mg/dL — ABNORMAL HIGH (ref 70–99)
Glucose-Capillary: 143 mg/dL — ABNORMAL HIGH (ref 70–99)
Glucose-Capillary: 152 mg/dL — ABNORMAL HIGH (ref 70–99)
Glucose-Capillary: 155 mg/dL — ABNORMAL HIGH (ref 70–99)
Glucose-Capillary: 172 mg/dL — ABNORMAL HIGH (ref 70–99)

## 2020-03-18 LAB — BASIC METABOLIC PANEL
Anion gap: 11 (ref 5–15)
BUN: 54 mg/dL — ABNORMAL HIGH (ref 8–23)
CO2: 24 mmol/L (ref 22–32)
Calcium: 8.9 mg/dL (ref 8.9–10.3)
Chloride: 109 mmol/L (ref 98–111)
Creatinine, Ser: 1.83 mg/dL — ABNORMAL HIGH (ref 0.44–1.00)
GFR calc Af Amer: 32 mL/min — ABNORMAL LOW (ref >60)
GFR calc non Af Amer: 27 mL/min — ABNORMAL LOW (ref >60)
Glucose, Bld: 138 mg/dL — ABNORMAL HIGH (ref 70–99)
Potassium: 4.1 mmol/L (ref 3.5–5.1)
Sodium: 144 mmol/L (ref 135–145)

## 2020-03-18 LAB — BPAM RBC
Blood Product Expiration Date: 202110262359
ISSUE DATE / TIME: 202110021102
Unit Type and Rh: 7300

## 2020-03-18 LAB — TYPE AND SCREEN
ABO/RH(D): B POS
Antibody Screen: NEGATIVE
Unit division: 0

## 2020-03-18 LAB — CBC
HCT: 30.6 % — ABNORMAL LOW (ref 36.0–46.0)
Hemoglobin: 9.2 g/dL — ABNORMAL LOW (ref 12.0–15.0)
MCH: 29.3 pg (ref 26.0–34.0)
MCHC: 30.1 g/dL (ref 30.0–36.0)
MCV: 97.5 fL (ref 80.0–100.0)
Platelets: 291 10*3/uL (ref 150–400)
RBC: 3.14 MIL/uL — ABNORMAL LOW (ref 3.87–5.11)
RDW: 15.2 % (ref 11.5–15.5)
WBC: 13 10*3/uL — ABNORMAL HIGH (ref 4.0–10.5)
nRBC: 0 % (ref 0.0–0.2)

## 2020-03-18 MED ORDER — INFLUENZA VAC A&B SA ADJ QUAD 0.5 ML IM PRSY
0.5000 mL | PREFILLED_SYRINGE | INTRAMUSCULAR | Status: DC
  Filled 2020-03-18: qty 0.5

## 2020-03-18 MED ORDER — PNEUMOCOCCAL VAC POLYVALENT 25 MCG/0.5ML IJ INJ
0.5000 mL | INJECTION | INTRAMUSCULAR | Status: DC
  Filled 2020-03-18: qty 0.5

## 2020-03-18 NOTE — Progress Notes (Signed)
Pharmacy Antibiotic Note  Kimberly Howell is a 74 y.o. female admitted on 03/03/2020 with aspiration PNA s/p a course of antibiotics.  Pharmacy now consulted to start vancomycin and cefepime for sepsis.  AKI continues to improve with scr down to 1.8 today, Tmax 99.3, WBC up to 13, PCT was 0.5.  Trach aspirate collected and sent 10/2. No other growth in cultures.   Plan: Vanc  1550m IV Q24H Cefepime 2gm IV Q12H Monitor renal fxn, clinical progress, vanc trough at Css  Height: _0  (172.7 cm) Weight: 131.6 kg (290 lb 2 oz) IBW/kg (Calculated) : 63.9  Temp (24hrs), Avg:98.8 F (37.1 C), Min:98.5 F (36.9 C), Max:99.3 F (37.4 C)  Recent Labs  Lab 03/14/20 1027 03/15/20 0228 03/16/20 1140 03/17/20 0103 03/18/20 0004  WBC 10.5 10.3 12.1* 11.7* 13.0*  CREATININE 2.71* 2.71* 2.16* 2.09* 1.83*    Estimated Creatinine Clearance: 40.5 mL/min (A) (by C-G formula based on SCr of 1.83 mg/dL (H)).    Allergies  Allergen Reactions   Prednisone Other (See Comments)    Increases blood sugar    Unasyn 9/19 > 9/26 Vanc x1 9/27, restarted 10/1 >> Cefepime 10/1 >> CTX 9/27 >> 9/29  9/18 MRSA PCR - positive 9/18 COVID- neg 9/27 UCx - negative 9/27 BCx - NG 9/27 CSF - negative 9/28 BAL - negative 10/2 TA - sent  FErin HearingPharmD., BCPS Clinical Pharmacist 03/18/2020 11:17 AM

## 2020-03-18 NOTE — Progress Notes (Addendum)
NAME:  Kimberly Howell, MRN:  [id number], DOB:  Jul 22, 1945, LOS: 25 ADMISSION DATE:  03/03/2020, CONSULTATION DATE:  03/18/20 REFERRING MD:  EDP, CHIEF COMPLAINT:  Cardiac arrest   Brief History   74 year old female with PMH chronic respiratory failure with COPD on 3 L home O2 and CPAP nightly, stage IV CKD, hypertension, morbid obesity who had a cardiac arrest with >45 minutes CPR before ROSC then lost pulses three more times.  Intubated and PCCM consulted for admission  Past Medical History  Morbid obesity with BMI of 50.0-59.9, adult ([place]) Chronic respiratory failure (HCC) CKD (chronic kidney disease) stage 3, GFR 30-59 ml/min (HCC) Essential hypertension OSA on CPAP  Significant Hospital Events   9/18 Admit to PCCM 9/28 Trach   Consults:  Ophthamology ENT Neurology Palliative  Procedures:  9/18 ETT >> 9/28 9/21 CVC 3L >> to be taken out 9/29 post PEG 9/27 LP  9/28 Tracheostomy  9/29 IR PEG   Significant Diagnostic Tests:  CT head/C-spine/Maxillofacial >> Small left frontal scalp hematoma without skull fracture. Fracture of the left lamina papyracea with herniation of the left medial rectus muscle into the ethmoid sinus. Small amount of retro bulbar stranding, likely hemorrhage.  Right lung apex consolidation concerning for pneumonia. TTE 9/18 >> LVEF 60-65%; no regional wall abnormalities; G1DD, normal RV EEG 9/20 >> diffuse encephalopathy MRI brain 9/24 >> Findings are highly suspicious for hypoxic/ischemic injury. Two punctate acute infarcts within the paramedian right frontal lobe are difficult to exclude. Medially displaced acute fracture of the left lamina papyracea. There is also an acute, displaced fracture of the left fovea ethmoidalis. Orbital fat and the medial rectus muscle extend into the left ethmoid sinus. Additionally, orbital fat extends from the left ethmoid sinus through the fracture defect in the fovea ethmoidalis intracranially into the left anterior  cranial fossa. The intracranial component of orbital fat measures 1 cm and exerts mild local mass effect upon the anteroinferior left frontal lobe.   Micro Data:  9/18 Sars-CoV-2>>negative 9/18 MRSA >> positive 9/27 CSF culture >> negative 9/27 UC >> negative 9/27 BC x 2 >> negative 9/28 BAL >> negative 10/2 Tracheal aspirate >>  10/2 UA >> negative   Antimicrobials:  Unasyn 9/19 >> 9/25 Vancomycin >> 9/27 (1 dose) Ceftriaxone 9/27 >> 9/29 Cefepime 10/2 >>  Vanco 10/2 >>   Interim history/subjective:  Tmax 99 / WBC 13 On vent 35%, PEEP 5 Glucose range 117-155  I/O UOP 1L, +1L in last 24 hours   Objective   Blood pressure (!) 125/56, pulse 73, temperature 99 F (37.2 C), resp. rate (!) 27, height _0  (1.727 m), weight 131.6 kg, SpO2 97 %.    Vent Mode: PSV;CPAP FiO2 (%):  [35 %] 35 % Set Rate:  [22 bmp] 22 bmp Vt Set:  [380 mL] 380 mL PEEP:  [5 cmH20] 5 cmH20 Pressure Support:  [10 cmH20-15 cmH20] 10 cmH20 Plateau Pressure:  [20 cmH20-27 cmH20] 27 cmH20   Intake/Output Summary (Last 24 hours) at 03/18/2020 1059 Last data filed at 03/18/2020 0900 Gross per 24 hour  Intake 1825 ml  Output 1070 ml  Net 755 ml   Filed Weights   03/16/20 0500 03/17/20 0600 03/18/20 0454  Weight: 133.4 kg 132.4 kg 131.6 kg   EXAM General: pleasant, elderly female lying in bed on vent in NAD HEENT: MM pink/moist, trach midline c/d/i, anicteric, soft tissue swelling of left forehead with small area dried blood Neuro: Awake, alert, makes eye contact, attempts to mouth words  CV: s1s2 rrr, no m/r/g PULM: non-labored on PSV wean, rhonchi bilaterally, suctioned with thin clear secretions removed, slight improvement in breath sounds post suctioning  GI: soft, bsx4 active  Extremities: warm/dry, 1+ dependent edema  Skin: no rashes or lesions   Resolved Problem List:  AKI on CKD- now close to euvolemic and new baseline Cr  Assessment & Plan:   Cardiac Arrest with Hypoxic and Hypercarbic  Respiratory Failure Patient with prolonged downtime  > 45 minutes. Presumed PEA cardiac arrest.  Had 4 separate events with ROSC obtained. EKG without signs of ischemia.  Slow neurological recovery, family [place] 9/27 agreed for [facility] placement. Trach on 9/28.  -PRVC 8cc/kg as rest mode  -daily SBT as tolerated  -follow intermittent CXR  -Care management working toward [facility] placement  -PT efforts -tele monitoring    R sided Pneumonia Probable aspiration during cardiac event.  S/p unasyn x 7 days. s/p ceftriaxone 9/27, s/p cefazolin, f/u BAL and culture data but suspect noninfectious. ABX restarted 10/1 for fever.  -follow tracheal aspirate, no growth to date -fever curve / WBC improved -abx as above   Periorbital Fracture with Hyphema  Herniation of the left medial rectus muscle into the sinus with small amount of retro bulbar stranding.  Ophthalmologist noted patient's EOM intact and no limitations of gaze & no signs of entrapment seen.  Therefore ENT recommends to not repair orbital injury at this time. Neurosurgery consulted 9/28, stating no indication for repair unless clear CSF leak which the patient has no symptoms of. -follow neuro exam  -follow up with outpatient Ophthalmology   Anemia 2/2 chronic disease, critical illness Transfused 10/2 1 unit PRBC -trend CBC  -follow H/H  -transfuse for Hgb <7%  Type 2 diabetes -SSI  -glucose goal 140-180  HFpEF Echo Grade 1 diastolic dysfunction -follow I/O's   Hypertension SBP 100-140s Home medications: Amlodipine, carvedilol, furosemide, lisinopril on hold s/p pressor requirement this admission. EF 60-65% -Strict I/O -Daily BMET -consider restarting amlodipine 10/4 pending BP trend review -hold all home antihypertensives   AKI  Improving trend  -Trend BMP / urinary output -Replace electrolytes as indicated -Avoid nephrotoxic agents, ensure adequate renal perfusion  Hypernatremia  -continue free water, 300 ml Q4  -follow  Na+ trend   Constipation -PRN miralax, colace  At Risk Malnutrition  PEG Status  PEG (9/29) -TF per Nutrition   Best practice:  Diet: NPO, TF  Pain/Anxiety/Delirium protocol (if indicated): Fentanyl IVP PRN VAP protocol (if indicated): in place: HOB 30 degrees, suction as needed DVT prophylaxis: Heparin SQ/ SCDs GI prophylaxis: Protonix Glucose control: SSI & levemir BID Mobility: Bedrest, mobilize as tolerated Code Status: DNR Family Communication: Niece updated at bedside 10/3 on plan of care.  Disposition: ICU  Critical care time:      Noe Gens, MSN, NP-C, AGACNP-BC [facility] 03/18/2020, 11:00 AM   Please see [email or web address] for pager details.

## 2020-03-19 ENCOUNTER — Inpatient Hospital Stay (HOSPITAL_COMMUNITY): Payer: Medicare HMO

## 2020-03-19 DIAGNOSIS — G253 Myoclonus: Secondary | ICD-10-CM

## 2020-03-19 DIAGNOSIS — D649 Anemia, unspecified: Secondary | ICD-10-CM | POA: Diagnosis not present

## 2020-03-19 DIAGNOSIS — Z9911 Dependence on respirator [ventilator] status: Secondary | ICD-10-CM | POA: Diagnosis not present

## 2020-03-19 DIAGNOSIS — J9601 Acute respiratory failure with hypoxia: Secondary | ICD-10-CM | POA: Diagnosis not present

## 2020-03-19 DIAGNOSIS — R569 Unspecified convulsions: Secondary | ICD-10-CM

## 2020-03-19 DIAGNOSIS — I469 Cardiac arrest, cause unspecified: Secondary | ICD-10-CM | POA: Diagnosis not present

## 2020-03-19 LAB — BASIC METABOLIC PANEL
Anion gap: 9 (ref 5–15)
BUN: 55 mg/dL — ABNORMAL HIGH (ref 8–23)
CO2: 25 mmol/L (ref 22–32)
Calcium: 8.8 mg/dL — ABNORMAL LOW (ref 8.9–10.3)
Chloride: 108 mmol/L (ref 98–111)
Creatinine, Ser: 1.82 mg/dL — ABNORMAL HIGH (ref 0.44–1.00)
GFR calc Af Amer: 32 mL/min — ABNORMAL LOW (ref >60)
GFR calc non Af Amer: 27 mL/min — ABNORMAL LOW (ref >60)
Glucose, Bld: 163 mg/dL — ABNORMAL HIGH (ref 70–99)
Potassium: 4.2 mmol/L (ref 3.5–5.1)
Sodium: 142 mmol/L (ref 135–145)

## 2020-03-19 LAB — CBC
HCT: 28.3 % — ABNORMAL LOW (ref 36.0–46.0)
Hemoglobin: 8.6 g/dL — ABNORMAL LOW (ref 12.0–15.0)
MCH: 29.5 pg (ref 26.0–34.0)
MCHC: 30.4 g/dL (ref 30.0–36.0)
MCV: 96.9 fL (ref 80.0–100.0)
Platelets: 276 10*3/uL (ref 150–400)
RBC: 2.92 MIL/uL — ABNORMAL LOW (ref 3.87–5.11)
RDW: 15.4 % (ref 11.5–15.5)
WBC: 12 10*3/uL — ABNORMAL HIGH (ref 4.0–10.5)
nRBC: 0 % (ref 0.0–0.2)

## 2020-03-19 LAB — POCT I-STAT 7, (LYTES, BLD GAS, ICA,H+H)
Acid-base deficit: 2 mmol/L (ref 0.0–2.0)
Bicarbonate: 22.6 mmol/L (ref 20.0–28.0)
Calcium, Ion: 1.26 mmol/L (ref 1.15–1.40)
HCT: 29 % — ABNORMAL LOW (ref 36.0–46.0)
Hemoglobin: 9.9 g/dL — ABNORMAL LOW (ref 12.0–15.0)
O2 Saturation: 97 %
Patient temperature: 99.3
Potassium: 4.3 mmol/L (ref 3.5–5.1)
Sodium: 141 mmol/L (ref 135–145)
TCO2: 24 mmol/L (ref 22–32)
pCO2 arterial: 36.7 mmHg (ref 32.0–48.0)
pH, Arterial: 7.399 (ref 7.350–7.450)
pO2, Arterial: 92 mmHg (ref 83.0–108.0)

## 2020-03-19 LAB — GLUCOSE, CAPILLARY
Glucose-Capillary: 177 mg/dL — ABNORMAL HIGH (ref 70–99)
Glucose-Capillary: 159 mg/dL — ABNORMAL HIGH (ref 70–99)
Glucose-Capillary: 156 mg/dL — ABNORMAL HIGH (ref 70–99)
Glucose-Capillary: 195 mg/dL — ABNORMAL HIGH (ref 70–99)
Glucose-Capillary: 169 mg/dL — ABNORMAL HIGH (ref 70–99)

## 2020-03-19 LAB — MAGNESIUM: Magnesium: 2.1 mg/dL (ref 1.7–2.4)

## 2020-03-19 MED ORDER — VALPROIC ACID 250 MG/5ML PO SOLN
5.0000 mg/kg | Freq: Three times a day (TID) | ORAL | Status: DC
  Administered 2020-03-20 – 2020-03-21 (×4): 660 mg via ORAL
  Filled 2020-03-19 (×4): qty 15

## 2020-03-19 MED ORDER — SODIUM CHLORIDE 0.9 % IV SOLN
2.0000 g | INTRAVENOUS | Status: DC
  Administered 2020-03-19 – 2020-03-25 (×7): 2 g via INTRAVENOUS
  Filled 2020-03-19: qty 2
  Filled 2020-03-19 (×2): qty 20
  Filled 2020-03-19 (×4): qty 2
  Filled 2020-03-19: qty 20

## 2020-03-19 MED ORDER — FREE WATER
200.0000 mL | Status: DC
  Administered 2020-03-19 – 2020-03-28 (×53): 200 mL

## 2020-03-19 MED ORDER — FUROSEMIDE 10 MG/ML IJ SOLN
40.0000 mg | Freq: Every day | INTRAMUSCULAR | Status: DC
  Administered 2020-03-19 – 2020-03-22 (×4): 40 mg via INTRAVENOUS
  Filled 2020-03-19 (×4): qty 4

## 2020-03-19 MED ORDER — VALPROATE SODIUM 500 MG/5ML IV SOLN
2500.0000 mg | Freq: Once | INTRAVENOUS | Status: AC
  Administered 2020-03-19: 2500 mg via INTRAVENOUS
  Filled 2020-03-19: qty 25

## 2020-03-19 MED FILL — Vancomycin HCl For IV Soln 10 GM (Base Equivalent): INTRAVENOUS | Qty: 1500 | Status: AC

## 2020-03-19 NOTE — Procedures (Addendum)
Patient Name: Kimberly Howell  MRN: [id number]  Epilepsy Attending: Lora Havens  Referring Physician/Provider: Dr Molli Hazard Date: 03/19/2020 Duration: 31.31 mins  Patient history: 73y m s/p cardiac arrest with >45 min before ROSC. EEG to evaluate for seizure.  Level of alertness: awake  AEDs during EEG study: None  Technical aspects: This EEG study was done with scalp electrodes positioned according to the 10-20 International system of electrode placement. Electrical activity was acquired at a sampling rate of 500Hz  and reviewed with a high frequency filter of 70Hz  and a low frequency filter of 1Hz . EEG data were recorded continuously and digitally stored.   Description: No posterior dominant rhythm was seen. EEG showed continuous generalized low amplitude 3 to 6 Hz theta-delta slowing. Frequent episodes of facial twitching were noted. Concomitant eeg before, during and after the events didn't show eeg change to suggest seizure.  Hyperventilation and photic stimulation were not performed.     ABNORMALITY - Continuous slow, generalized   IMPRESSION: This study is suggestive of moderate diffuse encephalopathy, nonspecific etiology. No definite seizures or epileptiform discharges were seen throughout the recording.  Patient was noted to have episodes of facial twitching without concomitant eeg change. However focal motor seizures may not be seen on scalp eeg. Therefore, clinical correlation is recommended. Myoclonus is also in the differential.  Dr Cheral Marker was notified.   Weiland Tomich Barbra Sarks

## 2020-03-19 NOTE — Progress Notes (Addendum)
NAME:  Kimberly Howell, MRN:  [id number], DOB:  [birth year], LOS: 25 ADMISSION DATE:  03/03/2020, CONSULTATION DATE:  03/19/20 REFERRING MD:  EDP, CHIEF COMPLAINT:  Cardiac arrest   Brief History   74 year old female with PMH chronic respiratory failure with COPD on 3 L home O2 and CPAP nightly, stage IV CKD, hypertension, morbid obesity who had a cardiac arrest with >45 minutes CPR before ROSC then lost pulses three more times.  Intubated and PCCM consulted for admission  Past Medical History  Morbid obesity with BMI of 50.0-59.9, adult ([place]) Chronic respiratory failure (HCC) CKD (chronic kidney disease) stage 3, GFR 30-59 ml/min (HCC) Essential hypertension OSA on CPAP  Significant Hospital Events   9/18 Admit to PCCM 9/28 Trach   Consults:  Ophthamology ENT Neurology Palliative  Procedures:  9/18 ETT >> 9/28 9/21 CVC 3L >> to be taken out 9/29 post PEG 9/27 LP  9/28 Tracheostomy  9/29 IR PEG   Significant Diagnostic Tests:  CT head/C-spine/Maxillofacial >> Small left frontal scalp hematoma without skull fracture. Fracture of the left lamina papyracea with herniation of the left medial rectus muscle into the ethmoid sinus. Small amount of retro bulbar stranding, likely hemorrhage.  Right lung apex consolidation concerning for pneumonia. TTE 9/18 >> LVEF 60-65%; no regional wall abnormalities; G1DD, normal RV EEG 9/20 >> diffuse encephalopathy MRI brain 9/24 >> Findings are highly suspicious for hypoxic/ischemic injury. Two punctate acute infarcts within the paramedian right frontal lobe are difficult to exclude. Medially displaced acute fracture of the left lamina papyracea. There is also an acute, displaced fracture of the left fovea ethmoidalis. Orbital fat and the medial rectus muscle extend into the left ethmoid sinus. Additionally, orbital fat extends from the left ethmoid sinus through the fracture defect in the fovea ethmoidalis intracranially into the left anterior  cranial fossa. The intracranial component of orbital fat measures 1 cm and exerts mild local mass effect upon the anteroinferior left frontal lobe.   Micro Data:  9/18 Sars-CoV-2>>negative 9/18 MRSA >> positive 9/27 CSF culture >> negative 9/27 UC >> negative 9/27 BC x 2 >> negative 9/28 BAL >> negative 10/2 Tracheal aspirate >>  10/2 UA >> negative   Antimicrobials:  Unasyn 9/19 >> 9/25 Vancomycin >> 9/27 (1 dose) Ceftriaxone 9/27 >> 9/29 Cefepime 10/2 >>  Vanco 10/2 >>10  Interim history/subjective:  Tmax 99 / WBC 12 On vent 35%, PEEP 5  CBGS 143, 172 I/O UOP 1.15L  Objective   Blood pressure 133/69, pulse 89, temperature 99.3 F (37.4 C), temperature source Axillary, resp. rate (!) 28, height _0  (1.727 m), weight 132 kg, SpO2 98 %.    Vent Mode: PSV;CPAP FiO2 (%):  [30 %-35 %] 35 % Set Rate:  [22 bmp] 22 bmp Vt Set:  [380 mL] 380 mL PEEP:  [5 cmH20] 5 cmH20 Pressure Support:  [10 cmH20] 10 cmH20 Plateau Pressure:  [17 cmH20] 17 cmH20   Intake/Output Summary (Last 24 hours) at 03/19/2020 1740 Last data filed at 03/19/2020 0919 Gross per 24 hour  Intake 2659.31 ml  Output 1150 ml  Net 1509.31 ml   Filed Weights   03/17/20 0600 03/18/20 0454 03/19/20 0500  Weight: 132.4 kg 131.6 kg 132 kg   EXAM General: unwell appearing 74 yr old female lying in bed, alert HEENT: no scleral icterus, hematoma over left forehead with dried blood, midline tracheostomy site dry and clean  Neuro: awake and alert, left sided facial twitching, not responsive to words, positive corneal, gag and  cough reflex, does not withdraw to pain in UE, withdraws to pain in LE CV: s1, s2, RRR, no murmurs PULM: no respiratory distress, few crackles bilaterally GI: soft, non distended, bowel sounds present  Extremities: mild peripheral edema Skin: no rashes, warm and dry   Resolved Problem List:  AKI on CKD- now close to euvolemic and new baseline Cr Hypernatremia   Assessment & Plan:    Cardiac Arrest with Hypoxic and Hypercarbic Respiratory Failure Patient with prolonged downtime  > 45 minutes. Presumed PEA cardiac arrest. Had 4 separate events with ROSC obtained. EKG without signs of ischemia.  Slow neurological recovery, family [place] 9/27 agreed for [facility] placement. Trach on 9/28.  -PRVC 8cc/kg as rest mode  -Daily SBT as tolerated  -Follow CXRs -Care management working toward [facility] placement  -PT efforts  R sided Pneumonia Probable aspiration during cardiac event.  S/p unasyn x 7 days. s/p ceftriaxone 9/27, s/p cefazolin, f/u BAL and culture data but suspect noninfectious. Cefepime (10/1-) and Vancomycin (10/1-) for fever. Tracheal aspirate 10/02: no growth. WBC 12, no fevers overnight  -F/u tracheal aspirate -Follow fever curve  -Monitor WBC -Continue Cefepime and Vancomycin   Periorbital Fracture with Hyphema  Herniation of the left medial rectus muscle into the sinus with small amount of retro bulbar stranding.  Ophthalmologist noted patient's EOM intact and no limitations of gaze & no signs of entrapment seen.  Therefore ENT recommends to not repair orbital injury at this time. Neurosurgery consulted 9/28, stating no indication for repair unless clear CSF leak which the patient has no symptoms of. -follow neuro exam  -follow up with outpatient Ophthalmology   Anemia 2/2 chronic disease, critical illness Hb 8.6 today, s/p 2 unit RBC on 9/25 and 10/2  -Monitor Hb with CBC   -Transfuse for Hb <7%  Type 2 diabetes CBGs 143, 172, has received 2-3 units  -Continue sliding scale insulin  -CBG goal 140-180  HFpEF Echo Grade 1 diastolic dysfunction -Strict I/O's  -Consider restarting home lasix dose 85m   Hypertension SBP 100-145 Home medications: Amlodipine, carvedilol, furosemide, lisinopril on hold s/p pressor requirement this admission. EF 60-65% -Strict I/O -Daily BMP -Hold all home antihypertensives   AKI, improving  Cr 1.82,  -Trend BMP / urinary  output -Replace electrolytes as indicated -Avoid nephrotoxic agents, ensure adequate renal perfusion  Hypernatremia, resolved Na 142  -Continue free water via PEF, reduced to 100-2070mQ4  -Monitor with BMP   Constipation -PRN miralax, colace  At Risk Malnutrition  PEG Status  PEG (9/29) -TF per Nutrition   Best practice:  Diet: NPO, TF  Pain/Anxiety/Delirium protocol (if indicated): Fentanyl IVP PRN VAP protocol (if indicated): in place: HOB 30 degrees, suction as needed DVT prophylaxis: Heparin SQ/ SCDs GI prophylaxis: Protonix Glucose control: SSI & levemir BID Mobility: Bedrest, mobilize as tolerated Code Status: DNR Family Communication: Niece updated at bedside 10/3 on plan of care.  Disposition: ICU  Critical care time:      PoLattie HawD, PGY2 [facility] 03/19/2020, 9:23 AM   Please see [email or web address] for pager details.

## 2020-03-19 NOTE — Progress Notes (Signed)
Paged Dr Cheral Marker, Neurology regarding consult for patient's worsening facial, torso and limb twitching. Neuro will see the patient.  Appreciate recommendations.  Lattie Haw MD

## 2020-03-19 NOTE — Progress Notes (Addendum)
Subjective: Neurology called back to evaluate for intermittent but progressively worsening facial, torso and upper extremity twitching.  Brief review of history: 74yo female with TIIDM, CKD, morbid obesity, COPD and chronic hypoxic respiratory failure on supplemental O2 admitted after collapsing at home, found by her son shortly afterward who started CPR as she was not breathing. Underwent >45 minutes CPR with EMS before ROSC. Pulses were lost three times more with subsequent ROSC. CT on admission showed a left frontal scalp hematoma with left lamina papyracea fracture and herniation of the left medical rectus and retrobulbar stranding. Without CSF leak, no surgical intervention was felt to be necessary. She was treated for aspiration pneumonia. Tracheostomy was placed 9/28 and PEG 9/29.   EEG on 9/20 revealed continuous generalized slowing significant for diffuse encephalopathy. No seizure activity.   MRI on 9/24 showed symmetric DWI abnormality suggestive of anoxic brain injury within the medial temporal lobes and cerebellum with possible punctate acute infarct of paramedian right frontal lobe.   With weaning of sedation she was noted to only intermittently follow commands and neurology was consulted on 9/25 with concern for hypoxic brain injury on MRI. At that time she was unable to track or follow commands but did grimace to noxious stimuli with other brainstem reflexes intact. Supportive care was recommended with possible but guarded prognosis for improvement.   Since that time, patient has had worsening facial, torso, and limb twitching. She has only received intermittent sedating medications, once per day for the last several days, last received yesterday.  On visiting her room the patient opens eyes to voice and appears to possibly be trying to speak but she is not following commands    Objective: Current vital signs: BP (!) 143/73   Pulse 91   Temp 99.4 F (37.4 C)   Resp (!) 27   Ht  _0  (1.727 m)   Wt 132 kg   SpO2 97%   BMI 44.25 kg/m  Vital signs in last 24 hours: Temp:  [98.7 F (37.1 C)-99.4 F (37.4 C)] 99.4 F (37.4 C) (10/04 1100) Pulse Rate:  [64-96] 91 (10/04 1800) Resp:  [22-30] 27 (10/04 1800) BP: (101-155)/(55-87) 143/73 (10/04 1800) SpO2:  [93 %-100 %] 97 % (10/04 1800) FiO2 (%):  [30 %-35 %] 35 % (10/04 1519) Weight:  [132 kg] 132 kg (10/04 0500)  Intake/Output from previous day: 10/03 0701 - 10/04 0700 In: 2840.1 [I.V.:300; NG/GT:2280; IV Piggyback:200.1] Out: 1175 [Urine:1025; Stool:150] Intake/Output this shift: Total I/O In: 590.9 [I.V.:10; NG/GT:480; IV Piggyback:100.9] Out: 205 [Urine:205] Nutritional status:  Diet Order    None      Neurologic Exam: Mentation: Non-verbal, opens eyes to voice but does not consistently track. She is not following any commands, appears to possibly be trying to talk CN:  II, III, IV, VI: unable to track, limited ocular ROM, right pupil reactive, unable to open left eye, closes eyes to any physical touch near the eyes, reacts to sudden movement V, VII: unable to assess light touch sensation, she is moving her mouth  VIII: flinches to noise on the right  She is intermittently breathing over the ventilator Cough and gag reflexes intact.  Motor: Diffuse facial twitching, worsens with verbal or physical stimuli. Occasional full body twitching.  UE: no movement with painful stimuli but resistant to elbow extension bilaterally with facial grimacing.  Withdraws LE to pain. No movement UE, resistant to elbow extension bilaterally. Sensation: Withdraws LE to pain b/l.  Reflexes: 3+ brachioradialis reflexes bilaterally. Right  patellar reflex 2+, unable to obtain left patellar.    Lab Results: Results for orders placed or performed during the hospital encounter of 03/03/20 (from the past 48 hour(s))  Glucose, capillary     Status: Abnormal   Collection Time: 03/17/20  8:10 PM  Result Value Ref Range    Glucose-Capillary 142 (H) 70 - 99 mg/dL    Comment: Glucose reference range applies only to samples taken after fasting for at least 8 hours.  CBC     Status: Abnormal   Collection Time: 03/18/20 12:04 AM  Result Value Ref Range   WBC 13.0 (H) 4.0 - 10.5 K/uL   RBC 3.14 (L) 3.87 - 5.11 MIL/uL   Hemoglobin 9.2 (L) 12.0 - 15.0 g/dL    Comment: REPEATED TO VERIFY POST TRANSFUSION SPECIMEN    HCT 30.6 (L) 36 - 46 %   MCV 97.5 80.0 - 100.0 fL   MCH 29.3 26.0 - 34.0 pg   MCHC 30.1 30.0 - 36.0 g/dL   RDW 15.2 11.5 - 15.5 %   Platelets 291 150 - 400 K/uL   nRBC 0.0 0.0 - 0.2 %    Comment: Performed at [facility], [place] [address] [place], [place] [address]  Basic metabolic panel     Status: Abnormal   Collection Time: 03/18/20 12:04 AM  Result Value Ref Range   Sodium 144 135 - 145 mmol/L   Potassium 4.1 3.5 - 5.1 mmol/L   Chloride 109 98 - 111 mmol/L   CO2 24 22 - 32 mmol/L   Glucose, Bld 138 (H) 70 - 99 mg/dL    Comment: Glucose reference range applies only to samples taken after fasting for at least 8 hours.   BUN 54 (H) 8 - 23 mg/dL   Creatinine, Ser 1.83 (H) 0.44 - 1.00 mg/dL   Calcium 8.9 8.9 - 10.3 mg/dL   GFR calc non Af Amer 27 (L) >60 mL/min   GFR calc Af Amer 32 (L) >60 mL/min   Anion gap 11 5 - 15    Comment: Performed at [facility] [address] [place], [place] [address]  Glucose, capillary     Status: Abnormal   Collection Time: 03/18/20 12:05 AM  Result Value Ref Range   Glucose-Capillary 119 (H) 70 - 99 mg/dL    Comment: Glucose reference range applies only to samples taken after fasting for at least 8 hours.  Glucose, capillary     Status: Abnormal   Collection Time: 03/18/20  3:54 AM  Result Value Ref Range   Glucose-Capillary 117 (H) 70 - 99 mg/dL    Comment: Glucose reference range applies only to samples taken after fasting for at least 8 hours.  Glucose, capillary     Status: Abnormal   Collection Time: 03/18/20  7:53 AM  Result Value  Ref Range   Glucose-Capillary 155 (H) 70 - 99 mg/dL    Comment: Glucose reference range applies only to samples taken after fasting for at least 8 hours.  Glucose, capillary     Status: Abnormal   Collection Time: 03/18/20 11:25 AM  Result Value Ref Range   Glucose-Capillary 141 (H) 70 - 99 mg/dL    Comment: Glucose reference range applies only to samples taken after fasting for at least 8 hours.  Glucose, capillary     Status: Abnormal   Collection Time: 03/18/20  3:35 PM  Result Value Ref Range   Glucose-Capillary 152 (H) 70 - 99 mg/dL  Comment: Glucose reference range applies only to samples taken after fasting for at least 8 hours.  Glucose, capillary     Status: Abnormal   Collection Time: 03/18/20  7:44 PM  Result Value Ref Range   Glucose-Capillary 172 (H) 70 - 99 mg/dL    Comment: Glucose reference range applies only to samples taken after fasting for at least 8 hours.  Glucose, capillary     Status: Abnormal   Collection Time: 03/18/20 11:50 PM  Result Value Ref Range   Glucose-Capillary 143 (H) 70 - 99 mg/dL    Comment: Glucose reference range applies only to samples taken after fasting for at least 8 hours.  CBC     Status: Abnormal   Collection Time: 03/19/20  2:48 AM  Result Value Ref Range   WBC 12.0 (H) 4.0 - 10.5 K/uL   RBC 2.92 (L) 3.87 - 5.11 MIL/uL   Hemoglobin 8.6 (L) 12.0 - 15.0 g/dL   HCT 28.3 (L) 36 - 46 %   MCV 96.9 80.0 - 100.0 fL   MCH 29.5 26.0 - 34.0 pg   MCHC 30.4 30.0 - 36.0 g/dL   RDW 15.4 11.5 - 15.5 %   Platelets 276 150 - 400 K/uL   nRBC 0.0 0.0 - 0.2 %    Comment: Performed at [facility], [place] [address] [place], [place] [address]  Basic metabolic panel     Status: Abnormal   Collection Time: 03/19/20  2:48 AM  Result Value Ref Range   Sodium 142 135 - 145 mmol/L   Potassium 4.2 3.5 - 5.1 mmol/L   Chloride 108 98 - 111 mmol/L   CO2 25 22 - 32 mmol/L   Glucose, Bld 163 (H) 70 - 99 mg/dL    Comment: Glucose reference range  applies only to samples taken after fasting for at least 8 hours.   BUN 55 (H) 8 - 23 mg/dL   Creatinine, Ser 1.82 (H) 0.44 - 1.00 mg/dL   Calcium 8.8 (L) 8.9 - 10.3 mg/dL   GFR calc non Af Amer 27 (L) >60 mL/min   GFR calc Af Amer 32 (L) >60 mL/min   Anion gap 9 5 - 15    Comment: Performed at [facility] [address], [place], [place] [address]  Magnesium     Status: None   Collection Time: 03/19/20  2:48 AM  Result Value Ref Range   Magnesium 2.1 1.7 - 2.4 mg/dL    Comment: Performed at [facility] [address], [place], [place] [address]  Glucose, capillary     Status: Abnormal   Collection Time: 03/19/20  5:08 AM  Result Value Ref Range   Glucose-Capillary 177 (H) 70 - 99 mg/dL    Comment: Glucose reference range applies only to samples taken after fasting for at least 8 hours.   Comment 1 QC Due   Glucose, capillary     Status: Abnormal   Collection Time: 03/19/20  8:47 AM  Result Value Ref Range   Glucose-Capillary 159 (H) 70 - 99 mg/dL    Comment: Glucose reference range applies only to samples taken after fasting for at least 8 hours.  I-STAT 7, (LYTES, BLD GAS, ICA, H+H)     Status: Abnormal   Collection Time: 03/19/20 10:32 AM  Result Value Ref Range   pH, Arterial 7.399 7.35 - 7.45   pCO2 arterial 36.7 32 - 48 mmHg   pO2, Arterial 92 83 - 108  mmHg   Bicarbonate 22.6 20.0 - 28.0 mmol/L   TCO2 24 22 - 32 mmol/L   O2 Saturation 97.0 %   Acid-base deficit 2.0 0.0 - 2.0 mmol/L   Sodium 141 135 - 145 mmol/L   Potassium 4.3 3.5 - 5.1 mmol/L   Calcium, Ion 1.26 1.15 - 1.40 mmol/L   HCT 29.0 (L) 36 - 46 %   Hemoglobin 9.9 (L) 12.0 - 15.0 g/dL   Patient temperature 99.3 F    Collection site Radial    Drawn by RT    Sample type ARTERIAL   Glucose, capillary     Status: Abnormal   Collection Time: 03/19/20 11:50 AM  Result Value Ref Range   Glucose-Capillary 156 (H) 70 - 99 mg/dL    Comment: Glucose reference range applies only to samples taken after  fasting for at least 8 hours.  Glucose, capillary     Status: Abnormal   Collection Time: 03/19/20  3:50 PM  Result Value Ref Range   Glucose-Capillary 195 (H) 70 - 99 mg/dL    Comment: Glucose reference range applies only to samples taken after fasting for at least 8 hours.    Recent Results (from the past 240 hour(s))  Culture, Urine     Status: None   Collection Time: 03/12/20  3:57 PM   Specimen: Urine, Catheterized  Result Value Ref Range Status   Specimen Description URINE, CATHETERIZED  Final   Special Requests NONE  Final   Culture   Final    NO GROWTH Performed at [facility], [address] [place], [place] [address]    Report Status 03/13/2020 FINAL  Final  Culture, blood (routine x 2)     Status: None   Collection Time: 03/12/20  4:24 PM   Specimen: BLOOD LEFT ARM  Result Value Ref Range Status   Specimen Description BLOOD LEFT ARM  Final   Special Requests   Final    BOTTLES DRAWN AEROBIC AND ANAEROBIC Blood Culture adequate volume   Culture   Final    NO GROWTH 5 DAYS Performed at [facility], [place] [address] [place], [place] [address]    Report Status 03/17/2020 FINAL  Final  Culture, blood (routine x 2)     Status: None   Collection Time: 03/12/20  4:36 PM   Specimen: BLOOD LEFT ARM  Result Value Ref Range Status   Specimen Description BLOOD LEFT ARM  Final   Special Requests   Final    BOTTLES DRAWN AEROBIC AND ANAEROBIC Blood Culture adequate volume   Culture   Final    NO GROWTH 5 DAYS Performed at [facility], [place] [address] [place], [place] [address]    Report Status 03/17/2020 FINAL  Final  CSF culture     Status: None   Collection Time: 03/12/20  4:59 PM   Specimen: CSF; Cerebrospinal Fluid  Result Value Ref Range Status   Specimen Description CSF  Final   Special Requests Normal  Final   Gram Stain   Final    WBC PRESENT,BOTH PMN AND MONONUCLEAR NO ORGANISMS SEEN CYTOSPIN SMEAR    Culture   Final    NO GROWTH 3  DAYS Performed at [facility], [place] [address] [place], [place] [address]    Report Status 03/16/2020 FINAL  Final  Culture, respiratory     Status: None   Collection Time: 03/13/20  9:17 AM   Specimen: Bronchoalveolar Lavage; Respiratory  Result Value Ref Range Status   Specimen Description BRONCHIAL ALVEOLAR LAVAGE  Final   Special Requests NONE  Final   Gram Stain   Final    FEW WBC PRESENT, PREDOMINANTLY MONONUCLEAR NO ORGANISMS SEEN    Culture   Final    NO GROWTH 2 DAYS Performed at [facility], [address], [place], [place] [address]    Report Status 03/15/2020 FINAL  Final  Culture, respiratory (non-expectorated)     Status: None (Preliminary result)   Collection Time: 03/17/20  3:07 PM   Specimen: Tracheal Aspirate; Respiratory  Result Value Ref Range Status   Specimen Description TRACHEAL ASPIRATE  Final   Special Requests NONE  Final   Gram Stain NO WBC SEEN NO ORGANISMS SEEN   Final   Culture   Final    RARE YEAST IDENTIFICATION TO FOLLOW Performed at [facility], [place] [address], [place], [place] [address]    Report Status PENDING  Incomplete    Lipid Panel No results for input(s): CHOL, TRIG, HDL, CHOLHDL, VLDL, LDLCALC in the last 72 hours.  Studies/Results: EEG  Result Date: 03/19/2020 Lora Havens, MD     03/19/2020  5:42 PM Patient Name: Kimberly Howell MRN: [id number] Epilepsy Attending: Lora Havens Referring Physician/Provider: Dr Molli Hazard Date: 03/19/2020 Duration: 31.31 mins Patient history: 73y m s/p cardiac arrest with >45 min before ROSC. EEG to evaluate for seizure. Level of alertness: awake AEDs during EEG study: None Technical aspects: This EEG study was done with scalp electrodes positioned according to the 10-20 International system of electrode placement. Electrical activity was acquired at a sampling rate of _0  and reviewed with a high frequency filter of _1  and a low frequency filter of _2 . EEG data  were recorded continuously and digitally stored. Description: No posterior dominant rhythm was seen. EEG showed continuous generalized low amplitude 3 to 6 Hz theta-delta slowing. Frequent episodes of facial twitching were noted. Concomitant eeg before, during and after the events didn't show eeg change to suggest seizure.  Hyperventilation and photic stimulation were not performed.   ABNORMALITY - Continuous slow, generalized IMPRESSION: This study is suggestive of moderate diffuse encephalopathy, nonspecific etiology. No definite seizures or epileptiform discharges were seen throughout the recording. Patient was noted to have episodes of facial twitching without concomitant eeg change. However focal motor seizures may not be seen on scalp eeg. Therefore, clinical correlation is recommended. Myoclonus is also in the differential. Dr Cheral Marker was notified. Lora Havens   DG CHEST PORT 1 VIEW  Result Date: 03/19/2020 CLINICAL DATA:  Ventilator dependence. EXAM: PORTABLE CHEST 1 VIEW COMPARISON:  03/16/2020 FINDINGS: 0535 hours. Low lung volumes. Tracheostomy tube remains in place. The cardio pericardial silhouette is enlarged. There is pulmonary vascular congestion without overt pulmonary edema. Bibasilar atelectasis/infiltrate has improved in the interval. Degenerative changes noted in both shoulders. Telemetry leads overlie the chest. IMPRESSION: Low volume film with cardiomegaly. Interval improvement in bibasilar aeration. Electronically Signed   By: Misty Stanley M.D.   On: 03/19/2020 08:04    Medications: . chlorhexidine  15 mL Mouth Rinse BID  . Chlorhexidine Gluconate Cloth  6 each Topical Daily  . feeding supplement (PROSource TF)  45 mL Per Tube Daily  . free water  200 mL Per Tube Q4H  . furosemide  40 mg Intravenous Daily  . heparin  5,000 Units Subcutaneous Q8H  . influenza vaccine adjuvanted  0.5 mL Intramuscular Tomorrow-1000  . insulin aspart  0-15 Units Subcutaneous Q4H  . mouth  rinse  15 mL Mouth Rinse q12n4p  . melatonin  3 mg Per Tube QHS  . pantoprazole (PROTONIX) IV  40 mg Intravenous QHS  . pneumococcal 23 valent vaccine  0.5 mL Intramuscular Tomorrow-1000  . sodium chloride flush  10-40 mL Intracatheter Q12H  . [START ON 03/20/2020] valproic acid  5 mg/kg Oral Q8H   Infusions: . sodium chloride Stopped (03/06/20 1523)  . cefTRIAXone (ROCEPHIN)  IV    . feeding supplement (VITAL AF 1.2 CAL) 60 mL/hr at 03/19/20 1519  . valproate sodium       Assessment: 74 year old female with TIIDM, CKD, morbid obesity, COPD and chronic hypoxic respiratory failure on supplemental O2 admitted after collapsing at home with cardiac arrest, undergoing 45 minutes of CPR with ROSC. MRI on 9/24 showed symmetric DWI abnormality suggestive of anoxic brain injury within the medial temporal lobes and cerebellum with possible punctate acute infarct of paramedian right frontal lobe. Follow up MRI showed some improvement, but there are still signal abnormalities consistent with anoxic brain injury.   -- Patient has shown little clinical improvement in terms of her level of consciousness during her hospital course. She has developed worsening facial, body, and extremity twitching over the last day. Neurologic status appears similar to prior assessment except for new twitching, which is mostly localized to the face with occasional spasms of the extremities.  -- Spot EEG reveals generalized continuous slowing. The study is suggestive of moderate diffuse encephalopathy, nonspecific to etiology. No definite seizures or epileptiform discharges were seen throughout the recording. Of note, the patient was noted to have episodes of facial twitching without concomitant EEG change. However, focal motor seizures may not be seen on scalp eeg. Therefore, clinical correlation was recommended. Myoclonus is also in the differential.  Recommendations: - LTM EEG monitoring has been ordered  - Starting Depakote 20  mg/kg with IV load, then 5 mg/kg q8h via PEG - Neurology will continue to follow with you.    LOS: 16 days    Resident participating in this evaluation: Seawell, Jaimie A, DO 03/19/2020, 6:08 PM Pager: [phone]  40 minutes spent in the neurological evaluation and management of this critically ill patient. Time spent included coordination of care.   Electronically signed: Dr. Kerney Elbe

## 2020-03-19 NOTE — Progress Notes (Signed)
EEG completed, results pending. 

## 2020-03-20 ENCOUNTER — Encounter (HOSPITAL_COMMUNITY): Payer: Self-pay | Admitting: Pulmonary Disease

## 2020-03-20 DIAGNOSIS — Z9911 Dependence on respirator [ventilator] status: Secondary | ICD-10-CM | POA: Diagnosis not present

## 2020-03-20 DIAGNOSIS — J9621 Acute and chronic respiratory failure with hypoxia: Secondary | ICD-10-CM | POA: Diagnosis not present

## 2020-03-20 DIAGNOSIS — Z930 Tracheostomy status: Secondary | ICD-10-CM | POA: Diagnosis not present

## 2020-03-20 DIAGNOSIS — R251 Tremor, unspecified: Secondary | ICD-10-CM | POA: Diagnosis not present

## 2020-03-20 DIAGNOSIS — G931 Anoxic brain damage, not elsewhere classified: Secondary | ICD-10-CM

## 2020-03-20 LAB — GLUCOSE, CAPILLARY
Glucose-Capillary: 158 mg/dL — ABNORMAL HIGH (ref 70–99)
Glucose-Capillary: 213 mg/dL — ABNORMAL HIGH (ref 70–99)
Glucose-Capillary: 191 mg/dL — ABNORMAL HIGH (ref 70–99)
Glucose-Capillary: 198 mg/dL — ABNORMAL HIGH (ref 70–99)
Glucose-Capillary: 184 mg/dL — ABNORMAL HIGH (ref 70–99)
Glucose-Capillary: 165 mg/dL — ABNORMAL HIGH (ref 70–99)

## 2020-03-20 LAB — CBC
HCT: 30.6 % — ABNORMAL LOW (ref 36.0–46.0)
Hemoglobin: 9.5 g/dL — ABNORMAL LOW (ref 12.0–15.0)
MCH: 29.5 pg (ref 26.0–34.0)
MCHC: 31 g/dL (ref 30.0–36.0)
MCV: 95 fL (ref 80.0–100.0)
Platelets: 287 10*3/uL (ref 150–400)
RBC: 3.22 MIL/uL — ABNORMAL LOW (ref 3.87–5.11)
RDW: 15.2 % (ref 11.5–15.5)
WBC: 11.3 10*3/uL — ABNORMAL HIGH (ref 4.0–10.5)
nRBC: 0 % (ref 0.0–0.2)

## 2020-03-20 LAB — BASIC METABOLIC PANEL
Anion gap: 15 (ref 5–15)
BUN: 54 mg/dL — ABNORMAL HIGH (ref 8–23)
CO2: 20 mmol/L — ABNORMAL LOW (ref 22–32)
Calcium: 9 mg/dL (ref 8.9–10.3)
Chloride: 104 mmol/L (ref 98–111)
Creatinine, Ser: 1.77 mg/dL — ABNORMAL HIGH (ref 0.44–1.00)
GFR calc Af Amer: 33 mL/min — ABNORMAL LOW (ref >60)
GFR calc non Af Amer: 28 mL/min — ABNORMAL LOW (ref >60)
Glucose, Bld: 179 mg/dL — ABNORMAL HIGH (ref 70–99)
Potassium: 4 mmol/L (ref 3.5–5.1)
Sodium: 139 mmol/L (ref 135–145)

## 2020-03-20 LAB — CULTURE, RESPIRATORY W GRAM STAIN: Gram Stain: NONE SEEN

## 2020-03-20 MED ORDER — MIDAZOLAM HCL 2 MG/2ML IJ SOLN: 1.0000 mg | Freq: Once | INTRAMUSCULAR | Status: AC

## 2020-03-20 MED ORDER — MIDAZOLAM HCL 2 MG/2ML IJ SOLN
INTRAMUSCULAR | Status: AC
  Administered 2020-03-20: 1 mg via INTRAVENOUS
  Filled 2020-03-20: qty 2

## 2020-03-20 MED ORDER — INSULIN DETEMIR 100 UNIT/ML ~~LOC~~ SOLN
8.0000 [IU] | Freq: Every day | SUBCUTANEOUS | Status: DC
  Administered 2020-03-20 – 2020-03-22 (×3): 8 [IU] via SUBCUTANEOUS
  Filled 2020-03-20 (×3): qty 0.08

## 2020-03-20 NOTE — Progress Notes (Signed)
NAME:  Kimberly Howell, MRN:  [id number], DOB:  18-Sep-1945, LOS: 75 ADMISSION DATE:  03/03/2020, CONSULTATION DATE:  03/20/20 REFERRING MD:  EDP, CHIEF COMPLAINT:  Cardiac arrest   Brief History   74 year old female with PMH chronic respiratory failure with COPD on 3 L home O2 and CPAP nightly, stage IV CKD, hypertension, morbid obesity who had a cardiac arrest with >45 minutes CPR before ROSC then lost pulses three more times.  Intubated and PCCM consulted for admission  Past Medical History  Morbid obesity with BMI of 50.0-59.9, adult ([place]) Chronic respiratory failure (HCC) CKD (chronic kidney disease) stage 3, GFR 30-59 ml/min (HCC) Essential hypertension OSA on CPAP  Significant Hospital Events   9/18 Admit to PCCM 9/28 Trach  10/4 Started Depakote   Consults:  Ophthamology ENT Neurology Palliative  Procedures:  9/18 ETT >> 9/28 9/21 CVC 3L >> to be taken out 9/29 post PEG 9/27 LP  9/28 Tracheostomy  9/29 IR PEG   Significant Diagnostic Tests:  CT head/C-spine/Maxillofacial >> Small left frontal scalp hematoma without skull fracture. Fracture of the left lamina papyracea with herniation of the left medial rectus muscle into the ethmoid sinus. Small amount of retro bulbar stranding, likely hemorrhage.  Right lung apex consolidation concerning for pneumonia. TTE 9/18 >> LVEF 60-65%; no regional wall abnormalities; G1DD, normal RV EEG 9/20 >> diffuse encephalopathy MRI brain 9/24 >> Findings are highly suspicious for hypoxic/ischemic injury. Two punctate acute infarcts within the paramedian right frontal lobe are difficult to exclude. Medially displaced acute fracture of the left lamina papyracea. There is also an acute, displaced fracture of the left fovea ethmoidalis. Orbital fat and the medial rectus muscle extend into the left ethmoid sinus. Additionally, orbital fat extends from the left ethmoid sinus through the fracture defect in the fovea ethmoidalis intracranially into  the left anterior cranial fossa. The intracranial component of orbital fat measures 1 cm and exerts mild local mass effect upon the anteroinferior left frontal lobe.   Micro Data:  9/18 Sars-CoV-2>>negative 9/18 MRSA >> positive 9/27 CSF culture >> negative 9/27 UC >> negative 9/27 BC x 2 >> negative 9/28 BAL >> negative 10/2 Tracheal aspirate >>  10/2 UA >> negative   Antimicrobials:  Unasyn 9/19 >> 9/25 Vancomycin >> 9/27 (1 dose) Ceftriaxone 9/27 >> 9/29 Cefepime 10/2 >>  Vanco 10/2 >>10  Interim history/subjective:  Tmax 99.7 / WBC 11.3 On vent 35%, PEEP 5,   CBGS 213, 158 I/O UOP 1.7cc  Net -739.9  Objective   Blood pressure 124/66, pulse 91, temperature 99.3 F (37.4 C), temperature source Oral, resp. rate (!) 25, height _0  (1.727 m), weight 132 kg, SpO2 97 %.    Vent Mode: PRVC FiO2 (%):  [30 %-35 %] 30 % Set Rate:  [22 bmp] 22 bmp Vt Set:  [380 mL] 380 mL PEEP:  [5 cmH20] 5 cmH20 Pressure Support:  [10 cmH20] 10 cmH20 Plateau Pressure:  [17 cmH20-22 cmH20] 22 cmH20   Intake/Output Summary (Last 24 hours) at 03/20/2020 0743 Last data filed at 03/20/2020 0400 Gross per 24 hour  Intake 965.12 ml  Output 1705 ml  Net -739.88 ml   Filed Weights   03/17/20 0600 03/18/20 0454 03/19/20 0500  Weight: 132.4 kg 131.6 kg 132 kg   EXAM General: unwell appearing 74 yr old female lying in bed, sleeping HEENT: no scleral icterus, hematoma over left forehead with dried blood, midline tracheostomy site dry and clean  Neuro: global facial twitching when awake, minimally  responsive to words, positive corneal, gag and cough reflex, does not withdraw to pain in UE, withdraws to pain in LE CV: s1, s2, RRR, no murmurs PULM: no respiratory distress, few crackles bilaterally GI: soft, non distended, bowel sounds present  Extremities: mild peripheral edema Skin: no rashes, warm and dry   Resolved Problem List:  AKI on CKD- now close to euvolemic and new baseline  Cr Hypernatremia   Assessment & Plan:   Cardiac Arrest with Hypoxic and Hypercarbic Respiratory Failure Patient with prolonged downtime  > 45 minutes. Presumed PEA cardiac arrest. Had 4 separate events with ROSC obtained. EKG without signs of ischemia.  Slow neurological recovery, family Finger 9/27 agreed for [facility] placement. Trach on 9/28.  -PRVC 8cc/kg as rest mode  -Daily SBT as tolerated  -Care management working toward [facility] placement  -PT efforts  R sided Pneumonia Probable aspiration during cardiac event.  S/p unasyn x 7 days. s/p ceftriaxone 9/27, s/p cefazolin, f/u BAL and culture data but suspect noninfectious. Cefepime (10/1-10/4), Vancomycin (10/1-10/4), Ceftriaxone (10/4-) Tracheal aspirate 10/02: no growth. 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 Pt has not been on sedation for several days. Dr Cheral Marker started pt on Depakote 20 mg/kg with IV load, then 5 mg/kg q8h via PEG. Since then facial twitching is still present but improved. Obvious when patient is awake/agitated and minimal/absent when sleeping. 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  Ophthalmologist noted patient's EOM intact and no limitations of gaze & no signs of entrapment seen.  Therefore ENT recommends to not repair orbital injury at this time. Neurosurgery consulted 9/28, stating no indication for repair unless clear CSF leak which the patient has no symptoms of. -follow neuro exam  -follow up with outpatient  Ophthalmology   Anemia 2/2 chronic disease, critical illness, stable  Hb 9.5, s/p 2 unit RBC on 9/25 and 10/2  -Monitor Hb with CBC   -Transfuse for Hb <7%  Type 2 diabetes CBGs 213, 158 -Continue sliding scale insulin  -CBG goal 140-180  HFpEF Echo Grade 1 diastolic dysfunction -Strict I/O's  -Continue Lasix 40 mg IV daily   Hypertension SBP 118-182, DBP 67-102 Home medications: Amlodipine, carvedilol, furosemide, lisinopril on hold s/p pressor requirement this admission. EF 60-65% -Strict I/O -Daily BMP -Hold all home antihypertensives   AKI, improving  Cr 1.77 -Trend BMP / urinary output -Replace electrolytes as indicated -Avoid nephrotoxic agents, ensure adequate renal perfusion  Hypernatremia, resolved Na 139 -Continue free water via PEF, 239m Q4  -Monitor with BMP   Constipation -PRN miralax, colace  At Risk Malnutrition  PEG Status  PEG (9/29) -TF per Nutrition   Best practice:  Diet: NPO, TF  Pain/Anxiety/Delirium protocol (if indicated): Fentanyl IVP PRN VAP protocol (if indicated): in place: HOB 30 degrees, suction as needed DVT prophylaxis: Heparin SQ/ SCDs GI prophylaxis: Protonix Glucose control: SSI & levemir BID Mobility: Bedrest, mobilize as tolerated Code Status: DNR Family Communication: Niece updated at bedside 10/3 on plan of care.  Disposition: ICU  Critical care time:      PLattie HawMD, PGY2 [facility]  03/20/2020, 7:43 AM   Please see [email or web address] for pager details.

## 2020-03-20 NOTE — Plan of Care (Signed)
  Problem: Health Behavior/Discharge Planning: Goal: Ability to manage health-related needs will improve Outcome: Progressing   Problem: Clinical Measurements: Goal: Ability to maintain clinical measurements within normal limits will improve Outcome: Progressing Goal: Will remain free from infection Outcome: Progressing Goal: Diagnostic test results will improve Outcome: Progressing Goal: Respiratory complications will improve Outcome: Progressing Goal: Cardiovascular complication will be avoided Outcome: Progressing   Problem: Activity: Goal: Risk for activity intolerance will decrease Outcome: Progressing   Problem: Nutrition: Goal: Adequate nutrition will be maintained Outcome: Progressing   Problem: Coping: Goal: Level of anxiety will decrease Outcome: Progressing   Problem: Elimination: Goal: Will not experience complications related to bowel motility Outcome: Progressing Goal: Will not experience complications related to urinary retention Outcome: Progressing   Problem: Pain Managment: Goal: General experience of comfort will improve Outcome: Progressing   Problem: Safety: Goal: Ability to remain free from injury will improve Outcome: Progressing   Problem: Skin Integrity: Goal: Risk for impaired skin integrity will decrease Outcome: Progressing   Problem: Activity: Goal: Ability to tolerate increased activity will improve Outcome: Progressing   Problem: Respiratory: Goal: Ability to maintain a clear airway and adequate ventilation will improve Outcome: Progressing

## 2020-03-20 NOTE — Progress Notes (Signed)
This note also relates to the following rows which could not be included: SpO2 - Cannot attach notes to unvalidated device data  RT NOTE: Trach sutures removed per protocol as patient is seven days post trach procedure. Trach care performed, inner cannula changed and trach ties changed and secure. New mepilex applied under trach site. Vitals are stable. RT will continue to monitor.

## 2020-03-20 NOTE — Progress Notes (Signed)
Subjective: Per RN, there has been a mild decrease in the patient's frequency and intensity of facial twitching since valproic acid was started.   Objective: Current vital signs: BP 124/66   Pulse 91   Temp 99.3 F (37.4 C) (Oral)   Resp (!) 25   Ht _0  (1.727 m)   Wt 132 kg   SpO2 97%   BMI 44.25 kg/m  Vital signs in last 24 hours: Temp:  [99.2 F (37.3 C)-99.7 F (37.6 C)] 99.3 F (37.4 C) (10/05 0400) Pulse Rate:  [75-100] 91 (10/05 0630) Resp:  [19-32] 25 (10/05 0630) BP: (118-182)/(62-102) 124/66 (10/05 0630) SpO2:  [93 %-100 %] 97 % (10/05 0630) FiO2 (%):  [30 %-35 %] 30 % (10/05 0435)  Intake/Output from previous day: 10/04 0701 - 10/05 0700 In: 965.1 [I.V.:20; NG/GT:660; IV Piggyback:285.1] Out: 1705 [Urine:1605; Stool:100] Intake/Output this shift: No intake/output data recorded. Nutritional status:  Diet Order    None      Neurologic Exam:  Mentation: Non-verbal. Eyes briefly open spontaneously in the context of facial myoclonus.  CN:  II, III, IV, VI: Clenches eyes tightly shut when examiner attempts to elicit pupillary light reflexes. Eyelid twitching occurs throughout the exam, non-rhythmically. Eye twitching increases with stimulation.  V, VII: Increased facial twitching to stimulation. When not aroused, facial twitching decreases significantly in amplitude and frequency. .  Motor/Sensory: Occasional full body twitching which is of low amplitude. No volitional movement to noxious stimuli. Tone is decreased x 4.    Lab Results: Results for orders placed or performed during the hospital encounter of 03/03/20 (from the past 48 hour(s))  Glucose, capillary     Status: Abnormal   Collection Time: 03/18/20  7:53 AM  Result Value Ref Range   Glucose-Capillary 155 (H) 70 - 99 mg/dL    Comment: Glucose reference range applies only to samples taken after fasting for at least 8 hours.  Glucose, capillary     Status: Abnormal   Collection Time: 03/18/20 11:25  AM  Result Value Ref Range   Glucose-Capillary 141 (H) 70 - 99 mg/dL    Comment: Glucose reference range applies only to samples taken after fasting for at least 8 hours.  Glucose, capillary     Status: Abnormal   Collection Time: 03/18/20  3:35 PM  Result Value Ref Range   Glucose-Capillary 152 (H) 70 - 99 mg/dL    Comment: Glucose reference range applies only to samples taken after fasting for at least 8 hours.  Glucose, capillary     Status: Abnormal   Collection Time: 03/18/20  7:44 PM  Result Value Ref Range   Glucose-Capillary 172 (H) 70 - 99 mg/dL    Comment: Glucose reference range applies only to samples taken after fasting for at least 8 hours.  Glucose, capillary     Status: Abnormal   Collection Time: 03/18/20 11:50 PM  Result Value Ref Range   Glucose-Capillary 143 (H) 70 - 99 mg/dL    Comment: Glucose reference range applies only to samples taken after fasting for at least 8 hours.  CBC     Status: Abnormal   Collection Time: 03/19/20  2:48 AM  Result Value Ref Range   WBC 12.0 (H) 4.0 - 10.5 K/uL   RBC 2.92 (L) 3.87 - 5.11 MIL/uL   Hemoglobin 8.6 (L) 12.0 - 15.0 g/dL   HCT 28.3 (L) 36 - 46 %   MCV 96.9 80.0 - 100.0 fL   MCH 29.5 26.0 - 34.0  pg   MCHC 30.4 30.0 - 36.0 g/dL   RDW 15.4 11.5 - 15.5 %   Platelets 276 150 - 400 K/uL   nRBC 0.0 0.0 - 0.2 %    Comment: Performed at [facility], [place] [address], [place], [place] [address]  Basic metabolic panel     Status: Abnormal   Collection Time: 03/19/20  2:48 AM  Result Value Ref Range   Sodium 142 135 - 145 mmol/L   Potassium 4.2 3.5 - 5.1 mmol/L   Chloride 108 98 - 111 mmol/L   CO2 25 22 - 32 mmol/L   Glucose, Bld 163 (H) 70 - 99 mg/dL    Comment: Glucose reference range applies only to samples taken after fasting for at least 8 hours.   BUN 55 (H) 8 - 23 mg/dL   Creatinine, Ser 1.82 (H) 0.44 - 1.00 mg/dL   Calcium 8.8 (L) 8.9 - 10.3 mg/dL   GFR calc non Af Amer 27 (L) >60 mL/min   GFR calc Af Amer 32  (L) >60 mL/min   Anion gap 9 5 - 15    Comment: Performed at [facility] [address] [place], [place] [address]  Magnesium     Status: None   Collection Time: 03/19/20  2:48 AM  Result Value Ref Range   Magnesium 2.1 1.7 - 2.4 mg/dL    Comment: Performed at [facility] [address], [place], [place] [address]  Glucose, capillary     Status: Abnormal   Collection Time: 03/19/20  5:08 AM  Result Value Ref Range   Glucose-Capillary 177 (H) 70 - 99 mg/dL    Comment: Glucose reference range applies only to samples taken after fasting for at least 8 hours.   Comment 1 QC Due   Glucose, capillary     Status: Abnormal   Collection Time: 03/19/20  8:47 AM  Result Value Ref Range   Glucose-Capillary 159 (H) 70 - 99 mg/dL    Comment: Glucose reference range applies only to samples taken after fasting for at least 8 hours.  I-STAT 7, (LYTES, BLD GAS, ICA, H+H)     Status: Abnormal   Collection Time: 03/19/20 10:32 AM  Result Value Ref Range   pH, Arterial 7.399 7.35 - 7.45   pCO2 arterial 36.7 32 - 48 mmHg   pO2, Arterial 92 83 - 108 mmHg   Bicarbonate 22.6 20.0 - 28.0 mmol/L   TCO2 24 22 - 32 mmol/L   O2 Saturation 97.0 %   Acid-base deficit 2.0 0.0 - 2.0 mmol/L   Sodium 141 135 - 145 mmol/L   Potassium 4.3 3.5 - 5.1 mmol/L   Calcium, Ion 1.26 1.15 - 1.40 mmol/L   HCT 29.0 (L) 36 - 46 %   Hemoglobin 9.9 (L) 12.0 - 15.0 g/dL   Patient temperature 99.3 F    Collection site Radial    Drawn by RT    Sample type ARTERIAL   Glucose, capillary     Status: Abnormal   Collection Time: 03/19/20 11:50 AM  Result Value Ref Range   Glucose-Capillary 156 (H) 70 - 99 mg/dL    Comment: Glucose reference range applies only to samples taken after fasting for at least 8 hours.  Glucose, capillary     Status: Abnormal   Collection Time: 03/19/20  3:50 PM  Result Value Ref Range   Glucose-Capillary 195 (H) 70 - 99 mg/dL    Comment: Glucose reference range  applies only to samples  taken after fasting for at least 8 hours.  Glucose, capillary     Status: Abnormal   Collection Time: 03/19/20  8:39 PM  Result Value Ref Range   Glucose-Capillary 169 (H) 70 - 99 mg/dL    Comment: Glucose reference range applies only to samples taken after fasting for at least 8 hours.  Glucose, capillary     Status: Abnormal   Collection Time: 03/20/20 12:47 AM  Result Value Ref Range   Glucose-Capillary 158 (H) 70 - 99 mg/dL    Comment: Glucose reference range applies only to samples taken after fasting for at least 8 hours.  Basic metabolic panel     Status: Abnormal   Collection Time: 03/20/20  1:02 AM  Result Value Ref Range   Sodium 139 135 - 145 mmol/L   Potassium 4.0 3.5 - 5.1 mmol/L   Chloride 104 98 - 111 mmol/L   CO2 20 (L) 22 - 32 mmol/L   Glucose, Bld 179 (H) 70 - 99 mg/dL    Comment: Glucose reference range applies only to samples taken after fasting for at least 8 hours.   BUN 54 (H) 8 - 23 mg/dL   Creatinine, Ser 1.77 (H) 0.44 - 1.00 mg/dL   Calcium 9.0 8.9 - 10.3 mg/dL   GFR calc non Af Amer 28 (L) >60 mL/min   GFR calc Af Amer 33 (L) >60 mL/min   Anion gap 15 5 - 15    Comment: Performed at [facility] [address], [place], [place] [address]  CBC     Status: Abnormal   Collection Time: 03/20/20  1:02 AM  Result Value Ref Range   WBC 11.3 (H) 4.0 - 10.5 K/uL   RBC 3.22 (L) 3.87 - 5.11 MIL/uL   Hemoglobin 9.5 (L) 12.0 - 15.0 g/dL   HCT 30.6 (L) 36 - 46 %   MCV 95.0 80.0 - 100.0 fL   MCH 29.5 26.0 - 34.0 pg   MCHC 31.0 30.0 - 36.0 g/dL   RDW 15.2 11.5 - 15.5 %   Platelets 287 150 - 400 K/uL   nRBC 0.0 0.0 - 0.2 %    Comment: Performed at [facility], [place] [address] [place], [place] [address]  Glucose, capillary     Status: Abnormal   Collection Time: 03/20/20  3:58 AM  Result Value Ref Range   Glucose-Capillary 213 (H) 70 - 99 mg/dL    Comment: Glucose reference range applies only to samples taken after fasting for at least 8 hours.     Recent Results (from the past 240 hour(s))  Culture, Urine     Status: None   Collection Time: 03/12/20  3:57 PM   Specimen: Urine, Catheterized  Result Value Ref Range Status   Specimen Description URINE, CATHETERIZED  Final   Special Requests NONE  Final   Culture   Final    NO GROWTH Performed at [facility], [address] [place], [place] [address]    Report Status 03/13/2020 FINAL  Final  Culture, blood (routine x 2)     Status: None   Collection Time: 03/12/20  4:24 PM   Specimen: BLOOD LEFT ARM  Result Value Ref Range Status   Specimen Description BLOOD LEFT ARM  Final   Special Requests   Final    BOTTLES DRAWN AEROBIC AND ANAEROBIC Blood Culture adequate volume   Culture   Final    NO GROWTH  5 DAYS Performed at [facility], [place] [address] [place], [place] [address]    Report Status 03/17/2020 FINAL  Final  Culture, blood (routine x 2)     Status: None   Collection Time: 03/12/20  4:36 PM   Specimen: BLOOD LEFT ARM  Result Value Ref Range Status   Specimen Description BLOOD LEFT ARM  Final   Special Requests   Final    BOTTLES DRAWN AEROBIC AND ANAEROBIC Blood Culture adequate volume   Culture   Final    NO GROWTH 5 DAYS Performed at [facility], [place] [address] [place], [place] [address]    Report Status 03/17/2020 FINAL  Final  CSF culture     Status: None   Collection Time: 03/12/20  4:59 PM   Specimen: CSF; Cerebrospinal Fluid  Result Value Ref Range Status   Specimen Description CSF  Final   Special Requests Normal  Final   Gram Stain   Final    WBC PRESENT,BOTH PMN AND MONONUCLEAR NO ORGANISMS SEEN CYTOSPIN SMEAR    Culture   Final    NO GROWTH 3 DAYS Performed at [facility], [place] [address], [place], [place] [address]    Report Status 03/16/2020 FINAL  Final  Culture, respiratory     Status: None   Collection Time: 03/13/20  9:17 AM   Specimen: Bronchoalveolar Lavage; Respiratory  Result Value Ref Range Status    Specimen Description BRONCHIAL ALVEOLAR LAVAGE  Final   Special Requests NONE  Final   Gram Stain   Final    FEW WBC PRESENT, PREDOMINANTLY MONONUCLEAR NO ORGANISMS SEEN    Culture   Final    NO GROWTH 2 DAYS Performed at [facility], [address], [place], [place] [address]    Report Status 03/15/2020 FINAL  Final  Culture, respiratory (non-expectorated)     Status: None (Preliminary result)   Collection Time: 03/17/20  3:07 PM   Specimen: Tracheal Aspirate; Respiratory  Result Value Ref Range Status   Specimen Description TRACHEAL ASPIRATE  Final   Special Requests NONE  Final   Gram Stain NO WBC SEEN NO ORGANISMS SEEN   Final   Culture   Final    RARE YEAST IDENTIFICATION TO FOLLOW Performed at [facility], [place] [address], [place], [place] [address]    Report Status PENDING  Incomplete    Lipid Panel No results for input(s): CHOL, TRIG, HDL, CHOLHDL, VLDL, LDLCALC in the last 72 hours.  Studies/Results: EEG  Result Date: 03/19/2020 Lora Havens, MD     03/19/2020  5:42 PM Patient Name: Kimberly Howell MRN: [id number] Epilepsy Attending: Lora Havens Referring Physician/Provider: Dr Molli Hazard Date: 03/19/2020 Duration: 31.31 mins Patient history: 73y m s/p cardiac arrest with >45 min before ROSC. EEG to evaluate for seizure. Level of alertness: awake AEDs during EEG study: None Technical aspects: This EEG study was done with scalp electrodes positioned according to the 10-20 International system of electrode placement. Electrical activity was acquired at a sampling rate of _0  and reviewed with a high frequency filter of _1  and a low frequency filter of _2 . EEG data were recorded continuously and digitally stored. Description: No posterior dominant rhythm was seen. EEG showed continuous generalized low amplitude 3 to 6 Hz theta-delta slowing. Frequent episodes of facial twitching were noted. Concomitant eeg before, during and after the events  didn't show eeg change to suggest seizure.  Hyperventilation and photic  stimulation were not performed.   ABNORMALITY - Continuous slow, generalized IMPRESSION: This study is suggestive of moderate diffuse encephalopathy, nonspecific etiology. No definite seizures or epileptiform discharges were seen throughout the recording. Patient was noted to have episodes of facial twitching without concomitant eeg change. However focal motor seizures may not be seen on scalp eeg. Therefore, clinical correlation is recommended. Myoclonus is also in the differential. Dr Cheral Marker was notified. Lora Havens   DG CHEST PORT 1 VIEW  Result Date: 03/19/2020 CLINICAL DATA:  Ventilator dependence. EXAM: PORTABLE CHEST 1 VIEW COMPARISON:  03/16/2020 FINDINGS: 0535 hours. Low lung volumes. Tracheostomy tube remains in place. The cardio pericardial silhouette is enlarged. There is pulmonary vascular congestion without overt pulmonary edema. Bibasilar atelectasis/infiltrate has improved in the interval. Degenerative changes noted in both shoulders. Telemetry leads overlie the chest. IMPRESSION: Low volume film with cardiomegaly. Interval improvement in bibasilar aeration. Electronically Signed   By: Misty Stanley M.D.   On: 03/19/2020 08:04    Medications:  Scheduled: . chlorhexidine  15 mL Mouth Rinse BID  . Chlorhexidine Gluconate Cloth  6 each Topical Daily  . feeding supplement (PROSource TF)  45 mL Per Tube Daily  . free water  200 mL Per Tube Q4H  . furosemide  40 mg Intravenous Daily  . heparin  5,000 Units Subcutaneous Q8H  . influenza vaccine adjuvanted  0.5 mL Intramuscular Tomorrow-1000  . insulin aspart  0-15 Units Subcutaneous Q4H  . mouth rinse  15 mL Mouth Rinse q12n4p  . melatonin  3 mg Per Tube QHS  . pantoprazole (PROTONIX) IV  40 mg Intravenous QHS  . pneumococcal 23 valent vaccine  0.5 mL Intramuscular Tomorrow-1000  . sodium chloride flush  10-40 mL Intracatheter Q12H  . valproic acid  5 mg/kg  Oral Q8H   Continuous: . sodium chloride Stopped (03/06/20 1523)  . cefTRIAXone (ROCEPHIN)  IV Stopped (03/19/20 2230)  . feeding supplement (VITAL AF 1.2 CAL) 60 mL/hr at 03/19/20 1519    Assessment: 74 year old female with TIIDM, CKD, morbid obesity, COPD and chronic hypoxic respiratory failure on supplemental O2 admitted after collapsing at home with cardiac arrest, undergoing 45 minutes of CPR with ROSC. MRI on 9/24 showed symmetric DWI abnormality suggestive of anoxic brain injury within the medial temporal lobes and cerebellum with possible punctate acute infarct of paramedian right frontal lobe. Follow up MRI showed some improvement, but there are still signal abnormalities consistent with anoxic brain injury.   -- Patient has shown little clinical improvement in terms of her level of consciousness during her hospital course. She developed worsening facial, body, and extremity twitching yesterday after sedation was decreased. Twitching was also seen about one week ago. Neurologic status today appears similar to prior assessment except for mildly decreased frequency and amplitude of her facial twitching since initiation of valproic acid.  -- Spot EEG on Monday (10/4) revealed generalized continuous slowing. The study was suggestive of moderate diffuse encephalopathy, nonspecific to etiology.Nodefiniteseizures or epileptiform discharges were seen throughout the recording. Of note, the patient was noted to have episodes of facial twitching without concomitant EEG change. However, focal motor seizures may not be seen on scalp eeg. Therefore, clinical correlation was recommended. EEG-negative myoclonus is also in the differential. 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 It may not resolve with anticonvulsant therapy.  - Long-term prognosis for a meaningful neurological recovery  is poor, given lack of improvement over more than 2 weeks since her cardiac arrest, which was prolonged, in addition to the MRI findings and presence of persistent medication-refractory myoclonus.   - Family wishes are for maximum medical therapy and eventual discharge to SNF, despite the poor prognosis.    35 minutes spent in the neurological evaluation and management of this critically ill patient.    LOS: 17 days   _0  signed: Dr. Kerney Elbe 03/20/2020  7:52 AM

## 2020-03-20 NOTE — Progress Notes (Signed)
Physical Therapy Treatment Patient Details Name: Kimberly Howell MRN: [id number] DOB: 28-Dec-1945 Today's Date: 03/20/2020    History of Present Illness 74 yo admitted with cardiac arrest and fall on 9/18 s/p CPR with resultant left orbit fx. Intubated 9/18, trach 9/ 28, PEG 9/29. PMhx: morbid obesity, HTN, respiratory failure on 3L, CKD stage 4    PT Comments    Pt supine in bed on arrival and soiled from faulty purewick and rectal tube leaking.  Pt not following commands or elliciting response to VCs.  Pt does respond to noxious stimuli.  Pt continues to benefit from skilled rehab in a post acute setting.  Focus of session today on rolling for clean up and positioning for pressure relief and edema management.      Follow Up Recommendations  SNF;Supervision/Assistance - 24 hour     Equipment Recommendations  Hospital bed;Other (comment);Wheelchair (measurements PT);Wheelchair cushion (measurements PT)    Recommendations for Other Services       Precautions / Restrictions Precautions Precautions: Fall;Other (comment) Precaution Comments: trach, vent, PEG  Restrictions Weight Bearing Restrictions: No    Mobility  Bed Mobility Overal bed mobility: Needs Assistance Bed Mobility: Rolling Rolling: +2 for physical assistance;Total assist         General bed mobility comments: Pt continues to require total assistance to move from side to side for perianal care.  Post rolling placed in modified chair position and elevated B UEs to reduce edema in hands.  Pt's hands are very tight and washcloth roll placed in R hand to avoid contracture.  Transfers                 General transfer comment: NT unable to elicit any response to follow commands.  Ambulation/Gait                 Stairs             Wheelchair Mobility    Modified Rankin (Stroke Patients Only)       Balance Overall balance assessment: Needs assistance     Sitting balance - Comments:  placed pt in chair position using features of bed with                                    Cognition Arousal/Alertness: Lethargic Behavior During Therapy: WFL for tasks assessed/performed Overall Cognitive Status: Impaired/Different from baseline                                 General Comments: Pt following no commands today, she did elicit response to noxious stimuli and opened eyes.      Exercises      General Comments        Pertinent Vitals/Pain Pain Assessment: Faces Pain Score: 4  Pain Location: generalized with movement particularly bil shoulder and knee ROM Pain Descriptors / Indicators: Grimacing Pain Intervention(s): Monitored during session;Repositioned    Home Living                      Prior Function            PT Goals (current goals can now be found in the care plan section) Acute Rehab PT Goals PT Goal Formulation: Patient unable to participate in goal setting Potential to Achieve Goals: Fair Progress towards PT goals: Progressing toward goals  Frequency    Min 2X/week      PT Plan Current plan remains appropriate    Co-evaluation              AM-PAC PT "6 Clicks" Mobility   Outcome Measure  Help needed turning from your back to your side while in a flat bed without using bedrails?: Total Help needed moving from lying on your back to sitting on the side of a flat bed without using bedrails?: Total Help needed moving to and from a bed to a chair (including a wheelchair)?: Total Help needed standing up from a chair using your arms (e.g., wheelchair or bedside chair)?: Total Help needed to walk in hospital room?: Total Help needed climbing 3-5 steps with a railing? : Total 6 Click Score: 6    End of Session Equipment Utilized During Treatment: Gait belt Activity Tolerance: Patient tolerated treatment well Patient left: in bed;with call bell/phone within reach;with bed alarm set;with  nursing/sitter in room Nurse Communication: Mobility status;Need for lift equipment PT Visit Diagnosis: Other abnormalities of gait and mobility (R26.89);Muscle weakness (generalized) (M62.81);Other symptoms and signs involving the nervous system (R29.898)     Time: 1062-6948 PT Time Calculation (min) (ACUTE ONLY): 19 min  Charges:  $Therapeutic Activity: 8-22 mins                     Erasmo Leventhal , PTA Acute Rehabilitation Services Pager [phone] Office [phone]     Erle Guster Eli Hose 03/20/2020, 2:30 PM

## 2020-03-20 NOTE — TOC Progression Note (Addendum)
Transition of Care [facility]) - Progression Note    Patient Details  Name: Kimberly Howell MRN: [id number] Date of Birth: 12-21-1945  [facility]) CM/SW [place], [place] Phone Number: 03/20/2020, 11:13 AM  Clinical Narrative:     10/5- CSW left voicemail for patients sister [name] to call her back for update on SNF placement. CSW awaiting callback.  CSW spoke with [name] with [facility] and she said bed offer for patient is still pending. They are still reviewing patients referral. [name] requested additional clinicals from [facility] for patient. CSW faxed over additional clinicals for review. CSW awaiting call back from [facility] to see if they can accept patient for SNF placement. CSW tried to call patients sister [name] to give update on bed offers. CSW awaiting callback .CSW spoke with patients son [name] and updated him on SNF placement. CSW let patients son know she will follow back up with him once she hears from [facility].  CSW will continue to follow.  CSW will continue to follow.  Expected Discharge Plan: Skilled Nursing Facility Barriers to Discharge: Continued Medical Work up  Expected Discharge Plan and Services Expected Discharge Plan: [place]   Discharge Planning Services: CM Consult Post Acute Care Choice: Long Term Acute Care (LTAC) Living arrangements for the past 2 months: Single Family Home                                       Social Determinants of Health (SDOH) Interventions    Readmission Risk Interventions Readmission Risk Prevention Plan 03/14/2020  Transportation Screening Complete  PCP or Specialist Appt within 5-7 Days Complete  Home Care Screening Complete  Medication Review (RN CM) Complete

## 2020-03-20 NOTE — Progress Notes (Signed)
This chaplain greeted the Pt. sister Belenda Cruise on the unit, outside of the Pt. room.  The chaplain listened as Belenda Cruise shared the Pt. is less responsive today. Belenda Cruise remains hopeful the change is temporary.  The chaplain was unable to update [place] on care facilities. The chaplain recognized the Case Mgr role in updating the family as something comes available.  Belenda Cruise was appreciative of the conversation.  F/U spiritual care remains available as needed.

## 2020-03-20 NOTE — Progress Notes (Signed)
SLP Cancellation Note  Patient Details Name: Kimberly Howell MRN: [id number] DOB: 02-10-1946   Cancelled treatment:       Reason Eval/Treat Not Completed: Other (comment). Discussed pt with RT and RN. Not tolerating changes in vent settings today. Still encephalopathic. Will f/u for readiness for inline PMS[facility].    [name], Katherene Ponto 03/20/2020, 11:37 AM

## 2020-03-20 NOTE — Progress Notes (Signed)
Nutrition Follow-up  DOCUMENTATION CODES:   Morbid obesity  INTERVENTION:   Continue tube feeds via PEG: - Vital AF 1.2 @ 60 ml/hr (1440 ml/day) - ProSource TF 45 ml daily - Free water per CCM, currently 200 ml q 4 hours  Tube feeding regimen provides 1768 kcal, 119 grams of protein, and 1168 ml of H2O.  Total free water: 2368 ml  NUTRITION DIAGNOSIS:   Inadequate oral intake related to acute illness as evidenced by NPO status.  Ongoing  GOAL:   Patient will meet greater than or equal to 90% of their needs  Met via TF  MONITOR:   Vent status, TF tolerance, Labs, Weight trends  REASON FOR ASSESSMENT:   Consult Enteral/tube feeding initiation and management  ASSESSMENT:   74 yo female admitted post cardiac arrest requiring 45-50 minutes of CPR before ROSC achieved followed by loss of pulse 3 more times; pt intubated and sedated; also with periorbital fracture, AKI.  PMH includes COPD on home oxygen, stage IV CKD, HTN, morbid obesity  9/28 - trach 9/29 - PEG  Discussed pt with RN and during ICU rounds. No issues with tube feeds at this time per RN. Will continue with current regimen.  Pt's sister at bedside denies having any nutrition-related questions at this time.  Admit weight: 140.8 kg Current weight: 132 kg  Pt with +1 pitting edema to BUE and BLE.  Patient is on ventilator support via trach MV: 11.4 L/min Temp (24hrs), Avg:99.3 F (37.4 C), Min:99.1 F (37.3 C), Max:99.7 F (37.6 C)  Medications reviewed and include: lasix, SSI q 4 hours, levemir 8 units daily, protonix  Labs reviewed: BUN 54, creatinine 1.77, hemoglobin 9.5 CBG's: 158-213 x 24 hours  UOP: 1655 ml x 24 hours Stool: 100 ml x 24 hours I/O's: -646 ml since admit  Diet Order:   Diet Order    None      EDUCATION NEEDS:   Not appropriate for education at this time  Skin:  Skin Assessment: Reviewed RN Assessment (MASD to pelvis)  Last BM:  03/19/20 rectal tube  Height:    Ht Readings from Last 1 Encounters:  03/08/20 _0  (1.727 m)    Weight:   Wt Readings from Last 1 Encounters:  03/20/20 132 kg    Ideal Body Weight:  54.5 kg  BMI:  Body mass index is 44.25 kg/m.  Estimated Nutritional Needs:   Kcal:  5498-2641  Protein:  110-135 grams  Fluid:  >/= 1.5 L    Gaynell Face, MS, RD, LDN Inpatient Clinical Dietitian Please see AMiON for contact information.

## 2020-03-21 DIAGNOSIS — J9601 Acute respiratory failure with hypoxia: Secondary | ICD-10-CM | POA: Diagnosis not present

## 2020-03-21 DIAGNOSIS — Z9911 Dependence on respirator [ventilator] status: Secondary | ICD-10-CM | POA: Diagnosis not present

## 2020-03-21 DIAGNOSIS — G931 Anoxic brain damage, not elsewhere classified: Secondary | ICD-10-CM | POA: Diagnosis not present

## 2020-03-21 LAB — VALPROIC ACID LEVEL: Valproic Acid Lvl: 46 ug/mL — ABNORMAL LOW (ref 50.0–100.0)

## 2020-03-21 LAB — BASIC METABOLIC PANEL
Anion gap: 14 (ref 5–15)
BUN: 59 mg/dL — ABNORMAL HIGH (ref 8–23)
CO2: 19 mmol/L — ABNORMAL LOW (ref 22–32)
Calcium: 8.6 mg/dL — ABNORMAL LOW (ref 8.9–10.3)
Chloride: 103 mmol/L (ref 98–111)
Creatinine, Ser: 1.87 mg/dL — ABNORMAL HIGH (ref 0.44–1.00)
GFR calc non Af Amer: 27 mL/min — ABNORMAL LOW (ref >60)
Glucose, Bld: 209 mg/dL — ABNORMAL HIGH (ref 70–99)
Potassium: 4 mmol/L (ref 3.5–5.1)
Sodium: 136 mmol/L (ref 135–145)

## 2020-03-21 LAB — CBC
HCT: 28.9 % — ABNORMAL LOW (ref 36.0–46.0)
Hemoglobin: 9.1 g/dL — ABNORMAL LOW (ref 12.0–15.0)
MCH: 30.1 pg (ref 26.0–34.0)
MCHC: 31.5 g/dL (ref 30.0–36.0)
MCV: 95.7 fL (ref 80.0–100.0)
Platelets: 266 10*3/uL (ref 150–400)
RBC: 3.02 MIL/uL — ABNORMAL LOW (ref 3.87–5.11)
RDW: 15.5 % (ref 11.5–15.5)
WBC: 9.6 10*3/uL (ref 4.0–10.5)
nRBC: 0 % (ref 0.0–0.2)

## 2020-03-21 LAB — GLUCOSE, CAPILLARY
Glucose-Capillary: 159 mg/dL — ABNORMAL HIGH (ref 70–99)
Glucose-Capillary: 177 mg/dL — ABNORMAL HIGH (ref 70–99)
Glucose-Capillary: 185 mg/dL — ABNORMAL HIGH (ref 70–99)
Glucose-Capillary: 186 mg/dL — ABNORMAL HIGH (ref 70–99)
Glucose-Capillary: 189 mg/dL — ABNORMAL HIGH (ref 70–99)
Glucose-Capillary: 206 mg/dL — ABNORMAL HIGH (ref 70–99)

## 2020-03-21 MED ORDER — PANTOPRAZOLE SODIUM 40 MG PO PACK
40.0000 mg | PACK | Freq: Every day | ORAL | Status: DC
  Administered 2020-03-21 – 2020-04-10 (×21): 40 mg
  Filled 2020-03-21 (×21): qty 20

## 2020-03-21 MED ORDER — VALPROIC ACID 250 MG/5ML PO SOLN
1250.0000 mg | Freq: Three times a day (TID) | ORAL | Status: DC
  Administered 2020-03-21 – 2020-03-26 (×17): 1250 mg
  Filled 2020-03-21 (×22): qty 25

## 2020-03-21 MED ORDER — CLONAZEPAM 0.5 MG PO TBDP
1.0000 mg | ORAL_TABLET | Freq: Once | ORAL | Status: AC
  Administered 2020-03-21: 1 mg
  Filled 2020-03-21: qty 2

## 2020-03-21 MED ORDER — CLONAZEPAM 0.1 MG/ML ORAL SUSPENSION: 1.0000 mg | Freq: Two times a day (BID) | ORAL | Status: DC

## 2020-03-21 MED ORDER — CLONAZEPAM 0.5 MG PO TBDP
1.0000 mg | ORAL_TABLET | Freq: Two times a day (BID) | ORAL | Status: DC
  Administered 2020-03-21 – 2020-03-25 (×8): 1 mg
  Filled 2020-03-21 (×8): qty 2

## 2020-03-21 NOTE — TOC Progression Note (Signed)
Transition of Care [facility]) - Progression Note    Patient Details  Name: Kimberly Howell MRN: [id number] Date of Birth: Oct 18, 1945  Transition of Care [facility]) CM/SW [place], [place] Phone Number: 03/21/2020, 11:46 AM  Clinical Narrative:     CSW spoke with [name] with [facility]. Referral is still being reviewed for patient. [name] with [facility] requested for patient to have medicaid. Referral is pending. CSW will follow up with [name] with medicaid status for patient.CSW called patients son [name] to ask if patient has Medicaid. Patients son wanted his aunt patients sister [name] to call me to give me this answer. Patients son says if patient doesn't he gives permission for CSW to reach out to financial counseling for patient to be screened for Medicaid. Patients sister [name] called CSW and said patient does not have Medicaid.[name] agreed that she wants CSW to reach out to financial counseling to screen patient for medicaid. CSW let patients sister know that she will reach out to financial counseling and follow up with her and patients son [name]. CSW called [name] in financial counseling and no answer. CSW left a voicemail. CSW awaiting call back.    Expected Discharge Plan: Skilled Nursing Facility Barriers to Discharge: Continued Medical Work up  Expected Discharge Plan and Services Expected Discharge Plan: [place]   Discharge Planning Services: CM Consult Post Acute Care Choice: Long Term Acute Care (LTAC) Living arrangements for the past 2 months: Single Family Home                                       Social Determinants of Health (SDOH) Interventions    Readmission Risk Interventions Readmission Risk Prevention Plan 03/14/2020  Transportation Screening Complete  PCP or Specialist Appt within 5-7 Days Complete  Home Care Screening Complete  Medication Review (RN CM) Complete

## 2020-03-21 NOTE — Progress Notes (Signed)
RT note-Patient weaned on 40% ATC for 8 hours today, tolerated well and then placed back to PSV for 2 more hours. Currently placed back to full support, RN aware.

## 2020-03-21 NOTE — Progress Notes (Addendum)
Subjective: Nursing states no gross muscle twitching this morning. She has been moving her left arm on occasion. Unclear if she is completely tracking visually or if EOM are actually roving; she has consistently been looking toward voice.   Objective: Current vital signs: BP 106/64   Pulse 90   Temp 99.3 F (37.4 C) (Oral)   Resp (!) 24   Ht _0  (1.727 m)   Wt 130.8 kg   SpO2 97%   BMI 43.85 kg/m  Vital signs in last 24 hours: Temp:  [99.3 F (37.4 C)-99.7 F (37.6 C)] 99.3 F (37.4 C) (10/06 0400) Pulse Rate:  [75-100] 90 (10/06 0700) Resp:  [20-33] 24 (10/06 0700) BP: (105-163)/(57-120) 106/64 (10/06 0700) SpO2:  [96 %-100 %] 97 % (10/06 0700) FiO2 (%):  [30 %] 30 % (10/06 0400) Weight:  [130.8 kg] 130.8 kg (10/06 0329)  Intake/Output from previous day: 10/05 0701 - 10/06 0700 In: 2052 [QP/RF:1638; IV Piggyback:10] Out: 4400 [Urine:3200; Stool:1200] Intake/Output this shift: No intake/output data recorded. Nutritional status:  Diet Order    None      Neurologic Exam:  Mentation: Non-verbal. Eyes opening and localizing to voice and name about 50% of the time. She initiates visual tracking when examiner whom she is looking towards starts to move, but does not continue tracking as examiner continues to move.  CN:  II, III, IV, VI: Clenches eyes tightly shut when examiner attempts to elicit pupillary light reflexes. Eyelid twitching occurs intermittently and non-rhythmically. Eye twitching increases with stimulation but improved from yesterday.  V, VII: Increased facial twitching to stimulation. When not aroused, facial twitching decreases significantly in amplitude and frequency and is overall decreased compared to yesterday Motor/Sensory:Occasional full body twitching which is of low amplitude. Grimaces to pain with pinch to UE bilaterally and also with increase in myoclonus. No withdrawal to noxious stimuli. Tone decreased x4. Normal babinski.   Lab  Results: Results for orders placed or performed during the hospital encounter of 03/03/20 (from the past 48 hour(s))  Glucose, capillary     Status: Abnormal   Collection Time: 03/19/20  8:47 AM  Result Value Ref Range   Glucose-Capillary 159 (H) 70 - 99 mg/dL    Comment: Glucose reference range applies only to samples taken after fasting for at least 8 hours.  I-STAT 7, (LYTES, BLD GAS, ICA, H+H)     Status: Abnormal   Collection Time: 03/19/20 10:32 AM  Result Value Ref Range   pH, Arterial 7.399 7.35 - 7.45   pCO2 arterial 36.7 32 - 48 mmHg   pO2, Arterial 92 83 - 108 mmHg   Bicarbonate 22.6 20.0 - 28.0 mmol/L   TCO2 24 22 - 32 mmol/L   O2 Saturation 97.0 %   Acid-base deficit 2.0 0.0 - 2.0 mmol/L   Sodium 141 135 - 145 mmol/L   Potassium 4.3 3.5 - 5.1 mmol/L   Calcium, Ion 1.26 1.15 - 1.40 mmol/L   HCT 29.0 (L) 36 - 46 %   Hemoglobin 9.9 (L) 12.0 - 15.0 g/dL   Patient temperature 99.3 F    Collection site Radial    Drawn by RT    Sample type ARTERIAL   Glucose, capillary     Status: Abnormal   Collection Time: 03/19/20 11:50 AM  Result Value Ref Range   Glucose-Capillary 156 (H) 70 - 99 mg/dL    Comment: Glucose reference range applies only to samples taken after fasting for at least 8 hours.  Glucose, capillary  Status: Abnormal   Collection Time: 03/19/20  3:50 PM  Result Value Ref Range   Glucose-Capillary 195 (H) 70 - 99 mg/dL    Comment: Glucose reference range applies only to samples taken after fasting for at least 8 hours.  Glucose, capillary     Status: Abnormal   Collection Time: 03/19/20  8:39 PM  Result Value Ref Range   Glucose-Capillary 169 (H) 70 - 99 mg/dL    Comment: Glucose reference range applies only to samples taken after fasting for at least 8 hours.  Glucose, capillary     Status: Abnormal   Collection Time: 03/20/20 12:47 AM  Result Value Ref Range   Glucose-Capillary 158 (H) 70 - 99 mg/dL    Comment: Glucose reference range applies only to  samples taken after fasting for at least 8 hours.  Basic metabolic panel     Status: Abnormal   Collection Time: 03/20/20  1:02 AM  Result Value Ref Range   Sodium 139 135 - 145 mmol/L   Potassium 4.0 3.5 - 5.1 mmol/L   Chloride 104 98 - 111 mmol/L   CO2 20 (L) 22 - 32 mmol/L   Glucose, Bld 179 (H) 70 - 99 mg/dL    Comment: Glucose reference range applies only to samples taken after fasting for at least 8 hours.   BUN 54 (H) 8 - 23 mg/dL   Creatinine, Ser 1.77 (H) 0.44 - 1.00 mg/dL   Calcium 9.0 8.9 - 10.3 mg/dL   GFR calc non Af Amer 28 (L) >60 mL/min   GFR calc Af Amer 33 (L) >60 mL/min   Anion gap 15 5 - 15    Comment: Performed at [facility] [address] [place], [place] [address]  CBC     Status: Abnormal   Collection Time: 03/20/20  1:02 AM  Result Value Ref Range   WBC 11.3 (H) 4.0 - 10.5 K/uL   RBC 3.22 (L) 3.87 - 5.11 MIL/uL   Hemoglobin 9.5 (L) 12.0 - 15.0 g/dL   HCT 30.6 (L) 36 - 46 %   MCV 95.0 80.0 - 100.0 fL   MCH 29.5 26.0 - 34.0 pg   MCHC 31.0 30.0 - 36.0 g/dL   RDW 15.2 11.5 - 15.5 %   Platelets 287 150 - 400 K/uL   nRBC 0.0 0.0 - 0.2 %    Comment: Performed at [facility], [place] [address] [place], [place] [address]  Glucose, capillary     Status: Abnormal   Collection Time: 03/20/20  3:58 AM  Result Value Ref Range   Glucose-Capillary 213 (H) 70 - 99 mg/dL    Comment: Glucose reference range applies only to samples taken after fasting for at least 8 hours.  Glucose, capillary     Status: Abnormal   Collection Time: 03/20/20  8:07 AM  Result Value Ref Range   Glucose-Capillary 191 (H) 70 - 99 mg/dL    Comment: Glucose reference range applies only to samples taken after fasting for at least 8 hours.  Glucose, capillary     Status: Abnormal   Collection Time: 03/20/20 11:30 AM  Result Value Ref Range   Glucose-Capillary 198 (H) 70 - 99 mg/dL    Comment: Glucose reference range applies only to samples taken after fasting for at least 8  hours.  Glucose, capillary     Status: Abnormal   Collection Time: 03/20/20  3:32 PM  Result Value Ref Range   Glucose-Capillary 184 (H)  70 - 99 mg/dL    Comment: Glucose reference range applies only to samples taken after fasting for at least 8 hours.  Glucose, capillary     Status: Abnormal   Collection Time: 03/20/20  8:27 PM  Result Value Ref Range   Glucose-Capillary 165 (H) 70 - 99 mg/dL    Comment: Glucose reference range applies only to samples taken after fasting for at least 8 hours.  Basic metabolic panel     Status: Abnormal   Collection Time: 03/21/20 12:26 AM  Result Value Ref Range   Sodium 136 135 - 145 mmol/L   Potassium 4.0 3.5 - 5.1 mmol/L   Chloride 103 98 - 111 mmol/L   CO2 19 (L) 22 - 32 mmol/L   Glucose, Bld 209 (H) 70 - 99 mg/dL    Comment: Glucose reference range applies only to samples taken after fasting for at least 8 hours.   BUN 59 (H) 8 - 23 mg/dL   Creatinine, Ser 1.87 (H) 0.44 - 1.00 mg/dL   Calcium 8.6 (L) 8.9 - 10.3 mg/dL   GFR calc non Af Amer 27 (L) >60 mL/min   Anion gap 14 5 - 15    Comment: Performed at [facility] [address] [place], [place] [address]  CBC     Status: Abnormal   Collection Time: 03/21/20 12:26 AM  Result Value Ref Range   WBC 9.6 4.0 - 10.5 K/uL   RBC 3.02 (L) 3.87 - 5.11 MIL/uL   Hemoglobin 9.1 (L) 12.0 - 15.0 g/dL   HCT 28.9 (L) 36 - 46 %   MCV 95.7 80.0 - 100.0 fL   MCH 30.1 26.0 - 34.0 pg   MCHC 31.5 30.0 - 36.0 g/dL   RDW 15.5 11.5 - 15.5 %   Platelets 266 150 - 400 K/uL   nRBC 0.0 0.0 - 0.2 %    Comment: Performed at [facility], [place] [address], [place], [place] [address]  Glucose, capillary     Status: Abnormal   Collection Time: 03/21/20 12:32 AM  Result Value Ref Range   Glucose-Capillary 206 (H) 70 - 99 mg/dL    Comment: Glucose reference range applies only to samples taken after fasting for at least 8 hours.  Glucose, capillary     Status: Abnormal   Collection Time: 03/21/20  4:19  AM  Result Value Ref Range   Glucose-Capillary 159 (H) 70 - 99 mg/dL    Comment: Glucose reference range applies only to samples taken after fasting for at least 8 hours.    Recent Results (from the past 240 hour(s))  Culture, Urine     Status: None   Collection Time: 03/12/20  3:57 PM   Specimen: Urine, Catheterized  Result Value Ref Range Status   Specimen Description URINE, CATHETERIZED  Final   Special Requests NONE  Final   Culture   Final    NO GROWTH Performed at [facility], [address], [place], [place] [address]    Report Status 03/13/2020 FINAL  Final  Culture, blood (routine x 2)     Status: None   Collection Time: 03/12/20  4:24 PM   Specimen: BLOOD LEFT ARM  Result Value Ref Range Status   Specimen Description BLOOD LEFT ARM  Final   Special Requests   Final    BOTTLES DRAWN AEROBIC AND ANAEROBIC Blood Culture adequate volume   Culture   Final    NO GROWTH 5 DAYS  Performed at [facility], [place] [address] [place], [place] [address]    Report Status 03/17/2020 FINAL  Final  Culture, blood (routine x 2)     Status: None   Collection Time: 03/12/20  4:36 PM   Specimen: BLOOD LEFT ARM  Result Value Ref Range Status   Specimen Description BLOOD LEFT ARM  Final   Special Requests   Final    BOTTLES DRAWN AEROBIC AND ANAEROBIC Blood Culture adequate volume   Culture   Final    NO GROWTH 5 DAYS Performed at [facility], [place] [address], [place], [place] [address]    Report Status 03/17/2020 FINAL  Final  CSF culture     Status: None   Collection Time: 03/12/20  4:59 PM   Specimen: CSF; Cerebrospinal Fluid  Result Value Ref Range Status   Specimen Description CSF  Final   Special Requests Normal  Final   Gram Stain   Final    WBC PRESENT,BOTH PMN AND MONONUCLEAR NO ORGANISMS SEEN CYTOSPIN SMEAR    Culture   Final    NO GROWTH 3 DAYS Performed at [facility], [place] [address] [place], [place] [address]    Report Status  03/16/2020 FINAL  Final  Culture, respiratory     Status: None   Collection Time: 03/13/20  9:17 AM   Specimen: Bronchoalveolar Lavage; Respiratory  Result Value Ref Range Status   Specimen Description BRONCHIAL ALVEOLAR LAVAGE  Final   Special Requests NONE  Final   Gram Stain   Final    FEW WBC PRESENT, PREDOMINANTLY MONONUCLEAR NO ORGANISMS SEEN    Culture   Final    NO GROWTH 2 DAYS Performed at [facility], [address] [place], [place] [address]    Report Status 03/15/2020 FINAL  Final  Culture, respiratory (non-expectorated)     Status: None   Collection Time: 03/17/20  3:07 PM   Specimen: Tracheal Aspirate; Respiratory  Result Value Ref Range Status   Specimen Description TRACHEAL ASPIRATE  Final   Special Requests NONE  Final   Gram Stain   Final    NO WBC SEEN NO ORGANISMS SEEN Performed at [facility], [address], [place], [place] [address]    Culture RARE CANDIDA ALBICANS  Final   Report Status 03/20/2020 FINAL  Final    Lipid Panel No results for input(s): CHOL, TRIG, HDL, CHOLHDL, VLDL, LDLCALC in the last 72 hours.  Studies/Results: EEG  Result Date: 03/19/2020 Lora Havens, MD     03/19/2020  5:42 PM Patient Name: Kimberly Howell MRN: [id number] Epilepsy Attending: Lora Havens Referring Physician/Provider: Dr Molli Hazard Date: 03/19/2020 Duration: 31.31 mins Patient history: 73y m s/p cardiac arrest with >45 min before ROSC. EEG to evaluate for seizure. Level of alertness: awake AEDs during EEG study: None Technical aspects: This EEG study was done with scalp electrodes positioned according to the 10-20 International system of electrode placement. Electrical activity was acquired at a sampling rate of _0  and reviewed with a high frequency filter of _1  and a low frequency filter of _2 . EEG data were recorded continuously and digitally stored. Description: No posterior dominant rhythm was seen. EEG showed continuous generalized  low amplitude 3 to 6 Hz theta-delta slowing. Frequent episodes of facial twitching were noted. Concomitant eeg before, during and after the events didn't show eeg change to suggest seizure.  Hyperventilation and photic stimulation were not performed.  ABNORMALITY - Continuous slow, generalized IMPRESSION: This study is suggestive of moderate diffuse encephalopathy, nonspecific etiology. No definite seizures or epileptiform discharges were seen throughout the recording. Patient was noted to have episodes of facial twitching without concomitant eeg change. However focal motor seizures may not be seen on scalp eeg. Therefore, clinical correlation is recommended. Myoclonus is also in the differential. Dr Cheral Marker was notified. Priyanka Barbra Sarks    Medications:  Scheduled: . chlorhexidine  15 mL Mouth Rinse BID  . Chlorhexidine Gluconate Cloth  6 each Topical Daily  . feeding supplement (PROSource TF)  45 mL Per Tube Daily  . free water  200 mL Per Tube Q4H  . furosemide  40 mg Intravenous Daily  . heparin  5,000 Units Subcutaneous Q8H  . influenza vaccine adjuvanted  0.5 mL Intramuscular Tomorrow-1000  . insulin aspart  0-15 Units Subcutaneous Q4H  . insulin detemir  8 Units Subcutaneous Daily  . mouth rinse  15 mL Mouth Rinse q12n4p  . melatonin  3 mg Per Tube QHS  . pantoprazole (PROTONIX) IV  40 mg Intravenous QHS  . pneumococcal 23 valent vaccine  0.5 mL Intramuscular Tomorrow-1000  . sodium chloride flush  10-40 mL Intracatheter Q12H  . valproic acid  5 mg/kg Oral Q8H   Continuous: . sodium chloride Stopped (03/06/20 1523)  . cefTRIAXone (ROCEPHIN)  IV 2 g (03/20/20 2204)  . feeding supplement (VITAL AF 1.2 CAL) 1,000 mL (03/20/20 2257)    Assessment:14 year oldfemale with TIIDM, CKD, morbid obesity, COPD and chronic hypoxic respiratory failure on supplemental O2 admitted after collapsing at home with cardiac arrest, undergoing 45 minutes of CPR with ROSC. MRI on9/26showed symmetric  DWI abnormality suggestive ofanoxic brain injury within the medial temporal lobes and cerebellum with possible punctate acute infarct of paramedian right frontal lobe. --Patient has shown littleclinicalimprovementin terms of her level of consciousness during herhospital course. She developed worseningfacial, body, and extremity twitching yesterday after sedation was decreased. Twitching was also seen about one week ago. Neurologic status appears slightly improved today with localization to voice and frequency and amplitude of facial twitching has further decreased.  -- Spot EEG on Monday (10/4) revealed generalized continuous slowing. Thestudy was suggestive of moderate diffuse encephalopathy, nonspecifictoetiology.Nodefiniteseizures or epileptiform discharges were seen throughout the recording.Of note, the patient was noted to have episodes of facial twitching without concomitantEEGchange. However,focal motor seizures may not be seen on scalp eeg. Therefore, clinical correlation was recommended. EEG-negative myoclonus is also in the differential. -- Depakote was started on Monday (10/4) and recommendation to increase was made yesterday (10/5), but order for increase was not entered. VPA level this AM is subtherapeutic at 46.   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 It may not resolve completely with anticonvulsant therapy.  - Mentation slightly improved today as she does localize to voice and her name, possibly secondary to improvement of her myclonus.  - Long-term prognosis for meaningful neurological recovery is somewhat improving given current progress. However, prognosis remains guarded due to slow and relatively minor improvement over time following her cardiac arrest which was a) prolonged, b) occurring two weeks ago and c) with MRI findings of anoxic injury.   - Family wishes are  for maximum medical therapy and eventual discharge to SNF.    LOS: 18 days   Participating in performance of exam: Marty Heck, DO 03/21/2020, 9:46 AM Pager: [phone]  02  minutes spent in the neurological evaluation and management of this critically ill patient.   Electronically signed: Dr. Kerney Elbe

## 2020-03-21 NOTE — Progress Notes (Signed)
Occupational Therapy Treatment Patient Details Name: Kimberly Howell MRN: [id number] DOB: 1945-12-20 Today's Date: 03/21/2020    History of present illness 74 yo admitted with cardiac arrest and fall on 9/18 s/p CPR with resultant left orbit fx. Intubated 9/18, trach 9/ 28, PEG 9/29. PMhx: morbid obesity, HTN, respiratory failure on 3L, CKD stage 4   OT comments  Pt with increased lethargy. Not following commands this date, but opened her eyes when her name called and nodded her head slightly when asked if it hurt when her blood was drawn during session. Performed PROM B UE all areas, no attempt to actively move UEs. Pt grimacing with ROM of shoulders which was apparent last visit as well. No attempts to mouth words today. Will continue efforts.  Follow Up Recommendations  LTACH;Supervision/Assistance - 24 hour    Equipment Recommendations   (defer to next venue)    Recommendations for Other Services      Precautions / Restrictions Precautions Precautions: Fall;Other (comment) Precaution Comments: trach, vent, PEG  Restrictions Weight Bearing Restrictions: No       Mobility Bed Mobility                  Transfers                      Balance                                           ADL either performed or assessed with clinical judgement   ADL                                         General ADL Comments: dependent in all aspects     Vision       Perception     Praxis      Cognition Arousal/Alertness: Lethargic Behavior During Therapy: Flat affect Overall Cognitive Status: Impaired/Different from baseline                     Current Attention Level: Focused           General Comments: pt not following commands, but did slightly nod her head yes x 1        Exercises General Exercises - Upper Extremity Shoulder Flexion: PROM;Both;5 reps;Supine Elbow Flexion: AAROM;Both;5 reps;Supine Elbow  Extension: AAROM;Both;5 reps;Supine Wrist Flexion: PROM;Both;5 reps;Supine Wrist Extension: PROM;Both;5 reps;Supine Digit Composite Flexion: PROM;Both;5 reps;Supine Composite Extension: PROM;Both;5 reps;Supine   Shoulder Instructions       General Comments      Pertinent Vitals/ Pain       Pain Assessment: Faces Faces Pain Scale: Hurts little more Pain Location: shoulders Pain Descriptors / Indicators: Grimacing Pain Intervention(s): Monitored during session;Repositioned  Home Living                                          Prior Functioning/Environment              Frequency  Min 2X/week        Progress Toward Goals  OT Goals(current goals can now be found in the care plan section)  Progress towards OT goals: Not progressing toward  goals - comment (lethargy)  Acute Rehab OT Goals OT Goal Formulation: Patient unable to participate in goal setting Time For Goal Achievement: 03/28/20 Potential to Achieve Goals: [place] Discharge plan remains appropriate    Co-evaluation                 AM-PAC OT "6 Clicks" Daily Activity     Outcome Measure   Help from another person eating meals?: Total Help from another person taking care of personal grooming?: Total Help from another person toileting, which includes using toliet, bedpan, or urinal?: Total Help from another person bathing (including washing, rinsing, drying)?: Total Help from another person to put on and taking off regular upper body clothing?: Total Help from another person to put on and taking off regular lower body clothing?: Total 6 Click Score: 6    End of Session    OT Visit Diagnosis: Muscle weakness (generalized) (M62.81);Pain;History of falling (Z91.81);Other symptoms and signs involving cognitive function   Activity Tolerance Patient limited by lethargy   Patient Left in bed;with call bell/phone within reach   Nurse Communication Other (comment) (pt's behavior  compared to previous visit)        Time: 1030-1045 OT Time Calculation (min): 15 min  Charges: OT General Charges $OT Visit: 1 Visit OT Treatments $Therapeutic Activity: 8-22 mins  Nestor Lewandowsky, OTR/L Acute Rehabilitation Services Pager: [phone] Office: [phone]  Kimberly Howell 03/21/2020, 11:58 AM

## 2020-03-21 NOTE — Plan of Care (Signed)
  Problem: Clinical Measurements: Goal: Ability to maintain clinical measurements within normal limits will improve Outcome: Progressing Goal: Will remain free from infection Outcome: Progressing Goal: Diagnostic test results will improve Outcome: Progressing Goal: Respiratory complications will improve Outcome: Progressing Goal: Cardiovascular complication will be avoided Outcome: Progressing   Problem: Activity: Goal: Risk for activity intolerance will decrease Outcome: Progressing   Problem: Nutrition: Goal: Adequate nutrition will be maintained Outcome: Progressing   Problem: Coping: Goal: Level of anxiety will decrease Outcome: Progressing   Problem: Elimination: Goal: Will not experience complications related to urinary retention Outcome: Progressing   Problem: Pain Managment: Goal: General experience of comfort will improve Outcome: Progressing   Problem: Safety: Goal: Ability to remain free from injury will improve Outcome: Progressing   Problem: Skin Integrity: Goal: Risk for impaired skin integrity will decrease Outcome: Progressing   Problem: Activity: Goal: Ability to tolerate increased activity will improve Outcome: Progressing   Problem: Respiratory: Goal: Ability to maintain a clear airway and adequate ventilation will improve Outcome: Progressing   Problem: Health Behavior/Discharge Planning: Goal: Ability to manage health-related needs will improve Outcome: Not Progressing   Problem: Elimination: Goal: Will not experience complications related to bowel motility Outcome: Not Progressing

## 2020-03-21 NOTE — Progress Notes (Signed)
NAME:  Kimberly Howell, MRN:  [id number], DOB:  09/10/1945, LOS: 43 ADMISSION DATE:  03/03/2020, CONSULTATION DATE:  03/21/20 REFERRING MD:  EDP, CHIEF COMPLAINT:  Cardiac arrest   Brief History   74 year old female with PMH chronic respiratory failure with COPD on 3 L home O2 and CPAP nightly, stage IV CKD, hypertension, morbid obesity who had a cardiac arrest with >45 minutes CPR before ROSC then lost pulses three more times.  Intubated and PCCM consulted for admission  Past Medical History  Morbid obesity with BMI of 50.0-59.9, adult ([place]) Chronic respiratory failure (HCC) CKD (chronic kidney disease) stage 3, GFR 30-59 ml/min (HCC) Essential hypertension OSA on CPAP  Significant Hospital Events   9/18 Admit to PCCM 9/28 Trach  10/4 Started Depakote   Consults:  Ophthamology ENT Neurology Palliative  Procedures:  9/18 ETT >> 9/28 9/21 CVC 3L >> to be taken out 9/29 post PEG 9/27 LP  9/28 Tracheostomy  9/29 IR PEG   Significant Diagnostic Tests:  CT head/C-spine/Maxillofacial >> Small left frontal scalp hematoma without skull fracture. Fracture of the left lamina papyracea with herniation of the left medial rectus muscle into the ethmoid sinus. Small amount of retro bulbar stranding, likely hemorrhage.  Right lung apex consolidation concerning for pneumonia. TTE 9/18 >> LVEF 60-65%; no regional wall abnormalities; G1DD, normal RV EEG 9/20 >> diffuse encephalopathy MRI brain 9/24 >> Findings are highly suspicious for hypoxic/ischemic injury. Two punctate acute infarcts within the paramedian right frontal lobe are difficult to exclude. Medially displaced acute fracture of the left lamina papyracea. There is also an acute, displaced fracture of the left fovea ethmoidalis. Orbital fat and the medial rectus muscle extend into the left ethmoid sinus. Additionally, orbital fat extends from the left ethmoid sinus through the fracture defect in the fovea ethmoidalis intracranially into  the left anterior cranial fossa. The intracranial component of orbital fat measures 1 cm and exerts mild local mass effect upon the anteroinferior left frontal lobe.   Micro Data:  9/18 Sars-CoV-2>>negative 9/18 MRSA >> positive 9/27 CSF culture >> negative 9/27 UC >> negative 9/27 BC x 2 >> negative 9/28 BAL >> negative 10/2 Tracheal aspirate >>  10/2 UA >> negative   Antimicrobials:  Unasyn 9/19 >> 9/25 Vancomycin >> 9/27 (1 dose) Ceftriaxone 9/27 >> 9/29 Cefepime 10/2 >>  Vanco 10/2 >>10  Interim history/subjective:  Tmax 99.7 / WBC 11.3 On vent 35%, PEEP 5,   CBGS 213, 158 I/O UOP 1.7cc  Net -739.9  Objective   Blood pressure (!) 143/90, pulse 91, temperature 99 F (37.2 C), temperature source Oral, resp. rate (!) 30, height _0  (1.727 m), weight 130.8 kg, SpO2 99 %.    Vent Mode: PRVC FiO2 (%):  [30 %-40 %] 40 % Set Rate:  [22 bmp-26 bmp] 22 bmp Vt Set:  [380 mL] 380 mL PEEP:  [5 cmH20] 5 cmH20 Plateau Pressure:  [16 cmH20-20 cmH20] 20 cmH20   Intake/Output Summary (Last 24 hours) at 03/21/2020 1101 Last data filed at 03/21/2020 1000 Gross per 24 hour  Intake 2137 ml  Output 4750 ml  Net -2613 ml   Filed Weights   03/19/20 0500 03/20/20 0500 03/21/20 0329  Weight: 132 kg 132 kg 130.8 kg   EXAM General: unwell appearing 74 yr old female lying in bed, sleeping HEENT: no scleral icterus, hematoma over left forehead with dried blood, midline tracheostomy site dry and clean  Neuro: global facial twitching when awake, minimally responsive to words, positive corneal,  gag and cough reflex, does not withdraw to pain in UE, withdraws to pain in LE CV: s1, s2, RRR, no murmurs PULM: no respiratory distress, few crackles bilaterally GI: soft, non distended, bowel sounds present  Extremities: mild peripheral edema Skin: no rashes, warm and dry   Resolved Problem List:  AKI on CKD- now close to euvolemic and new baseline Cr Hypernatremia   Assessment & Plan:    Cardiac Arrest with Hypoxic and Hypercarbic Respiratory Failure Patient with prolonged downtime  > 45 minutes. Presumed PEA cardiac arrest. Had 4 separate events with ROSC obtained. EKG without signs of ischemia.  Slow neurological recovery, family [place] 9/27 agreed for [facility] placement. Pt taken off the vent this morning and doing well on trach collar. Trach on 9/28.  -Continuous pulse oximetry -O2 goal >90% -Respiratory therapy -Care management working toward [facility] placement  -On going PT    R sided Pneumonia Probable aspiration during cardiac event.  S/p unasyn x 7 days. s/p ceftriaxone 9/27, s/p cefazolin, f/u BAL and culture data but suspect noninfectious. Cefepime (10/1-10/4), Vancomycin (10/1-10/4), Ceftriaxone (10/4-) Tracheal aspirate 10/02: no growth. 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 These tremors were not noticeable when patient was sedated lst week but pt developed global facial twitching a few days ago. Consulted neurology on 10/4 who did bedside EEG which showed moderate diffuse encephalopathy. Dr Cheral Marker started pt on Depakote 10 mg/kg for anoxic myoclonus. Facial twitching since then has improved and overall improved from yesterday after midazolam. Pt has been lethargic and sleepy over the past 2 days which is a change from last week where she was alert, mouthing words et. Considering hypoactive delirium or other intracranial abnormality causing these symptoms. Will await neuro recs today regarding order head CT. -Neurology following, appreciate recommendations -Depakote increased by neuro today to 15 mg/kg q8h via PEG  -Monitor neurological checks Q4H  Periorbital Fracture with Hyphema  Herniation of the left medial rectus muscle into the sinus with small  amount of retro bulbar stranding.  Ophthalmologist noted patient's EOM intact and no limitations of gaze & no signs of entrapment seen.  Therefore ENT recommends to not repair orbital injury at this time. Neurosurgery consulted 9/28, stating no indication for repair unless clear CSF leak which the patient has no symptoms of. -follow neuro exam  -follow up with outpatient Ophthalmology   Anemia 2/2 chronic disease, critical illness, stable  Hb 9.6, s/p 2 unit RBC on 9/25 and 10/2  -Monitor Hb with CBC   -Transfuse for Hb <7%  Type 2 diabetes CBGs 159, 206 -Continue sliding scale insulin  -Levemir 8 units daly  -CBG goal 140-180  HFpEF Echo Grade 1 diastolic dysfunction -Strict I/O's  -Continue Lasix 40 mg IV once daly   Hypertension SBP 125-143, DBP 60-75 Home medications: Amlodipine, carvedilol, furosemide, lisinopril on hold s/p pressor requirement this admission. EF 60-65% -Strict I/O -Daily BMP -Hold all home antihypertensives   AKI, improving  Cr 1.87 -Trend BMP / urinary output -Replace electrolytes as indicated -Avoid nephrotoxic agents, ensure adequate renal perfusion  Hypernatremia, resolved Na 136 -Continue free water via PEF, 223m Q4  -Monitor with BMP   Constipation -PRN miralax, colace  At Risk Malnutrition  PEG Status  PEG (9/29) -TF per Nutrition   Best practice:  Diet: NPO, TF  Pain/Anxiety/Delirium protocol (  if indicated): Fentanyl IVP PRN VAP protocol (if indicated): in place: HOB 30 degrees, suction as needed DVT prophylaxis: Heparin SQ/ SCDs GI prophylaxis: Protonix Glucose control: SSI & levemir BID Mobility: Bedrest, mobilize as tolerated Code Status: DNR Family Communication: Niece updated at bedside 10/3 on plan of care.  Disposition: ICU  Critical care time:      Lattie Haw MD, PGY2 [place] [facility] 03/21/2020, 11:01 AM   Please see [email or web address] for pager details.

## 2020-03-22 DIAGNOSIS — Z9911 Dependence on respirator [ventilator] status: Secondary | ICD-10-CM | POA: Diagnosis not present

## 2020-03-22 DIAGNOSIS — G931 Anoxic brain damage, not elsewhere classified: Secondary | ICD-10-CM | POA: Diagnosis not present

## 2020-03-22 DIAGNOSIS — J9621 Acute and chronic respiratory failure with hypoxia: Secondary | ICD-10-CM | POA: Diagnosis not present

## 2020-03-22 DIAGNOSIS — G253 Myoclonus: Secondary | ICD-10-CM | POA: Diagnosis not present

## 2020-03-22 DIAGNOSIS — J9622 Acute and chronic respiratory failure with hypercapnia: Secondary | ICD-10-CM | POA: Diagnosis not present

## 2020-03-22 LAB — CBC
HCT: 30.6 % — ABNORMAL LOW (ref 36.0–46.0)
Hemoglobin: 9.3 g/dL — ABNORMAL LOW (ref 12.0–15.0)
MCH: 29.2 pg (ref 26.0–34.0)
MCHC: 30.4 g/dL (ref 30.0–36.0)
MCV: 95.9 fL (ref 80.0–100.0)
Platelets: 259 10*3/uL (ref 150–400)
RBC: 3.19 MIL/uL — ABNORMAL LOW (ref 3.87–5.11)
RDW: 15.6 % — ABNORMAL HIGH (ref 11.5–15.5)
WBC: 9.2 10*3/uL (ref 4.0–10.5)
nRBC: 0 % (ref 0.0–0.2)

## 2020-03-22 LAB — POCT I-STAT 7, (LYTES, BLD GAS, ICA,H+H)
Acid-base deficit: 1 mmol/L (ref 0.0–2.0)
Bicarbonate: 24 mmol/L (ref 20.0–28.0)
Calcium, Ion: 1.2 mmol/L (ref 1.15–1.40)
HCT: 31 % — ABNORMAL LOW (ref 36.0–46.0)
Hemoglobin: 10.5 g/dL — ABNORMAL LOW (ref 12.0–15.0)
O2 Saturation: 97 %
Patient temperature: 98.5
Potassium: 4.3 mmol/L (ref 3.5–5.1)
Sodium: 136 mmol/L (ref 135–145)
TCO2: 25 mmol/L (ref 22–32)
pCO2 arterial: 41 mmHg (ref 32.0–48.0)
pH, Arterial: 7.376 (ref 7.350–7.450)
pO2, Arterial: 88 mmHg (ref 83.0–108.0)

## 2020-03-22 LAB — GLUCOSE, CAPILLARY
Glucose-Capillary: 144 mg/dL — ABNORMAL HIGH (ref 70–99)
Glucose-Capillary: 164 mg/dL — ABNORMAL HIGH (ref 70–99)
Glucose-Capillary: 180 mg/dL — ABNORMAL HIGH (ref 70–99)
Glucose-Capillary: 182 mg/dL — ABNORMAL HIGH (ref 70–99)
Glucose-Capillary: 190 mg/dL — ABNORMAL HIGH (ref 70–99)
Glucose-Capillary: 193 mg/dL — ABNORMAL HIGH (ref 70–99)
Glucose-Capillary: 215 mg/dL — ABNORMAL HIGH (ref 70–99)

## 2020-03-22 LAB — BASIC METABOLIC PANEL
Anion gap: 15 (ref 5–15)
BUN: 65 mg/dL — ABNORMAL HIGH (ref 8–23)
CO2: 19 mmol/L — ABNORMAL LOW (ref 22–32)
Calcium: 8.5 mg/dL — ABNORMAL LOW (ref 8.9–10.3)
Chloride: 103 mmol/L (ref 98–111)
Creatinine, Ser: 1.83 mg/dL — ABNORMAL HIGH (ref 0.44–1.00)
GFR calc non Af Amer: 27 mL/min — ABNORMAL LOW (ref >60)
Glucose, Bld: 205 mg/dL — ABNORMAL HIGH (ref 70–99)
Potassium: 4.1 mmol/L (ref 3.5–5.1)
Sodium: 137 mmol/L (ref 135–145)

## 2020-03-22 LAB — VALPROIC ACID LEVEL: Valproic Acid Lvl: 64 ug/mL (ref 50.0–100.0)

## 2020-03-22 MED ORDER — INSULIN DETEMIR 100 UNIT/ML ~~LOC~~ SOLN
12.0000 [IU] | Freq: Every day | SUBCUTANEOUS | Status: DC
  Administered 2020-03-23 – 2020-03-25 (×3): 12 [IU] via SUBCUTANEOUS
  Filled 2020-03-22 (×3): qty 0.12

## 2020-03-22 NOTE — Progress Notes (Signed)
Placed pt back on vent for the night, pt had increased WOB.

## 2020-03-22 NOTE — TOC Progression Note (Signed)
Transition of Care [facility]) - Progression Note    Patient Details  Name: Kimberly Howell MRN: [id number] Date of Birth: [birth year]  Transition of Care [facility] CM/SW [place], [place] Phone Number: 03/22/2020, 3:17 PM  Clinical Narrative:     CSW spoke with [place] with financial counseling who started a medicaid screening for patient. CSW followed up with medicaid screening. As soon as forms are signed by patients son they will be submitted. CSW called [name] with [facility] and updated her on status of screening. [name] requested the caseworkers name and number when available. [name] also requested a CRE rectal swab and covid before referral can be approved. C[place] in financial counseling will follow up with CSW with caseworker information once received.   Pending Vent/SNF offer.  CSW will continue to follow.  Expected Discharge Plan: Skilled Nursing Facility Barriers to Discharge: Continued Medical Work up  Expected Discharge Plan and Services Expected Discharge Plan: [place]   Discharge Planning Services: CM Consult Post Acute Care Choice: Long Term Acute Care (LTAC) Living arrangements for the past 2 months: Single Family Home                                       Social Determinants of Health (SDOH) Interventions    Readmission Risk Interventions Readmission Risk Prevention Plan 03/14/2020  Transportation Screening Complete  PCP or Specialist Appt within 5-7 Days Complete  Home Care Screening Complete  Medication Review (RN CM) Complete

## 2020-03-22 NOTE — Progress Notes (Signed)
NAME:  Kimberly Howell, MRN:  [id number], DOB:  1946-03-18, LOS: 63 ADMISSION DATE:  03/03/2020, CONSULTATION DATE:  03/22/20 REFERRING MD:  EDP, CHIEF COMPLAINT:  Cardiac arrest   Brief History   74 year old female with PMH chronic respiratory failure with COPD on 3 L home O2 and CPAP nightly, stage IV CKD, hypertension, morbid obesity who had a cardiac arrest with >45 minutes CPR before ROSC then lost pulses three more times.  Intubated and PCCM consulted for admission  Past Medical History  Morbid obesity with BMI of 50.0-59.9, adult ([place]) Chronic respiratory failure (HCC) CKD (chronic kidney disease) stage 3, GFR 30-59 ml/min (HCC) Essential hypertension OSA on CPAP  Significant Hospital Events   9/18 Admit to PCCM 9/28 Trach  10/4 Started Depakote   Consults:  Ophthamology ENT Neurology Palliative  Procedures:  9/18 ETT >> 9/28 9/21 CVC 3L >> to be taken out 9/29 post PEG 9/27 LP  9/28 Tracheostomy  9/29 IR PEG  10/6 Trach collar  Significant Diagnostic Tests:  CT head/C-spine/Maxillofacial >> Small left frontal scalp hematoma without skull fracture. Fracture of the left lamina papyracea with herniation of the left medial rectus muscle into the ethmoid sinus. Small amount of retro bulbar stranding, likely hemorrhage.  Right lung apex consolidation concerning for pneumonia. TTE 9/18 >> LVEF 60-65%; no regional wall abnormalities; G1DD, normal RV EEG 9/20 >> diffuse encephalopathy MRI brain 9/24 >> Findings are highly suspicious for hypoxic/ischemic injury. Two punctate acute infarcts within the paramedian right frontal lobe are difficult to exclude. Medially displaced acute fracture of the left lamina papyracea. There is also an acute, displaced fracture of the left fovea ethmoidalis. Orbital fat and the medial rectus muscle extend into the left ethmoid sinus. Additionally, orbital fat extends from the left ethmoid sinus through the fracture defect in the fovea ethmoidalis  intracranially into the left anterior cranial fossa. The intracranial component of orbital fat measures 1 cm and exerts mild local mass effect upon the anteroinferior left frontal lobe.   Micro Data:  9/18 Sars-CoV-2>>negative 9/18 MRSA >> positive 9/27 CSF culture >> negative 9/27 UC >> negative 9/27 BC x 2 >> negative 9/28 BAL >> negative 10/2 Tracheal aspirate >>  10/2 UA >> negative   Antimicrobials:  Unasyn 9/19 >> 9/25 Vancomycin >> 9/27 (1 dose) Ceftriaxone 9/27 >> 9/29 Cefepime 10/2 >>  Vanco 10/2 >>10  Interim history/subjective:  Tmax 98.8/ WBC 11.3 On trach collar FiO2 35%, 8 L high flow CBGS 182, 144 I/O UOP 1.4 cc Net -1.17 L  Objective   Blood pressure 112/65, pulse 100, temperature 99.1 F (37.3 C), temperature source Oral, resp. rate (!) 27, height _0  (1.727 m), weight 126.5 kg, SpO2 100 %.    Vent Mode: PRVC FiO2 (%):  [30 %-40 %] 35 % Set Rate:  [22 bmp] 22 bmp Vt Set:  [380 mL] 380 mL PEEP:  [5 cmH20] 5 cmH20 Plateau Pressure:  [17 cmH20-18 cmH20] 18 cmH20   Intake/Output Summary (Last 24 hours) at 03/22/2020 0842 Last data filed at 03/22/2020 0700 Gross per 24 hour  Intake 200 ml  Output 1425 ml  Net -1225 ml   Filed Weights   03/20/20 0500 03/21/20 0329 03/22/20 0500  Weight: 132 kg 130.8 kg 126.5 kg   EXAM General: ill appearing 74 year old female, sleeping in bed, arousable at times HEENT: No scleral icterus,hematoma over right forehead present with scabbing of blood neuro: Corneal reflex present, does not withdraw to pain on any of the upper or  lower extremities, PERRLA, opens eyes and responds to her name CV: S1 and S2 present, RRR, no murmurs PULM: Breathing comfortably on trach collar, CTAB, no increased work of breathing GI: Abdomen nondistended, soft nontender Extremities: mild peripheral edema Skin: no rashes, warm and dry   Resolved Problem List:  AKI on CKD- now close to euvolemic and new baseline Cr Hypernatremia    Assessment & Plan:   Cardiac Arrest with Hypoxic and Hypercarbic Respiratory Failure Patient with prolonged downtime  > 45 minutes. Presumed PEA cardiac arrest. Had 4 separate events with ROSC obtained. EKG without signs of ischemia.  Slow neurological recovery, family [place] 9/27 agreed for [facility] placement.  Patient had 8 hours off vent yesterday morning on trach collar  -Continuous pulse oximetry -O2 goal >90% -Respiratory therapy -Care management working toward [facility] placement  -On going PT    R sided Pneumonia, improving Probable aspiration during cardiac event.  S/p unasyn x 7 days. s/p ceftriaxone 9/27, s/p cefazolin, f/u BAL and culture data but suspect noninfectious. Cefepime (10/1-10/4), Vancomycin (10/1-10/4), Ceftriaxone (10/4-) Tracheal aspirate 10/02: no growth. 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  Depakote level 5046 yesterday.  Patient has been more lethargic over the last few days and has been a question of hypoactive delirium.  Neurology did not feel that increasing Depakote has caused this.  Started on Klonopin 1 mg 4 concern for altered tapering off midazolam while on mechanical ventilation last week -Neurology following, appreciate recommendations -Depakote dosing per neurology -Monitor neurological checks Q4H -Klonopin 1 mg twice daily  Periorbital Fracture with Hyphema, stable Herniation of the left medial rectus muscle into the sinus with small amount of retro bulbar stranding.  Ophthalmologist noted patient's EOM intact and no limitations of gaze & no signs of entrapment seen.  Therefore ENT recommends to not repair orbital injury at this time. Neurosurgery consulted 9/28, stating no indication for repair unless clear CSF leak which the patient has no symptoms  of. -follow neuro exam  -follow up with outpatient Ophthalmology   Anemia 2/2 chronic disease, critical illness, stable  Hb 9.3, s/p 2 unit RBC on 9/25 and 10/2  -Monitor Hb with CBC   -Transfuse for Hb <7%  Hyperglycemia  CBGs 182, 144. A1c on admission 4.9 -Continue sliding scale insulin  -Consider increasing Levemir to 12 units daily -CBG goal 140-180  HFpEF Echo Grade 1 diastolic dysfunction -Strict I/O's  -Continue Lasix 40 mg IV once daly   Hypertension, stable  SBP 131/73 Home medications: Amlodipine, carvedilol, furosemide, lisinopril on hold s/p pressor requirement this admission. EF 60-65% -Strict I/O -Daily BMP -Hold all home antihypertensives   AKI, improving  Cr 1.83 -Trend BMP / urinary output -Replace electrolytes as indicated -Avoid nephrotoxic agents, ensure adequate renal perfusion  Hypernatremia, resolved Na 137 -Continue free water via PEF, 211m Q4  -Monitor with BMP   Constipation -PRN miralax, colace  At Risk Malnutrition  PEG Status  PEG (9/29) -TF per Nutrition   Best practice:  Diet: NPO, TF  Pain/Anxiety/Delirium protocol (if indicated): Fentanyl IVP PRN VAP protocol (if indicated): in place: HOB 30 degrees, suction as needed DVT prophylaxis: Heparin SQ/ SCDs GI prophylaxis: Protonix Glucose control: SSI & levemir BID Mobility: Bedrest, mobilize as tolerated Code Status: DNR Family Communication: Sister updated on 10/6 at bedside Disposition: ICU  Critical care time:  Lattie Haw MD, PGY2 [place] [facility] 03/22/2020, 8:42 AM   Please see [email or web address] for pager details.

## 2020-03-22 NOTE — Evaluation (Signed)
[facility]y-[facility]ir Speaking Valve - Evaluation Patient Details  Name: Kimberly Howell MRN: [id number] Date of Birth: Nov 09, 1945  Today's Date: 03/22/2020 Time: 1450-1516 SLP Time Calculation (min) (ACUTE ONLY): 26 min  Past Medical History:  Past Medical History:  Diagnosis Date  . Chronic respiratory failure with hypoxia ([place])   . Obesity    Past Surgical History:  Past Surgical History:  Procedure Laterality Date  . IR GASTROSTOMY TUBE MOD SED  03/14/2020   HPI:  74 year old female with PMH chronic respiratory failure with COPD on 3 L home O2 and CPAP nightly, stage IV CKD, hypertension, morbid obesity who had a cardiac arrest 9/18 with >45 minutes CPR before ROSC then lost pulses three more times.  ETT 9/18-28; 9/29 PEG; trach 9/28; trach collar trials 10/6.  MRI 9/24 highly suspicious for hypoxic/ischemic injury.   Assessment / Plan / Recommendation Clinical Impression  Pt participated in initial PMV evaluation - currently tolerating ATC trials and did so yesterday for eight hours.  Her sister, Kimberly Howell, was at the bedside.  Pt was lethargic -she could be roused to open her eyes, but did not follow commands.  Has #6 XL trach. Cuff was partially deflated at baseline -remaining air in balloon was deflated and valve was placed.  Intermittent removal of valve did not lead to signs of air trapping.  Pt coughed intermittently, successfully bringing up oral secretions which were suctioned from mouth.  Despite max tactile/verbal cues, there were no attempts to phonate.  VS were at 97 HR, 98% SPO2 baseline - RR ranged from low 20s, reaching 40s briefly, valve removed and replaced after RR returned to baseline.  No change in oxygen saturations.    Discussed purpose/function of valve with Kimberly Howell and our plan for ongoing efforts to work with the pt.  SLP will follow as her mental status allows.  D/W RN.  SLP Visit Diagnosis: Aphonia (R49.1)    SLP Assessment  Patient needs continued Speech Lanaguage  Pathology Services    Follow Up Recommendations   tba    Frequency and Duration min 3x week  2 weeks    PMSV Trial PMSV was placed for: intervals of 5-6 breath cycles Able to redirect subglottic air through upper airway: Yes Able to Attain Phonation: No attempt to phonate Able to Expectorate Secretions: Yes Level of Secretion Expectoration with PMSV: Oral Breath Support for Phonation:  (n/a) Respirations During Trial: (!) 32 SpO2 During Trial: 98 % Pulse During Trial: 97   Tracheostomy Tube  Additional Tracheostomy Tube Assessment Fenestrated: No    Vent Dependency  Vent Dependent: No FiO2 (%): 35 %    Cuff Deflation Trial  GO Tolerated Cuff Deflation: Yes Length of Time for Cuff Deflation Trial: [address]        Kimberly Howell 03/22/2020, 3:39 PM Kimberly Howell L. Kimberly Howell, [place] Office number [phone] Pager [phone]

## 2020-03-22 NOTE — Progress Notes (Addendum)
Subjective: Patient is awake and only able to follow very simple commands such as looking to voice laterally.  Objective: Current vital signs: BP 99/69   Pulse 90   Temp 98.7 F (37.1 C) (Oral)   Resp (!) 22   Ht _0  (1.727 m)   Wt 126.5 kg   SpO2 100%   BMI 42.40 kg/m  Vital signs in last 24 hours: Temp:  [98.6 F (37 C)-99.2 F (37.3 C)] 98.7 F (37.1 C) (10/07 0300) Pulse Rate:  [80-110] 90 (10/07 0630) Resp:  [20-31] 22 (10/07 0630) BP: (90-143)/(61-98) 99/69 (10/07 0630) SpO2:  [96 %-100 %] 100 % (10/07 0630) FiO2 (%):  [30 %-40 %] 35 % (10/07 0719) Weight:  [126.5 kg] 126.5 kg (10/07 0500)  Intake/Output from previous day: 10/06 0701 - 10/07 0700 In: 250 [IV Piggyback:200] Out: 1425 [Urine:1175; Stool:250] Intake/Output this shift: No intake/output data recorded. Nutritional status:  Diet Order    None      Neuro: Mental Status: Patient is awake, nonverbal. Cranial Nerves: II: Visual Fields are full. PERRL.  III,IV, VI: EOMI without ptosis or diplopia.  V: Facial sensation is symmetric to temperature VII: Facial movement is symmetric while grimacing to noxious stimulation. No facial twitching noted.   VIII: hearing is intact to voice Motor: Moving upper extremities antigravity with no myoclonus noted.   Did not move lower extremities to noxious stimuli. Decreased tone bilaterally Sensory: Intact throughout.  With noxious stimuli patient winces.   Lab Results: Results for orders placed or performed during the hospital encounter of 03/03/20 (from the past 48 hour(s))  Glucose, capillary     Status: Abnormal   Collection Time: 03/20/20  8:07 AM  Result Value Ref Range   Glucose-Capillary 191 (H) 70 - 99 mg/dL    Comment: Glucose reference range applies only to samples taken after fasting for at least 8 hours.  Glucose, capillary     Status: Abnormal   Collection Time: 03/20/20 11:30 AM  Result Value Ref Range   Glucose-Capillary 198 (H) 70 - 99  mg/dL    Comment: Glucose reference range applies only to samples taken after fasting for at least 8 hours.  Glucose, capillary     Status: Abnormal   Collection Time: 03/20/20  3:32 PM  Result Value Ref Range   Glucose-Capillary 184 (H) 70 - 99 mg/dL    Comment: Glucose reference range applies only to samples taken after fasting for at least 8 hours.  Glucose, capillary     Status: Abnormal   Collection Time: 03/20/20  8:27 PM  Result Value Ref Range   Glucose-Capillary 165 (H) 70 - 99 mg/dL    Comment: Glucose reference range applies only to samples taken after fasting for at least 8 hours.  Basic metabolic panel     Status: Abnormal   Collection Time: 03/21/20 12:26 AM  Result Value Ref Range   Sodium 136 135 - 145 mmol/L   Potassium 4.0 3.5 - 5.1 mmol/L   Chloride 103 98 - 111 mmol/L   CO2 19 (L) 22 - 32 mmol/L   Glucose, Bld 209 (H) 70 - 99 mg/dL    Comment: Glucose reference range applies only to samples taken after fasting for at least 8 hours.   BUN 59 (H) 8 - 23 mg/dL   Creatinine, Ser 1.87 (H) 0.44 - 1.00 mg/dL   Calcium 8.6 (L) 8.9 - 10.3 mg/dL   GFR calc non Af Amer 27 (L) >60 mL/min   Anion  gap 14 5 - 15    Comment: Performed at [facility], [place] [address] [place], [place] [address]  CBC     Status: Abnormal   Collection Time: 03/21/20 12:26 AM  Result Value Ref Range   WBC 9.6 4.0 - 10.5 K/uL   RBC 3.02 (L) 3.87 - 5.11 MIL/uL   Hemoglobin 9.1 (L) 12.0 - 15.0 g/dL   HCT 28.9 (L) 36 - 46 %   MCV 95.7 80.0 - 100.0 fL   MCH 30.1 26.0 - 34.0 pg   MCHC 31.5 30.0 - 36.0 g/dL   RDW 15.5 11.5 - 15.5 %   Platelets 266 150 - 400 K/uL   nRBC 0.0 0.0 - 0.2 %    Comment: Performed at [facility], [place] [address], [place], [place] [address]  Glucose, capillary     Status: Abnormal   Collection Time: 03/21/20 12:32 AM  Result Value Ref Range   Glucose-Capillary 206 (H) 70 - 99 mg/dL    Comment: Glucose reference range applies only to samples taken after  fasting for at least 8 hours.  Glucose, capillary     Status: Abnormal   Collection Time: 03/21/20  4:19 AM  Result Value Ref Range   Glucose-Capillary 159 (H) 70 - 99 mg/dL    Comment: Glucose reference range applies only to samples taken after fasting for at least 8 hours.  Glucose, capillary     Status: Abnormal   Collection Time: 03/21/20  8:25 AM  Result Value Ref Range   Glucose-Capillary 186 (H) 70 - 99 mg/dL    Comment: Glucose reference range applies only to samples taken after fasting for at least 8 hours.  Valproic acid level     Status: Abnormal   Collection Time: 03/21/20 10:37 AM  Result Value Ref Range   Valproic Acid Lvl 46 (L) 50.0 - 100.0 ug/mL    Comment: Performed at [facility] [address], [place], [place] [address]  Glucose, capillary     Status: Abnormal   Collection Time: 03/21/20 11:37 AM  Result Value Ref Range   Glucose-Capillary 185 (H) 70 - 99 mg/dL    Comment: Glucose reference range applies only to samples taken after fasting for at least 8 hours.  Glucose, capillary     Status: Abnormal   Collection Time: 03/21/20  3:36 PM  Result Value Ref Range   Glucose-Capillary 177 (H) 70 - 99 mg/dL    Comment: Glucose reference range applies only to samples taken after fasting for at least 8 hours.  Glucose, capillary     Status: Abnormal   Collection Time: 03/21/20  8:07 PM  Result Value Ref Range   Glucose-Capillary 189 (H) 70 - 99 mg/dL    Comment: Glucose reference range applies only to samples taken after fasting for at least 8 hours.  Glucose, capillary     Status: Abnormal   Collection Time: 03/22/20 12:17 AM  Result Value Ref Range   Glucose-Capillary 144 (H) 70 - 99 mg/dL    Comment: Glucose reference range applies only to samples taken after fasting for at least 8 hours.  Basic metabolic panel     Status: Abnormal   Collection Time: 03/22/20  3:25 AM  Result Value Ref Range   Sodium 137 135 - 145 mmol/L   Potassium 4.1 3.5 - 5.1  mmol/L   Chloride 103 98 - 111 mmol/L   CO2 19 (L) 22 - 32 mmol/L   Glucose,  Bld 205 (H) 70 - 99 mg/dL    Comment: Glucose reference range applies only to samples taken after fasting for at least 8 hours.   BUN 65 (H) 8 - 23 mg/dL   Creatinine, Ser 1.83 (H) 0.44 - 1.00 mg/dL   Calcium 8.5 (L) 8.9 - 10.3 mg/dL   GFR calc non Af Amer 27 (L) >60 mL/min   Anion gap 15 5 - 15    Comment: Performed at [facility] [address], [place], [place] [address]  CBC     Status: Abnormal   Collection Time: 03/22/20  3:25 AM  Result Value Ref Range   WBC 9.2 4.0 - 10.5 K/uL   RBC 3.19 (L) 3.87 - 5.11 MIL/uL   Hemoglobin 9.3 (L) 12.0 - 15.0 g/dL   HCT 30.6 (L) 36 - 46 %   MCV 95.9 80.0 - 100.0 fL   MCH 29.2 26.0 - 34.0 pg   MCHC 30.4 30.0 - 36.0 g/dL   RDW 15.6 (H) 11.5 - 15.5 %   Platelets 259 150 - 400 K/uL   nRBC 0.0 0.0 - 0.2 %    Comment: Performed at [facility], [place] [address], [place], [place] [address]  Glucose, capillary     Status: Abnormal   Collection Time: 03/22/20  4:10 AM  Result Value Ref Range   Glucose-Capillary 182 (H) 70 - 99 mg/dL    Comment: Glucose reference range applies only to samples taken after fasting for at least 8 hours.    Recent Results (from the past 240 hour(s))  Culture, Urine     Status: None   Collection Time: 03/12/20  3:57 PM   Specimen: Urine, Catheterized  Result Value Ref Range Status   Specimen Description URINE, CATHETERIZED  Final   Special Requests NONE  Final   Culture   Final    NO GROWTH Performed at [facility], [address] [place], [place] [address]    Report Status 03/13/2020 FINAL  Final  Culture, blood (routine x 2)     Status: None   Collection Time: 03/12/20  4:24 PM   Specimen: BLOOD LEFT ARM  Result Value Ref Range Status   Specimen Description BLOOD LEFT ARM  Final   Special Requests   Final    BOTTLES DRAWN AEROBIC AND ANAEROBIC Blood Culture adequate volume   Culture   Final    NO GROWTH 5  DAYS Performed at [facility], [place] [address] [place], [place] [address]    Report Status 03/17/2020 FINAL  Final  Culture, blood (routine x 2)     Status: None   Collection Time: 03/12/20  4:36 PM   Specimen: BLOOD LEFT ARM  Result Value Ref Range Status   Specimen Description BLOOD LEFT ARM  Final   Special Requests   Final    BOTTLES DRAWN AEROBIC AND ANAEROBIC Blood Culture adequate volume   Culture   Final    NO GROWTH 5 DAYS Performed at [facility], [place] [address], [place], [place] [address]    Report Status 03/17/2020 FINAL  Final  CSF culture     Status: None   Collection Time: 03/12/20  4:59 PM   Specimen: CSF; Cerebrospinal Fluid  Result Value Ref Range Status   Specimen Description CSF  Final   Special Requests Normal  Final   Gram Stain   Final    WBC PRESENT,BOTH PMN AND MONONUCLEAR NO ORGANISMS SEEN  CYTOSPIN SMEAR    Culture   Final    NO GROWTH 3 DAYS Performed at [facility], [place] [address] [place], [place] [address]    Report Status 03/16/2020 FINAL  Final  Culture, respiratory     Status: None   Collection Time: 03/13/20  9:17 AM   Specimen: Bronchoalveolar Lavage; Respiratory  Result Value Ref Range Status   Specimen Description BRONCHIAL ALVEOLAR LAVAGE  Final   Special Requests NONE  Final   Gram Stain   Final    FEW WBC PRESENT, PREDOMINANTLY MONONUCLEAR NO ORGANISMS SEEN    Culture   Final    NO GROWTH 2 DAYS Performed at [facility], [address] [place], [place] [address]    Report Status 03/15/2020 FINAL  Final  Culture, respiratory (non-expectorated)     Status: None   Collection Time: 03/17/20  3:07 PM   Specimen: Tracheal Aspirate; Respiratory  Result Value Ref Range Status   Specimen Description TRACHEAL ASPIRATE  Final   Special Requests NONE  Final   Gram Stain   Final    NO WBC SEEN NO ORGANISMS SEEN Performed at [facility], [address], [place], [place] [address]    Culture RARE  CANDIDA ALBICANS  Final   Report Status 03/20/2020 FINAL  Final    Lipid Panel No results for input(s): CHOL, TRIG, HDL, CHOLHDL, VLDL, LDLCALC in the last 72 hours.  Studies/Results: No results found.  Medications:  Scheduled: . chlorhexidine  15 mL Mouth Rinse BID  . Chlorhexidine Gluconate Cloth  6 each Topical Daily  . clonazepam  1 mg Per Tube BID  . feeding supplement (PROSource TF)  45 mL Per Tube Daily  . free water  200 mL Per Tube Q4H  . furosemide  40 mg Intravenous Daily  . heparin  5,000 Units Subcutaneous Q8H  . influenza vaccine adjuvanted  0.5 mL Intramuscular Tomorrow-1000  . insulin aspart  0-15 Units Subcutaneous Q4H  . insulin detemir  8 Units Subcutaneous Daily  . mouth rinse  15 mL Mouth Rinse q12n4p  . melatonin  3 mg Per Tube QHS  . pantoprazole sodium  40 mg Per Tube QHS  . pneumococcal 23 valent vaccine  0.5 mL Intramuscular Tomorrow-1000  . sodium chloride flush  10-40 mL Intracatheter Q12H  . valproic acid  1,250 mg Per Tube TID   Continuous: . sodium chloride Stopped (03/06/20 1523)  . cefTRIAXone (ROCEPHIN)  IV Stopped (03/21/20 2305)  . feeding supplement (VITAL AF 1.2 CAL) 1,000 mL (03/20/20 2257)    Assessment:30 year oldfemale with TIIDM, CKD, morbid obesity, COPD and chronic hypoxic respiratory failure on supplemental O2 admitted after collapsing at home with cardiac arrest, undergoing 45 minutes of CPR with ROSC. MRI on9/26showed symmetric DWI abnormality suggestive ofanoxic brain injury within the medial temporal lobes and cerebellum with possible punctate acute infarct of paramedian right frontal lobe. --Patient has shown littleclinicalimprovementin terms of her level of consciousness during herhospital course. She developed worseningfacial, body, and extremity twitchingon Monday (10/4) after sedation was decreased. Twitching had also been seen about one week prior to that. Neurologic status is improved on today's exam with  resolution of facial and eye twitching. No myoclonus of the extremities is noted.  -- Spot EEGon Monday (10/4)revealedgeneralized continuous slowing. Thestudywas suggestive of moderate diffuse encephalopathy, nonspecifictoetiology.Nodefiniteseizures or epileptiform discharges were seen throughout the recording.Of note, the patient was noted to have episodes of facial twitching without concomitantEEGchange. However,focal motor seizures  may not be seen on scalp eeg. Therefore, clinical correlation was recommended.EEG-negative myoclonus is also in the differential. -- Depakote was started on Monday (10/4) and increased to 10 mg/kg TID on Wednesday (10/6). VPA level this AM is subtherapeutic at 46. -- Due to concern for auto tapering off previous midazolam while on mechanical ventilation and possibly reemergence of previously suppressed tremors or myoclonus, Klonopin 1 mg twice daily was started by CCM yesterday (Wednesday)    Recommendations: -Continue Depakote at 10 mg/kg q8h via PEG.  VPA level 64.   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 It may not resolve completely with anticonvulsant therapy.  - Mentation is improved today.  Makes grimacing motions with noxious stimuli.  Will follow command of looking to the left and to the right.  When attempting to look at pupils patient will clench her eyelids very strongly. - Long-term prognosis for meaningful neurological recovery is improving given current progress.    - Family wishes are for maximum medical therapy and eventual discharge to SNF   LOS: 19 days   40 minutes spent in the neurological evaluation and management of this critically ill patient.   _0  signed: Dr. Kerney Elbe 03/22/2020  7:27 AM

## 2020-03-22 NOTE — Progress Notes (Signed)
Physical Therapy Treatment Patient Details Name: Kimberly Howell MRN: [id number] DOB: 1946-05-18 Today's Date: 03/22/2020    History of Present Illness 74 yo admitted with cardiac arrest and fall on 9/18 s/p CPR with resultant left orbit fx. Intubated 9/18, trach 9/ 28, PEG 9/29. PMhx: morbid obesity, HTN, respiratory failure on 3L, CKD stage 4    PT Comments    Pt supine in bed on arrival this session.  Pt with eyes open and trace participation noted in LUE.  All other extremities required PROM this session.  Pt continues to present with limited participation.  Will continue to recommend snf placement at d/c.      Follow Up Recommendations  SNF;Supervision/Assistance - 24 hour     Equipment Recommendations  Hospital bed;Other (comment);Wheelchair (measurements PT);Wheelchair cushion (measurements PT)    Recommendations for Other Services       Precautions / Restrictions Precautions Precautions: Fall;Other (comment) Precaution Comments: trach, vent, PEG  Restrictions Weight Bearing Restrictions: No    Mobility  Bed Mobility Overal bed mobility: Needs Assistance             General bed mobility comments: +2 total assistance for boosting and positioning into chair position.  PTA propped and elevated B UEs in recliner like position.  RLE ER and required propping with pillow for neutral alugnment,  Transfers                    Ambulation/Gait                 Stairs             Wheelchair Mobility    Modified Rankin (Stroke Patients Only)       Balance                                            Cognition Arousal/Alertness: Lethargic Behavior During Therapy: Flat affect Overall Cognitive Status: Impaired/Different from baseline Area of Impairment: Orientation;Attention;Memory;Following commands                 Orientation Level: Disoriented to;Place;Time;Situation Current Attention Level: Focused Memory:  Decreased short-term memory Following Commands: Follows one step commands inconsistently;Follows one step commands with increased time       General Comments: pt not following commands, but eyes open during session. Facial grimmacing noted with movement of extremities.  Pt with trace participation with use of LUE this session.      Exercises General Exercises - Upper Extremity Shoulder Flexion: PROM;Both;10 reps;Supine Elbow Flexion: AAROM;Both;Supine;PROM;Limitations;10 reps Elbow Flexion Limitations: PROM R UE, AAROM on LUE. Elbow Extension: AAROM;Both;Supine;PROM;10 reps Wrist Flexion: PROM;Both;5 reps;Supine Wrist Extension: PROM;Both;5 reps;Supine Digit Composite Flexion: PROM;Both;5 reps;Supine Composite Extension: PROM;Both;5 reps;Supine General Exercises - Lower Extremity Heel Slides: PROM;Both;Supine;10 reps Hip ABduction/ADduction: Both;PROM;10 reps;Supine    General Comments        Pertinent Vitals/Pain Pain Assessment: Faces Faces Pain Scale: Hurts little more Pain Location: shoulders Pain Descriptors / Indicators: Grimacing Pain Intervention(s): Monitored during session;Repositioned    Home Living                      Prior Function            PT Goals (current goals can now be found in the care plan section) Acute Rehab PT Goals PT Goal Formulation: Patient unable to participate in goal setting  Potential to Achieve Goals: Fair Progress towards PT goals: Progressing toward goals    Frequency    Min 2X/week      PT Plan Current plan remains appropriate    Co-evaluation              AM-PAC PT "6 Clicks" Mobility   Outcome Measure  Help needed turning from your back to your side while in a flat bed without using bedrails?: Total Help needed moving from lying on your back to sitting on the side of a flat bed without using bedrails?: Total Help needed moving to and from a bed to a chair (including a wheelchair)?: Total Help needed  standing up from a chair using your arms (e.g., wheelchair or bedside chair)?: Total Help needed to walk in hospital room?: Total Help needed climbing 3-5 steps with a railing? : Total 6 Click Score: 6    End of Session   Activity Tolerance: Patient tolerated treatment well Patient left: in bed;with call bell/phone within reach;with bed alarm set;with nursing/sitter in room Nurse Communication: Mobility status;Need for lift equipment PT Visit Diagnosis: Other abnormalities of gait and mobility (R26.89);Muscle weakness (generalized) (M62.81);Other symptoms and signs involving the nervous system (R29.898)     Time: 0940-1001 PT Time Calculation (min) (ACUTE ONLY): 21 min  Charges:  $Therapeutic Exercise: 8-22 mins                     Erasmo Leventhal , PTA Acute Rehabilitation Services Pager [phone] Office [phone]     Jazminn Pomales Eli Hose 03/22/2020, 11:12 AM

## 2020-03-23 DIAGNOSIS — Z930 Tracheostomy status: Secondary | ICD-10-CM | POA: Diagnosis not present

## 2020-03-23 DIAGNOSIS — G253 Myoclonus: Secondary | ICD-10-CM | POA: Diagnosis not present

## 2020-03-23 DIAGNOSIS — G931 Anoxic brain damage, not elsewhere classified: Secondary | ICD-10-CM | POA: Diagnosis not present

## 2020-03-23 DIAGNOSIS — Z9911 Dependence on respirator [ventilator] status: Secondary | ICD-10-CM | POA: Diagnosis not present

## 2020-03-23 DIAGNOSIS — J9621 Acute and chronic respiratory failure with hypoxia: Secondary | ICD-10-CM | POA: Diagnosis not present

## 2020-03-23 LAB — BASIC METABOLIC PANEL
Anion gap: 15 (ref 5–15)
BUN: 75 mg/dL — ABNORMAL HIGH (ref 8–23)
CO2: 19 mmol/L — ABNORMAL LOW (ref 22–32)
Calcium: 8.3 mg/dL — ABNORMAL LOW (ref 8.9–10.3)
Chloride: 102 mmol/L (ref 98–111)
Creatinine, Ser: 1.73 mg/dL — ABNORMAL HIGH (ref 0.44–1.00)
GFR calc non Af Amer: 29 mL/min — ABNORMAL LOW (ref >60)
Glucose, Bld: 228 mg/dL — ABNORMAL HIGH (ref 70–99)
Potassium: 4.2 mmol/L (ref 3.5–5.1)
Sodium: 136 mmol/L (ref 135–145)

## 2020-03-23 LAB — URINALYSIS, ROUTINE W REFLEX MICROSCOPIC
Bilirubin Urine: NEGATIVE
Glucose, UA: 50 mg/dL — AB
Ketones, ur: NEGATIVE mg/dL
Leukocytes,Ua: NEGATIVE
Nitrite: NEGATIVE
Protein, ur: 30 mg/dL — AB
Specific Gravity, Urine: 1.01 (ref 1.005–1.030)
pH: 5 (ref 5.0–8.0)

## 2020-03-23 LAB — CBC
HCT: 30.6 % — ABNORMAL LOW (ref 36.0–46.0)
Hemoglobin: 9.2 g/dL — ABNORMAL LOW (ref 12.0–15.0)
MCH: 28.9 pg (ref 26.0–34.0)
MCHC: 30.1 g/dL (ref 30.0–36.0)
MCV: 96.2 fL (ref 80.0–100.0)
Platelets: 224 10*3/uL (ref 150–400)
RBC: 3.18 MIL/uL — ABNORMAL LOW (ref 3.87–5.11)
RDW: 15.5 % (ref 11.5–15.5)
WBC: 7.6 10*3/uL (ref 4.0–10.5)
nRBC: 0 % (ref 0.0–0.2)

## 2020-03-23 LAB — GLUCOSE, CAPILLARY
Glucose-Capillary: 185 mg/dL — ABNORMAL HIGH (ref 70–99)
Glucose-Capillary: 186 mg/dL — ABNORMAL HIGH (ref 70–99)
Glucose-Capillary: 186 mg/dL — ABNORMAL HIGH (ref 70–99)
Glucose-Capillary: 190 mg/dL — ABNORMAL HIGH (ref 70–99)
Glucose-Capillary: 204 mg/dL — ABNORMAL HIGH (ref 70–99)
Glucose-Capillary: 209 mg/dL — ABNORMAL HIGH (ref 70–99)

## 2020-03-23 LAB — VALPROIC ACID LEVEL: Valproic Acid Lvl: 52 ug/mL (ref 50.0–100.0)

## 2020-03-23 MED ORDER — INSULIN ASPART 100 UNIT/ML ~~LOC~~ SOLN
2.0000 [IU] | SUBCUTANEOUS | Status: DC
  Administered 2020-03-23 – 2020-03-26 (×16): 2 [IU] via SUBCUTANEOUS

## 2020-03-23 NOTE — Progress Notes (Addendum)
Subjective: Difficult to rouse this morning. Not opening eyes to voice, but opens eyes slightly with sternal rub and grimaces.   Objective: Current vital signs: BP (!) 103/58   Pulse 93   Temp 98.7 F (37.1 C) (Oral)   Resp (!) 22   Ht _0  (1.727 m)   Wt 127 kg   SpO2 98%   BMI 42.57 kg/m  Vital signs in last 24 hours: Temp:  [98.5 F (36.9 C)-99.1 F (37.3 C)] 98.7 F (37.1 C) (10/08 0300) Pulse Rate:  [85-106] 93 (10/08 0700) Resp:  [19-34] 22 (10/08 0700) BP: (82-142)/(55-81) 103/58 (10/08 0700) SpO2:  [96 %-100 %] 98 % (10/08 0700) FiO2 (%):  [30 %-35 %] 30 % (10/08 0131) Weight:  [759 kg] 127 kg (10/08 0500)  Intake/Output from previous day: 10/07 0701 - 10/08 0700 In: 1450 [NG/GT:1260; IV Piggyback:100] Out: 1870 [Urine:1420; Stool:450] Intake/Output this shift: No intake/output data recorded. Nutritional status:  Diet Order    None      Neurologic Exam: Mental Status: Difficult to rouse, opens eyes and grimaces to sternal rub, not following commands or localizing to voice.  CN: II: PERRL III, IV, VI: Unable to assess full EOM, only opening eyes partially and not looking left or right VII: grimacing to noxious stimuli with small amount of facial twitching with grimace  VIII: winces to clap but otherwise not responding  Motor: Moving upper and lower extremities, no myoclonus Sensory: Not withdrawing or grimacing to pain with upper extremities Moves feet slightly with LE noxious stimuli  Lab Results: Results for orders placed or performed during the hospital encounter of 03/03/20 (from the past 48 hour(s))  Glucose, capillary     Status: Abnormal   Collection Time: 03/21/20  8:25 AM  Result Value Ref Range   Glucose-Capillary 186 (H) 70 - 99 mg/dL    Comment: Glucose reference range applies only to samples taken after fasting for at least 8 hours.  Valproic acid level     Status: Abnormal   Collection Time: 03/21/20 10:37 AM  Result Value Ref Range    Valproic Acid Lvl 46 (L) 50.0 - 100.0 ug/mL    Comment: Performed at [facility] [address] [place], [place] [address]  Glucose, capillary     Status: Abnormal   Collection Time: 03/21/20 11:37 AM  Result Value Ref Range   Glucose-Capillary 185 (H) 70 - 99 mg/dL    Comment: Glucose reference range applies only to samples taken after fasting for at least 8 hours.  Glucose, capillary     Status: Abnormal   Collection Time: 03/21/20  3:36 PM  Result Value Ref Range   Glucose-Capillary 177 (H) 70 - 99 mg/dL    Comment: Glucose reference range applies only to samples taken after fasting for at least 8 hours.  Glucose, capillary     Status: Abnormal   Collection Time: 03/21/20  8:07 PM  Result Value Ref Range   Glucose-Capillary 189 (H) 70 - 99 mg/dL    Comment: Glucose reference range applies only to samples taken after fasting for at least 8 hours.  Glucose, capillary     Status: Abnormal   Collection Time: 03/22/20 12:17 AM  Result Value Ref Range   Glucose-Capillary 144 (H) 70 - 99 mg/dL    Comment: Glucose reference range applies only to samples taken after fasting for at least 8 hours.  Basic metabolic panel     Status: Abnormal   Collection Time: 03/22/20  3:25 AM  Result Value Ref Range   Sodium 137 135 - 145 mmol/L   Potassium 4.1 3.5 - 5.1 mmol/L   Chloride 103 98 - 111 mmol/L   CO2 19 (L) 22 - 32 mmol/L   Glucose, Bld 205 (H) 70 - 99 mg/dL    Comment: Glucose reference range applies only to samples taken after fasting for at least 8 hours.   BUN 65 (H) 8 - 23 mg/dL   Creatinine, Ser 1.83 (H) 0.44 - 1.00 mg/dL   Calcium 8.5 (L) 8.9 - 10.3 mg/dL   GFR calc non Af Amer 27 (L) >60 mL/min   Anion gap 15 5 - 15    Comment: Performed at [facility] [address], [place], [place] [address]  CBC     Status: Abnormal   Collection Time: 03/22/20  3:25 AM  Result Value Ref Range   WBC 9.2 4.0 - 10.5 K/uL   RBC 3.19 (L) 3.87 - 5.11 MIL/uL   Hemoglobin 9.3  (L) 12.0 - 15.0 g/dL   HCT 30.6 (L) 36 - 46 %   MCV 95.9 80.0 - 100.0 fL   MCH 29.2 26.0 - 34.0 pg   MCHC 30.4 30.0 - 36.0 g/dL   RDW 15.6 (H) 11.5 - 15.5 %   Platelets 259 150 - 400 K/uL   nRBC 0.0 0.0 - 0.2 %    Comment: Performed at [facility], [place] [address] [place], [place] [address]  Glucose, capillary     Status: Abnormal   Collection Time: 03/22/20  4:10 AM  Result Value Ref Range   Glucose-Capillary 182 (H) 70 - 99 mg/dL    Comment: Glucose reference range applies only to samples taken after fasting for at least 8 hours.  Glucose, capillary     Status: Abnormal   Collection Time: 03/22/20  8:06 AM  Result Value Ref Range   Glucose-Capillary 180 (H) 70 - 99 mg/dL    Comment: Glucose reference range applies only to samples taken after fasting for at least 8 hours.  Valproic acid level     Status: None   Collection Time: 03/22/20  8:17 AM  Result Value Ref Range   Valproic Acid Lvl 64 50.0 - 100.0 ug/mL    Comment: Performed at [facility] [address], [place], [place] [address]  Glucose, capillary     Status: Abnormal   Collection Time: 03/22/20 11:28 AM  Result Value Ref Range   Glucose-Capillary 190 (H) 70 - 99 mg/dL    Comment: Glucose reference range applies only to samples taken after fasting for at least 8 hours.  I-STAT 7, (LYTES, BLD GAS, ICA, H+H)     Status: Abnormal   Collection Time: 03/22/20 11:32 AM  Result Value Ref Range   pH, Arterial 7.376 7.35 - 7.45   pCO2 arterial 41.0 32 - 48 mmHg   pO2, Arterial 88 83 - 108 mmHg   Bicarbonate 24.0 20.0 - 28.0 mmol/L   TCO2 25 22 - 32 mmol/L   O2 Saturation 97.0 %   Acid-base deficit 1.0 0.0 - 2.0 mmol/L   Sodium 136 135 - 145 mmol/L   Potassium 4.3 3.5 - 5.1 mmol/L   Calcium, Ion 1.20 1.15 - 1.40 mmol/L   HCT 31.0 (L) 36 - 46 %   Hemoglobin 10.5 (L) 12.0 - 15.0 g/dL   Patient temperature 98.5 F    Collection site Radial    Drawn by HIDE  Sample type ARTERIAL   Glucose, capillary      Status: Abnormal   Collection Time: 03/22/20  3:22 PM  Result Value Ref Range   Glucose-Capillary 215 (H) 70 - 99 mg/dL    Comment: Glucose reference range applies only to samples taken after fasting for at least 8 hours.  Glucose, capillary     Status: Abnormal   Collection Time: 03/22/20  8:48 PM  Result Value Ref Range   Glucose-Capillary 164 (H) 70 - 99 mg/dL    Comment: Glucose reference range applies only to samples taken after fasting for at least 8 hours.  Glucose, capillary     Status: Abnormal   Collection Time: 03/22/20 11:23 PM  Result Value Ref Range   Glucose-Capillary 193 (H) 70 - 99 mg/dL    Comment: Glucose reference range applies only to samples taken after fasting for at least 8 hours.  Basic metabolic panel     Status: Abnormal   Collection Time: 03/23/20  1:00 AM  Result Value Ref Range   Sodium 136 135 - 145 mmol/L   Potassium 4.2 3.5 - 5.1 mmol/L   Chloride 102 98 - 111 mmol/L   CO2 19 (L) 22 - 32 mmol/L   Glucose, Bld 228 (H) 70 - 99 mg/dL    Comment: Glucose reference range applies only to samples taken after fasting for at least 8 hours.   BUN 75 (H) 8 - 23 mg/dL   Creatinine, Ser 1.73 (H) 0.44 - 1.00 mg/dL   Calcium 8.3 (L) 8.9 - 10.3 mg/dL   GFR calc non Af Amer 29 (L) >60 mL/min   Anion gap 15 5 - 15    Comment: Performed at [facility] [address], [place], [place] [address]  CBC     Status: Abnormal   Collection Time: 03/23/20  1:00 AM  Result Value Ref Range   WBC 7.6 4.0 - 10.5 K/uL   RBC 3.18 (L) 3.87 - 5.11 MIL/uL   Hemoglobin 9.2 (L) 12.0 - 15.0 g/dL   HCT 30.6 (L) 36 - 46 %   MCV 96.2 80.0 - 100.0 fL   MCH 28.9 26.0 - 34.0 pg   MCHC 30.1 30.0 - 36.0 g/dL   RDW 15.5 11.5 - 15.5 %   Platelets 224 150 - 400 K/uL   nRBC 0.0 0.0 - 0.2 %    Comment: Performed at [facility], [place] [address], [place], [place] [address]  Glucose, capillary     Status: Abnormal   Collection Time: 03/23/20  4:38 AM  Result Value Ref Range    Glucose-Capillary 185 (H) 70 - 99 mg/dL    Comment: Glucose reference range applies only to samples taken after fasting for at least 8 hours.    Recent Results (from the past 240 hour(s))  Culture, respiratory     Status: None   Collection Time: 03/13/20  9:17 AM   Specimen: Bronchoalveolar Lavage; Respiratory  Result Value Ref Range Status   Specimen Description BRONCHIAL ALVEOLAR LAVAGE  Final   Special Requests NONE  Final   Gram Stain   Final    FEW WBC PRESENT, PREDOMINANTLY MONONUCLEAR NO ORGANISMS SEEN    Culture   Final    NO GROWTH 2 DAYS Performed at [facility], [address], [place], [place] [address]    Report Status 03/15/2020 FINAL  Final  Culture, respiratory (non-expectorated)     Status: None   Collection Time: 03/17/20  3:07 PM   Specimen: Tracheal Aspirate; Respiratory  Result Value Ref Range Status   Specimen Description TRACHEAL ASPIRATE  Final   Special Requests NONE  Final   Gram Stain   Final    NO WBC SEEN NO ORGANISMS SEEN Performed at [facility], [address], [place], [place] [address]    Culture RARE CANDIDA ALBICANS  Final   Report Status 03/20/2020 FINAL  Final    Lipid Panel No results for input(s): CHOL, TRIG, HDL, CHOLHDL, VLDL, LDLCALC in the last 72 hours.  Studies/Results: No results found.  Medications:  Scheduled: . chlorhexidine  15 mL Mouth Rinse BID  . Chlorhexidine Gluconate Cloth  6 each Topical Daily  . clonazepam  1 mg Per Tube BID  . feeding supplement (PROSource TF)  45 mL Per Tube Daily  . free water  200 mL Per Tube Q4H  . heparin  5,000 Units Subcutaneous Q8H  . influenza vaccine adjuvanted  0.5 mL Intramuscular Tomorrow-1000  . insulin aspart  0-15 Units Subcutaneous Q4H  . insulin detemir  12 Units Subcutaneous Daily  . mouth rinse  15 mL Mouth Rinse q12n4p  . melatonin  3 mg Per Tube QHS  . pantoprazole sodium  40 mg Per Tube QHS  . pneumococcal 23 valent vaccine  0.5 mL Intramuscular  Tomorrow-1000  . valproic acid  1,250 mg Per Tube TID   Continuous: . sodium chloride 250 mL (03/22/20 2109)  . cefTRIAXone (ROCEPHIN)  IV Stopped (03/22/20 2146)  . feeding supplement (VITAL AF 1.2 CAL) 1,000 mL (03/22/20 1812)    Assessment:89 year oldfemale with TIIDM, CKD, morbid obesity, COPD and chronic hypoxic respiratory failure on supplemental O2 admitted after collapsing at home with cardiac arrest, undergoing 45 minutes of CPR with ROSC. MRI on9/26showed symmetric DWI abnormality suggestive ofanoxic brain injury within the medial temporal lobes and cerebellum with possible punctate acute infarct of paramedian right frontal lobe. --Patient has shown littleclinicalimprovementin terms of her level of consciousness during herhospital course. She developed worseningfacial, body, and extremity twitchingon Monday (10/4) after sedation was decreased. Twitching had also been seen about one week prior to that. Neurologic status decreased today as she is less responsive. No myoclonus of the extremities, but minor facial twitching with grimacing.  -- Spot EEGon Monday (10/4)revealedgeneralized continuous slowing. Thestudywas suggestive of moderate diffuse encephalopathy, nonspecifictoetiology.Nodefiniteseizures or epileptiform discharges were seen throughout the recording.Of note, the patient was noted to have episodes of facial twitching without concomitantEEGchange. However,focal motor seizures may not be seen on scalp eeg. Therefore, clinical correlation was recommended.EEG-negative myoclonus is also in the differential. -- Depakote was startedon Monday(10/4) and increased to 10 mg/kg TID on Wednesday (10/6). VPA level yesterday therapeutic at 64.  -- Due to concern for auto tapering off previous midazolam while on mechanical ventilation and possibly reemergence of previously suppressed tremors or myoclonus, Klonopin 1 mg twice daily was started by CCM 10/6.     Recommendations: -ContinueDepakoteat 54m/kg q8h via PEG for now. Decreased mentation this morning, somewhat difficult to rouse. Did not receive klonopin yesterday evening. Check VPA level and see if she is somewhat more awake in the afternoon.  - Small amount of facial twitching this morning with grimace but will avoid increasing depakote with level of drowsiness this morning.  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 It may not resolvecompletelywith anticonvulsant therapy.  -Long-term prognosis for meaningful neurological recovery wasimproving due to increased level of alertness and resolving myoclonus with VPA earlier this  week, but may now be plateauing.   - Family wishes are for maximum medical therapy and eventual discharge to SNF  35 minutes spent in the neurological evaluation and management of this critically ill patient.    LOS: 20 days   Marty Heck, DO 03/23/2020, 8:18 AM Pager: [phone]

## 2020-03-23 NOTE — TOC Progression Note (Addendum)
Transition of Care [facility]) - Progression Note    Patient Details  Name: Kimberly Howell MRN: [id number] Date of Birth: 03-Jul-1945  Transition of Care [facility]) CM/SW Leary, Aubrey Phone Number: 03/23/2020, 2:31 PM  Clinical Narrative:     CSW received e-mail from [place] with Financial counseling that she Faxed the Medicaid application to [place] today.Jenny Reichmann confirmed that she will let CSW  know the pending Medicaid ID# and caseworkers name,  probably by Monday. CSW called [facility] to speak with [facility] and no answer. CSW left voicemial to return to call to give update on Medicaid screening. CSW called patients son [name] to give him an update on SNF placement.  Pending bed offer.CSW awaiting callback from [facility].  CSW will continue to follow.  Expected Discharge Plan: Skilled Nursing Facility Barriers to Discharge: Continued Medical Work up  Expected Discharge Plan and Services Expected Discharge Plan: [place]   Discharge Planning Services: CM Consult Post Acute Care Choice: Long Term Acute Care (LTAC) Living arrangements for the past 2 months: Single Family Home                                       Social Determinants of Health (SDOH) Interventions    Readmission Risk Interventions Readmission Risk Prevention Plan 03/14/2020  Transportation Screening Complete  PCP or Specialist Appt within 5-7 Days Complete  Home Care Screening Complete  Medication Review (RN CM) Complete

## 2020-03-23 NOTE — Plan of Care (Signed)
  Problem: Clinical Measurements: Goal: Ability to maintain clinical measurements within normal limits will improve Outcome: Progressing Goal: Will remain free from infection Outcome: Progressing Goal: Diagnostic test results will improve Outcome: Progressing Goal: Respiratory complications will improve Outcome: Progressing Goal: Cardiovascular complication will be avoided Outcome: Progressing   Problem: Activity: Goal: Risk for activity intolerance will decrease Outcome: Progressing   Problem: Nutrition: Goal: Adequate nutrition will be maintained Outcome: Progressing   Problem: Coping: Goal: Level of anxiety will decrease Outcome: Progressing   Problem: Elimination: Goal: Will not experience complications related to bowel motility Outcome: Progressing Goal: Will not experience complications related to urinary retention Outcome: Progressing   Problem: Pain Managment: Goal: General experience of comfort will improve Outcome: Progressing   Problem: Safety: Goal: Ability to remain free from injury will improve Outcome: Progressing   Problem: Skin Integrity: Goal: Risk for impaired skin integrity will decrease Outcome: Progressing   Problem: Activity: Goal: Ability to tolerate increased activity will improve Outcome: Progressing   Problem: Respiratory: Goal: Ability to maintain a clear airway and adequate ventilation will improve Outcome: Progressing

## 2020-03-23 NOTE — Progress Notes (Signed)
RT placed patient on 35% ATC. No complications. Patient is tolerating well at this time. RT will continue to monitor.

## 2020-03-23 NOTE — Progress Notes (Addendum)
Occupational Therapy Treatment Patient Details Name: Kimberly Howell MRN: [id number] DOB: [birth year] Today's Date: 03/23/2020    History of present illness 74 yo admitted with cardiac arrest and fall on 9/18 s/p CPR with resultant left orbit fx. Intubated 9/18, trach 9/ 28, PEG 9/29. PMhx: morbid obesity, HTN, respiratory failure on 3L, CKD stage 4   OT comments  Pt seen for UE ROM and cognitive stimulation. Pt not opening eyes, but grimaces with shoulder ROM as she has since admission. Pt now on 10L 35% via trach during the day, vent at night.  Will continue to follow.   Follow Up Recommendations  LTACH;Supervision/Assistance - 24 hour    Equipment Recommendations  Other (comment) (defer to next venue)    Recommendations for Other Services      Precautions / Restrictions Precautions Precautions: Fall Precaution Comments: trach, PEG, vent at night       Mobility Bed Mobility                  Transfers                      Balance                                           ADL either performed or assessed with clinical judgement   ADL                                         General ADL Comments: dependent in all aspects     Vision       Perception     Praxis      Cognition Arousal/Alertness: Lethargic Behavior During Therapy: Flat affect                                   General Comments: pt unresponsive other than to pain        Exercises Exercises: General Upper Extremity General Exercises - Upper Extremity Shoulder Flexion: PROM;Both;10 reps;Supine Elbow Flexion: AAROM;Both;Supine;PROM;Limitations;10 reps Elbow Extension: AAROM;Both;Supine;PROM;10 reps Wrist Flexion: PROM;Both;5 reps;Supine Wrist Extension: PROM;Both;5 reps;Supine Digit Composite Flexion: PROM;Both;5 reps;Supine Composite Extension: PROM;Both;5 reps;Supine   Shoulder Instructions       General Comments       Pertinent Vitals/ Pain       Pain Assessment: Faces Faces Pain Scale: Hurts even more Pain Location: R shoulder with PROM Pain Descriptors / Indicators: Grimacing  Home Living                                          Prior Functioning/Environment              Frequency  Min 2X/week        Progress Toward Goals  OT Goals(current goals can now be found in the care plan section)  Progress towards OT goals: Not progressing toward goals - comment (lethargy)  Acute Rehab OT Goals OT Goal Formulation: Patient unable to participate in goal setting Time For Goal Achievement: 03/28/20 Potential to Achieve Goals: [place] Discharge plan remains appropriate    Co-evaluation  AM-PAC OT "6 Clicks" Daily Activity     Outcome Measure   Help from another person eating meals?: Total Help from another person taking care of personal grooming?: Total Help from another person toileting, which includes using toliet, bedpan, or urinal?: Total Help from another person bathing (including washing, rinsing, drying)?: Total Help from another person to put on and taking off regular upper body clothing?: Total Help from another person to put on and taking off regular lower body clothing?: Total 6 Click Score: 6    End of Session    OT Visit Diagnosis: Muscle weakness (generalized) (M62.81);Pain;History of falling (Z91.81);Other symptoms and signs involving cognitive function   Activity Tolerance Patient limited by lethargy   Patient Left in bed;with call bell/phone within reach   Nurse Communication          Time: 1415-1431 OT Time Calculation (min): 16 min  Charges: OT General Charges $OT Visit: 1 Visit OT Treatments $Therapeutic Exercise: 8-22 mins  Nestor Lewandowsky, OTR/L Acute Rehabilitation Services Pager: [phone] Office: [phone]   Kimberly Howell 03/23/2020, 2:32 PM

## 2020-03-23 NOTE — Progress Notes (Signed)
NAME:  Kimberly Howell, MRN:  [id number], DOB:  April 28, 1946, LOS: 17 ADMISSION DATE:  03/03/2020, CONSULTATION DATE:  03/23/20 REFERRING MD:  EDP, CHIEF COMPLAINT:  Cardiac arrest   Brief History   74 year old female with PMH chronic respiratory failure with COPD on 3 L home O2 and CPAP nightly, stage IV CKD, hypertension, morbid obesity who had a cardiac arrest with >45 minutes CPR before ROSC then lost pulses three more times.  Intubated and PCCM consulted for admission  Past Medical History  Morbid obesity with BMI of 50.0-59.9, adult ([place]) Chronic respiratory failure (HCC) CKD (chronic kidney disease) stage 3, GFR 30-59 ml/min (HCC) Essential hypertension OSA on CPAP  Significant Hospital Events   9/18 Admit to PCCM 9/28 Trach  10/4 Started Depakote   Consults:  Ophthamology ENT Neurology Palliative  Procedures:  9/18 ETT >> 9/28 9/21 CVC 3L >> to be taken out 9/29 post PEG 9/27 LP  9/28 Tracheostomy  9/29 IR PEG  10/6 Trach collar  Significant Diagnostic Tests:  CT head/C-spine/Maxillofacial >> Small left frontal scalp hematoma without skull fracture. Fracture of the left lamina papyracea with herniation of the left medial rectus muscle into the ethmoid sinus. Small amount of retro bulbar stranding, likely hemorrhage.  Right lung apex consolidation concerning for pneumonia. TTE 9/18 >> LVEF 60-65%; no regional wall abnormalities; G1DD, normal RV EEG 9/20 >> diffuse encephalopathy MRI brain 9/24 >> Findings are highly suspicious for hypoxic/ischemic injury. Two punctate acute infarcts within the paramedian right frontal lobe are difficult to exclude. Medially displaced acute fracture of the left lamina papyracea. There is also an acute, displaced fracture of the left fovea ethmoidalis. Orbital fat and the medial rectus muscle extend into the left ethmoid sinus. Additionally, orbital fat extends from the left ethmoid sinus through the fracture defect in the fovea ethmoidalis  intracranially into the left anterior cranial fossa. The intracranial component of orbital fat measures 1 cm and exerts mild local mass effect upon the anteroinferior left frontal lobe.   Micro Data:  9/18 Sars-CoV-2>>negative 9/18 MRSA >> positive 9/27 CSF culture >> negative 9/27 UC >> negative 9/27 BC x 2 >> negative 9/28 BAL >> negative 10/2 Tracheal aspirate >>  10/2 UA >> negative   Antimicrobials:  Unasyn 9/19 >> 9/25 Vancomycin >> 9/27 (1 dose) Ceftriaxone 9/27 >> 9/29 Cefepime 10/2 >>  Vanco 10/2 >>10  Interim history/subjective:  Remains more sleepy. Does the best when family is at bedside.  Objective   Blood pressure (!) 119/59, pulse 92, temperature 98.7 F (37.1 C), temperature source Oral, resp. rate (!) 24, height _0  (1.727 m), weight 127 kg, SpO2 99 %.    Vent Mode: PRVC FiO2 (%):  [30 %-35 %] 35 % Set Rate:  [22 bmp] 22 bmp Vt Set:  [380 mL] 380 mL PEEP:  [5 cmH20] 5 cmH20 Plateau Pressure:  [18 cmH20-19 cmH20] 18 cmH20   Intake/Output Summary (Last 24 hours) at 03/23/2020 0932 Last data filed at 03/23/2020 0800 Gross per 24 hour  Intake 1510 ml  Output 1630 ml  Net -120 ml   Filed Weights   03/21/20 0329 03/22/20 0500 03/23/20 0500  Weight: 130.8 kg 126.5 kg 127 kg   EXAM General: Chronically ill-appearing elderly woman lying in bed HEENT: Scabbed forehead bruise, eyes anicteric neuro: Winces to painful stimulation, slightly withdrawing legs.  No facial twitching today.  Awake and tracks to significant stimulation, but falls back asleep quickly. CV: S1-S2, regular rate and rhythm, no murmurs PULM: Breathing  comfortably on trach collar, could auscultation bilaterally.  Strong cough. GI: Obese, soft, nontender, nondistended. FMS in place. Extremities: No significant peripheral edema, no clubbing or cyanosis Skin: No rashes or petechiae  Resolved Problem List:  AKI on CKD- now close to euvolemic and new baseline Cr Hypernatremia   Assessment &  Plan:   Cardiac arrest with hypoxic and hypercarbic respiratory failure OHS Patient with prolonged downtime  > 45 minutes. Presumed PEA cardiac arrest. Had 4 separate events with ROSC obtained. EKG without signs of ischemia.  Slow neurological recovery, family [place] 9/27 agreed for [facility] placement.   -Continue daily trach collar trials with mechanical ventilatory support overnight. 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 No evidence of UTI on 10/8 or hypercapnia this week. -Appreciate neurology's recommendations.  Valproic acid level 52 -UA -- no evidence of UTI -Neurology following, appreciate recommendations. Con't depakote. -Monitor neurological checks Q4H -Klonopin 1 mg twice daily  Periorbital Fracture with Hyphema, stable Herniation of the left medial rectus muscle into the sinus with small amount of retro bulbar stranding.  Ophthalmologist noted patient's EOM intact and no limitations of gaze & no signs of entrapment seen.  Therefore ENT recommends to not repair orbital injury at this time. Neurosurgery consulted 9/28, stating no indication for repair unless clear CSF leak which the patient has no symptoms of.  -follow up with outpatient Ophthalmology   Anemia 2/2 chronic disease, critical illness, stable  Hb 9.3, s/p 2 unit RBC on 9/25 and 10/2  -serial monitoring  -Transfuse for Hb <7% or hemodynamically significant bleeding  Hyperglycemia  CBGs 182, 144. A1c on admission 4.9 -Continue sliding scale insulin  -levemir 12 units daily; adding TF coverage -goal BG 140-180 while admitted to ICU  HFpEF Echo Grade 1 diastolic  dysfunction -Strict I/O's  -hold additional lasix   Hypertension, stable  SBP 131/73 Home medications: Amlodipine, carvedilol, furosemide, lisinopril on hold s/p pressor requirement this admission. EF 60-65% -Strict I/O -Daily BMP -Hold all home antihypertensives   AKI, improving  Cr 1.83 -serial BMP -FWF -Avoid nephrotoxic agents, renally dose meds Constipation -PRN miralax, colace  At Risk Malnutrition  PEG Status  PEG (9/29) -TF per Nutrition   Best practice:  Diet: NPO, TF  Pain/Anxiety/Delirium protocol (if indicated): Fentanyl IVP PRN VAP protocol (if indicated): in place: HOB 30 degrees, suction as needed DVT prophylaxis: Heparin SQ/ SCDs GI prophylaxis: Protonix Glucose control: SSI & levemir BID Mobility: Bedrest, mobilize as tolerated Code Status: DNR Family Communication:  Disposition: ICU    This patient is critically ill with multiple organ system failure which requires frequent high complexity decision making, assessment, support, evaluation, and titration of therapies. This was completed through the application of advanced monitoring technologies and extensive interpretation of multiple databases. During this encounter critical care time was devoted to patient care services described in this note for 38 minutes.  Julian Hy, DO 03/23/20 5:55 PM [facility]

## 2020-03-24 DIAGNOSIS — G9340 Encephalopathy, unspecified: Secondary | ICD-10-CM | POA: Diagnosis not present

## 2020-03-24 DIAGNOSIS — Z930 Tracheostomy status: Secondary | ICD-10-CM | POA: Diagnosis not present

## 2020-03-24 DIAGNOSIS — Z9911 Dependence on respirator [ventilator] status: Secondary | ICD-10-CM | POA: Diagnosis not present

## 2020-03-24 LAB — BASIC METABOLIC PANEL
Anion gap: 14 (ref 5–15)
BUN: 78 mg/dL — ABNORMAL HIGH (ref 8–23)
CO2: 21 mmol/L — ABNORMAL LOW (ref 22–32)
Calcium: 8.5 mg/dL — ABNORMAL LOW (ref 8.9–10.3)
Chloride: 103 mmol/L (ref 98–111)
Creatinine, Ser: 1.7 mg/dL — ABNORMAL HIGH (ref 0.44–1.00)
GFR, Estimated: 30 mL/min — ABNORMAL LOW (ref >60)
Glucose, Bld: 214 mg/dL — ABNORMAL HIGH (ref 70–99)
Potassium: 4.8 mmol/L (ref 3.5–5.1)
Sodium: 138 mmol/L (ref 135–145)

## 2020-03-24 LAB — CBC
HCT: 34.8 % — ABNORMAL LOW (ref 36.0–46.0)
Hemoglobin: 10.6 g/dL — ABNORMAL LOW (ref 12.0–15.0)
MCH: 30 pg (ref 26.0–34.0)
MCHC: 30.5 g/dL (ref 30.0–36.0)
MCV: 98.6 fL (ref 80.0–100.0)
Platelets: 177 10*3/uL (ref 150–400)
RBC: 3.53 MIL/uL — ABNORMAL LOW (ref 3.87–5.11)
RDW: 15.4 % (ref 11.5–15.5)
WBC: 7.8 10*3/uL (ref 4.0–10.5)
nRBC: 0 % (ref 0.0–0.2)

## 2020-03-24 LAB — GLUCOSE, CAPILLARY
Glucose-Capillary: 186 mg/dL — ABNORMAL HIGH (ref 70–99)
Glucose-Capillary: 192 mg/dL — ABNORMAL HIGH (ref 70–99)
Glucose-Capillary: 165 mg/dL — ABNORMAL HIGH (ref 70–99)
Glucose-Capillary: 177 mg/dL — ABNORMAL HIGH (ref 70–99)
Glucose-Capillary: 189 mg/dL — ABNORMAL HIGH (ref 70–99)

## 2020-03-24 MED ORDER — ALBUTEROL SULFATE (2.5 MG/3ML) 0.083% IN NEBU
INHALATION_SOLUTION | RESPIRATORY_TRACT | Status: AC
  Administered 2020-03-24: 2.5 mg
  Filled 2020-03-24: qty 3

## 2020-03-24 MED ORDER — IPRATROPIUM-ALBUTEROL 0.5-2.5 (3) MG/3ML IN SOLN
3.0000 mL | RESPIRATORY_TRACT | Status: DC
  Administered 2020-03-24 – 2020-03-28 (×25): 3 mL via RESPIRATORY_TRACT
  Filled 2020-03-24 (×24): qty 3

## 2020-03-24 MED ORDER — ALBUTEROL SULFATE (2.5 MG/3ML) 0.083% IN NEBU: 2.5000 mg | INHALATION_SOLUTION | RESPIRATORY_TRACT | Status: DC | PRN

## 2020-03-24 NOTE — Plan of Care (Signed)
  Problem: Clinical Measurements: Goal: Ability to maintain clinical measurements within normal limits will improve Outcome: Progressing Goal: Will remain free from infection Outcome: Progressing   Problem: Coping: Goal: Level of anxiety will decrease Outcome: Progressing   Problem: Elimination: Goal: Will not experience complications related to bowel motility Outcome: Progressing Goal: Will not experience complications related to urinary retention Outcome: Progressing   Problem: Pain Managment: Goal: General experience of comfort will improve Outcome: Progressing

## 2020-03-24 NOTE — Progress Notes (Signed)
RT placed patient on ventilator with previous full support settings due to increased WOB. RN aware. RT will continue to monitor.

## 2020-03-24 NOTE — Progress Notes (Signed)
NAME:  Kimberly Howell, MRN:  [id number], DOB:  August 06, 1945, LOS: 21 ADMISSION DATE:  03/03/2020, CONSULTATION DATE:  03/24/20 REFERRING MD:  EDP, CHIEF COMPLAINT:  Cardiac arrest   Brief History   74 year old female with PMH chronic respiratory failure with COPD on 3 L home O2 and CPAP nightly, stage IV CKD, hypertension, morbid obesity who had a cardiac arrest with >45 minutes CPR before ROSC then lost pulses three more times.  Intubated and PCCM consulted for admission  Past Medical History  Morbid obesity with BMI of 50.0-59.9, adult ([place]) Chronic respiratory failure (HCC) CKD (chronic kidney disease) stage 3, GFR 30-59 ml/min (HCC) Essential hypertension OSA on CPAP  Significant Hospital Events   9/18 Admit to PCCM 9/28 Trach  10/4 Started Depakote   Consults:  Ophthamology ENT Neurology Palliative  Procedures:  9/18 ETT >> 9/28 9/21 CVC 3L >> to be taken out 9/29 post PEG 9/27 LP  9/28 Tracheostomy  9/29 IR PEG  10/6 Trach collar  Significant Diagnostic Tests:  CT head/C-spine/Maxillofacial >> Small left frontal scalp hematoma without skull fracture. Fracture of the left lamina papyracea with herniation of the left medial rectus muscle into the ethmoid sinus. Small amount of retro bulbar stranding, likely hemorrhage.  Right lung apex consolidation concerning for pneumonia. TTE 9/18 >> LVEF 60-65%; no regional wall abnormalities; G1DD, normal RV EEG 9/20 >> diffuse encephalopathy MRI brain 9/24 >> Findings are highly suspicious for hypoxic/ischemic injury. Two punctate acute infarcts within the paramedian right frontal lobe are difficult to exclude. Medially displaced acute fracture of the left lamina papyracea. There is also an acute, displaced fracture of the left fovea ethmoidalis. Orbital fat and the medial rectus muscle extend into the left ethmoid sinus. Additionally, orbital fat extends from the left ethmoid sinus through the fracture defect in the fovea ethmoidalis  intracranially into the left anterior cranial fossa. The intracranial component of orbital fat measures 1 cm and exerts mild local mass effect upon the anteroinferior left frontal lobe.   Micro Data:  9/18 Sars-CoV-2>>negative 9/18 MRSA >> positive 9/27 CSF culture >> negative 9/27 UC >> negative 9/27 BC x 2 >> negative 9/28 BAL >> negative 10/2 Tracheal aspirate >>  10/2 UA >> negative   Antimicrobials:  Unasyn 9/19 >> 9/25 Vancomycin >> 9/27 (1 dose) Ceftriaxone 9/27 >> 9/29 Cefepime 10/2 >>  Vanco 10/2 >>10  Interim history/subjective:  NAEO-- tolerated trach collar well into the night before going on vent  Increased RR this morning on ATC  Objective   Blood pressure 113/61, pulse (!) 115, temperature 98.6 F (37 C), resp. rate (!) 26, height _0  (1.727 m), weight 130.9 kg, SpO2 100 %.    Vent Mode: PRVC FiO2 (%):  [30 %-35 %] 30 % Set Rate:  [22 bmp] 22 bmp Vt Set:  [380 mL] 380 mL PEEP:  [5 cmH20] 5 cmH20 Plateau Pressure:  [20 cmH20] 20 cmH20   Intake/Output Summary (Last 24 hours) at 03/24/2020 1306 Last data filed at 03/24/2020 1000 Gross per 24 hour  Intake 1820 ml  Output 1900 ml  Net -80 ml   Filed Weights   03/22/20 0500 03/23/20 0500 03/24/20 0447  Weight: 126.5 kg 127 kg 130.9 kg   EXAM General: Chronically ill, obese older adult F, on trach collar NAD supine in bed  HEENT: Awakens to stimuli. Grimace to pain. Does not follow commands. No twitching legs.  CV: rrr s1s2 no rgm cap refill brisk  PULM: Symmetrical chest expansion. Intermittent scalene  use on ATC, upper lobe rhonchi, faint wheeze. QB:HALPF soft round ndnt  Extremities: No cyanosis or clubbing. Symmetrical muscle bulk Skin: c/d/w without rash  Resolved Problem List:  AKI on CKD- now close to euvolemic and new baseline Cr Hypernatremia   Assessment & Plan:   Cardiac arrest with hypoxic and hypercarbic respiratory failure OHS Patient with prolonged downtime  > 45 minutes. Presumed  PEA cardiac arrest. Had 4 separate events with ROSC obtained. EKG without signs of ischemia.  Slow neurological recovery, family [place] 9/27 agreed for [facility] placement.   R sided Pneumonia, improving - s/p abx course  P -AM trach collar, vent at night.  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 No evidence of UTI on 10/8 or hypercapnia this week. P -Neurology recs appreciated: continue VPA, klonopin  -Monitor neurological checks Q4H -Klonopin 1 mg twice daily  Periorbital Fracture with Hyphema, stable Herniation of the left medial rectus muscle into the sinus with small amount of retro bulbar stranding.  Ophthalmologist noted patient's EOM intact and no limitations of gaze & no signs of entrapment seen.   - ENT recommends to not repair orbital injury at this time. Neurosurgery consulted 9/28, stating no indication for repair unless clear CSF leak which the patient has no symptoms of.  P -follow up with outpatient Ophthalmology   Anemia 2/2 chronic disease, critical illness, stable  Hb 9.3, s/p 2 unit RBC on 9/25 and 10/2  -serial monitoring  -Transfuse for Hb <7% or hemodynamically significant bleeding  Hyperglycemia  CBGs 182, 144. A1c on admission 4.9 -Continue sliding scale insulin  -levemir 12 units daily; adding TF coverage -goal BG 140-180 while admitted to ICU  HFpEF Echo Grade 1 diastolic dysfunction -Strict I/O's  -hold additional lasix   Hypertension, stable  SBP 131/73 Home medications: Amlodipine, carvedilol, furosemide, lisinopril on hold s/p pressor requirement this admission. EF 60-65% P -continue to  -Strict I/O -Daily BMP  AKI,  improving  Stable Cr around 1.7  -Trend BMP -ensure renal perfusion  -minimize nephrotoxic agents   Constipation -PRN miralax, colace  At Risk Malnutrition  PEG Status  PEG (9/29) -EN per RDN    Best practice:  Diet: EN  Pain/Anxiety/Delirium protocol (if indicated): Fentanyl IVP PRN VAP protocol (if indicated): yes  DVT prophylaxis: Heparin SQ/ SCDs GI prophylaxis: Protonix Glucose control: SSI & levemir BID  Mobility: PT Code Status: DNR Family Communication: family at bedside 10/9  Disposition: ICU    CRITICAL CARE Performed by: Cristal Generous   Total critical care time: 40 minutes  Critical care time was exclusive of separately billable procedures and treating other patients.  Critical care was necessary to treat or prevent imminent or life-threatening deterioration.  Critical care was time spent personally by me on the following activities: development of treatment plan with patient and/or surrogate as well as nursing, discussions with consultants, evaluation of patient's response to treatment, examination of patient, obtaining history from patient or surrogate, ordering and performing treatments and interventions, ordering and review of laboratory studies, ordering and review of radiographic studies, pulse oximetry and re-evaluation of patient's condition.  Eliseo Gum MSN, AGACNP-BC [place] [phone]  If no answer, [phone] 03/24/2020, 1:06 PM

## 2020-03-25 ENCOUNTER — Inpatient Hospital Stay (HOSPITAL_COMMUNITY): Payer: Medicare HMO

## 2020-03-25 DIAGNOSIS — Z9911 Dependence on respirator [ventilator] status: Secondary | ICD-10-CM | POA: Diagnosis not present

## 2020-03-25 DIAGNOSIS — G9340 Encephalopathy, unspecified: Secondary | ICD-10-CM | POA: Diagnosis not present

## 2020-03-25 DIAGNOSIS — J9601 Acute respiratory failure with hypoxia: Secondary | ICD-10-CM | POA: Diagnosis not present

## 2020-03-25 DIAGNOSIS — Z930 Tracheostomy status: Secondary | ICD-10-CM | POA: Diagnosis not present

## 2020-03-25 DIAGNOSIS — G931 Anoxic brain damage, not elsewhere classified: Secondary | ICD-10-CM | POA: Diagnosis not present

## 2020-03-25 LAB — GLUCOSE, CAPILLARY
Glucose-Capillary: 154 mg/dL — ABNORMAL HIGH (ref 70–99)
Glucose-Capillary: 184 mg/dL — ABNORMAL HIGH (ref 70–99)
Glucose-Capillary: 203 mg/dL — ABNORMAL HIGH (ref 70–99)
Glucose-Capillary: 216 mg/dL — ABNORMAL HIGH (ref 70–99)
Glucose-Capillary: 228 mg/dL — ABNORMAL HIGH (ref 70–99)
Glucose-Capillary: 235 mg/dL — ABNORMAL HIGH (ref 70–99)
Glucose-Capillary: 243 mg/dL — ABNORMAL HIGH (ref 70–99)

## 2020-03-25 LAB — BASIC METABOLIC PANEL
Anion gap: 13 (ref 5–15)
BUN: 84 mg/dL — ABNORMAL HIGH (ref 8–23)
CO2: 23 mmol/L (ref 22–32)
Calcium: 8.6 mg/dL — ABNORMAL LOW (ref 8.9–10.3)
Chloride: 105 mmol/L (ref 98–111)
Creatinine, Ser: 1.69 mg/dL — ABNORMAL HIGH (ref 0.44–1.00)
GFR, Estimated: 30 mL/min — ABNORMAL LOW (ref >60)
Glucose, Bld: 217 mg/dL — ABNORMAL HIGH (ref 70–99)
Potassium: 4.6 mmol/L (ref 3.5–5.1)
Sodium: 141 mmol/L (ref 135–145)

## 2020-03-25 LAB — CBC
HCT: 32.5 % — ABNORMAL LOW (ref 36.0–46.0)
Hemoglobin: 10 g/dL — ABNORMAL LOW (ref 12.0–15.0)
MCH: 30.4 pg (ref 26.0–34.0)
MCHC: 30.8 g/dL (ref 30.0–36.0)
MCV: 98.8 fL (ref 80.0–100.0)
Platelets: 156 10*3/uL (ref 150–400)
RBC: 3.29 MIL/uL — ABNORMAL LOW (ref 3.87–5.11)
RDW: 15.3 % (ref 11.5–15.5)
WBC: 5.9 10*3/uL (ref 4.0–10.5)
nRBC: 0 % (ref 0.0–0.2)

## 2020-03-25 MED ORDER — FUROSEMIDE 40 MG PO TABS
40.0000 mg | ORAL_TABLET | Freq: Every day | ORAL | Status: DC
  Administered 2020-03-25 – 2020-03-28 (×4): 40 mg
  Filled 2020-03-25 (×4): qty 1

## 2020-03-25 MED ORDER — FUROSEMIDE 10 MG/ML PO SOLN
40.0000 mg | Freq: Every day | ORAL | Status: DC
  Filled 2020-03-25: qty 4

## 2020-03-25 MED ORDER — INSULIN DETEMIR 100 UNIT/ML ~~LOC~~ SOLN
15.0000 [IU] | Freq: Every day | SUBCUTANEOUS | Status: DC
  Administered 2020-03-26: 15 [IU] via SUBCUTANEOUS
  Filled 2020-03-25: qty 0.15

## 2020-03-25 NOTE — Progress Notes (Addendum)
Subjective: Kimberly Howell is alert with eyes open upon entering room, however she does not attend, track or follow commands. She remains trached/vented w/o any sedation. No clear neurologic progress.   Objective: Current vital signs: BP (!) 152/70   Pulse (!) 111   Temp 98.4 F (36.9 C) (Oral)   Resp (!) 22   Ht 5\' 8"  (1.727 m)   Wt 126.7 kg   SpO2 100%   BMI 42.47 kg/m  Vital signs in last 24 hours: Temp:  [98.4 F (36.9 C)-99 F (37.2 C)] 98.4 F (36.9 C) (10/10 0410) Pulse Rate:  [80-115] 111 (10/10 0700) Resp:  [15-32] 22 (10/10 0700) BP: (88-153)/(42-87) 152/70 (10/10 0700) SpO2:  [94 %-100 %] 100 % (10/10 0700) FiO2 (%):  [30 %-40 %] 40 % (10/10 0400) Weight:  [126.7 kg] 126.7 kg (10/10 0500)  Intake/Output from previous day: 10/09 0701 - 10/10 0700 In: 2800 [NG/GT:2700; IV Piggyback:100] Out: 950 [Urine:700; Stool:250] Intake/Output this shift: No intake/output data recorded. Nutritional status:  Diet Order    None      Neurologic Exam: Mental Status: Alert with eyes open. However does not track or attend or follow any commands. Grimaces to noxious stimuli.   CN: II: PERRL III, IV, VI: Unable to assess full EOM, seems to have a right gaze, but not clearly looking left or right. Blinks to threat bilat VII: grimacing to noxious stimuli with small amount of facial twitching with grimace  VIII: winces to clap but otherwise not responding  Motor: Moving upper and lower extremities, no myoclonus Sensory: Not withdrawing or grimacing to pain with upper extremities Moves feet slightly with LE noxious stimuli  Lab Results: Results for orders placed or performed during the hospital encounter of 03/03/20 (from the past 48 hour(s))  Glucose, capillary     Status: Abnormal   Collection Time: 03/23/20  8:14 AM  Result Value Ref Range   Glucose-Capillary 190 (H) 70 - 99 mg/dL    Comment: Glucose reference range applies only to samples taken after fasting for at least  8 hours.  Valproic acid level     Status: None   Collection Time: 03/23/20  9:41 AM  Result Value Ref Range   Valproic Acid Lvl 52 50.0 - 100.0 ug/mL    Comment: Performed at [facility] [address], [place], [place] [address]  Glucose, capillary     Status: Abnormal   Collection Time: 03/23/20 11:59 AM  Result Value Ref Range   Glucose-Capillary 209 (H) 70 - 99 mg/dL    Comment: Glucose reference range applies only to samples taken after fasting for at least 8 hours.  Urinalysis, Routine w reflex microscopic Urine, Catheterized     Status: Abnormal   Collection Time: 03/23/20  3:22 PM  Result Value Ref Range   Color, Urine YELLOW YELLOW   APPearance CLEAR CLEAR   Specific Gravity, Urine 1.010 1.005 - 1.030   pH 5.0 5.0 - 8.0   Glucose, UA 50 (A) NEGATIVE mg/dL   Hgb urine dipstick MODERATE (A) NEGATIVE   Bilirubin Urine NEGATIVE NEGATIVE   Ketones, ur NEGATIVE NEGATIVE mg/dL   Protein, ur 30 (A) NEGATIVE mg/dL   Nitrite NEGATIVE NEGATIVE   Leukocytes,Ua NEGATIVE NEGATIVE   RBC / HPF 6-10 0 - 5 RBC/hpf   WBC, UA 0-5 0 - 5 WBC/hpf   Bacteria, UA RARE (A) NONE SEEN   Squamous Epithelial / LPF 0-5 0 - 5   Mucus PRESENT  Comment: Performed at [facility], [place] [address], [place], [place] [address]  Glucose, capillary     Status: Abnormal   Collection Time: 03/23/20  3:51 PM  Result Value Ref Range   Glucose-Capillary 186 (H) 70 - 99 mg/dL    Comment: Glucose reference range applies only to samples taken after fasting for at least 8 hours.  Glucose, capillary     Status: Abnormal   Collection Time: 03/23/20  8:00 PM  Result Value Ref Range   Glucose-Capillary 204 (H) 70 - 99 mg/dL    Comment: Glucose reference range applies only to samples taken after fasting for at least 8 hours.  Glucose, capillary     Status: Abnormal   Collection Time: 03/23/20 11:54 PM  Result Value Ref Range   Glucose-Capillary 186 (H) 70 - 99 mg/dL    Comment: Glucose reference  range applies only to samples taken after fasting for at least 8 hours.  Basic metabolic panel     Status: Abnormal   Collection Time: 03/24/20  1:16 AM  Result Value Ref Range   Sodium 138 135 - 145 mmol/L   Potassium 4.8 3.5 - 5.1 mmol/L   Chloride 103 98 - 111 mmol/L   CO2 21 (L) 22 - 32 mmol/L   Glucose, Bld 214 (H) 70 - 99 mg/dL    Comment: Glucose reference range applies only to samples taken after fasting for at least 8 hours.   BUN 78 (H) 8 - 23 mg/dL   Creatinine, Ser 1.70 (H) 0.44 - 1.00 mg/dL   Calcium 8.5 (L) 8.9 - 10.3 mg/dL   GFR, Estimated 30 (L) >60 mL/min   Anion gap 14 5 - 15    Comment: Performed at [facility] [address] [place], [place] [address]  CBC     Status: Abnormal   Collection Time: 03/24/20  1:16 AM  Result Value Ref Range   WBC 7.8 4.0 - 10.5 K/uL   RBC 3.53 (L) 3.87 - 5.11 MIL/uL   Hemoglobin 10.6 (L) 12.0 - 15.0 g/dL   HCT 34.8 (L) 36 - 46 %   MCV 98.6 80.0 - 100.0 fL   MCH 30.0 26.0 - 34.0 pg   MCHC 30.5 30.0 - 36.0 g/dL   RDW 15.4 11.5 - 15.5 %   Platelets 177 150 - 400 K/uL   nRBC 0.0 0.0 - 0.2 %    Comment: Performed at [facility], [place] [address] [place], [place] [address]  Glucose, capillary     Status: Abnormal   Collection Time: 03/24/20  4:16 AM  Result Value Ref Range   Glucose-Capillary 186 (H) 70 - 99 mg/dL    Comment: Glucose reference range applies only to samples taken after fasting for at least 8 hours.  Glucose, capillary     Status: Abnormal   Collection Time: 03/24/20  8:11 AM  Result Value Ref Range   Glucose-Capillary 192 (H) 70 - 99 mg/dL    Comment: Glucose reference range applies only to samples taken after fasting for at least 8 hours.  Glucose, capillary     Status: Abnormal   Collection Time: 03/24/20 11:57 AM  Result Value Ref Range   Glucose-Capillary 165 (H) 70 - 99 mg/dL    Comment: Glucose reference range applies only to samples taken after fasting for at least 8 hours.  Glucose,  capillary     Status: Abnormal   Collection Time: 03/24/20  3:50 PM  Result  Value Ref Range   Glucose-Capillary 177 (H) 70 - 99 mg/dL    Comment: Glucose reference range applies only to samples taken after fasting for at least 8 hours.  Glucose, capillary     Status: Abnormal   Collection Time: 03/24/20  8:29 PM  Result Value Ref Range   Glucose-Capillary 189 (H) 70 - 99 mg/dL    Comment: Glucose reference range applies only to samples taken after fasting for at least 8 hours.  Glucose, capillary     Status: Abnormal   Collection Time: 03/25/20 12:33 AM  Result Value Ref Range   Glucose-Capillary 228 (H) 70 - 99 mg/dL    Comment: Glucose reference range applies only to samples taken after fasting for at least 8 hours.  CBC     Status: Abnormal   Collection Time: 03/25/20  3:29 AM  Result Value Ref Range   WBC 5.9 4.0 - 10.5 K/uL   RBC 3.29 (L) 3.87 - 5.11 MIL/uL   Hemoglobin 10.0 (L) 12.0 - 15.0 g/dL   HCT 32.5 (L) 36 - 46 %   MCV 98.8 80.0 - 100.0 fL   MCH 30.4 26.0 - 34.0 pg   MCHC 30.8 30.0 - 36.0 g/dL   RDW 15.3 11.5 - 15.5 %   Platelets 156 150 - 400 K/uL   nRBC 0.0 0.0 - 0.2 %    Comment: Performed at [facility], [place] [address] [place], [place] [address]  Basic metabolic panel     Status: Abnormal   Collection Time: 03/25/20  3:29 AM  Result Value Ref Range   Sodium 141 135 - 145 mmol/L   Potassium 4.6 3.5 - 5.1 mmol/L   Chloride 105 98 - 111 mmol/L   CO2 23 22 - 32 mmol/L   Glucose, Bld 217 (H) 70 - 99 mg/dL    Comment: Glucose reference range applies only to samples taken after fasting for at least 8 hours.   BUN 84 (H) 8 - 23 mg/dL   Creatinine, Ser 1.69 (H) 0.44 - 1.00 mg/dL   Calcium 8.6 (L) 8.9 - 10.3 mg/dL   GFR, Estimated 30 (L) >60 mL/min   Anion gap 13 5 - 15    Comment: Performed at [facility] [address] [place], [place] [address]  Glucose, capillary     Status: Abnormal   Collection Time: 03/25/20  4:08 AM  Result Value Ref Range    Glucose-Capillary 216 (H) 70 - 99 mg/dL    Comment: Glucose reference range applies only to samples taken after fasting for at least 8 hours.    Recent Results (from the past 240 hour(s))  Culture, respiratory (non-expectorated)     Status: None   Collection Time: 03/17/20  3:07 PM   Specimen: Tracheal Aspirate; Respiratory  Result Value Ref Range Status   Specimen Description TRACHEAL ASPIRATE  Final   Special Requests NONE  Final   Gram Stain   Final    NO WBC SEEN NO ORGANISMS SEEN Performed at [facility], [address] [place], [place] [address]    Culture RARE CANDIDA ALBICANS  Final   Report Status 03/20/2020 FINAL  Final    Lipid Panel No results for input(s): CHOL, TRIG, HDL, CHOLHDL, VLDL, LDLCALC in the last 72 hours.  Studies/Results: No results found.  Medications:  Scheduled: . chlorhexidine  15 mL Mouth Rinse BID  . Chlorhexidine Gluconate Cloth  6 each Topical Daily  . clonazepam  1  mg Per Tube BID  . feeding supplement (PROSource TF)  45 mL Per Tube Daily  . free water  200 mL Per Tube Q4H  . heparin  5,000 Units Subcutaneous Q8H  . influenza vaccine adjuvanted  0.5 mL Intramuscular Tomorrow-1000  . insulin aspart  0-15 Units Subcutaneous Q4H  . insulin aspart  2 Units Subcutaneous Q4H  . insulin detemir  12 Units Subcutaneous Daily  . ipratropium-albuterol  3 mL Nebulization Q4H  . mouth rinse  15 mL Mouth Rinse q12n4p  . melatonin  3 mg Per Tube QHS  . pantoprazole sodium  40 mg Per Tube QHS  . pneumococcal 23 valent vaccine  0.5 mL Intramuscular Tomorrow-1000  . valproic acid  1,250 mg Per Tube TID   Continuous: . sodium chloride 250 mL (03/24/20 1757)  . cefTRIAXone (ROCEPHIN)  IV 2 g (03/24/20 2120)  . feeding supplement (VITAL AF 1.2 CAL) 1,000 mL (03/25/20 0543)   Neurology NP assisting in the care of this patient today: Desiree Metzger-Cihelka, ARNP-C, ANVP-BC Pager: [phone]  Assessment:62 year oldfemale with TIIDM,  CKD, morbid obesity, COPD and chronic hypoxic respiratory failure on supplemental O2 admitted after collapsing at home with cardiac arrest, undergoing 45 minutes of CPR with ROSC. MRI on9/26showed symmetric DWI abnormality suggestive ofanoxic brain injury within the medial temporal lobes and cerebellum with possible punctate acute infarct of paramedian right frontal lobe.She developed worseningfacial, body, and extremity twitchingon Monday (10/4)after sedation was decreased. Twitchinghad also beenseen about one weekprior to that. Myoclonus significantly improved with Depakote.  -- Exam today shows a semi-conscious state, but not interacting or following commands. --Patient has shown littleclinicalimprovementin terms of her level of consciousness during herhospital course. Remains in a semiconscious state. -- Spot EEGon Monday (10/4)revealedgeneralized continuous slowing. Thestudywas suggestive of moderate diffuse encephalopathy, nonspecifictoetiology.Nodefiniteseizures or epileptiform discharges were seen throughout the recording.Of note, the patient was noted to have episodes of facial twitching without concomitantEEGchange. However,focal motor seizures may not be seen on scalp eeg. Therefore, clinical correlation was recommended.EEG-negative myoclonus is also in the differential. -- Depakote was startedon Monday(10/4)and increased to 10 mg/kg TID on Wednesday (10/6). VPA level on Friday was therapeutic at 71.  --Due to concern for auto tapering off previous midazolam while on mechanical ventilation as the possible etiology for reemergence of previously suppressedtremors or myoclonus,Klonopin 1 mg twice daily was started by CCM 10/6.   Recommendations: -ContinueDepakoteat1250mg  TID via PEG.  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 It may not resolvecompletelywith anticonvulsant therapy.  -Long-term prognosis for meaningful neurological  recovery wasimproving due to increased level of alertness and resolving myoclonus with VPA earlier this week, but may now be plateauing and have no further improvement in wakefulness or interaction with others.  - Family wishes are for maximum medical therapy and eventual discharge to SNF - Neurology will sign off. Please call if there are additional questions.    LOS: 22 days   35 minutes spent in the neurological evaluation and management of this critically ill patient.   Electronically signed: Dr. Kerney Elbe

## 2020-03-25 NOTE — Progress Notes (Signed)
CPT held at this time, pt getting bath. RT will continue to monitor.

## 2020-03-25 NOTE — Progress Notes (Signed)
NAME:  Kimberly Howell, MRN:  [id number], DOB:  1946/06/14, LOS: 63 ADMISSION DATE:  03/03/2020, CONSULTATION DATE:  03/25/20 REFERRING MD:  EDP, CHIEF COMPLAINT:  Cardiac arrest   Brief History   74 year old female with PMH chronic respiratory failure with COPD on 3 L home O2 and CPAP nightly, stage IV CKD, hypertension, morbid obesity who had a cardiac arrest with >45 minutes CPR before ROSC then lost pulses three more times.  Intubated and PCCM consulted for admission  Past Medical History  Morbid obesity with BMI of 50.0-59.9, adult ([place]) Chronic respiratory failure (HCC) CKD (chronic kidney disease) stage 3, GFR 30-59 ml/min (HCC) Essential hypertension OSA on CPAP  Significant Hospital Events   9/18 Admit to PCCM 9/28 Trach  10/4 Started Depakote   Consults:  Ophthamology ENT Neurology Palliative  Procedures:  9/18 ETT >> 9/28 9/21 CVC 3L >> to be taken out 9/29 post PEG 9/27 LP  9/28 Tracheostomy  9/29 IR PEG  10/6 Trach collar  Significant Diagnostic Tests:  CT head/C-spine/Maxillofacial >> Small left frontal scalp hematoma without skull fracture. Fracture of the left lamina papyracea with herniation of the left medial rectus muscle into the ethmoid sinus. Small amount of retro bulbar stranding, likely hemorrhage.  Right lung apex consolidation concerning for pneumonia. TTE 9/18 >> LVEF 60-65%; no regional wall abnormalities; G1DD, normal RV EEG 9/20 >> diffuse encephalopathy MRI brain 9/24 >> Findings are highly suspicious for hypoxic/ischemic injury. Two punctate acute infarcts within the paramedian right frontal lobe are difficult to exclude. Medially displaced acute fracture of the left lamina papyracea. There is also an acute, displaced fracture of the left fovea ethmoidalis. Orbital fat and the medial rectus muscle extend into the left ethmoid sinus. Additionally, orbital fat extends from the left ethmoid sinus through the fracture defect in the fovea ethmoidalis  intracranially into the left anterior cranial fossa. The intracranial component of orbital fat measures 1 cm and exerts mild local mass effect upon the anteroinferior left frontal lobe.   Micro Data:  9/18 Sars-CoV-2>>negative 9/18 MRSA >> positive 9/27 CSF culture >> negative 9/27 UC >> negative 9/27 BC x 2 >> negative 9/28 BAL >> negative 10/2 Tracheal aspirate >>  10/2 UA >> negative   Antimicrobials:  Unasyn 9/19 >> 9/25 Vancomycin >> 9/27 (1 dose) Ceftriaxone 9/27 >> 9/29 Cefepime 10/2 >>  Vanco 10/2 >>10  Interim history/subjective:  No acute events overnight.  Objective   Blood pressure 139/71, pulse (!) 110, temperature 98.2 F (36.8 C), resp. rate (!) 27, height _0  (1.727 m), weight 126.7 kg, SpO2 100 %.    Vent Mode: PRVC FiO2 (%):  [40 %] 40 % Set Rate:  [22 bmp] 22 bmp Vt Set:  [380 mL] 380 mL PEEP:  [5 cmH20] 5 cmH20 Pressure Support:  [10 cmH20] 10 cmH20 Plateau Pressure:  [18 cmH20-25 cmH20] 18 cmH20   Intake/Output Summary (Last 24 hours) at 03/25/2020 1856 Last data filed at 03/25/2020 1626 Gross per 24 hour  Intake 2297 ml  Output 650 ml  Net 1647 ml   Filed Weights   03/23/20 0500 03/24/20 0447 03/25/20 0500  Weight: 127 kg 130.9 kg 126.7 kg   EXAM General: chronically ill appearing woman laying in bed in NAD, trached on MV HEENT: bruise on forehead, otherwise normocephalic CV: RRR, no murmurs PULM: breathing synchronously with MV, CTAB, distant breath sounds GI: obese, soft, NT, ND. PEG w/o erythema. Extremities: no c/c/e Skin: no rashes or wounds   CXR: cardiomegaly, likely  atelectasis in bases  Resolved Problem List:  AKI on CKD- now close to euvolemic and new baseline Cr Hypernatremia   Assessment & Plan:   Cardiac arrest with hypoxic and hypercarbic respiratory failure OHS Patient with prolonged downtime  > 45 minutes. Presumed PEA cardiac arrest. Had 4 separate events with ROSC obtained. EKG without signs of ischemia.  Slow  neurological recovery, family [place] 9/27 agreed for [facility] placement.   -needs aggressive rehabilitation  Acute on chronic respiratory failure with hypoxia R sided Pneumonia, improving - s/p abx course  -con't vent weaning. LTVV at night, TCT during the day. If unable to tolerate TCT, then CPAP / PS trials. 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 No evidence of UTI on 10/8 or hypercapnia this week. P -Neurology recs appreciated: continue VPA -can try to d/c klonopin; previously due to concern for tremors merging after benzo withdrawal -Monitor neurological checks    Periorbital Fracture with Hyphema, stable Herniation of the left medial rectus muscle into the sinus with small amount of retro bulbar stranding.  Ophthalmologist noted patient's EOM intact and no limitations of gaze & no signs of entrapment seen.   - ENT recommends to not repair orbital injury at this time. Neurosurgery consulted 9/28, stating no indication for repair unless clear CSF leak which the patient has no symptoms of.  P -follow up with outpatient Ophthalmology   Anemia 2/2 chronic disease, critical illness, stable  Hb 9.3, s/p 2 unit RBC on 9/25 and 10/2  -serial monitoring  -Transfuse for Hb <7% or hemodynamically significant bleeding  Hyperglycemia  CBGs 182, 144. A1c on admission 4.9 -Continue sliding scale insulin  -levemir increased to 15 units daily; con't  -goal BG 140-180 while admitted to ICU  HFpEF, baseline HTN Echo Grade 1 diastolic dysfunction -Strict I/O's  -home meds: Amlodipine, carvedilol, furosemide, lisinopril on hold s/p pressor requirement this admission. EF 60-65% -Strict I/O  AKI, improving  Stable Cr around 1.7  -Trend BMP -ensure renal perfusion   -minimize nephrotoxic agents   Constipation -PRN miralax, colace  At Risk Malnutrition  PEG Status  PEG (9/29) -EN per RDN    Best practice:  Diet: EN  Pain/Anxiety/Delirium protocol (if indicated): Fentanyl IVP PRN VAP protocol (if indicated): yes  DVT prophylaxis: Heparin SQ/ SCDs GI prophylaxis: Protonix Glucose control: SSI & levemir BID  Mobility: PT Code Status: DNR Family Communication: family at bedside 10/9  Disposition: ICU   This patient is critically ill with multiple organ system failure which requires frequent high complexity decision making, assessment, support, evaluation, and titration of therapies. This was completed through the application of advanced monitoring technologies and extensive interpretation of multiple databases. During this encounter critical care time was devoted to patient care services described in this note for 38 minutes.  Julian Hy, DO 03/25/20 7:05 PM [facility]

## 2020-03-26 ENCOUNTER — Inpatient Hospital Stay (HOSPITAL_COMMUNITY): Payer: Medicare HMO

## 2020-03-26 DIAGNOSIS — J9601 Acute respiratory failure with hypoxia: Secondary | ICD-10-CM | POA: Diagnosis not present

## 2020-03-26 DIAGNOSIS — Z930 Tracheostomy status: Secondary | ICD-10-CM | POA: Diagnosis not present

## 2020-03-26 DIAGNOSIS — G931 Anoxic brain damage, not elsewhere classified: Secondary | ICD-10-CM | POA: Diagnosis not present

## 2020-03-26 DIAGNOSIS — Z515 Encounter for palliative care: Secondary | ICD-10-CM | POA: Diagnosis not present

## 2020-03-26 LAB — CBC
HCT: 31 % — ABNORMAL LOW (ref 36.0–46.0)
Hemoglobin: 9.4 g/dL — ABNORMAL LOW (ref 12.0–15.0)
MCH: 30.1 pg (ref 26.0–34.0)
MCHC: 30.3 g/dL (ref 30.0–36.0)
MCV: 99.4 fL (ref 80.0–100.0)
Platelets: 134 K/uL — ABNORMAL LOW (ref 150–400)
RBC: 3.12 MIL/uL — ABNORMAL LOW (ref 3.87–5.11)
RDW: 15.8 % — ABNORMAL HIGH (ref 11.5–15.5)
WBC: 8.4 K/uL (ref 4.0–10.5)
nRBC: 0.2 % (ref 0.0–0.2)

## 2020-03-26 LAB — BASIC METABOLIC PANEL
Anion gap: 16 — ABNORMAL HIGH (ref 5–15)
BUN: 89 mg/dL — ABNORMAL HIGH (ref 8–23)
CO2: 19 mmol/L — ABNORMAL LOW (ref 22–32)
Calcium: 8.7 mg/dL — ABNORMAL LOW (ref 8.9–10.3)
Chloride: 106 mmol/L (ref 98–111)
Creatinine, Ser: 1.74 mg/dL — ABNORMAL HIGH (ref 0.44–1.00)
GFR, Estimated: 29 mL/min — ABNORMAL LOW (ref >60)
Glucose, Bld: 216 mg/dL — ABNORMAL HIGH (ref 70–99)
Potassium: 5 mmol/L (ref 3.5–5.1)
Sodium: 141 mmol/L (ref 135–145)

## 2020-03-26 LAB — GLUCOSE, CAPILLARY
Glucose-Capillary: 147 mg/dL — ABNORMAL HIGH (ref 70–99)
Glucose-Capillary: 165 mg/dL — ABNORMAL HIGH (ref 70–99)
Glucose-Capillary: 174 mg/dL — ABNORMAL HIGH (ref 70–99)
Glucose-Capillary: 189 mg/dL — ABNORMAL HIGH (ref 70–99)
Glucose-Capillary: 254 mg/dL — ABNORMAL HIGH (ref 70–99)
Glucose-Capillary: 271 mg/dL — ABNORMAL HIGH (ref 70–99)

## 2020-03-26 LAB — VALPROIC ACID LEVEL: Valproic Acid Lvl: 78 ug/mL (ref 50.0–100.0)

## 2020-03-26 LAB — PROTIME-INR
INR: 1 (ref 0.8–1.2)
Prothrombin Time: 13.1 seconds (ref 11.4–15.2)

## 2020-03-26 LAB — BETA-HYDROXYBUTYRIC ACID: Beta-Hydroxybutyric Acid: 0.19 mmol/L (ref 0.05–0.27)

## 2020-03-26 LAB — LACTIC ACID, PLASMA
Lactic Acid, Venous: 1.8 mmol/L (ref 0.5–1.9)
Lactic Acid, Venous: 1.6 mmol/L (ref 0.5–1.9)

## 2020-03-26 LAB — APTT: aPTT: 28 s (ref 24–36)

## 2020-03-26 MED ORDER — INSULIN DETEMIR 100 UNIT/ML ~~LOC~~ SOLN
18.0000 [IU] | Freq: Every day | SUBCUTANEOUS | Status: DC
  Administered 2020-03-27 – 2020-04-11 (×16): 18 [IU] via SUBCUTANEOUS
  Filled 2020-03-26 (×17): qty 0.18

## 2020-03-26 MED ORDER — MODAFINIL 100 MG PO TABS
100.0000 mg | ORAL_TABLET | Freq: Every day | ORAL | Status: DC
  Administered 2020-03-26 – 2020-04-11 (×17): 100 mg
  Filled 2020-03-26 (×17): qty 1

## 2020-03-26 MED ORDER — INSULIN ASPART 100 UNIT/ML ~~LOC~~ SOLN
6.0000 [IU] | SUBCUTANEOUS | Status: DC
  Administered 2020-03-26 – 2020-04-11 (×90): 6 [IU] via SUBCUTANEOUS

## 2020-03-26 MED ORDER — ORAL CARE MOUTH RINSE
15.0000 mL | OROMUCOSAL | Status: DC
  Administered 2020-03-26 – 2020-04-11 (×145): 15 mL via OROMUCOSAL

## 2020-03-26 MED ORDER — ALBUMIN HUMAN 25 % IV SOLN
25.0000 g | Freq: Four times a day (QID) | INTRAVENOUS | Status: AC
  Administered 2020-03-26 – 2020-03-27 (×4): 25 g via INTRAVENOUS
  Filled 2020-03-26 (×4): qty 100

## 2020-03-26 NOTE — Progress Notes (Signed)
eLink Physician-Brief Progress Note Patient Name: Kimberly Howell DOB: 1946/05/17 MRN: [id number]   Date of Service  03/26/2020  HPI/Events of Note  Hemoptysis - BRB being suctioned from ETT. % AM labs being sent early.   eICU Interventions  Plan: 2. PT/INR and PTT STAT. 2. Portable CXR STAT.     Intervention Category Major Interventions: Other:  Jeniel Slauson Cornelia Copa 03/26/2020, 4:07 AM

## 2020-03-26 NOTE — Progress Notes (Signed)
SLP Cancellation Note  Patient Details Name: Kimberly Howell MRN: [id number] DOB: 1946-05-26   Cancelled treatment:       Reason Eval/Treat Not Completed: Medical issues which prohibited therapy (Pt on the vent at this time. SLP will follow up. )  Avrian Delfavero I. Hardin Negus, [place], [place] Office number [phone] Pager [place] 03/26/2020, 3:09 PM

## 2020-03-26 NOTE — Progress Notes (Addendum)
NAME:  Kimberly Howell, MRN:  [id number], DOB:  08-28-1945, LOS: 74 ADMISSION DATE:  03/03/2020, CONSULTATION DATE:  03/26/20 REFERRING MD:  EDP, CHIEF COMPLAINT:  Cardiac arrest   Brief History   74 year old female with PMH chronic respiratory failure with COPD on 3 L home O2 and CPAP nightly, stage IV CKD, hypertension, morbid obesity who had a cardiac arrest with >45 minutes CPR before ROSC then lost pulses three more times.  Intubated and PCCM consulted for admission  Past Medical History  Morbid obesity with BMI of 50.0-59.9, adult ([place]) Chronic respiratory failure (HCC) CKD (chronic kidney disease) stage 3, GFR 30-59 ml/min (HCC) Essential hypertension OSA on CPAP  Significant Hospital Events   9/18 Admit to PCCM 9/28 Trach  10/4 Started Depakote   Consults:  Ophthamology ENT Neurology Palliative  Procedures:  9/18 ETT >> 9/28 9/21 CVC 3L >> to be taken out 9/29 post PEG 9/27 LP  9/28 Tracheostomy  9/29 IR PEG  10/6 Trach collar  Significant Diagnostic Tests:  CT head/C-spine/Maxillofacial >> Small left frontal scalp hematoma without skull fracture. Fracture of the left lamina papyracea with herniation of the left medial rectus muscle into the ethmoid sinus. Small amount of retro bulbar stranding, likely hemorrhage.  Right lung apex consolidation concerning for pneumonia. TTE 9/18 >> LVEF 60-65%; no regional wall abnormalities; G1DD, normal RV EEG 9/20 >> diffuse encephalopathy MRI brain 9/24 >> Findings are highly suspicious for hypoxic/ischemic injury. Two punctate acute infarcts within the paramedian right frontal lobe are difficult to exclude. Medially displaced acute fracture of the left lamina papyracea. There is also an acute, displaced fracture of the left fovea ethmoidalis. Orbital fat and the medial rectus muscle extend into the left ethmoid sinus. Additionally, orbital fat extends from the left ethmoid sinus through the fracture defect in the fovea ethmoidalis  intracranially into the left anterior cranial fossa. The intracranial component of orbital fat measures 1 cm and exerts mild local mass effect upon the anteroinferior left frontal lobe.   Micro Data:  9/18 Sars-CoV-2>>negative 9/18 MRSA >> positive 9/27 CSF culture >> negative 9/27 UC >> negative 9/27 BC x 2 >> negative 9/28 BAL >> negative 10/2 Tracheal aspirate >>  10/2 UA >> negative   Antimicrobials:  Unasyn 9/19 >> 9/25 Vancomycin >> 9/27 (1 dose) Ceftriaxone 9/27 >> 9/29 Cefepime 10/2 >>  Vanco 10/2 >>10  Interim history/subjective:  Lying in bed. No distress low grade temp last night   Objective   Blood pressure 134/64, pulse (Abnormal) 120, temperature 100.2 F (37.9 C), temperature source Oral, resp. rate (Abnormal) 21, height _0  (1.727 m), weight 125.1 kg, SpO2 97 %.    Vent Mode: PRVC FiO2 (%):  [40 %] 40 % Set Rate:  [22 bmp] 22 bmp Vt Set:  [380 mL] 380 mL PEEP:  [5 cmH20-8 cmH20] 8 cmH20 Plateau Pressure:  [16 cmH20-18 cmH20] 16 cmH20   Intake/Output Summary (Last 24 hours) at 03/26/2020 0841 Last data filed at 03/26/2020 0700 Gross per 24 hour  Intake 2900 ml  Output 1150 ml  Net 1750 ml   Filed Weights   03/24/20 0447 03/25/20 0500 03/26/20 0500  Weight: 130.9 kg 126.7 kg 125.1 kg   EXAM General this is a obese black female. She is lying in bed. No distress but remains on full vent support  HENT large bruise on forehead. Trach 6 prox XLT w/ bloody tracheal secretions pulm currently on full vent support PEEP 8/fio2 40% pplat 20  Card RRR abd  soft PEG unremarkable Ext both feet in boots. Warm chronic venous stasis changes Neuro opens eyes to voice no movement   Resolved Problem List:  AKI on CKD- now close to euvolemic and new baseline Cr Hypernatremia  Cardiac arrest with hypoxic and hypercarbic respiratory failure OHS Patient with prolonged downtime  > 45 minutes. 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  Ophthalmologist noted patient's EOM intact and no limitations of gaze & no signs of entrapment seen.   - ENT recommends to not repair orbital injury at this time. Neurosurgery consulted 9/28, stating no indication for repair unless clear CSF leak which the patient has no symptoms of.  Plan F/u with outpt optho   Anemia 2/2 chronic disease, critical illness, stable  Hb 9.3, s/p 2 unit RBC on 9/25 and 10/2  Plan Trend cbc Transfuse for hgb < 7  Hyperglycemia  A1c on admission 4.9 ->glycemic control still poor Plan ssi goal glucose 140-180 Inc levemir to 18 units daily Add scheduled coverage   HFpEF, baseline HTN Echo Grade 1 diastolic dysfunction Plan Cont strict I&O Holding his home amlodipine, carvedilol, lasix and ace-I    AKI Stable Cr around 1.7  Plan Trend chems  Keep euvolemic Renal dose meds   Mild anion gap metabolic acidosis Plan Ck lactate Ck betahydroxybuteric acid  Constipation Plan Cont PRN LOC   At Risk Malnutrition  PEG Status  PEG (9/29) Plan Cont supplemental tubefeeds as directed by nutritional support   Best practice:  Diet: EN  Pain/Anxiety/Delirium protocol (if indicated): Fentanyl IVP PRN VAP protocol (if indicated): yes  DVT prophylaxis: Heparin SQ/ SCDs GI prophylaxis: Protonix Glucose control: SSI &  levemir BID  Mobility: PT Code Status: DNR Family Communication: family at bedside 10/9  Disposition: ICU  My cct 32 min  Erick Colace ACNP-BC [place] Pager # [phone] OR # [phone] if no answer

## 2020-03-26 NOTE — TOC Progression Note (Signed)
Transition of Care [facility]) - Progression Note    Patient Details  Name: NONIE LOCHNER MRN: [id number] Date of Birth: 10/13/1945  Transition of Care [facility]) CM/SW [place], [place] Phone Number: 03/26/2020, 2:20 PM  Clinical Narrative:      CSW spoke with Myriam Jacobson with [facility] to follow up with her on possible SNF placement for patient.CSW let Myriam Jacobson know we are just waiting on caseworker information for medicaid for patient. CSW let her know she will get that information to her as soon as CSW receives it from [place] counseling.  CSW will continue to follow. Expected Discharge Plan: Skilled Nursing Facility Barriers to Discharge: Continued Medical Work up  Expected Discharge Plan and Services Expected Discharge Plan: [place]   Discharge Planning Services: CM Consult Post Acute Care Choice: Long Term Acute Care (LTAC) Living arrangements for the past 2 months: Single Family Home                                       Social Determinants of Health (SDOH) Interventions    Readmission Risk Interventions Readmission Risk Prevention Plan 03/14/2020  Transportation Screening Complete  PCP or Specialist Appt within 5-7 Days Complete  Home Care Screening Complete  Medication Review (RN CM) Complete

## 2020-03-26 NOTE — Progress Notes (Signed)
Patient ID: Kimberly Howell, female   DOB: 1945/08/18, 74 y.o.   MRN: [id number]  This NP reviewed medical records, discussed case with team and then visited patient at the bedside as a follow for palliative medicine needs and emotional support.  Patient is s/p prolonged PEA arrest, today is day 27 of this hospitalization. She remains vent dependant, unfortunately neurological she does not attend, track or follow commands.   Spoke to son/Kimberly Howell and patient's sister/Kimberly Howell by telephone to create space and opportunity for family to explore thoughts and feelings regarding the patient's current medical situation.   Education offered regarding current  medical condition, questions and concerns addressed to the best of my ability.   Suggested a family meeting in order to update family on medical condition, treatment options, advanced directives and anticipatory care needs.   Family agreeable and appreciative of suggestion,  Meet coordinated for  tomorrow at 12:00.  Spoke to Marni Griffon NP with CCM who will be present and Dr Khaliqdina/neurology.   Discussed with family the importance of continued conversation with each other and the  medical providers regarding overall plan of care and treatment options,  ensuring decisions are within the context of the patients values and GOCs.  Total time spent on the unit was 35 minutes  Greater than 50% of the time was spent in counseling and coordination of care  Wadie Lessen NP  Palliative Medicine Team Team Phone # [phone] Pager [phone]

## 2020-03-26 NOTE — Progress Notes (Signed)
eLink Physician-Brief Progress Note Patient Name: Kimberly Howell DOB: 16-Jun-1946 MRN: [id number]   Date of Service  03/26/2020  HPI/Events of Note  Review of CXR reveals Increasing bibasilar airspace consolidation. Hgb = 9.4.  eICU Interventions  Plan: 1. Increase PEEP to 8.     Intervention Category Major Interventions: Other:  Teran Daughenbaugh Cornelia Copa 03/26/2020, 5:09 AM

## 2020-03-27 ENCOUNTER — Inpatient Hospital Stay (HOSPITAL_COMMUNITY): Payer: Medicare HMO

## 2020-03-27 DIAGNOSIS — G931 Anoxic brain damage, not elsewhere classified: Secondary | ICD-10-CM

## 2020-03-27 DIAGNOSIS — Z9911 Dependence on respirator [ventilator] status: Secondary | ICD-10-CM

## 2020-03-27 DIAGNOSIS — Z515 Encounter for palliative care: Secondary | ICD-10-CM | POA: Diagnosis not present

## 2020-03-27 DIAGNOSIS — J9601 Acute respiratory failure with hypoxia: Secondary | ICD-10-CM | POA: Diagnosis not present

## 2020-03-27 DIAGNOSIS — I469 Cardiac arrest, cause unspecified: Secondary | ICD-10-CM | POA: Diagnosis not present

## 2020-03-27 LAB — COMPREHENSIVE METABOLIC PANEL
ALT: 18 U/L (ref 0–44)
AST: 18 U/L (ref 15–41)
Albumin: 2.7 g/dL — ABNORMAL LOW (ref 3.5–5.0)
Alkaline Phosphatase: 77 U/L (ref 38–126)
Anion gap: 13 (ref 5–15)
BUN: 92 mg/dL — ABNORMAL HIGH (ref 8–23)
CO2: 23 mmol/L (ref 22–32)
Calcium: 8.9 mg/dL (ref 8.9–10.3)
Chloride: 105 mmol/L (ref 98–111)
Creatinine, Ser: 1.83 mg/dL — ABNORMAL HIGH (ref 0.44–1.00)
GFR, Estimated: 27 mL/min — ABNORMAL LOW (ref >60)
Glucose, Bld: 216 mg/dL — ABNORMAL HIGH (ref 70–99)
Potassium: 4.8 mmol/L (ref 3.5–5.1)
Sodium: 141 mmol/L (ref 135–145)
Total Bilirubin: 0.5 mg/dL (ref 0.3–1.2)
Total Protein: 6.4 g/dL — ABNORMAL LOW (ref 6.5–8.1)

## 2020-03-27 LAB — GLUCOSE, CAPILLARY
Glucose-Capillary: 168 mg/dL — ABNORMAL HIGH (ref 70–99)
Glucose-Capillary: 171 mg/dL — ABNORMAL HIGH (ref 70–99)
Glucose-Capillary: 158 mg/dL — ABNORMAL HIGH (ref 70–99)
Glucose-Capillary: 180 mg/dL — ABNORMAL HIGH (ref 70–99)
Glucose-Capillary: 136 mg/dL — ABNORMAL HIGH (ref 70–99)

## 2020-03-27 LAB — AMMONIA: Ammonia: 36 umol/L — ABNORMAL HIGH (ref 9–35)

## 2020-03-27 MED ORDER — VALPROIC ACID 250 MG/5ML PO SOLN
500.0000 mg | Freq: Three times a day (TID) | ORAL | Status: DC
  Administered 2020-03-27 (×3): 500 mg
  Filled 2020-03-27 (×3): qty 10

## 2020-03-27 MED ORDER — LACTATED RINGERS IV BOLUS
1000.0000 mL | Freq: Once | INTRAVENOUS | Status: AC
  Administered 2020-03-27: 1000 mL via INTRAVENOUS

## 2020-03-27 NOTE — Progress Notes (Signed)
SLP Cancellation Note  Patient Details Name: Kimberly Howell MRN: [id number] DOB: [birth year]   Cancelled treatment:        Spoke with RT who reported multiple issues with pt's trach presently (positioning) and tracheal irritation. She has not tolerated trach collar since last week since initial PMV evaluation- could do inline PMV if she would be appropriate to participate   [place] Siren 03/27/2020, 9:47 AM   Orbie Pyo Colvin Caroli.Ed Risk analyst [phone] Office [phone]

## 2020-03-27 NOTE — TOC Progression Note (Addendum)
Transition of Care [facility]) - Progression Note    Patient Details  Name: Kimberly Howell MRN: [id number] Date of Birth: April 06, 1946  Transition of Care [facility]) CM/SW [place], [place] Phone Number: 03/27/2020, 3:14 PM  Clinical Narrative:     Update 4:38pm-CSW spoke with patients son [name] and wants CSW to continue to try for SNF placement for patient but says plans may change. Patients family met with palliative today. Patients son said he is going to have a family meeting to discuss future plans for patient. Patients son told CSW he will follow back up with her on what they decided for patient.  CSW will continue to follow.  Update 4:31pm-Kasey with [facility] called CSW and requested updated clinicals for patient. CSW faxed over clinicals.  CSW will continue to follow.  CSW spoke with [facility] with [facility] and gave pending medicaid and caseworker information requested for possible SNF placement. [facility] said once we have the CRE rectal swab results they can give answer if they can accept patient for SNF placement. This patient has regular [employer]. Facility will start insurance authorization once accepted for SNF placement. CSW will update patients family.  CSW will continue to follow.  Expected Discharge Plan: Skilled Nursing Facility Barriers to Discharge: Continued Medical Work up  Expected Discharge Plan and Services Expected Discharge Plan: [place]   Discharge Planning Services: CM Consult Post Acute Care Choice: Long Term Acute Care (LTAC) Living arrangements for the past 2 months: Single Family Home                                       Social Determinants of Health (SDOH) Interventions    Readmission Risk Interventions Readmission Risk Prevention Plan 03/14/2020  Transportation Screening Complete  PCP or Specialist Appt within 5-7 Days Complete  Home Care Screening Complete  Medication Review (RN CM) Complete

## 2020-03-27 NOTE — Progress Notes (Signed)
Patient ID: Kimberly Howell, female   DOB: [birth year], 74 y.o.   MRN: [id number]  This NP reviewed medical records, discussed case with team and then visited patient at the bedside as a follow for palliative medicine needs and emotional support.  Patient is s/p prolonged PEA arrest, today is day 24 of this hospitalization. She remains vent dependant, unfortunately neurological she does not attend, track or follow commands.   Met with family (both sons and two sisters)  as planned for continued conversation regarding diagnosis, prognosis, treatment options, goals of care, end-of-life wishes, disposition and options.  Marni Griffon nurse practitioner with CCM updated family on current medical conditions, answered questions and concerns and offered support to this family  in this difficult situation.  Dorian Pod Thompson/ Chaplain with Spirit[facility] present.  I attempted then to create space and opportunity for family to explore thoughts and feelings regarding the patient's current medical situation.  All family members verbalize sadness around current situation.  Family are able to verbalize a sense of knowing that the patient is not progressing as they had hoped and also the patient's value as it relates to her quality of life.  Son verbalizes " I know she would not want to live on a vent"  Education offered on regarding the patient high risk for decompensation 2/2 to her multiple co-morbidites.    Plan of Care -DNR/DNI -Continue current medical interventions as family remain hopeful for improvement - Possible wean from vent with "special" inner cannula as detailed by Marni Griffon NP/CCM within the next few days -Depending on outcomes in the days to come family understand they are faced with the difficult "what if's" decisions specific to life prolonging interventions   Discussed with family the importance of continued conversation with each other and the  medical providers regarding overall plan of  care and treatment options,  ensuring decisions are within the context of the patients values and GOCs.         Will continue to coordinate family meetings to enhance patient centered care.    Family encouraged to call with questions or concerns  Total time spent on the unit was 41  Minutes    Discussed with bedside RN  Greater than 50% of the time was spent in counseling and coordination of care  Wadie Lessen NP  Palliative Medicine Team Team Phone # [phone] Pager [phone]

## 2020-03-27 NOTE — Progress Notes (Signed)
NAME:  Kimberly Howell, MRN:  [id number], DOB:  [birth year], LOS: 24 ADMISSION DATE:  03/03/2020, CONSULTATION DATE:  03/27/20 REFERRING MD:  EDP, CHIEF COMPLAINT:  Cardiac arrest   Brief History   74 year old female with PMH chronic respiratory failure with COPD on 3 L home O2 and CPAP nightly, stage IV CKD, hypertension, morbid obesity who had a cardiac arrest with >45 minutes CPR before ROSC then lost pulses three more times.  Intubated and PCCM consulted for admission  Past Medical History  Morbid obesity with BMI of 50.0-59.9, adult ([place]) Chronic respiratory failure (HCC) CKD (chronic kidney disease) stage 3, GFR 30-59 ml/min (HCC) Essential hypertension OSA on CPAP  Significant Hospital Events   9/18 Admit to PCCM 9/28 Trach  10/4 Started Depakote   Consults:  Ophthamology ENT Neurology Palliative  Procedures:  9/18 ETT >> 9/28 9/21 CVC 3L >> to be taken out 9/29 post PEG 9/27 LP  9/28 Tracheostomy  9/29 IR PEG  10/6 Trach collar  Significant Diagnostic Tests:  CT head/C-spine/Maxillofacial >> Small left frontal scalp hematoma without skull fracture. Fracture of the left lamina papyracea with herniation of the left medial rectus muscle into the ethmoid sinus. Small amount of retro bulbar stranding, likely hemorrhage.  Right lung apex consolidation concerning for pneumonia. TTE 9/18 >> LVEF 60-65%; no regional wall abnormalities; G1DD, normal RV EEG 9/20 >> diffuse encephalopathy MRI brain 9/24 >> Findings are highly suspicious for hypoxic/ischemic injury. Two punctate acute infarcts within the paramedian right frontal lobe are difficult to exclude. Medially displaced acute fracture of the left lamina papyracea. There is also an acute, displaced fracture of the left fovea ethmoidalis. Orbital fat and the medial rectus muscle extend into the left ethmoid sinus. Additionally, orbital fat extends from the left ethmoid sinus through the fracture defect in the fovea ethmoidalis  intracranially into the left anterior cranial fossa. The intracranial component of orbital fat measures 1 cm and exerts mild local mass effect upon the anteroinferior left frontal lobe.   Micro Data:  9/18 Sars-CoV-2>>negative 9/18 MRSA >> positive 9/27 CSF culture >> negative 9/27 UC >> negative 9/27 BC x 2 >> negative 9/28 BAL >> negative 10/2 Tracheal aspirate >>  10/2 UA >> negative   Antimicrobials:  Unasyn 9/19 >> 9/25 Vancomycin >> 9/27 (1 dose) Ceftriaxone 9/27 >> 9/29 Cefepime 10/2 >>  Vanco 10/2 >>10  Interim history/subjective:  No events, still poorly responsive.  Objective   Blood pressure (!) 122/53, pulse (!) 109, temperature 99.1 F (37.3 C), temperature source Oral, resp. rate (!) 29, height _0  (1.727 m), weight 127.8 kg, SpO2 99 %.    Vent Mode: PRVC FiO2 (%):  [40 %] 40 % Set Rate:  [22 bmp] 22 bmp Vt Set:  [380 mL] 380 mL PEEP:  [5 cmH20-8 cmH20] 8 cmH20 Pressure Support:  [12 cmH20-14 cmH20] 14 cmH20 Plateau Pressure:  [16 cmH20-17 cmH20] 17 cmH20   Intake/Output Summary (Last 24 hours) at 03/27/2020 0716 Last data filed at 03/27/2020 0400 Gross per 24 hour  Intake 680.68 ml  Output 1142 ml  Net -461.32 ml   Filed Weights   03/25/20 0500 03/26/20 0500 03/27/20 0500  Weight: 126.7 kg 125.1 kg 127.8 kg   EXAM Constitutional: chronically ill obese woman in no acute distress Eyes: eyes are anicteric, reactive to light Ears, nose, mouth, and throat: trach in place, thick bloody secretions Cardiovascular: heart sounds are regular, ext are warm to touch. no edema Respiratory: Scattered rhonci, triggering vent Gastrointestinal: abdomen  is soft with + BS Skin: No rashes, normal turgor Neurologic: she winces to pain, cannot really get a motor response, occasional nonpurposeful eye opening Psychiatric: cannot assess  BUN up a bit Cr creeping up as well Afebrile Net -0.4L 1100 cc UoP  Resolved Problem List:  AKI on CKD- now close to euvolemic  and new baseline Cr Hypernatremia  Cardiac arrest with hypoxic and hypercarbic respiratory failure OHS Patient with prolonged downtime  > 45 minutes. 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 - Facial twitching resolved, biggest issue is persistent depressed mental status - Start to wean VPA, monitor for s/s of myoclonus - Continue trial of modafenil - Encourage day night cycles, lights on day/ off at night - Check ammonia  Acute on chronic respiratory failure with hypoxia- now s/p trach R sided Pneumonia Hemoptysis 10/11, question related to suction trauma  - repeat CXR - f/u sputum culture - PS trials as able - PMV if wakes up more - Marni Griffon to take a look with bronchoscope today  CKD- BUN creeping up, giving a bolus of LR today, very poor HD candidate  Periorbital Fracture with Hyphema, stable Herniation of the left medial rectus muscle into the sinus with small amount of retro bulbar stranding.  Ophthalmologist noted patient's EOM intact and no limitations of gaze & no signs of entrapment seen.   ENT recommends to not repair orbital injury at this time. Neurosurgery consulted 9/28, stating no indication for repair unless clear CSF leak which the patient has no symptoms of.  - F/u with outpt optho  Hyperglycemia  - Levemir and SSI, goal 140-180  Constipation - PRN BM  At Risk Malnutrition  PEG Status  PEG (9/29) - TF  Best practice:  Diet: EN  Pain/Anxiety/Delirium protocol (if indicated): hold VAP protocol (if indicated): yes  DVT prophylaxis: Heparin SQ/ SCDs GI prophylaxis: Protonix Glucose control: SSI & levemir BID  Mobility:  PT Code Status: DNR Family Communication: family at bedside 10/11 Disposition: ICU   The patient is critically ill with multiple organ systems failure and requires high complexity decision making for assessment and support, frequent evaluation and titration of therapies, application of advanced monitoring technologies and extensive interpretation of multiple databases. Critical Care Time devoted to patient care services described in this note independent of APP/resident time (if applicable)  is 32 minutes.   Erskine Emery MD [place] [facility] 03/27/2020 7:25 AM Personal pager: [phone] If unanswered, please page CCM On-call: [phone]

## 2020-03-27 NOTE — Procedures (Signed)
Bronchoscopy Procedure Note  Kimberly Howell  [id number]  12-09-1945  Date:03/27/20  Time:11:14 AM   Provider Performing:Pete E Kary Kos   Procedure(s):  Flexible Bronchoscopy [phone])  Indication(s) To evaluate acute obstruction   Consent Unable to obtain consent due to emergent nature of procedure.  Anesthesia Not indicated    Time Out Verified patient identification, verified procedure, site/side was marked, verified correct patient position, special equipment/implants available, medications/allergies/relevant history reviewed, required imaging and test results available.   Sterile Technique Usual hand hygiene, masks, gowns, and gloves were used   Procedure Description Bronchoscope advanced through tracheostomy tube and into airway.  Airways were examined down to the level of [place]  Following diagnostic evaluation: almost complete obstruction just distal to trachea on inspiratory efforts.   Findings: soft tissue of the posterior wall of trachea occluding distal tip of trach      A little less obstruction distal    Complications/Tolerance None; patient tolerated the procedure well.   EBL Minimal   Erick Colace ACNP-BC [place] Pager # [phone] OR # [phone] if no answer

## 2020-03-27 NOTE — Progress Notes (Signed)
RT note-Asked to have patient bronched for visualization of possible obstruction due to audible upper airway wheezing and labored breathing while on trach collar. Marni Griffon, NP performed  And will consult with Dr. Tamala Julian.

## 2020-03-27 NOTE — Progress Notes (Addendum)
Physical Therapy Treatment Patient Details Name: Kimberly Howell MRN: [id number] DOB: 1946/06/04 Today's Date: 03/27/2020    History of Present Illness 74 yo admitted with cardiac arrest and fall on 9/18 s/p CPR with resultant left orbit fx. Intubated 9/18, trach 9/ 28, PEG 9/29. PMhx: morbid obesity, HTN, respiratory failure on 3L, CKD stage 4    PT Comments    Pt with no significant interaction today. Pt with maintained right upper gaze, no purposeful movement and only grimace with bil shoulder and right knee movement. Right knee with increased rigidity from prior sessions, decreased attention and participation from last time seen by this therapist (10/1). Will follow for continued attempts at mobility and progression for another week.   HR 117 SpO2 98% on 40% Fio2 PRVC   Follow Up Recommendations  SNF;Supervision/Assistance - 24 hour     Equipment Recommendations  Hospital bed;Other (comment);Wheelchair (measurements PT);Wheelchair cushion (measurements PT)    Recommendations for Other Services       Precautions / Restrictions Precautions Precautions: Fall Precaution Comments: trach, PEG, vent    Mobility  Bed Mobility Overal bed mobility: Needs Assistance Bed Mobility: Rolling Rolling: +2 for physical assistance;Total assist         General bed mobility comments: +2 total assist to roll bil, slide toward HOB and bed functions to place in chair position  Transfers                 General transfer comment: not attempted  Ambulation/Gait                 Stairs             Wheelchair Mobility    Modified Rankin (Stroke Patients Only)       Balance                                            Cognition Arousal/Alertness: Lethargic Behavior During Therapy: Flat affect Overall Cognitive Status: Impaired/Different from baseline                                 General Comments: pt unresponsive other  than to pain, no blink to threat with maintained right upper gaze fixation      Exercises General Exercises - Upper Extremity Shoulder Flexion: PROM;Both;10 reps;Supine Shoulder ADduction: PROM;Both;Supine;10 reps Shoulder Horizontal ABduction: PROM;Both;10 reps;Supine Elbow Flexion: PROM;Both;10 reps;Supine General Exercises - Lower Extremity Heel Slides: PROM;Both;Supine;10 reps Hip ABduction/ADduction: Both;PROM;10 reps;Supine    General Comments        Pertinent Vitals/Pain Pain Assessment: Faces Faces Pain Scale: Hurts little more Pain Location: grimace with bil shoulder movement and sternal rub Pain Descriptors / Indicators: Grimacing Pain Intervention(s): Limited activity within patient's tolerance;Repositioned    Home Living                      Prior Function            PT Goals (current goals can now be found in the care plan section) Acute Rehab PT Goals PT Goal Formulation: Patient unable to participate in goal setting Time For Goal Achievement: 04/03/20 Potential to Achieve Goals: Poor Progress towards PT goals: Not progressing toward goals - comment    Frequency    Min 2X/week      PT Plan Current  plan remains appropriate    Co-evaluation              AM-PAC PT "6 Clicks" Mobility   Outcome Measure  Help needed turning from your back to your side while in a flat bed without using bedrails?: Total Help needed moving from lying on your back to sitting on the side of a flat bed without using bedrails?: Total Help needed moving to and from a bed to a chair (including a wheelchair)?: Total Help needed standing up from a chair using your arms (e.g., wheelchair or bedside chair)?: Total Help needed to walk in hospital room?: Total Help needed climbing 3-5 steps with a railing? : Total 6 Click Score: 6    End of Session   Activity Tolerance: Patient limited by lethargy Patient left: in bed;with call bell/phone within reach Nurse  Communication: Mobility status;Need for lift equipment PT Visit Diagnosis: Other abnormalities of gait and mobility (R26.89);Muscle weakness (generalized) (M62.81);Other symptoms and signs involving the nervous system (R29.898)     Time: 1537-9432 PT Time Calculation (min) (ACUTE ONLY): 14 min  Charges:  $Therapeutic Exercise: 8-22 mins                     Shaivi Rothschild P, PT Acute Rehabilitation Services Pager: [phone] Office: [place] 03/27/2020, 1:14 PM

## 2020-03-27 NOTE — Progress Notes (Signed)
Long discussion w/ pt's sons Lovenia Kim (via speaker phone), and two sisters [name] and another... We discussed current obstacles to progress. This was with NP Larach in addition to the hospital chaplin.  -on-going encephalopathy and concern for anoxic injury w/ yet to be determined; but likely poor functional recovery -on going ventilator dependence -deconditioning   In addition to the above we also discussed possible disposition of post-hospital care, specifically home vs LTAC then home vs Vent SNF.   Initially [name] (her son) indicated that she would want all things done (but when pressed further about this [name] did verbalize (as did Scientist, product/process development) that Mrs Yinger would not want to live her life on mechanical ventilation. I challenged the family to discuss at this point the "what ifs" to discuss what if she doesn't get better, what if she needs to go to a Vent SNF vs is there family available to dedicate time to care for her. NP Larach challenged them further to discuss this in the context of Mrs Suchy's values. From a pulmonary stand-point given what we see as fairly significant bronchomalacia I am concerned she may not be able to sustain an airway with out positive pressure. We have special ordered a new trach which is adjustable length. If this is able to bypass the area where she obstructs the most we may still have chance to get her liberated from vent but if not chances of vent liberation are very slim and this is even independent of her brain injury.   Erick Colace ACNP-BC [place] Pager # [phone] OR # [phone] if no answer

## 2020-03-27 NOTE — Progress Notes (Signed)
This chaplain joined the Pt. family and medical team for a F/U family meeting. The chaplain was present and will continue to be available for F/U spiritual care as needed.   The chaplain understands the family will take time to talk among themselves holding the purpose of determining with the medical team the Pt. goals of care. The chaplain understands the family will gather again with the medical team on Thursday.

## 2020-03-27 NOTE — Progress Notes (Signed)
Nutrition Follow-up  DOCUMENTATION CODES:   Morbid obesity  INTERVENTION:   Continue tube feeds via PEG: - Vital AF 1.2 @ 60 ml/hr (1440 ml/day) - ProSource TF 45 ml daily - Free water per CCM, currently 200 ml q 4 hours  Tube feeding regimen provides1768kcal, 119grams of protein, and 1154m of H2O.  Total free water: 2368 ml  NUTRITION DIAGNOSIS:   Inadequate oral intake related to acute illness as evidenced by NPO status.  Ongoing  GOAL:   Patient will meet greater than or equal to 90% of their needs  Met via TF  MONITOR:   Vent status, TF tolerance, Labs, Weight trends  REASON FOR ASSESSMENT:   Consult Enteral/tube feeding initiation and management  ASSESSMENT:   74yo female admitted post cardiac arrest requiring 45-50 minutes of CPR before ROSC achieved followed by loss of pulse 3 more times; pt intubated and sedated; also with periorbital fracture, AKI.  PMH includes COPD on home oxygen, stage IV CKD, HTN, morbid obesity  9/28 - trach 9/29 - PEG  Discussed pt with RN and during ICU rounds. Pt tolerating TF without issue. Noted plan for family meeting today at 12:00 with palliative care.  Last week pt able to tolerate trach collar for about 8 hours during the day. However, pt transitioned to trach collar this morning and had to be put back on vent support shortly after due to poor tolerance.  Admit weight: 140.8 kg Current weight: 127.8 kg  Pt with non-pitting edema to BUE and BLE.  Patient is on ventilator support MV: 11.6 L/min Temp (24hrs), Avg:99.2 F (37.3 C), Min:98.6 F (37 C), Max:99.9 F (37.7 C)  Medications reviewed and include: lasix, SSI q 4 hours, Novolog 6 units q 4 hours, levemir 18 units daily, protonix  Labs reviewed. CBG's: 147-254 x 24 hours  UOP: 1412 ml x 24 hours  Diet Order:   Diet Order    None      EDUCATION NEEDS:   Not appropriate for education at this time  Skin:  Skin Assessment: Reviewed RN  Assessment (MASD to pelvis)  Last BM:  03/27/20 rectal tube  Height:   Ht Readings from Last 1 Encounters:  03/25/20 '5\' 8"'  (1.727 m)    Weight:   Wt Readings from Last 1 Encounters:  03/27/20 127.8 kg    Ideal Body Weight:  54.5 kg  BMI:  Body mass index is 42.84 kg/m.  Estimated Nutritional Needs:   Kcal:  1700-1900  Protein:  110-135 grams  Fluid:  >/= 1.5 L    KGaynell Face MS, RD, LDN Inpatient Clinical Dietitian Please see AMiON for contact information.

## 2020-03-28 DIAGNOSIS — Z515 Encounter for palliative care: Secondary | ICD-10-CM | POA: Diagnosis not present

## 2020-03-28 DIAGNOSIS — Z9911 Dependence on respirator [ventilator] status: Secondary | ICD-10-CM | POA: Diagnosis not present

## 2020-03-28 DIAGNOSIS — J9601 Acute respiratory failure with hypoxia: Secondary | ICD-10-CM | POA: Diagnosis not present

## 2020-03-28 DIAGNOSIS — I469 Cardiac arrest, cause unspecified: Secondary | ICD-10-CM | POA: Diagnosis not present

## 2020-03-28 DIAGNOSIS — J9621 Acute and chronic respiratory failure with hypoxia: Secondary | ICD-10-CM | POA: Diagnosis not present

## 2020-03-28 LAB — BASIC METABOLIC PANEL
Anion gap: 12 (ref 5–15)
BUN: 92 mg/dL — ABNORMAL HIGH (ref 8–23)
CO2: 23 mmol/L (ref 22–32)
Calcium: 9 mg/dL (ref 8.9–10.3)
Chloride: 102 mmol/L (ref 98–111)
Creatinine, Ser: 1.7 mg/dL — ABNORMAL HIGH (ref 0.44–1.00)
GFR, Estimated: 30 mL/min — ABNORMAL LOW (ref >60)
Glucose, Bld: 142 mg/dL — ABNORMAL HIGH (ref 70–99)
Potassium: 4.9 mmol/L (ref 3.5–5.1)
Sodium: 137 mmol/L (ref 135–145)

## 2020-03-28 LAB — CULTURE, RESPIRATORY W GRAM STAIN

## 2020-03-28 LAB — GLUCOSE, CAPILLARY
Glucose-Capillary: 121 mg/dL — ABNORMAL HIGH (ref 70–99)
Glucose-Capillary: 126 mg/dL — ABNORMAL HIGH (ref 70–99)
Glucose-Capillary: 140 mg/dL — ABNORMAL HIGH (ref 70–99)
Glucose-Capillary: 167 mg/dL — ABNORMAL HIGH (ref 70–99)
Glucose-Capillary: 171 mg/dL — ABNORMAL HIGH (ref 70–99)
Glucose-Capillary: 173 mg/dL — ABNORMAL HIGH (ref 70–99)
Glucose-Capillary: 179 mg/dL — ABNORMAL HIGH (ref 70–99)

## 2020-03-28 LAB — MISC LABCORP TEST (SEND OUT): Labcorp test code: 183865

## 2020-03-28 MED ORDER — IPRATROPIUM-ALBUTEROL 0.5-2.5 (3) MG/3ML IN SOLN
3.0000 mL | Freq: Four times a day (QID) | RESPIRATORY_TRACT | Status: DC
  Administered 2020-03-28 – 2020-03-29 (×5): 3 mL via RESPIRATORY_TRACT
  Filled 2020-03-28 (×5): qty 3

## 2020-03-28 MED ORDER — CHLORHEXIDINE GLUCONATE 0.12 % MT SOLN
15.0000 mL | Freq: Two times a day (BID) | OROMUCOSAL | Status: DC
  Administered 2020-03-28 – 2020-04-11 (×29): 15 mL via OROMUCOSAL
  Filled 2020-03-28 (×9): qty 15

## 2020-03-28 MED ORDER — VALPROIC ACID 250 MG/5ML PO SOLN
500.0000 mg | Freq: Two times a day (BID) | ORAL | Status: AC
  Administered 2020-03-28 – 2020-03-29 (×3): 500 mg
  Filled 2020-03-28 (×3): qty 10

## 2020-03-28 MED ORDER — FREE WATER
100.0000 mL | Status: DC
  Administered 2020-03-28 – 2020-04-11 (×84): 100 mL

## 2020-03-28 NOTE — Progress Notes (Signed)
This chaplain was present bedside with the Pt. sister-Katherine.  The chaplain listened as Belenda Cruise told the chaplain more about the Pt. and her family.  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 The Pt. gift of singing was often shared on family Zooms.   Belenda Cruise is leaning on community prayer and is accepting prayer with the chaplain as the family experiences health care decision making for the Pt. F/U spiritual care continues to be available as needed.

## 2020-03-28 NOTE — Progress Notes (Signed)
NAME:  Kimberly Howell, MRN:  [id number], DOB:  [birth year], LOS: 96 ADMISSION DATE:  03/03/2020, CONSULTATION DATE:  03/28/20 REFERRING MD:  EDP, CHIEF COMPLAINT:  Cardiac arrest   Brief History   74 year old female with PMH chronic respiratory failure with COPD on 3 L home O2 and CPAP nightly, stage IV CKD, hypertension, morbid obesity who had a cardiac arrest with >45 minutes CPR before ROSC then lost pulses three more times.  Intubated and PCCM consulted for admission  Past Medical History  Morbid obesity with BMI of 50.0-59.9, adult ([place]) Chronic respiratory failure (HCC) CKD (chronic kidney disease) stage 3, GFR 30-59 ml/min (HCC) Essential hypertension OSA on CPAP  Significant Hospital Events   9/18 Admit to PCCM 9/28 Trach  10/4 Started Depakote   Consults:  Ophthamology ENT Neurology Palliative  Procedures:  9/18 ETT >> 9/28 9/21 CVC 3L >> to be taken out 9/29 post PEG 9/27 LP  9/28 Tracheostomy  9/29 IR PEG  10/6 Trach collar  Significant Diagnostic Tests:  CT head/C-spine/Maxillofacial >> Small left frontal scalp hematoma without skull fracture. Fracture of the left lamina papyracea with herniation of the left medial rectus muscle into the ethmoid sinus. Small amount of retro bulbar stranding, likely hemorrhage.  Right lung apex consolidation concerning for pneumonia. TTE 9/18 >> LVEF 60-65%; no regional wall abnormalities; G1DD, normal RV EEG 9/20 >> diffuse encephalopathy MRI brain 9/24 >> Findings are highly suspicious for hypoxic/ischemic injury. Two punctate acute infarcts within the paramedian right frontal lobe are difficult to exclude. Medially displaced acute fracture of the left lamina papyracea. There is also an acute, displaced fracture of the left fovea ethmoidalis. Orbital fat and the medial rectus muscle extend into the left ethmoid sinus. Additionally, orbital fat extends from the left ethmoid sinus through the fracture defect in the fovea ethmoidalis  intracranially into the left anterior cranial fossa. The intracranial component of orbital fat measures 1 cm and exerts mild local mass effect upon the anteroinferior left frontal lobe.   Micro Data:  9/18 Sars-CoV-2>>negative 9/18 MRSA >> positive 9/27 CSF culture >> negative 9/27 UC >> negative 9/27 BC x 2 >> negative 9/28 BAL >> negative 10/2 Tracheal aspirate >>  10/2 UA >> negative   Antimicrobials:  Unasyn 9/19 >> 9/25 Vancomycin >> 9/27 (1 dose) Ceftriaxone 9/27 >> 9/29 Cefepime 10/2 >>  Vanco 10/2 >>10  Interim history/subjective:  No events, still poorly responsive unfortunately.  Objective   Blood pressure 134/69, pulse (!) 110, temperature 99 F (37.2 C), temperature source Oral, resp. rate (!) 27, height _0  (1.727 m), weight 128 kg, SpO2 100 %.    Vent Mode: PSV;CPAP FiO2 (%):  [40 %] 40 % Set Rate:  [22 bmp] 22 bmp Vt Set:  [380 mL] 380 mL PEEP:  [8 cmH20] 8 cmH20 Pressure Support:  [14 cmH20] 14 cmH20 Plateau Pressure:  [20 cmH20-23 cmH20] 20 cmH20   Intake/Output Summary (Last 24 hours) at 03/28/2020 0857 Last data filed at 03/28/2020 0700 Gross per 24 hour  Intake 660 ml  Output 2300 ml  Net -1640 ml   Filed Weights   03/26/20 0500 03/27/20 0500 03/28/20 0456  Weight: 125.1 kg 127.8 kg 128 kg   EXAM Constitutional: chronically ill obese woman in no acute distress Eyes: eyes are anicteric, reactive to light Ears, nose, mouth, and throat: trach in place, thick bloody secretions Cardiovascular: heart sounds are regular, ext are warm to touch. no edema Respiratory: Scattered rhonci, triggering vent Gastrointestinal: abdomen is soft  with + BS Skin: No rashes, normal turgor Neurologic: eyes are open today, she tracks but only sometimes, winces to any manipulation of extremities Psychiatric: cannot assess  BMP pending Net -1.2 L 2300 cc UoP  Resolved Problem List:  AKI on CKD- now close to euvolemic and new baseline Cr Hypernatremia  Cardiac  arrest with hypoxic and hypercarbic respiratory failure OHS Patient with prolonged downtime  > 45 minutes. 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  Clinically she improved remarkably after rewarming, following commands, interacting but then has slipped back into a persistent vegetative state. - Facial twitching resolved, biggest issue is persistent depressed mental status - Continue current VPA x 1 more day then taper further - Continue trial of modafenil - Encourage day night cycles, lights on day/ off at night  Acute on chronic respiratory failure with hypoxia- now s/p trach R sided Pneumonia Tracheobroncomalacia OHS/OSA - trach possibly in suboptimal position, getting bivona adjustable to see if any possibility she can be weaned from vent - with TC trials presently she occludes airway against posterior wall due to lack of rigidity in structure of trachea  CKD- f/u BMP today  Periorbital Fracture with Hyphema, stable Herniation of the left medial rectus muscle into the sinus with small amount of retro bulbar stranding.  Ophthalmologist noted patient's EOM intact and no limitations of gaze & no signs of entrapment seen.   ENT recommends to not repair orbital injury at this time. Neurosurgery consulted 9/28, stating no indication for repair unless clear CSF leak which the patient has no symptoms of.  - F/u with outpt optho   Hyperglycemia  - Levemir and SSI, goal 140-180  Constipation - PRN BM  At Risk Malnutrition  PEG Status  PEG (9/29) - TF  Disposition- family is now unsure they'd want  to make Sandy live on a ventilator.  Degree of baseline lung issues may preclude weaning.  Ongoing discussions with final plans after trial of new adjustable length tracheostomy tube.  Best practice:  Diet: EN  Pain/Anxiety/Delirium protocol (if indicated): hold VAP protocol (if indicated): yes  DVT prophylaxis: Heparin SQ/ SCDs GI prophylaxis: Protonix Glucose control: SSI & levemir BID  Mobility: PT Code Status: DNR Family Communication: updated at length by Marni Griffon on 10/12 Disposition: ICU   The patient is critically ill with multiple organ systems failure and requires high complexity decision making for assessment and support, frequent evaluation and titration of therapies, application of advanced monitoring technologies and extensive interpretation of multiple databases. Critical Care Time devoted to patient care services described in this note independent of APP/resident time (if applicable)  is 34 minutes.   Erskine Emery MD [facility] 03/28/2020 8:57 AM Personal pager: [phone] If unanswered, please page CCM On-call: [phone]

## 2020-03-28 NOTE — Progress Notes (Signed)
Occupational Therapy Treatment and Discharge Note Patient Details Name: Kimberly Howell MRN: [id number] DOB: 10-Feb-1946 Today's Date: 03/28/2020    History of present illness 74 yo admitted with cardiac arrest and fall on 9/18 s/p CPR with resultant left orbit fx. Intubated 9/18, trach 9/ 28, PEG 9/29. PMhx: morbid obesity, HTN, respiratory failure on 3L, CKD stage 4   OT comments  Performed B UE/LE PROM within pt's pain tolerance. Grimaces with B shoulder and R knee ROM and blinks to threat, otherwise unresponsive. Pt's sister, [name], came in near end of session, but pt did not respond to her either.  Pt, unfortunately, has not made progress in a reasonable amount of time. OT is signing off.   Follow Up Recommendations  SNF;Supervision/Assistance - 24 hour;LTACH    Equipment Recommendations       Recommendations for Other Services      Precautions / Restrictions Precautions Precautions: Fall Precaution Comments: trach, PEG, vent       Mobility Bed Mobility                  Transfers                      Balance                                           ADL either performed or assessed with clinical judgement   ADL                                         General ADL Comments: dependent in all aspects     Vision       Perception     Praxis      Cognition Arousal/Alertness: Lethargic (eyes open) Behavior During Therapy: Flat affect                                   General Comments: blinked to threat and grimaced to pain with ROM, otherwise unresponsive        Exercises Exercises: General Upper Extremity;General Lower Extremity General Exercises - Upper Extremity Shoulder Flexion: PROM;Both;10 reps;Supine Shoulder ADduction: PROM;Both;Supine;10 reps Shoulder Horizontal ABduction: PROM;Both;10 reps;Supine Elbow Flexion: PROM;Both;10 reps;Supine Elbow Extension: AAROM;Both;Supine;PROM;10  reps Wrist Flexion: PROM;Both;5 reps;Supine Wrist Extension: PROM;Both;5 reps;Supine Digit Composite Flexion: PROM;Both;5 reps;Supine Composite Extension: PROM;Both;5 reps;Supine General Exercises - Lower Extremity Heel Slides: PROM;Both;Supine;10 reps Hip ABduction/ADduction: Both;PROM;10 reps;Supine   Shoulder Instructions       General Comments      Pertinent Vitals/ Pain       Pain Assessment: Faces Faces Pain Scale: Hurts little more Pain Location: B shoulders and R knee with ROM Pain Descriptors / Indicators: Grimacing Pain Intervention(s): Repositioned;Limited activity within patient's tolerance  Home Living                                          Prior Functioning/Environment              Frequency           Progress Toward Goals  OT Goals(current goals can now be found in  the care plan section)  Progress towards OT goals: Not progressing toward goals - comment  Acute Rehab OT Goals OT Goal Formulation: Patient unable to participate in goal setting  Plan Discharge plan remains appropriate;Other (comment) (pt without progress, discharging)    Co-evaluation                 AM-PAC OT "6 Clicks" Daily Activity     Outcome Measure   Help from another person eating meals?: Total Help from another person taking care of personal grooming?: Total Help from another person toileting, which includes using toliet, bedpan, or urinal?: Total Help from another person bathing (including washing, rinsing, drying)?: Total Help from another person to put on and taking off regular upper body clothing?: Total Help from another person to put on and taking off regular lower body clothing?: Total 6 Click Score: 6    End of Session    OT Visit Diagnosis: Muscle weakness (generalized) (M62.81);Pain;History of falling (Z91.81);Other symptoms and signs involving cognitive function   Activity Tolerance Patient limited by lethargy   Patient Left  in bed;with family/visitor present;with call bell/phone within reach   Nurse Communication          Time: 1423-1450 OT Time Calculation (min): 27 min  Charges: OT General Charges $OT Visit: 1 Visit OT Treatments $Therapeutic Exercise: 23-37 mins  Nestor Lewandowsky, OTR/L Acute Rehabilitation Services Pager: [phone] Office: [phone]   [name] 03/28/2020, 3:02 PM

## 2020-03-28 NOTE — Progress Notes (Signed)
Patient ID: Kimberly Howell, female   DOB: 01/11/1946, 74 y.o.   MRN: [id number]  This NP reviewed medical records, discussed case with team and then visited patient at the bedside as a follow for palliative medicine needs and emotional support.  Patient is s/p prolonged PEA arrest, today is day 24 of this hospitalization. She remains vent dependant, unfortunately neurological she does not attend, track or follow commands.   Spoke with sister/Kimberly Howell at bedside.  Kimberly Howell shares loving stories of her sister/Kimberly Howell.  Emotional support offered. Education offered regarding anoxic brain injurys and uncertain trajectory.  Attempted to call son/Brain but unable to leave message  Plan of Care as discussed yesterday  -DNR/DNI -Continue current medical interventions as family remain hopeful for improvement - Possible wean from vent with "special" inner cannula as detailed by Marni Griffon NP/CCM within the next few days -Depending on outcomes in the days to come family understand they are faced with the difficult "what if's" decisions specific to life prolonging interventions    Total time spent on the unit was 15  minutes    Discussed with bedside RN  Greater than 50% of the time was spent in counseling and coordination of care  Wadie Lessen NP  Palliative Medicine Team Team Phone # [phone] Pager [phone]

## 2020-03-29 DIAGNOSIS — J9601 Acute respiratory failure with hypoxia: Secondary | ICD-10-CM | POA: Diagnosis not present

## 2020-03-29 LAB — CBC
HCT: 29 % — ABNORMAL LOW (ref 36.0–46.0)
Hemoglobin: 9.2 g/dL — ABNORMAL LOW (ref 12.0–15.0)
MCH: 30.5 pg (ref 26.0–34.0)
MCHC: 31.7 g/dL (ref 30.0–36.0)
MCV: 96 fL (ref 80.0–100.0)
Platelets: 142 10*3/uL — ABNORMAL LOW (ref 150–400)
RBC: 3.02 MIL/uL — ABNORMAL LOW (ref 3.87–5.11)
RDW: 15.5 % (ref 11.5–15.5)
WBC: 8.2 10*3/uL (ref 4.0–10.5)
nRBC: 0.5 % — ABNORMAL HIGH (ref 0.0–0.2)

## 2020-03-29 LAB — BASIC METABOLIC PANEL
Anion gap: 14 (ref 5–15)
BUN: 101 mg/dL — ABNORMAL HIGH (ref 8–23)
CO2: 23 mmol/L (ref 22–32)
Calcium: 9.2 mg/dL (ref 8.9–10.3)
Chloride: 102 mmol/L (ref 98–111)
Creatinine, Ser: 1.73 mg/dL — ABNORMAL HIGH (ref 0.44–1.00)
GFR, Estimated: 29 mL/min — ABNORMAL LOW (ref >60)
Glucose, Bld: 177 mg/dL — ABNORMAL HIGH (ref 70–99)
Potassium: 4.8 mmol/L (ref 3.5–5.1)
Sodium: 139 mmol/L (ref 135–145)

## 2020-03-29 LAB — GLUCOSE, CAPILLARY
Glucose-Capillary: 141 mg/dL — ABNORMAL HIGH (ref 70–99)
Glucose-Capillary: 164 mg/dL — ABNORMAL HIGH (ref 70–99)
Glucose-Capillary: 132 mg/dL — ABNORMAL HIGH (ref 70–99)
Glucose-Capillary: 188 mg/dL — ABNORMAL HIGH (ref 70–99)
Glucose-Capillary: 125 mg/dL — ABNORMAL HIGH (ref 70–99)

## 2020-03-29 MED ORDER — IPRATROPIUM-ALBUTEROL 0.5-2.5 (3) MG/3ML IN SOLN
3.0000 mL | Freq: Three times a day (TID) | RESPIRATORY_TRACT | Status: DC
  Administered 2020-03-30 – 2020-04-11 (×37): 3 mL via RESPIRATORY_TRACT
  Filled 2020-03-29 (×37): qty 3

## 2020-03-29 NOTE — TOC Progression Note (Addendum)
Transition of Care [facility]) - Progression Note    Patient Details  Name: Kimberly Howell MRN: [id number] Date of Birth: 1945/10/19  Transition of Care [facility]) CM/SW [place], [place] Phone Number: 03/29/2020, 1:49 PM  Clinical Narrative:     CSW called and spoke with [facility] and let her know that rectal results came in. CSW faxed over results. [facility] confirmed with CSW that they received results. [facility] confirmed with CSW that they started insurance authorization for patient.CSW called and spoke with patients son [name] and [facility]. CSW let patients son [name] know she will call him once insurance has been approved.   Patient has a SNF bed at [facility].Pending insurance authorization.  CSW will continue to follow  Expected Discharge Plan: [place] Barriers to Discharge: Continued Medical Work up  Expected Discharge Plan and Services Expected Discharge Plan: [place]   Discharge Planning Services: CM Consult Post Acute Care Choice: Long Term Acute Care (LTAC) Living arrangements for the past 2 months: Single Family Home                                       Social Determinants of Health (SDOH) Interventions    Readmission Risk Interventions Readmission Risk Prevention Plan 03/14/2020  Transportation Screening Complete  PCP or Specialist Appt within 5-7 Days Complete  Home Care Screening Complete  Medication Review (RN CM) Complete

## 2020-03-29 NOTE — Progress Notes (Signed)
Patient ID: Kimberly Howell, female   DOB: Oct 18, 1945, 74 y.o.   MRN: [id number]  This NP reviewed medical records, discussed case with team and then visited patient at the bedside as a follow for palliative medicine needs and emotional support.  Patient is s/p prolonged PEA arrest, today is day 24 of this hospitalization. She remains vent dependant, unfortunately neurological she does not attend, track or follow commands.   Spoke with  son/Kimberly Howell by telephone.  Continued conversation regarding current medical situation.  Created space and opportunity for him to explore thoughts and feeling regarding his mother's current medical situation.   This is a very difficult time for family, his father is currently in a SNF and Kimberly Howell is his his main support person too.   Emotional support offered.  Plan of Care remains as  discussed previously in the week.  -DNR/DNI -Continue current medical interventions as family remain hopeful for improvement - Possible wean from vent with "special" inner cannula as detailed by Marni Griffon NP/CCM   ( I spoke with Laurey Arrow and at this time that cannula has not been secured) -Depending on outcomes in the days to come family understand they are faced with the difficult "what if's" decisions specific to life prolonging interventions   Kimberly Howell voices his concern that patient not be transitioned from the hospital until his mother has the opportunity to wean from the vent as discussed with CCM   Discussed with Kimberly Howell the importance of continued conversation with each other and the  medical providers regarding overall plan of care and treatment options,  ensuring decisions are within the context of the patients values and GOCs.          Unfortunately regardless of vent dependence the pateint has a likely long term poor prognosis for neurologic meaningful recovery.  Family has a deep sense of understanding this but again remain hopeful for improvement.  PMT will continue to coordinate  family meetings to enhance patient centered care.    Family encouraged to call with questions or concerns  Total time spent on the unit was 25  minutes    Discussed with bedside RN  This nurse practitioner informed  the patient/family and the attending that I will be out of the hospital until Monday morning.  I will discuss with my co-workers for week-end follow-up.  Call palliative medicine team phone # [phone] with questions or concerns.  Greater than 50% of the time was spent in counseling and coordination of care  Wadie Lessen NP  Palliative Medicine Team Team Phone # [phone] Pager [phone]

## 2020-03-29 NOTE — Progress Notes (Signed)
NAME:  Kimberly Howell, MRN:  [id number], DOB:  1946-01-20, LOS: 40 ADMISSION DATE:  03/03/2020, CONSULTATION DATE:  03/29/20 REFERRING MD:  EDP, CHIEF COMPLAINT:  Cardiac arrest   Brief History   74 year old female with PMH chronic respiratory failure with COPD on 3 L home O2 and CPAP nightly, stage IV CKD, hypertension, morbid obesity who had a cardiac arrest with >45 minutes CPR before ROSC then lost pulses three more times.  Intubated and PCCM consulted for admission  Past Medical History  Morbid obesity with BMI of 50.0-59.9, adult ([place]) Chronic respiratory failure (HCC) CKD (chronic kidney disease) stage 3, GFR 30-59 ml/min (HCC) Essential hypertension OSA on CPAP  Significant Hospital Events   9/18 Admit to PCCM 9/28 Trach  10/4 Started Depakote   Consults:  Ophthamology ENT Neurology Palliative  Procedures:  9/18 ETT >> 9/28 9/21 CVC 3L >> to be taken out 9/29 post PEG 9/27 LP  9/28 Tracheostomy  9/29 IR PEG  10/6 Trach collar  Significant Diagnostic Tests:  CT head/C-spine/Maxillofacial >> Small left frontal scalp hematoma without skull fracture. Fracture of the left lamina papyracea with herniation of the left medial rectus muscle into the ethmoid sinus. Small amount of retro bulbar stranding, likely hemorrhage.  Right lung apex consolidation concerning for pneumonia. TTE 9/18 >> LVEF 60-65%; no regional wall abnormalities; G1DD, normal RV EEG 9/20 >> diffuse encephalopathy MRI brain 9/24 >> Findings are highly suspicious for hypoxic/ischemic injury. Two punctate acute infarcts within the paramedian right frontal lobe are difficult to exclude. Medially displaced acute fracture of the left lamina papyracea. There is also an acute, displaced fracture of the left fovea ethmoidalis. Orbital fat and the medial rectus muscle extend into the left ethmoid sinus. Additionally, orbital fat extends from the left ethmoid sinus through the fracture defect in the fovea ethmoidalis  intracranially into the left anterior cranial fossa. The intracranial component of orbital fat measures 1 cm and exerts mild local mass effect upon the anteroinferior left frontal lobe.   Micro Data:  9/18 Sars-CoV-2>>negative 9/18 MRSA >> positive 9/27 CSF culture >> negative 9/27 UC >> negative 9/27 BC x 2 >> negative 9/28 BAL >> negative 10/2 Tracheal aspirate >>  10/2 UA >> negative   Antimicrobials:  Unasyn 9/19 >> 9/25 Vancomycin >> 9/27 (1 dose) Ceftriaxone 9/27 >> 9/29 Cefepime 10/2 >>  Vanco 10/2 >>10  Interim history/subjective:  No events, still poorly responsive unfortunately.  Objective   Blood pressure 136/65, pulse (!) 111, temperature 100 F (37.8 C), temperature source Axillary, resp. rate (!) 23, height _0  (1.727 m), weight 126 kg, SpO2 99 %.    Vent Mode: PRVC FiO2 (%):  [30 %-40 %] 40 % Set Rate:  [22 bmp] 22 bmp Vt Set:  [380 mL] 380 mL PEEP:  [8 cmH20] 8 cmH20 Pressure Support:  [14 cmH20] 14 cmH20 Plateau Pressure:  [19 cmH20-21 cmH20] 19 cmH20   Intake/Output Summary (Last 24 hours) at 03/29/2020 1601 Last data filed at 03/28/2020 2300 Gross per 24 hour  Intake 540 ml  Output 1260 ml  Net -720 ml   Filed Weights   03/27/20 0500 03/28/20 0456 03/29/20 0500  Weight: 127.8 kg 128 kg 126 kg   EXAM Constitutional: chronically ill obese woman in no acute distress Eyes: eyes are anicteric, reactive to light Ears, nose, mouth, and throat: trach in place, thick bloody secretions Cardiovascular: heart sounds are regular, ext are warm to touch. no edema Respiratory: Scattered rhonci, triggering vent Gastrointestinal: abdomen is  soft with + BS Skin: No rashes, normal turgor Neurologic: opens eyes to voice, winces to pain, still not following commands Psychiatric: cannot assess  Net --660 1260 cc UoP BUN/Cr creeping up  Resolved Problem List:  AKI on CKD- now close to euvolemic and new baseline Cr Hypernatremia  Cardiac arrest with hypoxic  and hypercarbic respiratory failure OHS Patient with prolonged downtime  > 45 minutes. 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  Clinically she improved remarkably after rewarming, following commands, interacting but then has slipped back into a persistent vegetative state. - Facial twitching resolved, biggest issue is persistent depressed mental status - Hold VPA after today - Continue trial of modafenil - Encourage day night cycles, lights on day/ off at night  Acute on chronic respiratory failure with hypoxia- now s/p trach R sided Pneumonia Tracheobroncomalacia OHS/OSA - trach possibly in suboptimal position, getting bivona adjustable to see if any possibility she can be weaned from vent - with TC trials presently she occludes airway against posterior wall due to lack of rigidity in structure of trachea  CKD- hold further lasix, trend BUN/Cr/UoP  Periorbital Fracture with Hyphema, stable Herniation of the left medial rectus muscle into the sinus with small amount of retro bulbar stranding.  Ophthalmologist noted patient's EOM intact and no limitations of gaze & no signs of entrapment seen.   ENT recommends to not repair orbital injury at this time. Neurosurgery consulted 9/28, stating no indication for repair unless clear CSF leak which the patient has no symptoms of.  - F/u with outpt optho   Hyperglycemia  - Levemir and SSI, goal 140-180  Constipation - PRN BM  At Risk Malnutrition  PEG Status  PEG (9/29) - TF  Disposition- family is now unsure they'd want to make [place] live on a  ventilator.  Degree of baseline lung issues may preclude weaning.  Ongoing discussions with final plans after trial of new adjustable length tracheostomy tube.  Still awaiting tube.  Best practice:  Diet: TF Pain/Anxiety/Delirium protocol (if indicated): hold VAP protocol (if indicated): yes  DVT prophylaxis: Heparin SQ/ SCDs GI prophylaxis: Protonix Glucose control: SSI & levemir BID  Mobility: PT Code Status: DNR Family Communication: updated at length by Marni Griffon on 10/12 Disposition: ICU pending vent liberation   The patient is critically ill with multiple organ systems failure and requires high complexity decision making for assessment and support, frequent evaluation and titration of therapies, application of advanced monitoring technologies and extensive interpretation of multiple databases. Critical Care Time devoted to patient care services described in this note independent of APP/resident time (if applicable)  is 33 minutes.   Erskine Emery MD [facility] 03/29/2020 8:33 AM Personal pager: [phone] If unanswered, please page CCM On-call: [phone]

## 2020-03-30 ENCOUNTER — Ambulatory Visit: Payer: Medicare HMO | Admitting: Podiatry

## 2020-03-30 DIAGNOSIS — J9621 Acute and chronic respiratory failure with hypoxia: Secondary | ICD-10-CM | POA: Diagnosis not present

## 2020-03-30 LAB — BASIC METABOLIC PANEL
Anion gap: 13 (ref 5–15)
BUN: 105 mg/dL — ABNORMAL HIGH (ref 8–23)
CO2: 23 mmol/L (ref 22–32)
Calcium: 9.2 mg/dL (ref 8.9–10.3)
Chloride: 102 mmol/L (ref 98–111)
Creatinine, Ser: 1.9 mg/dL — ABNORMAL HIGH (ref 0.44–1.00)
GFR, Estimated: 26 mL/min — ABNORMAL LOW (ref >60)
Glucose, Bld: 174 mg/dL — ABNORMAL HIGH (ref 70–99)
Potassium: 4.8 mmol/L (ref 3.5–5.1)
Sodium: 138 mmol/L (ref 135–145)

## 2020-03-30 LAB — GLUCOSE, CAPILLARY
Glucose-Capillary: 129 mg/dL — ABNORMAL HIGH (ref 70–99)
Glucose-Capillary: 151 mg/dL — ABNORMAL HIGH (ref 70–99)
Glucose-Capillary: 167 mg/dL — ABNORMAL HIGH (ref 70–99)
Glucose-Capillary: 178 mg/dL — ABNORMAL HIGH (ref 70–99)
Glucose-Capillary: 191 mg/dL — ABNORMAL HIGH (ref 70–99)
Glucose-Capillary: 222 mg/dL — ABNORMAL HIGH (ref 70–99)

## 2020-03-30 MED ORDER — LACTATED RINGERS IV SOLN: INTRAVENOUS | Status: DC

## 2020-03-30 NOTE — Plan of Care (Signed)
  Problem: Health Behavior/Discharge Planning: Goal: Ability to manage health-related needs will improve Outcome: Not Progressing   Problem: Clinical Measurements: Goal: Diagnostic test results will improve Outcome: Not Progressing Goal: Respiratory complications will improve Outcome: Not Progressing   Problem: Activity: Goal: Risk for activity intolerance will decrease Outcome: Not Progressing   Problem: Activity: Goal: Ability to tolerate increased activity will improve Outcome: Not Progressing   Problem: Respiratory: Goal: Ability to maintain a clear airway and adequate ventilation will improve Outcome: Not Progressing   Remains without improvement from a neurological standpoint, and remains vent dependent and unable to maintain a patent airway without support.

## 2020-03-30 NOTE — TOC Progression Note (Signed)
Transition of Care [facility]) - Progression Note    Patient Details  Name: SHANTALE HOLTMEYER MRN: [id number] Date of Birth: Oct 03, 1945  Transition of [facility] CM/SW [place], [place] Phone Number: 03/30/2020, 1:54 PM  Clinical Narrative:     CSW spoke with [place] Miners from [facility] and she is requesting additional clinicals for possible insurance authorization approval. CSW faxed over requested clinicals.  Patient has SNF bed at [facility]. Insurance authorization pending.  CSW will continue to follow.   Expected Discharge Plan: Skilled Nursing Facility Barriers to Discharge: Continued Medical Work up  Expected Discharge Plan and Services Expected Discharge Plan: [place]   Discharge Planning Services: CM Consult Post Acute Care Choice: Long Term Acute Care (LTAC) Living arrangements for the past 2 months: Single Family Home                                       Social Determinants of Health (SDOH) Interventions    Readmission Risk Interventions Readmission Risk Prevention Plan 03/14/2020  Transportation Screening Complete  PCP or Specialist Appt within 5-7 Days Complete  Home Care Screening Complete  Medication Review (RN CM) Complete

## 2020-03-30 NOTE — Progress Notes (Signed)
NAME:  Kimberly Howell, MRN:  [id number], DOB:  June 23, 1945, LOS: 32 ADMISSION DATE:  03/03/2020, CONSULTATION DATE:  03/30/20 REFERRING MD:  EDP, CHIEF COMPLAINT:  Cardiac arrest   Brief History   74 year old female with PMH chronic respiratory failure with COPD on 3 L home O2 and CPAP nightly, stage IV CKD, hypertension, morbid obesity who had a cardiac arrest with >45 minutes CPR before ROSC then lost pulses three more times.  Intubated and PCCM consulted for admission  Past Medical History  Morbid obesity with BMI of 50.0-59.9, adult ([place]) Chronic respiratory failure (HCC) CKD (chronic kidney disease) stage 3, GFR 30-59 ml/min (HCC) Essential hypertension OSA on CPAP  Significant Hospital Events   9/18 Admit to PCCM 9/28 Trach  10/4 Started Depakote   Consults:  Ophthamology ENT Neurology Palliative  Procedures:  9/18 ETT >> 9/28 9/21 CVC 3L >> to be taken out 9/29 post PEG 9/27 LP  9/28 Tracheostomy  9/29 IR PEG  10/6 Trach collar  Significant Diagnostic Tests:  CT head/C-spine/Maxillofacial >> Small left frontal scalp hematoma without skull fracture. Fracture of the left lamina papyracea with herniation of the left medial rectus muscle into the ethmoid sinus. Small amount of retro bulbar stranding, likely hemorrhage.  Right lung apex consolidation concerning for pneumonia. TTE 9/18 >> LVEF 60-65%; no regional wall abnormalities; G1DD, normal RV EEG 9/20 >> diffuse encephalopathy MRI brain 9/24 >> Findings are highly suspicious for hypoxic/ischemic injury. Two punctate acute infarcts within the paramedian right frontal lobe are difficult to exclude. Medially displaced acute fracture of the left lamina papyracea. There is also an acute, displaced fracture of the left fovea ethmoidalis. Orbital fat and the medial rectus muscle extend into the left ethmoid sinus. Additionally, orbital fat extends from the left ethmoid sinus through the fracture defect in the fovea ethmoidalis  intracranially into the left anterior cranial fossa. The intracranial component of orbital fat measures 1 cm and exerts mild local mass effect upon the anteroinferior left frontal lobe.   Micro Data:  9/18 Sars-CoV-2>>negative 9/18 MRSA >> positive 9/27 CSF culture >> negative 9/27 UC >> negative 9/27 BC x 2 >> negative 9/28 BAL >> negative 10/2 Tracheal aspirate >>  10/2 UA >> negative   Antimicrobials:  Unasyn 9/19 >> 9/25 Vancomycin >> 9/27 (1 dose) Ceftriaxone 9/27 >> 9/29 Cefepime 10/2 >>  Vanco 10/2 >>10  Interim history/subjective:  No events, tracks with eyes today!  Objective   Blood pressure 128/75, pulse (!) 114, temperature 100.3 F (37.9 C), temperature source Axillary, resp. rate (!) 29, height _0  (1.727 m), weight 124.2 kg, SpO2 100 %.    Vent Mode: PRVC FiO2 (%):  [40 %] 40 % Set Rate:  [22 bmp] 22 bmp Vt Set:  [380 mL] 380 mL PEEP:  [5 cmH20-8 cmH20] 8 cmH20 Plateau Pressure:  [18 cmH20-21 cmH20] 21 cmH20   Intake/Output Summary (Last 24 hours) at 03/30/2020 1024 Last data filed at 03/30/2020 0400 Gross per 24 hour  Intake 4870 ml  Output 1000 ml  Net 3870 ml   Filed Weights   03/28/20 0456 03/29/20 0500 03/30/20 0500  Weight: 128 kg 126 kg 124.2 kg   EXAM Constitutional: chronically ill obese woman in no acute distress Eyes: eyes are anicteric, reactive to light Ears, nose, mouth, and throat: trach in place, thick bloody secretions Cardiovascular: heart sounds are regular, ext are warm to touch. no edema Respiratory: Scattered rhonci, triggering vent Gastrointestinal: abdomen is soft with + BS Skin: No rashes,  normal turgor Neurologic: opens eyes to voice, winces to pain, follows me today with eyes and voice Psychiatric: cannot assess  Net +3.94L 1050 cc UoP BUN/Cr creeping up  Resolved Problem List:  AKI on CKD- now close to euvolemic and new baseline Cr Hypernatremia  Cardiac arrest with hypoxic and hypercarbic respiratory  failure OHS Patient with prolonged downtime  > 45 minutes. 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  Clinically she improved remarkably after rewarming, following commands, interacting but then has slipped back into a persistent vegetative state. - Facial twitching resolved, biggest issue is persistent depressed mental status - Continue to hold VPA - Continue trial of modafenil - Encourage day night cycles, lights on day/ off at night  Acute on chronic respiratory failure with hypoxia- now s/p trach R sided Pneumonia Tracheobroncomalacia OHS/OSA - trach possibly in suboptimal position, getting bivona adjustable to see if any possibility she can be weaned from vent - with TC trials presently she occludes airway against posterior wall due to lack of rigidity in structure of trachea  CKD- lasix held 10/14, will give some fluid back today, really want to avoid HD  Periorbital Fracture with Hyphema, stable Herniation of the left medial rectus muscle into the sinus with small amount of retro bulbar stranding.  Ophthalmologist noted patient's EOM intact and no limitations of gaze & no signs of entrapment seen.   ENT recommends to not repair orbital injury at this time. Neurosurgery consulted 9/28, stating no indication for repair unless clear CSF leak which the patient has no symptoms of.  - F/u with outpt optho   Hyperglycemia  - Levemir and SSI, goal 140-180  Constipation - PRN BM  At Risk Malnutrition  PEG Status  PEG (9/29) - TF  Disposition- family is now unsure they'd want to make [place] live  on a ventilator.  Degree of baseline lung issues may preclude weaning.  Ongoing discussions with final plans after trial of new adjustable length tracheostomy tube.  Still awaiting tube today.  Best practice:  Diet: TF Pain/Anxiety/Delirium protocol (if indicated): hold VAP protocol (if indicated): yes  DVT prophylaxis: Heparin SQ/ SCDs GI prophylaxis: Protonix Glucose control: SSI & levemir BID  Mobility: PT Code Status: DNR Family Communication: updated at length by Marni Griffon on 10/12 Disposition: ICU pending vent liberation   The patient is critically ill with multiple organ systems failure and requires high complexity decision making for assessment and support, frequent evaluation and titration of therapies, application of advanced monitoring technologies and extensive interpretation of multiple databases. Critical Care Time devoted to patient care services described in this note independent of APP/resident time (if applicable)  is 35 minutes.   Erskine Emery MD [place] 03/30/2020 10:24 AM Personal pager: [phone] If unanswered, please page CCM On-call: [phone]

## 2020-03-30 NOTE — Progress Notes (Signed)
SLP Cancellation Note  Patient Details Name: Kimberly Howell MRN: [id number] DOB: Jul 29, 1945   Cancelled treatment:       Reason Eval/Treat Not Completed: Medical issues which prohibited therapy. Pt remains on vent, but per chart, not following commands. Mentation does not appear to support use of inline PMV at this time. SLP will continue to follow.    Osie Bond., M.A. [place] [facility] Pager [phone] Office [phone]  03/30/2020, 7:12 AM

## 2020-03-30 NOTE — Progress Notes (Signed)
Physical Therapy Treatment/ Discharge Patient Details Name: Kimberly Howell MRN: [id number] DOB: 05-14-1946 Today's Date: 03/30/2020    History of Present Illness 74 yo admitted with cardiac arrest and fall on 9/18 s/p CPR with resultant left orbit fx. Intubated 9/18, trach 9/ 28, PEG 9/29. PMhx: morbid obesity, HTN, respiratory failure on 3L, CKD stage 4    PT Comments    Pt with continued lack of progression since admission. Pt remains on vent unable to follow commands or participate in HEP or mobility. Pt with improvement in at least performing visual tracking with delay this session. Pt will benefit from continued ROM with nursing but will not continue to follow at this time for therapy. Please reorder should pt status change and be able to participate.     Follow Up Recommendations  SNF;Supervision/Assistance - 24 hour     Equipment Recommendations  Hospital bed;Other (comment);Wheelchair (measurements PT);Wheelchair cushion (measurements PT)    Recommendations for Other Services       Precautions / Restrictions Precautions Precautions: Fall Precaution Comments: trach, PEG, vent    Mobility  Bed Mobility               General bed mobility comments: did not attempt mobility given lack of command following and cognition  Transfers                    Ambulation/Gait                 Stairs             Wheelchair Mobility    Modified Rankin (Stroke Patients Only)       Balance                                            Cognition Arousal/Alertness: Awake/alert Behavior During Therapy: Flat affect Overall Cognitive Status: Impaired/Different from baseline                                 General Comments: blinked to threat and grimaced to pain with ROM, visually tracking with delay with auditory cues. No active command following. Withdrawal on hands to nail bed pressure and movement of bil feet when  foot squeezed but not a true noxious stimuli      Exercises General Exercises - Upper Extremity Shoulder Flexion: PROM;Both;10 reps;Supine Shoulder ADduction: PROM;Both;Supine;10 reps Shoulder Horizontal ABduction: PROM;Both;10 reps;Supine Elbow Flexion: PROM;Both;10 reps;Supine Elbow Extension: AAROM;Both;Supine;PROM;10 reps General Exercises - Lower Extremity Heel Slides: PROM;Both;Supine;10 reps Hip ABduction/ADduction: Both;PROM;10 reps;Supine    General Comments        Pertinent Vitals/Pain Pain Assessment: Faces Pain Score: 4  Pain Location: B shoulders and R knee with ROM Pain Descriptors / Indicators: Grimacing Pain Intervention(s): Limited activity within patient's tolerance;Monitored during session;Repositioned    Home Living                      Prior Function            PT Goals (current goals can now be found in the care plan section) Progress towards PT goals: Not progressing toward goals - comment (pt with continued lack of command following and participation.)    Frequency           PT Plan Current plan remains appropriate  Co-evaluation              AM-PAC PT "6 Clicks" Mobility   Outcome Measure  Help needed turning from your back to your side while in a flat bed without using bedrails?: Total Help needed moving from lying on your back to sitting on the side of a flat bed without using bedrails?: Total Help needed moving to and from a bed to a chair (including a wheelchair)?: Total Help needed standing up from a chair using your arms (e.g., wheelchair or bedside chair)?: Total Help needed to walk in hospital room?: Total Help needed climbing 3-5 steps with a railing? : Total 6 Click Score: 6    End of Session   Activity Tolerance: Patient tolerated treatment well Patient left: in bed;with call bell/phone within reach Nurse Communication: Mobility status;Need for lift equipment PT Visit Diagnosis: Other abnormalities of  gait and mobility (R26.89);Muscle weakness (generalized) (M62.81);Other symptoms and signs involving the nervous system (R29.898)     Time: 7989-2119 PT Time Calculation (min) (ACUTE ONLY): 12 min  Charges:  $Therapeutic Exercise: 8-22 mins                     Kelci Petrella P, PT Acute Rehabilitation Services Pager: [phone] Office: [place] 03/30/2020, 12:30 PM

## 2020-03-30 NOTE — Progress Notes (Addendum)
CPT held due to physical therapy working with patient.

## 2020-03-31 DIAGNOSIS — Z515 Encounter for palliative care: Secondary | ICD-10-CM | POA: Diagnosis not present

## 2020-03-31 DIAGNOSIS — J9621 Acute and chronic respiratory failure with hypoxia: Secondary | ICD-10-CM | POA: Diagnosis not present

## 2020-03-31 DIAGNOSIS — G931 Anoxic brain damage, not elsewhere classified: Secondary | ICD-10-CM | POA: Diagnosis not present

## 2020-03-31 LAB — GLUCOSE, CAPILLARY
Glucose-Capillary: 147 mg/dL — ABNORMAL HIGH (ref 70–99)
Glucose-Capillary: 166 mg/dL — ABNORMAL HIGH (ref 70–99)
Glucose-Capillary: 169 mg/dL — ABNORMAL HIGH (ref 70–99)
Glucose-Capillary: 179 mg/dL — ABNORMAL HIGH (ref 70–99)
Glucose-Capillary: 187 mg/dL — ABNORMAL HIGH (ref 70–99)
Glucose-Capillary: 192 mg/dL — ABNORMAL HIGH (ref 70–99)
Glucose-Capillary: 193 mg/dL — ABNORMAL HIGH (ref 70–99)
Glucose-Capillary: 224 mg/dL — ABNORMAL HIGH (ref 70–99)

## 2020-03-31 LAB — BASIC METABOLIC PANEL WITH GFR
Anion gap: 13 (ref 5–15)
BUN: 111 mg/dL — ABNORMAL HIGH (ref 8–23)
CO2: 24 mmol/L (ref 22–32)
Calcium: 9.3 mg/dL (ref 8.9–10.3)
Chloride: 104 mmol/L (ref 98–111)
Creatinine, Ser: 1.9 mg/dL — ABNORMAL HIGH (ref 0.44–1.00)
GFR, Estimated: 26 mL/min — ABNORMAL LOW
Glucose, Bld: 195 mg/dL — ABNORMAL HIGH (ref 70–99)
Potassium: 5.1 mmol/L (ref 3.5–5.1)
Sodium: 141 mmol/L (ref 135–145)

## 2020-03-31 MED ORDER — SODIUM ZIRCONIUM CYCLOSILICATE 10 G PO PACK
10.0000 g | PACK | Freq: Two times a day (BID) | ORAL | Status: AC
  Administered 2020-03-31 (×2): 10 g via ORAL
  Filled 2020-03-31 (×3): qty 1

## 2020-03-31 NOTE — Progress Notes (Signed)
Daily Progress Note   Patient Name: Kimberly Howell       Date: 03/31/2020 DOB: 07/26/1945  Age: 74 y.o. MRN#: [id number] Attending Physician: Candee Furbish, MD Primary Care Physician: Seward Carol, MD Admit Date: 03/03/2020  Reason for Consultation/Follow-up:  To discuss complex medical decision making related to patient's goals of care  Chart reviewed.  Patient examined.   Subjective: Talked with [name] on the phone.  He explained that the family wants to keep going until she can't go anymore.  I talked with him a bit about what the future will look like bedbound with trach at SNF.  I asked if he had a "line in the sand" drawn in his mind so that his mother wouldn't suffer past a certain point.   [name] said he understands and he actually didn't want to go "down that path" at all.  He asked me to call his brother [name] because he felt that [place] did not understand and wanted to keep pushing.  I agreed to call [facility].  [name] said the most of the family was going along with keeping her alive so he was going along with it too.  I attempted to call [name] but received a voice mail.  I left him a message and hope to talk with him in the next day or so.   Assessment: Precious 74 yo female s/p cardiac arrest, >45 min to rosc.  Sustained anoxic brain injury.  Remains on vent support (8 of peep, 40 fiO2).  Has developed bronchomalacia necessitating a longer tracheostomy tube to attempt successful liberation from the vent.   Waiting for delivery of tube.   Patient Profile/HPI:  74 y.o. female  with past medical history of chronic respiratory failure on 3L at home and CPAP at night, she was followed by Dr. Valeta Harms of Pulmonary, stage IV CKD, morbid obesity, who was admitted on 03/03/2020 after  cardiac arrest. Her son [name] her her fall and initiated CPR. Per notes it took greater than 45 min to achieve ROSC and she lost pulses 3 more times in transport to the hospital.  She was intubated in the ER.  She had right lung pneumonia and sepsis.  LFTs were elevated, creatinine elevated, and has remained hypotensive and sedated while intubated.  10/16 patient appears to have stabilized on the  vent.  No longer on pressors.  She will meet my gaze but otherwise is unable to respond to me.  She lies comfortably and stares straight ahead at the ceiling.  Length of Stay: 28   Vital Signs: BP 138/69   Pulse (!) 115   Temp 99.4 F (37.4 C)   Resp (!) 23   Ht 5\' 8"  (1.727 m)   Wt 126.2 kg   SpO2 100%   BMI 42.30 kg/m  SpO2: SpO2: 100 % O2 Device: O2 Device: Ventilator O2 Flow Rate: O2 Flow Rate (L/min): 10 L/min       Palliative Assessment/Data: 10%     Palliative Care Plan    Recommendations/Plan:  Waiting for longer trach tube to determine if patient can wean successfully.  Family states they do not want her to live life connected to machines but at this point are going down the path of vent snf.  Seems to be some disagreement in the family about long term goals.  There is perhaps an opportunity to establish long term limitations.  Recommend Palliative to follow outpatient.  Code Status:  DNR  Prognosis:   Unable to determine  She is at high risk for acute decline and rehospitalization.  Discharge Planning:  Vent SNF  Care plan was discussed with son [name].  Thank you for allowing the Palliative Medicine Team to assist in the care of this patient.  Total time spent:  25 min.     Greater than 50%  of this time was spent counseling and coordinating care related to the above assessment and plan.  Florentina Jenny, PA-C Palliative Medicine  Please contact Palliative MedicineTeam phone at [phone] for questions and concerns between 7 am - 7 pm.   Please see  AMION for individual provider pager numbers.

## 2020-03-31 NOTE — Progress Notes (Signed)
CSW reached out to [facility] to check on the status of insurance auth, that information is not available on the weekends, only Monday-Friday.

## 2020-03-31 NOTE — Progress Notes (Signed)
NAME:  Kimberly Howell, MRN:  [id number], DOB:  02/08/1946, LOS: 3 ADMISSION DATE:  03/03/2020, CONSULTATION DATE:  03/31/20 REFERRING MD:  EDP, CHIEF COMPLAINT:  Cardiac arrest   Brief History   74 year old female with PMH chronic respiratory failure with COPD on 3 L home O2 and CPAP nightly, stage IV CKD, hypertension, morbid obesity who had a cardiac arrest with >45 minutes CPR before ROSC then lost pulses three more times.  Intubated and PCCM consulted for admission  Past Medical History  Morbid obesity with BMI of 50.0-59.9, adult ([place]) Chronic respiratory failure (HCC) CKD (chronic kidney disease) stage 3, GFR 30-59 ml/min (HCC) Essential hypertension OSA on CPAP  Significant Hospital Events   9/18 Admit to PCCM 9/28 Trach  10/4 Started Depakote   Consults:  Ophthamology ENT Neurology Palliative  Procedures:  9/18 ETT >> 9/28 9/21 CVC 3L >> to be taken out 9/29 post PEG 9/27 LP  9/28 Tracheostomy  9/29 IR PEG  10/6 Trach collar  Significant Diagnostic Tests:  CT head/C-spine/Maxillofacial >> Small left frontal scalp hematoma without skull fracture. Fracture of the left lamina papyracea with herniation of the left medial rectus muscle into the ethmoid sinus. Small amount of retro bulbar stranding, likely hemorrhage.  Right lung apex consolidation concerning for pneumonia. TTE 9/18 >> LVEF 60-65%; no regional wall abnormalities; G1DD, normal RV EEG 9/20 >> diffuse encephalopathy MRI brain 9/24 >> Findings are highly suspicious for hypoxic/ischemic injury. Two punctate acute infarcts within the paramedian right frontal lobe are difficult to exclude. Medially displaced acute fracture of the left lamina papyracea. There is also an acute, displaced fracture of the left fovea ethmoidalis. Orbital fat and the medial rectus muscle extend into the left ethmoid sinus. Additionally, orbital fat extends from the left ethmoid sinus through the fracture defect in the fovea ethmoidalis  intracranially into the left anterior cranial fossa. The intracranial component of orbital fat measures 1 cm and exerts mild local mass effect upon the anteroinferior left frontal lobe.   Micro Data:  9/18 Sars-CoV-2>>negative 9/18 MRSA >> positive 9/27 CSF culture >> negative 9/27 UC >> negative 9/27 BC x 2 >> negative 9/28 BAL >> negative 10/2 Tracheal aspirate >>  10/2 UA >> negative   Antimicrobials:  See fever tab  Interim history/subjective:  More sleepy today.  Still tracking but no motor response.  Objective   Blood pressure 101/70, pulse (!) 114, temperature 99.7 F (37.6 C), temperature source Axillary, resp. rate 16, height _0  (1.727 m), weight 126.2 kg, SpO2 99 %.    Vent Mode: PRVC FiO2 (%):  [40 %] 40 % Set Rate:  [22 bmp] 22 bmp Vt Set:  [380 mL] 380 mL PEEP:  [8 cmH20] 8 cmH20 Plateau Pressure:  [18 cmH20-25 cmH20] 25 cmH20   Intake/Output Summary (Last 24 hours) at 03/31/2020 1018 Last data filed at 03/31/2020 0800 Gross per 24 hour  Intake 3642.92 ml  Output 510 ml  Net 3132.92 ml   Filed Weights   03/29/20 0500 03/30/20 0500 03/31/20 0500  Weight: 126 kg 124.2 kg 126.2 kg   EXAM Constitutional: elderly woman in no acute distress Eyes: eyes are anicteric, reactive to light Ears, nose, mouth, and throat: mucous membranes dry, trach in place Cardiovascular: heart sounds are regular, ext are warm to touch. no edema Respiratory: diminished at bases, no accesory muscle use Gastrointestinal: abdomen is soft with + BS, PEG in place Skin: No rashes, normal turgor Neurologic: she will open eyes to voice and track,  winces to pain in ext but no motor response Psychiatric: RASS -1   Net +3.5 L 450 cc UoP BUN/Cr stable but elevated K 5  Resolved Problem List:   Hypernatremia  Cardiac arrest with hypoxic and hypercarbic respiratory failure OHS Patient with prolonged downtime  > 45 minutes. 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  Clinically she improved remarkably after rewarming, following commands, interacting but then has slipped back into a persistent vegetative state. - Facial twitching resolved, biggest issue is persistent depressed mental status, may be permanent unfortunately - Continue to hold VPA - Continue trial of modafenil - Encourage day night cycles, lights on day/ off at night  Acute on chronic respiratory failure with hypoxia- now s/p trach R sided Pneumonia Tracheobroncomalacia OHS/OSA - trach possibly in suboptimal position, getting bivona adjustable to see if any possibility she can be weaned from vent - with TC trials presently she occludes airway against posterior wall due to lack of rigidity in structure of trachea  CKD- lasix on hold, strict I/O, monitor renal function, not HD candidate  Periorbital Fracture with Hyphema, stable Herniation of the left medial rectus muscle into the sinus with small amount of retro bulbar stranding.  Ophthalmologist noted patient's EOM intact and no limitations of gaze & no signs of entrapment seen.   ENT recommends to not repair orbital injury at this time. Neurosurgery consulted 9/28, stating no indication for repair unless clear CSF leak which the patient has no symptoms of.  - F/u with outpt optho   Hyperglycemia  - Levemir and SSI, goal 140-180  Constipation - PRN BM  At Risk Malnutrition  PEG Status  PEG (9/29) - TF  Disposition- family is now unsure they'd want to make [place] live on a ventilator.  Degree of baseline lung issues may preclude weaning.  Ongoing discussions  with final plans after trial of new adjustable length tracheostomy tube.  Still awaiting tube...  Best practice:  Diet: TF Pain/Anxiety/Delirium protocol (if indicated): hold VAP protocol (if indicated): yes  DVT prophylaxis: Heparin SQ/ SCDs GI prophylaxis: Protonix Glucose control: SSI & levemir BID  Mobility: PT Code Status: DNR Family Communication: updated 10/15 when at bedside Disposition: ICU pending vent liberation    Erskine Emery MD [facility] 03/31/2020 10:18 AM Personal pager: [phone] If unanswered, please page CCM On-call: [phone]

## 2020-04-01 DIAGNOSIS — Z515 Encounter for palliative care: Secondary | ICD-10-CM | POA: Diagnosis not present

## 2020-04-01 DIAGNOSIS — J9601 Acute respiratory failure with hypoxia: Secondary | ICD-10-CM | POA: Diagnosis not present

## 2020-04-01 DIAGNOSIS — G931 Anoxic brain damage, not elsewhere classified: Secondary | ICD-10-CM | POA: Diagnosis not present

## 2020-04-01 DIAGNOSIS — Z930 Tracheostomy status: Secondary | ICD-10-CM | POA: Diagnosis not present

## 2020-04-01 LAB — GLUCOSE, CAPILLARY
Glucose-Capillary: 132 mg/dL — ABNORMAL HIGH (ref 70–99)
Glucose-Capillary: 120 mg/dL — ABNORMAL HIGH (ref 70–99)
Glucose-Capillary: 171 mg/dL — ABNORMAL HIGH (ref 70–99)
Glucose-Capillary: 149 mg/dL — ABNORMAL HIGH (ref 70–99)
Glucose-Capillary: 135 mg/dL — ABNORMAL HIGH (ref 70–99)
Glucose-Capillary: 123 mg/dL — ABNORMAL HIGH (ref 70–99)

## 2020-04-01 LAB — CBC
HCT: 25.8 % — ABNORMAL LOW (ref 36.0–46.0)
Hemoglobin: 8 g/dL — ABNORMAL LOW (ref 12.0–15.0)
MCH: 30.8 pg (ref 26.0–34.0)
MCHC: 31 g/dL (ref 30.0–36.0)
MCV: 99.2 fL (ref 80.0–100.0)
Platelets: 135 10*3/uL — ABNORMAL LOW (ref 150–400)
RBC: 2.6 MIL/uL — ABNORMAL LOW (ref 3.87–5.11)
RDW: 15.9 % — ABNORMAL HIGH (ref 11.5–15.5)
WBC: 11.8 10*3/uL — ABNORMAL HIGH (ref 4.0–10.5)
nRBC: 0.9 % — ABNORMAL HIGH (ref 0.0–0.2)

## 2020-04-01 LAB — BASIC METABOLIC PANEL
Anion gap: 11 (ref 5–15)
BUN: 106 mg/dL — ABNORMAL HIGH (ref 8–23)
CO2: 24 mmol/L (ref 22–32)
Calcium: 8.8 mg/dL — ABNORMAL LOW (ref 8.9–10.3)
Chloride: 105 mmol/L (ref 98–111)
Creatinine, Ser: 1.64 mg/dL — ABNORMAL HIGH (ref 0.44–1.00)
GFR, Estimated: 31 mL/min — ABNORMAL LOW (ref >60)
Glucose, Bld: 161 mg/dL — ABNORMAL HIGH (ref 70–99)
Potassium: 4.5 mmol/L (ref 3.5–5.1)
Sodium: 140 mmol/L (ref 135–145)

## 2020-04-01 MED ORDER — HEPARIN SODIUM (PORCINE) 5000 UNIT/ML IJ SOLN
5000.0000 [IU] | Freq: Three times a day (TID) | INTRAMUSCULAR | Status: DC
  Administered 2020-04-01 – 2020-04-11 (×28): 5000 [IU] via SUBCUTANEOUS
  Filled 2020-04-01 (×26): qty 1

## 2020-04-01 NOTE — Progress Notes (Signed)
NAME:  DISA RIEDLINGER, MRN:  [id number], DOB:  1945-09-29, LOS: 56 ADMISSION DATE:  03/03/2020, CONSULTATION DATE:  04/01/20 REFERRING MD:  EDP, CHIEF COMPLAINT:  Cardiac arrest   Brief History   74 year old female with PMH chronic respiratory failure with COPD on 3 L home O2 and CPAP nightly, stage IV CKD, hypertension, morbid obesity who had a cardiac arrest with >45 minutes CPR before ROSC then lost pulses three more times.  Intubated and PCCM consulted for admission  Past Medical History  Morbid obesity with BMI of 50.0-59.9, adult ([place]) Chronic respiratory failure (HCC) CKD (chronic kidney disease) stage 3, GFR 30-59 ml/min (HCC) Essential hypertension OSA on CPAP  Significant Hospital Events   9/18 Admit to PCCM 9/28 Trach  10/4 Started Depakote   Consults:  Ophthamology ENT Neurology Palliative  Procedures:  9/18 ETT >> 9/28 9/21 CVC 3L >> to be taken out 9/29 post PEG 9/27 LP  9/28 Tracheostomy  9/29 IR PEG  10/6 Trach collar  Significant Diagnostic Tests:  CT head/C-spine/Maxillofacial >> Small left frontal scalp hematoma without skull fracture. Fracture of the left lamina papyracea with herniation of the left medial rectus muscle into the ethmoid sinus. Small amount of retro bulbar stranding, likely hemorrhage.  Right lung apex consolidation concerning for pneumonia. TTE 9/18 >> LVEF 60-65%; no regional wall abnormalities; G1DD, normal RV EEG 9/20 >> diffuse encephalopathy MRI brain 9/24 >> Findings are highly suspicious for hypoxic/ischemic injury. Two punctate acute infarcts within the paramedian right frontal lobe are difficult to exclude. Medially displaced acute fracture of the left lamina papyracea. There is also an acute, displaced fracture of the left fovea ethmoidalis. Orbital fat and the medial rectus muscle extend into the left ethmoid sinus. Additionally, orbital fat extends from the left ethmoid sinus through the fracture defect in the fovea ethmoidalis  intracranially into the left anterior cranial fossa. The intracranial component of orbital fat measures 1 cm and exerts mild local mass effect upon the anteroinferior left frontal lobe.   Micro Data:  9/18 Sars-CoV-2>>negative 9/18 MRSA >> positive 9/27 CSF culture >> negative 9/27 UC >> negative 9/27 BC x 2 >> negative 9/28 BAL >> negative 10/2 Tracheal aspirate >>  10/2 UA >> negative   Antimicrobials:  See fever tab  Interim history/subjective:  Awake! Alert Follows commands with mouth and eyes, not getting her to move her ext much.  Objective   Blood pressure 112/63, pulse (!) 103, temperature 99.2 F (37.3 C), temperature source Oral, resp. rate (!) 28, height _0  (1.727 m), weight 127.9 kg, SpO2 100 %.    Vent Mode: PRVC FiO2 (%):  [40 %] 40 % Set Rate:  [22 bmp] 22 bmp Vt Set:  [380 mL] 380 mL PEEP:  [8 cmH20] 8 cmH20 Plateau Pressure:  [18 cmH20-20 cmH20] 20 cmH20   Intake/Output Summary (Last 24 hours) at 04/01/2020 2355 Last data filed at 04/01/2020 0400 Gross per 24 hour  Intake 2064.27 ml  Output 1225 ml  Net 839.27 ml   Filed Weights   03/30/20 0500 03/31/20 0500 04/01/20 0427  Weight: 124.2 kg 126.2 kg 127.9 kg   EXAM Ill woman on ventilator Tracheostomy in place with minimal secretions Lungs have shallow inspiratory effort and diminished at bases, no accessory muscle use, triggering vent Extremities show minimal edema She will track with eyes, she does appear to be mouthing words, she will stick out tongue to command, I am unable to get her to wiggle her toes or move her arms,  she does wince to any movement of her extremities   She had 1000 cc urine output BUN and creatinine are improved thankfully K improved Blood glucose is at goal  Resolved Problem List:   Hypernatremia  Cardiac arrest with hypoxic and hypercarbic respiratory failure OHS Patient with prolonged downtime  > 45 minutes. 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  Clinically she improved remarkably after rewarming, following commands, interacting but then has slipped back into a persistent vegetative state. -Her level of alertness today make me cautiously optimistic, if she continues to be this awake we will have family come visit -Continue to hold all sedating medicines  Acute on chronic respiratory failure with hypoxia- now s/p trach R sided Pneumonia Tracheobroncomalacia OHS/OSA - trach possibly in suboptimal position, getting bivona adjustable to see if any possibility she can be weaned from vent - with TC trials presently she occludes airway against posterior wall due to lack of rigidity in structure of trachea  CKD-continue to hold Lasix, monitor renal function and creatinine, she still appears on the dry side  Periorbital Fracture with Hyphema, stable Herniation of the left medial rectus muscle into the sinus with small amount of retro bulbar stranding.  Ophthalmologist noted patient's EOM intact and no limitations of gaze & no signs of entrapment seen.   ENT recommends to not repair orbital injury at this time. Neurosurgery consulted 9/28, stating no indication for repair unless clear CSF leak which the patient has no symptoms of.  - F/u with outpt optho   Hyperglycemia  - Levemir and SSI, goal 140-180  Constipation - PRN BM  At Risk Malnutrition  PEG Status  PEG (9/29) - TF  Disposition-if she continues this level of alertness I think the family will wish to pursue a vent LTAC.  We are still awaiting the adjustable bivona  Best practice:  Diet:  TF Pain/Anxiety/Delirium protocol (if indicated): hold VAP protocol (if indicated): yes  DVT prophylaxis: Heparin SQ/ SCDs GI prophylaxis: Protonix Glucose control: SSI & levemir BID  Mobility: PT Code Status: DNR Family Communication: will update when at bedside  Disposition: ICU pending vent liberation    Erskine Emery MD [facility] 04/01/2020 9:24 AM Personal pager: [phone] If unanswered, please page CCM On-call: [phone]

## 2020-04-02 DIAGNOSIS — G931 Anoxic brain damage, not elsewhere classified: Secondary | ICD-10-CM | POA: Diagnosis not present

## 2020-04-02 DIAGNOSIS — G9340 Encephalopathy, unspecified: Secondary | ICD-10-CM | POA: Diagnosis not present

## 2020-04-02 DIAGNOSIS — J9621 Acute and chronic respiratory failure with hypoxia: Secondary | ICD-10-CM | POA: Diagnosis not present

## 2020-04-02 LAB — GLUCOSE, CAPILLARY
Glucose-Capillary: 82 mg/dL (ref 70–99)
Glucose-Capillary: 141 mg/dL — ABNORMAL HIGH (ref 70–99)
Glucose-Capillary: 142 mg/dL — ABNORMAL HIGH (ref 70–99)
Glucose-Capillary: 160 mg/dL — ABNORMAL HIGH (ref 70–99)
Glucose-Capillary: 144 mg/dL — ABNORMAL HIGH (ref 70–99)

## 2020-04-02 LAB — BASIC METABOLIC PANEL
Anion gap: 11 (ref 5–15)
BUN: 96 mg/dL — ABNORMAL HIGH (ref 8–23)
CO2: 24 mmol/L (ref 22–32)
Calcium: 9 mg/dL (ref 8.9–10.3)
Chloride: 108 mmol/L (ref 98–111)
Creatinine, Ser: 1.46 mg/dL — ABNORMAL HIGH (ref 0.44–1.00)
GFR, Estimated: 36 mL/min — ABNORMAL LOW (ref >60)
Glucose, Bld: 96 mg/dL (ref 70–99)
Potassium: 4.6 mmol/L (ref 3.5–5.1)
Sodium: 143 mmol/L (ref 135–145)

## 2020-04-02 NOTE — Plan of Care (Signed)
  Problem: Clinical Measurements: Goal: Ability to maintain clinical measurements within normal limits will improve Outcome: Progressing Goal: Cardiovascular complication will be avoided Outcome: Progressing   Problem: Nutrition: Goal: Adequate nutrition will be maintained Outcome: Progressing   Problem: Elimination: Goal: Will not experience complications related to bowel motility Outcome: Progressing Goal: Will not experience complications related to urinary retention Outcome: Progressing   Problem: Clinical Measurements: Goal: Respiratory complications will improve Outcome: Not Progressing

## 2020-04-02 NOTE — Progress Notes (Signed)
NAME:  Kimberly Howell, MRN:  [id number], DOB:  1945/09/10, LOS: 60 ADMISSION DATE:  03/03/2020, CONSULTATION DATE:  04/02/20 REFERRING MD:  EDP, CHIEF COMPLAINT:  Cardiac arrest   Brief History   74 year old female with PMH chronic respiratory failure with COPD on 3 L home O2 and CPAP nightly, stage IV CKD, hypertension, morbid obesity who had a cardiac arrest with >45 minutes CPR before ROSC then lost pulses three more times.  Intubated and PCCM consulted for admission  Past Medical History  Morbid obesity with BMI of 50.0-59.9, adult ([place]) Chronic respiratory failure (HCC) CKD (chronic kidney disease) stage 3, GFR 30-59 ml/min (HCC) Essential hypertension OSA on CPAP  Significant Hospital Events   9/18 Admit to PCCM 9/28 Trach  10/4 Started Depakote   Consults:  Ophthamology ENT Neurology Palliative  Procedures:  9/18 ETT >> 9/28 9/21 CVC 3L >> to be taken out 9/29 post PEG 9/27 LP  9/28 Tracheostomy  9/29 IR PEG  10/6 Trach collar  Significant Diagnostic Tests:  CT head/C-spine/Maxillofacial >> Small left frontal scalp hematoma without skull fracture. Fracture of the left lamina papyracea with herniation of the left medial rectus muscle into the ethmoid sinus. Small amount of retro bulbar stranding, likely hemorrhage.  Right lung apex consolidation concerning for pneumonia. TTE 9/18 >> LVEF 60-65%; no regional wall abnormalities; G1DD, normal RV EEG 9/20 >> diffuse encephalopathy MRI brain 9/24 >> Findings are highly suspicious for hypoxic/ischemic injury. Two punctate acute infarcts within the paramedian right frontal lobe are difficult to exclude. Medially displaced acute fracture of the left lamina papyracea. There is also an acute, displaced fracture of the left fovea ethmoidalis. Orbital fat and the medial rectus muscle extend into the left ethmoid sinus. Additionally, orbital fat extends from the left ethmoid sinus through the fracture defect in the fovea ethmoidalis  intracranially into the left anterior cranial fossa. The intracranial component of orbital fat measures 1 cm and exerts mild local mass effect upon the anteroinferior left frontal lobe.   Micro Data:  9/18 Sars-CoV-2>>negative 9/18 MRSA >> positive 9/27 CSF culture >> negative 9/27 UC >> negative 9/27 BC x 2 >> negative 9/28 BAL >> negative 10/2 Tracheal aspirate >>  10/2 UA >> negative   Antimicrobials:  See fever tab  Interim history/subjective:  Opens eyes and turns head to voice. Does not move extremities to command  Objective   Blood pressure 120/65, pulse 100, temperature 99.7 F (37.6 C), temperature source Oral, resp. rate (!) 23, height _0  (1.727 m), weight 129.8 kg, SpO2 100 %.    Vent Mode: PRVC FiO2 (%):  [40 %] 40 % Set Rate:  [22 bmp] 22 bmp Vt Set:  [380 mL] 380 mL PEEP:  [8 cmH20] 8 cmH20 Plateau Pressure:  [18 KWI09-73 cmH20] 21 cmH20   Intake/Output Summary (Last 24 hours) at 04/02/2020 0727 Last data filed at 04/02/2020 0500 Gross per 24 hour  Intake 3012.73 ml  Output 1320 ml  Net 1692.73 ml   Filed Weights   03/31/20 0500 04/01/20 0427 04/02/20 0424  Weight: 126.2 kg 127.9 kg 129.8 kg   Physical Exam: General: Morbidly obese, no acute distress HENT: [place], AT, OP clear, MMM Neck: Trach in place, c/d/i Eyes: EOMI, no scleral icterus Respiratory: Coarse breath sounds bilaterally  No crackles, wheezing or rales Cardiovascular: RRR, -M/R/G, no JVD GI: BS+, soft, nontender Extremities:-Edema,-tenderness Neuro: Awake, tracks, does not follow commands except for intermittently closing eyes, does not withdraw to noxious stimuli GU: Purewick in place  CBC Latest Ref Rng & Units 04/01/2020 03/29/2020 03/26/2020  WBC 4.0 - 10.5 K/uL 11.8(H) 8.2 8.4  Hemoglobin 12.0 - 15.0 g/dL 8.0(L) 9.2(L) 9.4(L)  Hematocrit 36 - 46 % 25.8(L) 29.0(L) 31.0(L)  Platelets 150 - 400 K/uL 135(L) 142(L) 134(L)   CMP Latest Ref Rng & Units 04/02/2020 04/01/2020 03/31/2020   Glucose 70 - 99 mg/dL 96 161(H) 195(H)  BUN 8 - 23 mg/dL 96(H) 106(H) 111(H)  Creatinine 0.44 - 1.00 mg/dL 1.46(H) 1.64(H) 1.90(H)  Sodium 135 - 145 mmol/L 143 140 141  Potassium 3.5 - 5.1 mmol/L 4.6 4.5 5.1  Chloride 98 - 111 mmol/L 108 105 104  CO2 22 - 32 mmol/L _0 Calcium 8.9 - 10.3 mg/dL 9.0 8.8(L) 9.3  Total Protein 6.5 - 8.1 g/dL - - -  Total Bilirubin 0.3 - 1.2 mg/dL - - -  Alkaline Phos 38 - 126 U/L - - -  AST 15 - 41 U/L - - -  ALT 0 - 44 U/L - - -    Resolved Problem List:   Hypernatremia  Cardiac arrest with hypoxic and hypercarbic respiratory failure OHS Patient with prolonged downtime  > 45 minutes. 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  Clinically she improved remarkably after rewarming, following commands, interacting but then has slipped back into a persistent vegetative state. 10/18: Remains awake -Continue to hold all sedating medicines  Acute on chronic respiratory failure with hypoxia- now s/p trach R sided Pneumonia Tracheobroncomalacia OHS/OSA - trach possibly in suboptimal position, getting bivona adjustable to see if any possibility she can be weaned from vent - with TC trials presently she occludes airway against posterior wall due to lack of rigidity in structure of trachea - Continue weaning vent with trach collar trials - Will discuss LTACH placement  CKD - Holding diuretics for low-pressures - Discontinue maintenance fluids - Monitor UOP/Cr  Periorbital Fracture with Hyphema, stable Herniation of the left medial rectus muscle into the sinus with  small amount of retro bulbar stranding.  Ophthalmologist noted patient's EOM intact and no limitations of gaze & no signs of entrapment seen.   ENT recommends to not repair orbital injury at this time. Neurosurgery consulted 9/28, stating no indication for repair unless clear CSF leak which the patient has no symptoms of.  - F/u with outpt optho   Hyperglycemia  - Levemir and SSI, goal 140-180  Constipation - PRN BM  At Risk Malnutrition  PEG Status  PEG (9/29) - TF  Disposition  - Arrange for LTACH  Best practice:  Diet: TF Pain/Anxiety/Delirium protocol (if indicated): hold VAP protocol (if indicated): yes  DVT prophylaxis: Heparin SQ/ SCDs GI prophylaxis: Protonix Glucose control: SSI & levemir BID  Mobility: PT Code Status: DNR Family Communication: will call family Disposition: ICU pending vent liberation    The patient is critically ill with multiple organ systems failure and requires high complexity decision making for assessment and support, frequent evaluation and titration of therapies, application of advanced monitoring technologies and extensive interpretation of multiple databases.  Independent Critical Care Time: 33 Minutes.   Rodman Pickle, M.D. [facility]  Pulmonary/Critical Care Medicine 04/02/2020 7:27 AM   Please see Amion for pager number to reach on-call Pulmonary and Critical Care Team.

## 2020-04-02 NOTE — Plan of Care (Signed)
  Problem: Health Behavior/Discharge Planning: Goal: Ability to manage health-related needs will improve Outcome: Progressing   Problem: Clinical Measurements: Goal: Ability to maintain clinical measurements within normal limits will improve Outcome: Progressing Goal: Will remain free from infection Outcome: Progressing Goal: Diagnostic test results will improve Outcome: Progressing Goal: Respiratory complications will improve Outcome: Progressing Goal: Cardiovascular complication will be avoided Outcome: Progressing   Problem: Activity: Goal: Risk for activity intolerance will decrease Outcome: Progressing   Problem: Nutrition: Goal: Adequate nutrition will be maintained Outcome: Progressing   Problem: Coping: Goal: Level of anxiety will decrease Outcome: Progressing   Problem: Elimination: Goal: Will not experience complications related to bowel motility Outcome: Progressing Goal: Will not experience complications related to urinary retention Outcome: Progressing   Problem: Pain Managment: Goal: General experience of comfort will improve Outcome: Progressing   Problem: Safety: Goal: Ability to remain free from injury will improve Outcome: Progressing   Problem: Skin Integrity: Goal: Risk for impaired skin integrity will decrease Outcome: Progressing   Problem: Activity: Goal: Ability to tolerate increased activity will improve Outcome: Progressing   Problem: Respiratory: Goal: Ability to maintain a clear airway and adequate ventilation will improve Outcome: Progressing

## 2020-04-03 DIAGNOSIS — Z515 Encounter for palliative care: Secondary | ICD-10-CM | POA: Diagnosis not present

## 2020-04-03 DIAGNOSIS — J9621 Acute and chronic respiratory failure with hypoxia: Secondary | ICD-10-CM | POA: Diagnosis not present

## 2020-04-03 DIAGNOSIS — Z9911 Dependence on respirator [ventilator] status: Secondary | ICD-10-CM | POA: Diagnosis not present

## 2020-04-03 DIAGNOSIS — R6521 Severe sepsis with septic shock: Secondary | ICD-10-CM | POA: Diagnosis not present

## 2020-04-03 DIAGNOSIS — G931 Anoxic brain damage, not elsewhere classified: Secondary | ICD-10-CM | POA: Diagnosis not present

## 2020-04-03 DIAGNOSIS — A419 Sepsis, unspecified organism: Secondary | ICD-10-CM | POA: Diagnosis not present

## 2020-04-03 LAB — GLUCOSE, CAPILLARY
Glucose-Capillary: 120 mg/dL — ABNORMAL HIGH (ref 70–99)
Glucose-Capillary: 131 mg/dL — ABNORMAL HIGH (ref 70–99)
Glucose-Capillary: 133 mg/dL — ABNORMAL HIGH (ref 70–99)
Glucose-Capillary: 140 mg/dL — ABNORMAL HIGH (ref 70–99)
Glucose-Capillary: 156 mg/dL — ABNORMAL HIGH (ref 70–99)
Glucose-Capillary: 178 mg/dL — ABNORMAL HIGH (ref 70–99)

## 2020-04-03 LAB — BASIC METABOLIC PANEL
Anion gap: 13 (ref 5–15)
BUN: 92 mg/dL — ABNORMAL HIGH (ref 8–23)
CO2: 23 mmol/L (ref 22–32)
Calcium: 9.2 mg/dL (ref 8.9–10.3)
Chloride: 108 mmol/L (ref 98–111)
Creatinine, Ser: 1.48 mg/dL — ABNORMAL HIGH (ref 0.44–1.00)
GFR, Estimated: 35 mL/min — ABNORMAL LOW (ref >60)
Glucose, Bld: 154 mg/dL — ABNORMAL HIGH (ref 70–99)
Potassium: 4.9 mmol/L (ref 3.5–5.1)
Sodium: 144 mmol/L (ref 135–145)

## 2020-04-03 NOTE — Progress Notes (Signed)
Nutrition Follow-up  DOCUMENTATION CODES:   Morbid obesity  INTERVENTION:   Continue tube feedsvia PEG: - Vital AF 1.2 @ 60 ml/hr (1440 ml/day) - ProSource TF 45 ml daily - Free water per CCM, currently 100 ml q 4 hours  Tube feeding regimen provides1768kcal, 119grams of protein, and 1151m of H2O.  Total free water with flushes: 1768 ml  NUTRITION DIAGNOSIS:   Inadequate oral intake related to acute illness as evidenced by NPO status.  Ongoing  GOAL:   Patient will meet greater than or equal to 90% of their needs  Met via TF  MONITOR:   Vent status, TF tolerance, Labs, Weight trends  REASON FOR ASSESSMENT:   Consult Enteral/tube feeding initiation and management  ASSESSMENT:   74yo female admitted post cardiac arrest requiring 45-50 minutes of CPR before ROSC achieved followed by loss of pulse 3 more times; pt intubated and sedated; also with periorbital fracture, AKI.  PMH includes COPD on home oxygen, stage IV CKD, HTN, morbid obesity  9/28 - trach 9/29 - PEG  Pt tolerating current TF regimen. Pt with non-pitting edema to BUE and BLE per flowsheet documentation.  Per CCM notes, pt opening eyes and turning head to voice but not moving extremities to command. Plan is to continue weaning with TC trials. Pt on vent at this time.  Current TF: Vital AF 1.2 @ 60 ml/hr, ProSource TF 45 ml daily, free water 100 ml q 4 hours  Admit weight: 140.8 kg Current weight: 128 kg  Patient is currently on ventilator support via trach. MV: 10.2 L/min Temp (24hrs), Avg:99.8 F (37.7 C), Min:99.2 F (37.3 C), Max:100.5 F (38.1 C)  Medications reviewed and include: SSI q 4 hours, Novolog 6 units q 4 hours, levemir 18 units daily, protonix  Labs reviewed: BUN 92, creatinine 1.48, hemoglobin 8.0 CBG's: 120-160 x 24 hours  UOP: 1400 ml x 24 hours  Diet Order:   Diet Order    None      EDUCATION NEEDS:   Not appropriate for education at this time  Skin:   Skin Assessment: Skin Integrity Issues: Other: skin tear right leg  Last BM:  04/02/20 rectal tube  Height:   Ht Readings from Last 1 Encounters:  03/25/20 _0  (1.727 m)    Weight:   Wt Readings from Last 1 Encounters:  04/03/20 128 kg    Ideal Body Weight:  54.5 kg  BMI:  Body mass index is 42.91 kg/m.  Estimated Nutritional Needs:   Kcal:  1700-1900  Protein:  110-135 grams  Fluid:  >/= 1.5 L    KGaynell Face MS, RD, LDN Inpatient Clinical Dietitian Please see AMiON for contact information.

## 2020-04-03 NOTE — TOC Progression Note (Addendum)
Transition of Care [facility]) - Progression Note    Patient Details  Name: Kimberly Howell MRN: [id number] Date of Birth: October 08, 1945  Transition of Care [facility]) CM/SW [place], [place] Phone Number: 04/03/2020, 11:06 AM  Clinical Narrative:     Update 10/19 4:25pm-CSW received documents from family to provide to [facility] caseworker. CSW emailed documents to [facility] caseworker. CSW called medicaid caseworker and she confirmed that she received e-mail. Medicaid caseworker C. Quentin Cornwall confirmed that she is reviewing documents now, and will call CSW back to confirm if she has all documents requested to family. Noel Gerold confirmed with CSW that she will call  And inform Joy with [facility] once all requested documents are received.  CSW will continue to follow. Update 10/19 11:35am- CSW received call from patients medicaid caseworker who provided CSW with fax number as well as e-mail to send over paperwork  Needed for medicaid case. Caseworker confirmed with CSW that she will call Joy with [facility] to let her know she has received all paperwork needed in hopes that she will extend bed offer again to patient for SNF placement. Family is gathering paperwork now and will call CSW when they arrive so that [place] can fax over paperwork to [facility] caseworker.  CSW will continue to follow. Update on 10/19 at 11:11am-Patients son Kimberly Howell called CSW to ask if he could give paperwork to CSW to email or fax over to caseworker. CSW agreed. Patients son will bring paperwork to hospital today for CSW to email or fax over to [facility] caseworker.  CSW will continue to follow. CSW spoke with Joy with [facility] and they said they cannot extend a bed offer at this time until paperwork is given to patients medicaid caseworker. CSW contacted patients son Kimberly Howell to let him know to contact caseworker for medicaid to see what paperwork is needed. CSW gave [facility] caseworker contact information.  Kimberly Howell told CSW he will call caseworker today to try and submit paperwork needed by [facility] caseworker. Joy with [facility] confirmed if paperwork requested is received they may be able to extend a bed offer again. CSW contacted Prem with [facility] to see if they had any bed availability today since patient is now pending medicaid. Prem confirmed with CSW there is no bed availability at [facility] at this time. CSW tried to call Santiago Glad with [facility] to check on bed availability this week and no answer. CSW was unable to leave a voicemail. CSW will continue to try and call [facility] to check Bed Availability this week.  Expected Discharge Plan: Skilled Nursing Facility Barriers to Discharge: Continued Medical Work up  Expected Discharge Plan and Services Expected Discharge Plan: [place]   Discharge Planning Services: CM Consult Post Acute Care Choice: Long Term Acute Care (LTAC) Living arrangements for the past 2 months: Single Family Home                                       Social Determinants of Health (SDOH) Interventions    Readmission Risk Interventions Readmission Risk Prevention Plan 03/14/2020  Transportation Screening Complete  PCP or Specialist Appt within 5-7 Days Complete  Home Care Screening Complete  Medication Review (RN CM) Complete

## 2020-04-03 NOTE — Progress Notes (Signed)
NAME:  Kimberly Howell, MRN:  [id number], DOB:  24-May-1946, LOS: 63 ADMISSION DATE:  03/03/2020, CONSULTATION DATE:  04/03/20 REFERRING MD:  EDP, CHIEF COMPLAINT:  Cardiac arrest   Brief History   74 year old female with PMH chronic respiratory failure with COPD on 3 L home O2 and CPAP nightly, stage IV CKD, hypertension, morbid obesity who had a cardiac arrest with >45 minutes CPR before ROSC then lost pulses three more times.  Intubated and PCCM consulted for admission  Past Medical History  Morbid obesity with BMI of 50.0-59.9, adult ([place]) Chronic respiratory failure (HCC) CKD (chronic kidney disease) stage 3, GFR 30-59 ml/min (HCC) Essential hypertension OSA on CPAP  Significant Hospital Events   9/18 Admit to PCCM 9/28 Trach  10/4 Started Depakote   Consults:  Ophthamology ENT Neurology Palliative  Procedures:  9/18 ETT >> 9/28 9/21 CVC 3L >> to be taken out 9/29 post PEG 9/27 LP  9/28 Tracheostomy  9/29 IR PEG  10/6 Trach collar  Significant Diagnostic Tests:  CT head/C-spine/Maxillofacial >> Small left frontal scalp hematoma without skull fracture. Fracture of the left lamina papyracea with herniation of the left medial rectus muscle into the ethmoid sinus. Small amount of retro bulbar stranding, likely hemorrhage.  Right lung apex consolidation concerning for pneumonia. TTE 9/18 >> LVEF 60-65%; no regional wall abnormalities; G1DD, normal RV EEG 9/20 >> diffuse encephalopathy MRI brain 9/24 >> Findings are highly suspicious for hypoxic/ischemic injury. Two punctate acute infarcts within the paramedian right frontal lobe are difficult to exclude. Medially displaced acute fracture of the left lamina papyracea. There is also an acute, displaced fracture of the left fovea ethmoidalis. Orbital fat and the medial rectus muscle extend into the left ethmoid sinus. Additionally, orbital fat extends from the left ethmoid sinus through the fracture defect in the fovea ethmoidalis  intracranially into the left anterior cranial fossa. The intracranial component of orbital fat measures 1 cm and exerts mild local mass effect upon the anteroinferior left frontal lobe.   Micro Data:  9/18 Sars-CoV-2>>negative 9/18 MRSA >> positive 9/27 CSF culture >> negative 9/27 UC >> negative 9/27 BC x 2 >> negative 9/28 BAL >> negative 10/2 Tracheal aspirate >>  10/2 UA >> negative  10/11 Tracheal aspirate >> moderate candida albicans Antimicrobials:  See fever tab  Interim history/subjective:  Opens eyes and sticks out tongue on command. Tolerating 12/8 PS  Objective   Blood pressure 129/71, pulse 95, temperature 99.4 F (37.4 C), temperature source Axillary, resp. rate (!) 29, height _0  (1.727 m), weight 128 kg, SpO2 100 %.    Vent Mode: PSV;CPAP FiO2 (%):  [30 %-40 %] 40 % Set Rate:  [22 bmp] 22 bmp Vt Set:  [380 mL] 380 mL PEEP:  [8 cmH20] 8 cmH20 Pressure Support:  [12 cmH20] 12 cmH20 Plateau Pressure:  [14 cmH20-22 cmH20] 22 cmH20   Intake/Output Summary (Last 24 hours) at 04/03/2020 1551 Last data filed at 04/03/2020 1400 Gross per 24 hour  Intake 1380 ml  Output 1300 ml  Net 80 ml   Filed Weights   04/01/20 0427 04/02/20 0424 04/03/20 0500  Weight: 127.9 kg 129.8 kg 128 kg   Physical Exam: General: Morbidly obese, no acute distress HENT: [place], AT, OP clear, MMM Neck: Trach in place, c/d/i Eyes: EOMI, no scleral icterus Respiratory: Coarse breath sounds bilaterally. No crackles, wheezing or rales Cardiovascular: RRR, -M/R/G, no JVD GI: BS+, soft, nontender Extremities:-Edema,-tenderness Neuro: Awake, tracks, follows some commands, unable to withdraw extremities to  noxious stimuli  CBC Latest Ref Rng & Units 04/01/2020 03/29/2020 03/26/2020  WBC 4.0 - 10.5 K/uL 11.8(H) 8.2 8.4  Hemoglobin 12.0 - 15.0 g/dL 8.0(L) 9.2(L) 9.4(L)  Hematocrit 36 - 46 % 25.8(L) 29.0(L) 31.0(L)  Platelets 150 - 400 K/uL 135(L) 142(L) 134(L)    CMP Latest Ref Rng & Units  04/03/2020 04/02/2020 04/01/2020  Glucose 70 - 99 mg/dL 154(H) 96 161(H)  BUN 8 - 23 mg/dL 92(H) 96(H) 106(H)  Creatinine 0.44 - 1.00 mg/dL 1.48(H) 1.46(H) 1.64(H)  Sodium 135 - 145 mmol/L 144 143 140  Potassium 3.5 - 5.1 mmol/L 4.9 4.6 4.5  Chloride 98 - 111 mmol/L 108 108 105  CO2 22 - 32 mmol/L _0 Calcium 8.9 - 10.3 mg/dL 9.2 9.0 8.8(L)  Total Protein 6.5 - 8.1 g/dL - - -  Total Bilirubin 0.3 - 1.2 mg/dL - - -  Alkaline Phos 38 - 126 U/L - - -  AST 15 - 41 U/L - - -  ALT 0 - 44 U/L - - -    Resolved Problem List:   Hypernatremia  Cardiac arrest with hypoxic and hypercarbic respiratory failure OHS Patient with prolonged downtime  > 45 minutes. Presumed PEA Anemia 2/2 chronic disease, critical illness, stable  Hb 9.3, s/p 2 unit RBC on 9/25 and 10/2  Plan Trend cbc Transfuse for hgb < 7 HFpEF, baseline HTN Echo Grade 1 diastolic dysfunction Plan Cont strict I&O Holding his home amlodipine, carvedilol, lasix and ace-I   Assessment & Plan:   Acute on chronic respiratory failure with hypoxia- now s/p trach R sided Pneumonia Tracheobroncomalacia OHS/OSA - trach possibly in suboptimal position, getting bivona adjustable to see if any possibility she can be weaned from vent - with TC trials presently she occludes airway against posterior wall due to lack of rigidity in structure of trachea - PS as tolerated, return to full vent at night. Attempt trach collar trials when able - Continue weaning vent with trach collar trials - Social work for [employer] placement. If biovana trach does not arrive prior to [facility] placement, will send to patient's designated facility upon receipt  Acute encephalopathy secondary to sedative medications - improving Anoxic brain injury with anoxic myoclonus - improving Ammonia okay, MRI equivocal for prognosticating recovery.  Clinically she improved remarkably after rewarming, following commands, interacting but then has slipped back into a  persistent vegetative state. 10/18: Remains awake 10/19: Improving alertness -Continue to hold all sedating medicines  CKD - Holding diuretics for low-pressures - Discontinue maintenance fluids - Monitor UOP/Cr  Periorbital Fracture with Hyphema, stable Herniation of the left medial rectus muscle into the sinus with small amount of retro bulbar stranding.  Ophthalmologist noted patient's EOM intact and no limitations of gaze & no signs of entrapment seen.   ENT recommends to not repair orbital injury at this time. Neurosurgery consulted 9/28, stating no indication for repair unless clear CSF leak which the patient has no symptoms of.  - F/u with outpt optho   Hyperglycemia  - Levemir and SSI, goal 140-180  Constipation - PRN BM  At Risk Malnutrition  PEG Status  PEG (9/29) - TF  Disposition  - Arrange for LTACH  Best practice:  Diet: TF Pain/Anxiety/Delirium protocol (if indicated): hold VAP protocol (if indicated): yes  DVT prophylaxis: Heparin SQ/ SCDs GI prophylaxis: Protonix Glucose control: SSI & levemir BID  Mobility: PT Code Status: DNR Family Communication: Updated sister on 10/18 Disposition: ICU pending vent liberation   The  patient is critically ill with multiple organ systems failure and requires high complexity decision making for assessment and support, frequent evaluation and titration of therapies, application of advanced monitoring technologies and extensive interpretation of multiple databases.  Independent Critical Care Time: 31 Minutes.   Rodman Pickle, M.D. [facility] Pulmonary/Critical Care Medicine 04/03/2020 4:12 PM   Please see Amion for pager number to reach on-call Pulmonary and Critical Care Team.

## 2020-04-03 NOTE — Progress Notes (Addendum)
Patient ID: Kimberly Howell, female   DOB: [birth year], 74 y.o.   MRN: [id number]  This NP reviewed medical records, discussed case with team and then visited patient at the bedside as a follow for palliative medicine needs and emotional support.  Patient is s/p prolonged PEA arrest, today is day 71 of this hospitalization. She remains vent dependant, today she is more alert, responds to her name and follows simple commands of "stick out your tongue".  I continue to speak with family at bedside/sisters [name] and [name] when they are visiting patient for emotional support.  Spoke with  son/Brain by telephone.  Continued conversation regarding current medical situation.  Informed him that unfortunately the specially ordered adjustable trach has not come in.  Created space and opportunity for him to explore thoughts and feeling regarding his mother's current medical situation.   He tells me that as a family over the week-end a consensus decision to continue with all life prolonging measures was made.  He and his brother are in agreement and other family members support decision.  They understand the next steps are LTAC, likely outside this service area.  I informed [name] that his mother may be transitioned before adjustable trach arrives and that continued attempts to wean from vent can be safely trailed at other facilities.  He acknowledges this information.  He hopes that wean will take place here at [facility]. This is a very difficult time for [name] , his father is currently in a SNF and [name] is his his main support person too. Step-mother died this week.  Emotional support offered.  Plan of Care remains as  discussed previously in the week.  -DNR/DNI -Continue current medical interventions as family remain hopeful for improvement -Depending on outcomes in the days to come family understand they are faced with the difficult "what if's" decisions specific to life prolonging interventions    Discussed with [name] the importance of continued conversation with each other and the  medical providers regarding overall plan of care and treatment options,  ensuring decisions are within the context of the patients values and GOCs.     I offered to call brother [name] but [name] declines "that is not necessary, I keep him updated"        Unfortunately regardless of vent dependence the pateint has a likely long term poor prognosis for neurologic meaningful recovery.    PMT will continue to coordinate family meetings to enhance patient centered care.    Family encouraged to call with questions or concerns  Total time spent on the unit was 35  minutes    Discussed with bedside RN and Dr Loanne Drilling   Greater than 50% of the time was spent in counseling and coordination of care  Wadie Lessen NP  Palliative Medicine Team Team Phone # [phone] Pager [phone]

## 2020-04-03 NOTE — Plan of Care (Signed)
  Problem: Health Behavior/Discharge Planning: Goal: Ability to manage health-related needs will improve Outcome: Progressing   Problem: Clinical Measurements: Goal: Ability to maintain clinical measurements within normal limits will improve Outcome: Progressing Goal: Will remain free from infection Outcome: Progressing Goal: Diagnostic test results will improve Outcome: Progressing Goal: Respiratory complications will improve Outcome: Progressing Goal: Cardiovascular complication will be avoided Outcome: Progressing   Problem: Activity: Goal: Risk for activity intolerance will decrease Outcome: Progressing   Problem: Nutrition: Goal: Adequate nutrition will be maintained Outcome: Progressing   Problem: Coping: Goal: Level of anxiety will decrease Outcome: Progressing   Problem: Elimination: Goal: Will not experience complications related to bowel motility Outcome: Progressing Goal: Will not experience complications related to urinary retention Outcome: Progressing   Problem: Pain Managment: Goal: General experience of comfort will improve Outcome: Progressing   Problem: Safety: Goal: Ability to remain free from injury will improve Outcome: Progressing   Problem: Skin Integrity: Goal: Risk for impaired skin integrity will decrease Outcome: Progressing   Problem: Activity: Goal: Ability to tolerate increased activity will improve Outcome: Progressing   Problem: Respiratory: Goal: Ability to maintain a clear airway and adequate ventilation will improve Outcome: Progressing

## 2020-04-04 ENCOUNTER — Inpatient Hospital Stay (HOSPITAL_COMMUNITY): Payer: Medicare HMO

## 2020-04-04 DIAGNOSIS — A419 Sepsis, unspecified organism: Secondary | ICD-10-CM | POA: Diagnosis not present

## 2020-04-04 DIAGNOSIS — R6521 Severe sepsis with septic shock: Secondary | ICD-10-CM | POA: Diagnosis not present

## 2020-04-04 LAB — BASIC METABOLIC PANEL
Anion gap: 12 (ref 5–15)
BUN: 87 mg/dL — ABNORMAL HIGH (ref 8–23)
CO2: 24 mmol/L (ref 22–32)
Calcium: 9.2 mg/dL (ref 8.9–10.3)
Chloride: 107 mmol/L (ref 98–111)
Creatinine, Ser: 1.49 mg/dL — ABNORMAL HIGH (ref 0.44–1.00)
GFR, Estimated: 35 mL/min — ABNORMAL LOW (ref >60)
Glucose, Bld: 126 mg/dL — ABNORMAL HIGH (ref 70–99)
Potassium: 4.9 mmol/L (ref 3.5–5.1)
Sodium: 143 mmol/L (ref 135–145)

## 2020-04-04 LAB — GLUCOSE, CAPILLARY
Glucose-Capillary: 109 mg/dL — ABNORMAL HIGH (ref 70–99)
Glucose-Capillary: 131 mg/dL — ABNORMAL HIGH (ref 70–99)
Glucose-Capillary: 141 mg/dL — ABNORMAL HIGH (ref 70–99)
Glucose-Capillary: 145 mg/dL — ABNORMAL HIGH (ref 70–99)
Glucose-Capillary: 146 mg/dL — ABNORMAL HIGH (ref 70–99)
Glucose-Capillary: 155 mg/dL — ABNORMAL HIGH (ref 70–99)
Glucose-Capillary: 98 mg/dL (ref 70–99)

## 2020-04-04 LAB — CBC
HCT: 27.8 % — ABNORMAL LOW (ref 36.0–46.0)
Hemoglobin: 8.6 g/dL — ABNORMAL LOW (ref 12.0–15.0)
MCH: 30.7 pg (ref 26.0–34.0)
MCHC: 30.9 g/dL (ref 30.0–36.0)
MCV: 99.3 fL (ref 80.0–100.0)
Platelets: 169 10*3/uL (ref 150–400)
RBC: 2.8 MIL/uL — ABNORMAL LOW (ref 3.87–5.11)
RDW: 16.5 % — ABNORMAL HIGH (ref 11.5–15.5)
WBC: 12.3 10*3/uL — ABNORMAL HIGH (ref 4.0–10.5)
nRBC: 0.6 % — ABNORMAL HIGH (ref 0.0–0.2)

## 2020-04-04 NOTE — Progress Notes (Addendum)
NAME:  AIYANA STEGMANN, MRN:  [id number], DOB:  1945/08/18, LOS: 60 ADMISSION DATE:  03/03/2020, CONSULTATION DATE:  04/04/20 REFERRING MD:  EDP, CHIEF COMPLAINT:  Cardiac arrest   Brief History   74 year old female with PMH chronic respiratory failure with COPD on 3 L home O2 and CPAP nightly, stage IV CKD, hypertension, morbid obesity who had a cardiac arrest with >45 minutes CPR before ROSC then lost pulses three more times.  Intubated and PCCM consulted for admission  Past Medical History  Morbid obesity with BMI of 50.0-59.9, adult ([place]) Chronic respiratory failure (HCC) CKD (chronic kidney disease) stage 3, GFR 30-59 ml/min (HCC) Essential hypertension OSA on CPAP  Significant Hospital Events   9/18 Admit to PCCM 9/28 Trach  10/4 Started Depakote   Consults:  Ophthamology ENT Neurology Palliative  Procedures:  9/18 ETT >> 9/28 9/21 CVC 3L >> to be taken out 9/29 post PEG 9/27 LP  9/28 Tracheostomy  9/29 IR PEG  10/6 Trach collar  Significant Diagnostic Tests:  CT head/C-spine/Maxillofacial >> Small left frontal scalp hematoma without skull fracture. Fracture of the left lamina papyracea with herniation of the left medial rectus muscle into the ethmoid sinus. Small amount of retro bulbar stranding, likely hemorrhage.  Right lung apex consolidation concerning for pneumonia. TTE 9/18 >> LVEF 60-65%; no regional wall abnormalities; G1DD, normal RV EEG 9/20 >> diffuse encephalopathy MRI brain 9/24 >> Findings are highly suspicious for hypoxic/ischemic injury. Two punctate acute infarcts within the paramedian right frontal lobe are difficult to exclude. Medially displaced acute fracture of the left lamina papyracea. There is also an acute, displaced fracture of the left fovea ethmoidalis. Orbital fat and the medial rectus muscle extend into the left ethmoid sinus. Additionally, orbital fat extends from the left ethmoid sinus through the fracture defect in the fovea ethmoidalis  intracranially into the left anterior cranial fossa. The intracranial component of orbital fat measures 1 cm and exerts mild local mass effect upon the anteroinferior left frontal lobe.   Micro Data:  9/18 Sars-CoV-2>>negative 9/18 MRSA >> positive 9/27 CSF culture >> negative 9/27 UC >> negative 9/27 BC x 2 >> negative 9/28 BAL >> negative 10/2 Tracheal aspirate >>  10/2 UA >> negative  10/11 Tracheal aspirate >> moderate candida albicans Antimicrobials:  See fever tab  Interim history/subjective:  Febrile to 101.2. Currently afebrile Tolerated PS yesterday. Returned to full vent overnight and on PS again  Objective   Blood pressure (!) 96/51, pulse 86, temperature 98.4 F (36.9 C), temperature source Oral, resp. rate (!) 21, height _0  (1.727 m), weight 128.7 kg, SpO2 97 %.    Vent Mode: PSV;CPAP FiO2 (%):  [30 %-40 %] 30 % Set Rate:  [22 bmp] 22 bmp Vt Set:  [380 mL] 380 mL PEEP:  [8 cmH20] 8 cmH20 Pressure Support:  [12 cmH20] 12 cmH20 Plateau Pressure:  [17 cmH20-21 cmH20] 17 cmH20   Intake/Output Summary (Last 24 hours) at 04/04/2020 1132 Last data filed at 04/04/2020 0700 Gross per 24 hour  Intake 1340 ml  Output 1650 ml  Net -310 ml   Filed Weights   04/02/20 0424 04/03/20 0500 04/04/20 0533  Weight: 129.8 kg 128 kg 128.7 kg   Physical Exam: General: Morbidly obese, chronically ill-appearing, no acute distress HENT: [place], AT, OP clear, MMM Neck: Trach in place, c/d/i Eyes: EOMI, no scleral icterus Respiratory: Clear to auscultation bilaterally.  No crackles, wheezing or rales Cardiovascular: RRR, -M/R/G, no JVD GI: BS+, soft, nontender Extremities:-Edema,-tenderness Neuro:  Awake, tracks, follows some commands including tongue protrusion and turning neck, grimaces to painful stimuli, does not withdraw extremities   CBC Latest Ref Rng & Units 04/04/2020 04/01/2020 03/29/2020  WBC 4.0 - 10.5 K/uL 12.3(H) 11.8(H) 8.2  Hemoglobin 12.0 - 15.0 g/dL 8.6(L)  8.0(L) 9.2(L)  Hematocrit 36 - 46 % 27.8(L) 25.8(L) 29.0(L)  Platelets 150 - 400 K/uL 169 135(L) 142(L)    CMP Latest Ref Rng & Units 04/04/2020 04/03/2020 04/02/2020  Glucose 70 - 99 mg/dL 126(H) 154(H) 96  BUN 8 - 23 mg/dL 87(H) 92(H) 96(H)  Creatinine 0.44 - 1.00 mg/dL 1.49(H) 1.48(H) 1.46(H)  Sodium 135 - 145 mmol/L 143 144 143  Potassium 3.5 - 5.1 mmol/L 4.9 4.9 4.6  Chloride 98 - 111 mmol/L 107 108 108  CO2 22 - 32 mmol/L _0 Calcium 8.9 - 10.3 mg/dL 9.2 9.2 9.0  Total Protein 6.5 - 8.1 g/dL - - -  Total Bilirubin 0.3 - 1.2 mg/dL - - -  Alkaline Phos 38 - 126 U/L - - -  AST 15 - 41 U/L - - -  ALT 0 - 44 U/L - - -    Resolved Problem List:   Hypernatremia  Cardiac arrest with hypoxic and hypercarbic respiratory failure OHS Patient with prolonged downtime  > 45 minutes. Presumed PEA Anemia 2/2 chronic disease, critical illness, stable  Hb 9.3, s/p 2 unit RBC on 9/25 and 10/2  Plan Trend cbc Transfuse for hgb < 7 HFpEF, baseline HTN Echo Grade 1 diastolic dysfunction Plan Cont strict I&O Holding his home amlodipine, carvedilol, lasix and ace-I   Assessment & Plan:   Acute on chronic respiratory failure with hypoxia- now s/p trach Tracheobroncomalacia OHS/OSA - trach possibly in suboptimal position, getting bivona adjustable to see if any possibility she can be weaned from vent - with TC trials presently she occludes airway against posterior wall due to lack of rigidity in structure of trachea - PS as tolerated, return to full vent at night. Attempt trach collar trials when able - Continue weaning vent with trach collar trials - Physical therapy - Social work for [employer] placement. If biovana trach does not arrive prior to [facility] placement, will send to patient's designated facility upon receipt. Son has been updated and aware of plan. He requests we wait to do the trach exchange in the hospital but I have informed him that this does not require her to remain in  the hospital and can be performed at the [facility]. I advised him to accept a bed for his mother when it does become available.  Acute encephalopathy secondary to sedative medications - improving Anoxic brain injury with anoxic myoclonus - improving - Continue to hold all sedating medicines  Fever: Unclear etiology - Obtain blood and respiratory cultures - Low threshold to start antibiotics if patient decompensates  CKD - Monitor UOP/Cr  Periorbital Fracture with Hyphema, stable Herniation of the left medial rectus muscle into the sinus with small amount of retro bulbar stranding.  Ophthalmologist noted patient's EOM intact and no limitations of gaze & no signs of entrapment seen.   ENT recommends to not repair orbital injury at this time. Neurosurgery consulted 9/28, stating no indication for repair unless clear CSF leak which the patient has no symptoms of.  - F/u with outpt optho   Hyperglycemia  - Levemir and SSI, goal 140-180  Constipation - PRN BM  At Risk Malnutrition  PEG Status  PEG (9/29) - TF  Disposition  -  Arrange for [facility]  Best practice:  Diet: TF Pain/Anxiety/Delirium protocol (if indicated): hold VAP protocol (if indicated): yes  DVT prophylaxis: Heparin SQ/ SCDs GI prophylaxis: Protonix Glucose control: SSI & levemir BID  Mobility: PT Code Status: DNR Family Communication: Updated son on 10/20 Disposition: ICU pending vent liberation   The patient is critically ill with multiple organ systems failure and requires high complexity decision making for assessment and support, frequent evaluation and titration of therapies, application of advanced monitoring technologies and extensive interpretation of multiple databases.  Independent Critical Care Time: 33 Minutes.   Rodman Pickle, M.D. [facility] Pulmonary/Critical Care Medicine 04/04/2020 3:05 PM   Please see Amion for pager number to reach on-call Pulmonary and Critical Care Team.

## 2020-04-04 NOTE — Plan of Care (Signed)

## 2020-04-04 NOTE — TOC Progression Note (Addendum)
Transition of Care [facility]) - Progression Note    Patient Details  Name: Kimberly Howell MRN: [id number] Date of Birth: 12-17-1945  Transition of Care [facility]) CM/SW [place], [place] Phone Number: 04/04/2020, 12:28 PM  Clinical Narrative:      Update 10/20 4:14pm- CSW spoke with medicaid caseworker that informed CSW she needs two more additional documents from family to confirm medicaid eligibility. CSW called [name] patients sister to inform her of documents needed for [facility] eligibility. [name] will look for these documents and provide them CSW tomorrow to fax over to [employer].   CSW will continue to follow. CSW tried to call patients medicaid caseworker C. Quentin Cornwall to confirm if she has all documents needed. No answer. CSW left voicemail. CSW awaiting call back. CSW called Joy with [facility] to let her know that CSW submitted requested documents given by family to caseworker. CSW let Joy with [facility] know that CSW is awaiting call back from [employer].  CSW will continue to follow.  Expected Discharge Plan: Skilled Nursing Facility Barriers to Discharge: Continued Medical Work up  Expected Discharge Plan and Services Expected Discharge Plan: [place]   Discharge Planning Services: CM Consult Post Acute Care Choice: Long Term Acute Care (LTAC) Living arrangements for the past 2 months: Single Family Home                                       Social Determinants of Health (SDOH) Interventions    Readmission Risk Interventions Readmission Risk Prevention Plan 03/14/2020  Transportation Screening Complete  PCP or Specialist Appt within 5-7 Days Complete  Home Care Screening Complete  Medication Review (RN CM) Complete

## 2020-04-05 DIAGNOSIS — I469 Cardiac arrest, cause unspecified: Secondary | ICD-10-CM | POA: Diagnosis not present

## 2020-04-05 LAB — BLOOD CULTURE ID PANEL (REFLEXED) - BCID2

## 2020-04-05 LAB — GLUCOSE, CAPILLARY
Glucose-Capillary: 105 mg/dL — ABNORMAL HIGH (ref 70–99)
Glucose-Capillary: 110 mg/dL — ABNORMAL HIGH (ref 70–99)
Glucose-Capillary: 127 mg/dL — ABNORMAL HIGH (ref 70–99)
Glucose-Capillary: 132 mg/dL — ABNORMAL HIGH (ref 70–99)
Glucose-Capillary: 139 mg/dL — ABNORMAL HIGH (ref 70–99)
Glucose-Capillary: 146 mg/dL — ABNORMAL HIGH (ref 70–99)
Glucose-Capillary: 149 mg/dL — ABNORMAL HIGH (ref 70–99)

## 2020-04-05 NOTE — Progress Notes (Signed)
Placed on full support for the night

## 2020-04-05 NOTE — Evaluation (Signed)
Physical Therapy Evaluation Patient Details Name: Kimberly Howell MRN: [id number] DOB: 1946-02-28 Today's Date: 04/05/2020   History of Present Illness  74 yo admitted with cardiac arrest and fall on 9/18 s/p CPR with resultant left orbit fx. Intubated 9/18, trach 9/ 28, PEG 9/29. PMhx: morbid obesity, HTN, respiratory failure on 3L, CKD stage 4  Clinical Impression  Pt admitted with above diagnosis. Pt was able to follow commands to exercise at bed level eval. Did not attempt to sit pt up as nurse did not feel that that was a good idea today.  Will give PT trial given that pt is following some commands today.   Pt currently with functional limitations due to the deficits listed below (see PT Problem List). Pt will benefit from skilled PT to increase their independence and safety with mobility to allow discharge to the venue listed below.    Follow Up Recommendations SNF;Supervision/Assistance - 24 hour;LTACH (Vent SNF vs LTACH)    Equipment Recommendations  Other (comment) (TBA)    Recommendations for Other Services       Precautions / Restrictions Precautions Precautions: Fall Precaution Comments: trach, PEG, vent Restrictions Weight Bearing Restrictions: No      Mobility  Bed Mobility Overal bed mobility: Needs Assistance Bed Mobility: Rolling Rolling: +2 for physical assistance;Total assist         General bed mobility comments: Needs total assist due to pt unable to assist much due to weakness and slow to follow commands. Bed level eval due to nurse request.    Transfers                 General transfer comment: not attempted  Ambulation/Gait                Stairs            Wheelchair Mobility    Modified Rankin (Stroke Patients Only)       Balance                                             Pertinent Vitals/Pain Pain Assessment: Faces Faces Pain Scale: Hurts even more Pain Location: B shoulders and bil knee  with ROM Pain Descriptors / Indicators: Grimacing;Guarding;Aching Pain Intervention(s): Limited activity within patient's tolerance;Monitored during session;Repositioned    Home Living Family/patient expects to be discharged to:: Other (Comment) (LTACH vs vent SNF) Living Arrangements: Children Available Help at Discharge: Family;Available PRN/intermittently                  Prior Function Level of Independence: Independent with assistive device(s)         Comments: pt nodding head in response to being able bath, dress and walk with RW, assist for homemaking. Pt unable to further detail home setup     Hand Dominance   Dominant Hand: Right    Extremity/Trunk Assessment   Upper Extremity Assessment RUE Deficits / Details: pain response to shoulder ROM >90 degrees, full PROM elbow to hand, edematous RUE Coordination: decreased fine motor;decreased gross motor LUE Deficits / Details: pain response with shoulder ROM >90 degrees, full PROM elbow to hand, edematous LUE Coordination: decreased fine motor;decreased gross motor    Lower Extremity Assessment RLE Deficits / Details: pt able to wiggle toes, 1/5 active dorsiflexion/plantarflexion. 2-/5 knee extension, no active hip or knee flexion LLE Deficits / Details: pt able to  wiggle toes, 2-/5 ankle dorsiflexion/plantarflexion. 2-/5 knee extension, no active hip or knee flexion    Cervical / Trunk Assessment Cervical / Trunk Assessment: Other exceptions Cervical / Trunk Exceptions: significant weakness, obesity  Communication   Communication: Tracheostomy (attempts to mouth words)  Cognition Arousal/Alertness: Awake/alert Behavior During Therapy: Flat affect Overall Cognitive Status: Impaired/Different from baseline Area of Impairment: Orientation;Attention;Memory;Following commands                 Orientation Level: Disoriented to;Place;Time;Situation Current Attention Level: Focused Memory: Decreased short-term  memory Following Commands: Follows one step commands with increased time       General Comments: blinked to threat and grimaced to pain with ROM, visually tracking with delay with auditory cues.       General Comments      Exercises General Exercises - Upper Extremity Shoulder Flexion: Both;Supine;AAROM;5 reps Shoulder Extension: AAROM;Both;5 reps;Supine Elbow Flexion: Both;Supine;AAROM;5 reps Elbow Extension: AAROM;Both;Supine;5 reps Wrist Flexion: Both;5 reps;Supine;AAROM Wrist Extension: Both;5 reps;Supine;AAROM General Exercises - Lower Extremity Ankle Circles/Pumps: AAROM;Both;5 reps;Supine Quad Sets: AROM;Both;5 reps;Supine Heel Slides: PROM;Both;Supine;5 reps   Assessment/Plan    PT Assessment Patient needs continued PT services  PT Problem List Decreased strength;Decreased mobility;Decreased safety awareness;Decreased range of motion;Decreased coordination;Obesity;Decreased activity tolerance;Decreased cognition;Cardiopulmonary status limiting activity;Decreased balance;Decreased knowledge of use of DME       PT Treatment Interventions Functional mobility training;Therapeutic activities;Patient/family education;Cognitive remediation;Neuromuscular re-education;Balance training;DME instruction;Therapeutic exercise    PT Goals (Current goals can be found in the Care Plan section)  Acute Rehab PT Goals Patient Stated Goal: unable to state PT Goal Formulation: Patient unable to participate in goal setting Time For Goal Achievement: 04/19/20 Potential to Achieve Goals: Poor    Frequency Min 2X/week   Barriers to discharge Decreased caregiver support      Co-evaluation               AM-PAC PT "6 Clicks" Mobility  Outcome Measure Help needed turning from your back to your side while in a flat bed without using bedrails?: Total Help needed moving from lying on your back to sitting on the side of a flat bed without using bedrails?: Total Help needed moving to  and from a bed to a chair (including a wheelchair)?: Total Help needed standing up from a chair using your arms (e.g., wheelchair or bedside chair)?: Total Help needed to walk in hospital room?: Total Help needed climbing 3-5 steps with a railing? : Total 6 Click Score: 6    End of Session Equipment Utilized During Treatment: Gait belt;Other (comment) (vent 30% FiO2, PEEP 8) Activity Tolerance: Patient limited by fatigue;Patient limited by pain Patient left: in bed;with call bell/phone within reach;with bed alarm set;with SCD's reapplied Nurse Communication: Mobility status;Need for lift equipment PT Visit Diagnosis: Other abnormalities of gait and mobility (R26.89);Muscle weakness (generalized) (M62.81);Other symptoms and signs involving the nervous system (R29.898)    Time: 1443-1540 PT Time Calculation (min) (ACUTE ONLY): 18 min   Charges:   PT Evaluation $PT Eval Moderate Complexity: 1 Mod          Milliana Reddoch W,PT Acute Rehabilitation Services Pager:  [phone]  Office:  [phone]    Denice Paradise 04/05/2020, 2:02 PM

## 2020-04-05 NOTE — Progress Notes (Signed)
NAME:  Kimberly Howell, MRN:  [id number], DOB:  January 10, 1946, LOS: 53 ADMISSION DATE:  03/03/2020, CONSULTATION DATE:  04/05/20 REFERRING MD:  EDP, CHIEF COMPLAINT:  Cardiac arrest   Brief History   74 year old female with PMH chronic respiratory failure with COPD on 3 L home O2 and CPAP nightly, stage IV CKD, hypertension, morbid obesity who had a cardiac arrest with >45 minutes CPR before ROSC then lost pulses three more times.  Intubated and PCCM consulted for admission  Past Medical History  Morbid obesity with BMI of 50.0-59.9, adult ([place]) Chronic respiratory failure (HCC) CKD (chronic kidney disease) stage 3, GFR 30-59 ml/min (HCC) Essential hypertension OSA on CPAP  Significant Hospital Events   9/18 Admit to PCCM 9/28 Trach  10/4 Started Depakote   Consults:  Ophthamology ENT Neurology Palliative  Procedures:  9/18 ETT >> 9/28 9/21 CVC 3L >> to be taken out 9/29 post PEG 9/27 LP  9/28 Tracheostomy  9/29 IR PEG  10/6 Trach collar  Significant Diagnostic Tests:  CT head/C-spine/Maxillofacial >> Small left frontal scalp hematoma without skull fracture. Fracture of the left lamina papyracea with herniation of the left medial rectus muscle into the ethmoid sinus. Small amount of retro bulbar stranding, likely hemorrhage.  Right lung apex consolidation concerning for pneumonia. TTE 9/18 >> LVEF 60-65%; no regional wall abnormalities; G1DD, normal RV EEG 9/20 >> diffuse encephalopathy MRI brain 9/24 >> Findings are highly suspicious for hypoxic/ischemic injury. Two punctate acute infarcts within the paramedian right frontal lobe are difficult to exclude. Medially displaced acute fracture of the left lamina papyracea. There is also an acute, displaced fracture of the left fovea ethmoidalis. Orbital fat and the medial rectus muscle extend into the left ethmoid sinus. Additionally, orbital fat extends from the left ethmoid sinus through the fracture defect in the fovea ethmoidalis  intracranially into the left anterior cranial fossa. The intracranial component of orbital fat measures 1 cm and exerts mild local mass effect upon the anteroinferior left frontal lobe.   Micro Data:  9/18 Sars-CoV-2>>negative 9/18 MRSA >> positive 9/27 CSF culture >> negative 9/27 UC >> negative 9/27 BC x 2 >> negative 9/28 BAL >> negative 10/2 Tracheal aspirate >>  10/2 UA >> negative  10/11 Tracheal aspirate >> moderate candida albicans 10/20 resp cx: neg 10/20 blood: 1 of 2 positive for MRSE Antimicrobials:  none  Interim history/subjective:  10/21: cx with 1 of 2 + for MRSE. Suspected contaminant, fever is down with tmax 99.5  Objective   Blood pressure (!) 144/72, pulse 95, temperature 98.8 F (37.1 C), temperature source Oral, resp. rate (!) 28, height _0  (1.727 m), weight 128.8 kg, SpO2 96 %.    Vent Mode: PSV;CPAP FiO2 (%):  [30 %] 30 % PEEP:  [8 cmH20] 8 cmH20 Pressure Support:  [12 cmH20] 12 cmH20   Intake/Output Summary (Last 24 hours) at 04/05/2020 1258 Last data filed at 04/05/2020 1100 Gross per 24 hour  Intake 1980 ml  Output 1500 ml  Net 480 ml   Filed Weights   04/03/20 0500 04/04/20 0533 04/05/20 0500  Weight: 128 kg 128.7 kg 128.8 kg   Physical Exam: General: Morbidly obese, chronically ill-appearing, no acute distress HEENT: NCAT, mmmp, eomi, perrl Neck: Trach in place, c/d/i Respiratory: Clear to auscultation bilaterally.  No crackles, wheezing or rales Cardiovascular: RRR, -M/R/G, no JVD GI: BS+, soft, nontender Extremities:-Edema,-tenderness Neuro: Awake, tracks, follows some commands including tongue protrusion and turning neck, grimaces to painful stimuli, does not withdraw extremities  CBC Latest Ref Rng & Units 04/04/2020 04/01/2020 03/29/2020  WBC 4.0 - 10.5 K/uL 12.3(H) 11.8(H) 8.2  Hemoglobin 12.0 - 15.0 g/dL 8.6(L) 8.0(L) 9.2(L)  Hematocrit 36 - 46 % 27.8(L) 25.8(L) 29.0(L)  Platelets 150 - 400 K/uL 169 135(L) 142(L)     CMP Latest Ref Rng & Units 04/04/2020 04/03/2020 04/02/2020  Glucose 70 - 99 mg/dL 126(H) 154(H) 96  BUN 8 - 23 mg/dL 87(H) 92(H) 96(H)  Creatinine 0.44 - 1.00 mg/dL 1.49(H) 1.48(H) 1.46(H)  Sodium 135 - 145 mmol/L 143 144 143  Potassium 3.5 - 5.1 mmol/L 4.9 4.9 4.6  Chloride 98 - 111 mmol/L 107 108 108  CO2 22 - 32 mmol/L _0 Calcium 8.9 - 10.3 mg/dL 9.2 9.2 9.0  Total Protein 6.5 - 8.1 g/dL - - -  Total Bilirubin 0.3 - 1.2 mg/dL - - -  Alkaline Phos 38 - 126 U/L - - -  AST 15 - 41 U/L - - -  ALT 0 - 44 U/L - - -    Resolved Problem List:   Hypernatremia  Cardiac arrest with hypoxic and hypercarbic respiratory failure OHS Patient with prolonged downtime  > 45 minutes. Presumed PEA Anemia 2/2 chronic disease, critical illness, stable  Hb 9.3, s/p 2 unit RBC on 9/25 and 10/2  Plan Trend cbc Transfuse for hgb < 7 HFpEF, baseline HTN Echo Grade 1 diastolic dysfunction Plan Cont strict I&O Holding his home amlodipine, carvedilol, lasix and ace-I   Assessment & Plan:   Acute on chronic respiratory failure with hypoxia- now s/p trach Tracheobroncomalacia OHS/OSA - trach possibly in suboptimal position, getting bivona adjustable to see if any possibility she can be weaned from vent - with TC trials presently she occludes airway against posterior wall due to lack of rigidity in structure of trachea - PS as tolerated, return to full vent at night. Attempt trach collar trials when able - Physical therapy - Social work for [facility] placement. If biovana trach does not arrive prior to [facility] placement, will send to patient's designated facility upon receipt. Son has been updated and aware of plan. He requests we wait to do the trach exchange in the hospital but I have informed him that this does not require her to remain in the hospital and can be performed at the [facility]. I advised him to accept a bed for his mother when it does become available.  Acute encephalopathy secondary  to sedative medications -  Anoxic brain injury with anoxic myoclonus - improving - Continue to hold all sedating medicines  Fever: Unclear etiology - cultures negative to etioliogy - fevers improved  CKD - Monitor UOP/Cr -lab holiday  Periorbital Fracture with Hyphema, stable Herniation of the left medial rectus muscle into the sinus with small amount of retro bulbar stranding.  Ophthalmologist noted patient's EOM intact and no limitations of gaze & no signs of entrapment seen.   ENT recommends to not repair orbital injury at this time. Neurosurgery consulted 9/28, stating no indication for repair unless clear CSF leak which the patient has no symptoms of.  - F/u with outpt optho   Hyperglycemia  - Levemir and SSI, goal 140-180  Constipation - PRN BM  At Risk Malnutrition  PEG Status  PEG (9/29) - TF  Disposition  - Arrange for LTACH  Best practice:  Diet: TF Pain/Anxiety/Delirium protocol (if indicated): hold VAP protocol (if indicated): yes  DVT prophylaxis: Heparin SQ/ SCDs GI prophylaxis: Protonix Glucose control: SSI & levemir BID  Mobility: PT Code Status: DNR Family Communication: pending Disposition: ICU pending vent liberation   Critical care time: The patient is critically ill with multiple organ systems failure and requires high complexity decision making for assessment and support, frequent evaluation and titration of therapies, application of advanced monitoring technologies and extensive interpretation of multiple databases.  Critical care time 33 mins. This represents my time independent of the NPs time taking care of the pt. This is excluding procedures.    [place] [facility]monary and Critical Care 04/05/2020, 12:58 PM

## 2020-04-05 NOTE — Progress Notes (Signed)
PHARMACY - PHYSICIAN COMMUNICATION CRITICAL VALUE ALERT - BLOOD CULTURE IDENTIFICATION (BCID)  Kimberly Howell is an 74 y.o. female who presented to [facility] on 03/03/2020 with a chief complaint of cardiac arrest  Assessment:  Gram positive cocci in clusters in the anaerobic bottle (1/3) with BCID showing MRSE. Organism is likely a contaminant; would not recommend initiating antibiotics at this time  Name of physician (or Provider) Contacted: [place] Crane, PharmD  Current antibiotics: N/A  Changes to prescribed antibiotics recommended:  Pending provider response  Results for orders placed or performed during the hospital encounter of 03/03/20  Blood Culture ID Panel (Reflexed) (Collected: 04/04/2020  8:28 AM)  Result Value Ref Range   Enterococcus faecalis NOT DETECTED NOT DETECTED   Enterococcus Faecium NOT DETECTED NOT DETECTED   Listeria monocytogenes NOT DETECTED NOT DETECTED   Staphylococcus species DETECTED (A) NOT DETECTED   Staphylococcus aureus (BCID) NOT DETECTED NOT DETECTED   Staphylococcus epidermidis DETECTED (A) NOT DETECTED   Staphylococcus lugdunensis NOT DETECTED NOT DETECTED   Streptococcus species NOT DETECTED NOT DETECTED   Streptococcus agalactiae NOT DETECTED NOT DETECTED   Streptococcus pneumoniae NOT DETECTED NOT DETECTED   Streptococcus pyogenes NOT DETECTED NOT DETECTED   A.calcoaceticus-baumannii NOT DETECTED NOT DETECTED   Bacteroides fragilis NOT DETECTED NOT DETECTED   Enterobacterales NOT DETECTED NOT DETECTED   Enterobacter cloacae complex NOT DETECTED NOT DETECTED   Escherichia coli NOT DETECTED NOT DETECTED   Klebsiella aerogenes NOT DETECTED NOT DETECTED   Klebsiella oxytoca NOT DETECTED NOT DETECTED   Klebsiella pneumoniae NOT DETECTED NOT DETECTED   Proteus species NOT DETECTED NOT DETECTED   Salmonella species NOT DETECTED NOT DETECTED   Serratia marcescens NOT DETECTED NOT DETECTED   Haemophilus influenzae NOT DETECTED NOT DETECTED    Neisseria meningitidis NOT DETECTED NOT DETECTED   Pseudomonas aeruginosa NOT DETECTED NOT DETECTED   Stenotrophomonas maltophilia NOT DETECTED NOT DETECTED   Candida albicans NOT DETECTED NOT DETECTED   Candida auris NOT DETECTED NOT DETECTED   Candida glabrata NOT DETECTED NOT DETECTED   Candida krusei NOT DETECTED NOT DETECTED   Candida parapsilosis NOT DETECTED NOT DETECTED   Candida tropicalis NOT DETECTED NOT DETECTED   Cryptococcus neoformans/gattii NOT DETECTED NOT DETECTED   Methicillin resistance mecA/C DETECTED (A) NOT DETECTED    Dimple Nanas, PharmD PGY-1 Acute Care Pharmacy Resident Office: [phone] 04/05/2020 8:50 AM

## 2020-04-05 NOTE — Plan of Care (Signed)
  Problem: Clinical Measurements: Goal: Ability to maintain clinical measurements within normal limits will improve Outcome: Progressing Goal: Will remain free from infection Outcome: Progressing Goal: Diagnostic test results will improve Outcome: Progressing Goal: Respiratory complications will improve Outcome: Progressing Goal: Cardiovascular complication will be avoided Outcome: Progressing   Problem: Nutrition: Goal: Adequate nutrition will be maintained Outcome: Progressing   Problem: Coping: Goal: Level of anxiety will decrease Outcome: Progressing   Problem: Elimination: Goal: Will not experience complications related to bowel motility Outcome: Progressing Goal: Will not experience complications related to urinary retention Outcome: Progressing   Problem: Pain Managment: Goal: General experience of comfort will improve Outcome: Progressing   Problem: Safety: Goal: Ability to remain free from injury will improve Outcome: Progressing   Problem: Skin Integrity: Goal: Risk for impaired skin integrity will decrease Outcome: Progressing   Problem: Health Behavior/Discharge Planning: Goal: Ability to manage health-related needs will improve Outcome: Not Applicable   Problem: Activity: Goal: Risk for activity intolerance will decrease Outcome: Not Applicable

## 2020-04-05 NOTE — TOC Progression Note (Addendum)
Transition of Care [facility]) - Progression Note    Patient Details  Name: AMYRAH PINKHASOV MRN: [id number] Date of Birth: 09-09-1945  Transition of Care [facility]) CM/SW [place], [place] Phone Number: 04/05/2020, 3:30 PM  Clinical Narrative:      Update 10/21 3:30pm- CSW spoke with [name] to see if she was able to drop off policy to DSS [facility]. [name] confirmed with CSW that she is going to drop paperwork off within the next hour. TOC team will follow up in the am with [name] in am to see if she was able to drop off the policy to DSS. Then will call C. Quentin Cornwall to see if she has everything she needs to confirm with [facility] that patient has medicaid eligibility . [name] patients sisters number is [phone]. Medicaid caseworker C. Quentin Cornwall number is [phone]. If Medicaid caseworker has everything she needs and [place] extends bed offer please request for [place] to start insurance authorization. CSW updated family.  TOC team will continue to follow.  10/21 8:30am-CSW spoke with [name] patients sister. [name] confirmed with CSW that she is going to drop off policy requested by [employer] C. Quentin Cornwall at the [facility] office.   Expected Discharge Plan: Skilled Nursing Facility Barriers to Discharge: Continued Medical Work up  Expected Discharge Plan and Services Expected Discharge Plan: [place]   Discharge Planning Services: CM Consult Post Acute Care Choice: Long Term Acute Care (LTAC) Living arrangements for the past 2 months: Single Family Home                                       Social Determinants of Health (SDOH) Interventions    Readmission Risk Interventions Readmission Risk Prevention Plan 03/14/2020  Transportation Screening Complete  PCP or Specialist Appt within 5-7 Days Complete  Home Care Screening Complete  Medication Review (RN CM) Complete

## 2020-04-06 DIAGNOSIS — J9621 Acute and chronic respiratory failure with hypoxia: Secondary | ICD-10-CM | POA: Diagnosis not present

## 2020-04-06 LAB — CBC
HCT: 27.3 % — ABNORMAL LOW (ref 36.0–46.0)
Hemoglobin: 8.5 g/dL — ABNORMAL LOW (ref 12.0–15.0)
MCH: 31.4 pg (ref 26.0–34.0)
MCHC: 31.1 g/dL (ref 30.0–36.0)
MCV: 100.7 fL — ABNORMAL HIGH (ref 80.0–100.0)
Platelets: 187 10*3/uL (ref 150–400)
RBC: 2.71 MIL/uL — ABNORMAL LOW (ref 3.87–5.11)
RDW: 16.9 % — ABNORMAL HIGH (ref 11.5–15.5)
WBC: 11.7 10*3/uL — ABNORMAL HIGH (ref 4.0–10.5)
nRBC: 0.3 % — ABNORMAL HIGH (ref 0.0–0.2)

## 2020-04-06 LAB — GLUCOSE, CAPILLARY
Glucose-Capillary: 109 mg/dL — ABNORMAL HIGH (ref 70–99)
Glucose-Capillary: 132 mg/dL — ABNORMAL HIGH (ref 70–99)
Glucose-Capillary: 133 mg/dL — ABNORMAL HIGH (ref 70–99)
Glucose-Capillary: 141 mg/dL — ABNORMAL HIGH (ref 70–99)
Glucose-Capillary: 154 mg/dL — ABNORMAL HIGH (ref 70–99)
Glucose-Capillary: 165 mg/dL — ABNORMAL HIGH (ref 70–99)

## 2020-04-06 LAB — BASIC METABOLIC PANEL
Anion gap: 12 (ref 5–15)
BUN: 83 mg/dL — ABNORMAL HIGH (ref 8–23)
CO2: 22 mmol/L (ref 22–32)
Calcium: 9.2 mg/dL (ref 8.9–10.3)
Chloride: 111 mmol/L (ref 98–111)
Creatinine, Ser: 1.41 mg/dL — ABNORMAL HIGH (ref 0.44–1.00)
GFR, Estimated: 40 mL/min — ABNORMAL LOW (ref >60)
Glucose, Bld: 139 mg/dL — ABNORMAL HIGH (ref 70–99)
Potassium: 5.2 mmol/L — ABNORMAL HIGH (ref 3.5–5.1)
Sodium: 145 mmol/L (ref 135–145)

## 2020-04-06 LAB — MRSA PCR SCREENING: MRSA by PCR: POSITIVE — AB

## 2020-04-06 MED ORDER — MUPIROCIN 2 % EX OINT
1.0000 "application " | TOPICAL_OINTMENT | Freq: Two times a day (BID) | CUTANEOUS | Status: DC
  Administered 2020-04-07 – 2020-04-11 (×9): 1 via NASAL

## 2020-04-06 NOTE — TOC Progression Note (Addendum)
Transition of Care [facility]) - Progression Note    Patient Details  Name: TOMISHA REPPUCCI MRN: [id number] Date of Birth: [birth year]  Transition of Care [facility]) CM/SW [place], LCSW Phone Number: 04/06/2020, 9:21 AM  Clinical Narrative:    9:21am-CSW left voicemail for patient's sister [name] [phone]) to confirm if she dropped off the needed life insurance documents to [facility] caseworker. CSW also left voicemail for Medicaid worker C. Quentin Cornwall [phone]) to confirm as well.   4pm-CSW spoke with Noel Gerold. She confirmed she did receive the life insurance policy. She provided Medicaid pending ID: [id number] S. CSW left voicemail for Joy with [facility] to see if they can confirm bed offer and start insurance approval.    Expected Discharge Plan: [place] Barriers to Discharge: Continued Medical Work up  Expected Discharge Plan and Services Expected Discharge Plan: [place]   Discharge Planning Services: CM Consult Post Acute Care Choice: Long Term Acute Care (LTAC) Living arrangements for the past 2 months: Single Family Home                                       Social Determinants of Health (SDOH) Interventions    Readmission Risk Interventions Readmission Risk Prevention Plan 03/14/2020  Transportation Screening Complete  PCP or Specialist Appt within 5-7 Days Complete  Home Care Screening Complete  Medication Review (RN CM) Complete

## 2020-04-06 NOTE — Progress Notes (Signed)
NAME:  Kimberly Howell, MRN:  [id number], DOB:  1946-06-11, LOS: 67 ADMISSION DATE:  03/03/2020, CONSULTATION DATE:  04/06/20 REFERRING MD:  EDP, CHIEF COMPLAINT:  Cardiac arrest   Brief History   74 year old female with PMH chronic respiratory failure with COPD on 3 L home O2 and CPAP nightly, stage IV CKD, hypertension, morbid obesity who had a cardiac arrest with >45 minutes CPR before ROSC then lost pulses three more times.  Intubated and PCCM consulted for admission  Past Medical History  Morbid obesity with BMI of 50.0-59.9, adult ([place]) Chronic respiratory failure (HCC) CKD (chronic kidney disease) stage 3, GFR 30-59 ml/min (HCC) Essential hypertension OSA on CPAP  Significant Hospital Events   9/18 Admit to PCCM 9/28 Trach  10/4 Started Depakote   Consults:  Ophthamology ENT Neurology Palliative  Procedures:  9/18 ETT >> 9/28 9/21 CVC 3L >> to be taken out 9/29 post PEG 9/27 LP  9/28 Tracheostomy  9/29 IR PEG  10/6 Trach collar  Significant Diagnostic Tests:  CT head/C-spine/Maxillofacial >> Small left frontal scalp hematoma without skull fracture. Fracture of the left lamina papyracea with herniation of the left medial rectus muscle into the ethmoid sinus. Small amount of retro bulbar stranding, likely hemorrhage.  Right lung apex consolidation concerning for pneumonia. TTE 9/18 >> LVEF 60-65%; no regional wall abnormalities; G1DD, normal RV EEG 9/20 >> diffuse encephalopathy MRI brain 9/24 >> Findings are highly suspicious for hypoxic/ischemic injury. Two punctate acute infarcts within the paramedian right frontal lobe are difficult to exclude. Medially displaced acute fracture of the left lamina papyracea. There is also an acute, displaced fracture of the left fovea ethmoidalis. Orbital fat and the medial rectus muscle extend into the left ethmoid sinus. Additionally, orbital fat extends from the left ethmoid sinus through the fracture defect in the fovea ethmoidalis  intracranially into the left anterior cranial fossa. The intracranial component of orbital fat measures 1 cm and exerts mild local mass effect upon the anteroinferior left frontal lobe.   Micro Data:  9/18 Sars-CoV-2>>negative 9/18 MRSA >> positive 9/27 CSF culture >> negative 9/27 UC >> negative 9/27 BC x 2 >> negative 9/28 BAL >> negative 10/2 Tracheal aspirate >>  10/2 UA >> negative  10/11 Tracheal aspirate >> moderate candida albicans 10/20 resp cx: neg 10/20 blood: 1 of 2 positive for MRSE  Antimicrobials:  none  Interim history/subjective:   No issues overnight. Awaiting LTACH bed.   Objective   Blood pressure (!) 116/59, pulse 92, temperature 98.5 F (36.9 C), temperature source Oral, resp. rate 20, height _0  (1.727 m), weight 126.9 kg, SpO2 97 %.    Vent Mode: PSV;CPAP FiO2 (%):  [30 %] 30 % Set Rate:  [22 bmp] 22 bmp Vt Set:  [380 mL] 380 mL PEEP:  [8 cmH20] 8 cmH20 Pressure Support:  [12 cmH20] 12 cmH20 Plateau Pressure:  [21 cmH20] 21 cmH20   Intake/Output Summary (Last 24 hours) at 04/06/2020 0846 Last data filed at 04/06/2020 0830 Gross per 24 hour  Intake 2106 ml  Output 1350 ml  Net 756 ml   Filed Weights   04/04/20 0533 04/05/20 0500 04/06/20 0344  Weight: 128.7 kg 128.8 kg 126.9 kg   Physical Exam: General: morbidly obese fm, trach in place, remains on vent,  HEENT: frontal head injury, healing, left eye looks better  Neck: trach in place, no significant secretions  Respiratory: CTAB, BL vented breaths  Cardiovascular: RRR, no mrg  GI: soft, nt nd  Extremities: dependent  edema  Neuro: She is awake to voice, opens eyes tracks, she will not follow commands for me this morning   CBC Latest Ref Rng & Units 04/06/2020 04/04/2020 04/01/2020  WBC 4.0 - 10.5 K/uL 11.7(H) 12.3(H) 11.8(H)  Hemoglobin 12.0 - 15.0 g/dL 8.5(L) 8.6(L) 8.0(L)  Hematocrit 36 - 46 % 27.3(L) 27.8(L) 25.8(L)  Platelets 150 - 400 K/uL 187 169 135(L)    CMP Latest Ref Rng  & Units 04/06/2020 04/04/2020 04/03/2020  Glucose 70 - 99 mg/dL 139(H) 126(H) 154(H)  BUN 8 - 23 mg/dL 83(H) 87(H) 92(H)  Creatinine 0.44 - 1.00 mg/dL 1.41(H) 1.49(H) 1.48(H)  Sodium 135 - 145 mmol/L 145 143 144  Potassium 3.5 - 5.1 mmol/L 5.2(H) 4.9 4.9  Chloride 98 - 111 mmol/L 111 107 108  CO2 22 - 32 mmol/L _0 Calcium 8.9 - 10.3 mg/dL 9.2 9.2 9.2  Total Protein 6.5 - 8.1 g/dL - - -  Total Bilirubin 0.3 - 1.2 mg/dL - - -  Alkaline Phos 38 - 126 U/L - - -  AST 15 - 41 U/L - - -  ALT 0 - 44 U/L - - -    Resolved Problem List:   Hypernatremia  Cardiac arrest with hypoxic and hypercarbic respiratory failure OHS Patient with prolonged downtime  > 45 minutes. Presumed PEA Anemia 2/2 chronic disease, critical illness, stable  Hb 9.3, s/p 2 unit RBC on 9/25 and 10/2  Plan Trend cbc Transfuse for hgb < 7 HFpEF, baseline HTN Echo Grade 1 diastolic dysfunction Plan Cont strict I&O Holding his home amlodipine, carvedilol, lasix and ace-I   Assessment & Plan:   Acute on chronic respiratory failure with hypoxia- now s/p trach Tracheobroncomalacia OHS/OSA - trach possibly in suboptimal position, getting bivona adjustable to see if any possibility she can be weaned from vent - it was felt with TC trials presently she occludes airway against posterior wall due to lack of rigidity in structure of trachea Plan: Awaiting trach exchange. We need to track down whether or not we have one here. I will speak with RT. This is usually a special order  Awaiting on a new trach is not a barrier for transfer/discharge to LTACH   Acute encephalopathy secondary to sedative medications -  Anoxic brain injury with anoxic myoclonus - improving - agree with holding all sedating meds   Fever: Unclear etiology - follow for any signs of infection   CKD - follow, BMP and UOP   Periorbital Fracture with Hyphema, stable Herniation of the left medial rectus muscle into the sinus with small  amount of retro bulbar stranding.  Ophthalmologist noted patient's EOM intact and no limitations of gaze & no signs of entrapment seen.   ENT recommends to not repair orbital injury at this time. Neurosurgery consulted 9/28, stating no indication for repair unless clear CSF leak which the patient has no symptoms of.  Plan:  Outpatient ophthalmology follow up  Hyperglycemia  - CBGS + levemir and SSI   Constipation - PRN bowel regimen   At Risk Malnutrition  PEG Status   Disposition  - Ar[facility] for [facility]  Best practice:  Diet: TF Pain/Anxiety/Delirium protocol (if indicated): hold VAP protocol (if indicated): yes  DVT prophylaxis: Heparin SQ/ SCDs GI prophylaxis: Protonix Glucose control: SSI & levemir BID  Mobility: PT Code Status: DNR Family Communication: pending Disposition: ICU pending vent liberation   Garner Nash, DO [place] [facility] 04/06/2020 8:46 AM

## 2020-04-06 NOTE — Plan of Care (Signed)
  Problem: Clinical Measurements: Goal: Ability to maintain clinical measurements within normal limits will improve Outcome: Progressing Goal: Will remain free from infection Outcome: Progressing Goal: Diagnostic test results will improve Outcome: Progressing Goal: Respiratory complications will improve Outcome: Progressing Goal: Cardiovascular complication will be avoided Outcome: Progressing   Problem: Nutrition: Goal: Adequate nutrition will be maintained Outcome: Progressing   Problem: Coping: Goal: Level of anxiety will decrease Outcome: Adequate for Discharge   Problem: Elimination: Goal: Will not experience complications related to bowel motility Outcome: Adequate for Discharge Goal: Will not experience complications related to urinary retention Outcome: Progressing   Problem: Pain Managment: Goal: General experience of comfort will improve Outcome: Progressing   Problem: Safety: Goal: Ability to remain free from injury will improve Outcome: Progressing   Problem: Skin Integrity: Goal: Risk for impaired skin integrity will decrease Outcome: Progressing   Problem: Activity: Goal: Ability to tolerate increased activity will improve Outcome: Adequate for Discharge   Problem: Respiratory: Goal: Ability to maintain a clear airway and adequate ventilation will improve Outcome: Progressing

## 2020-04-06 NOTE — Progress Notes (Signed)
[place] Progress Note Patient Name: Kimberly Howell DOB: [birth year] MRN: [id number]   Date of Service  04/06/2020  HPI/Events of Note  Patient with vaginal bleeding on SQ Heparin for DVT prophylaxis.  eICU Interventions  Heparin held pending re-evaluation by daytime PCCM attending in a.m.        Kerry Kass [name] 04/06/2020, 5:15 AM

## 2020-04-07 DIAGNOSIS — J9611 Chronic respiratory failure with hypoxia: Secondary | ICD-10-CM

## 2020-04-07 DIAGNOSIS — A419 Sepsis, unspecified organism: Secondary | ICD-10-CM | POA: Diagnosis not present

## 2020-04-07 DIAGNOSIS — R6521 Severe sepsis with septic shock: Secondary | ICD-10-CM | POA: Diagnosis not present

## 2020-04-07 DIAGNOSIS — Z9911 Dependence on respirator [ventilator] status: Secondary | ICD-10-CM | POA: Diagnosis not present

## 2020-04-07 LAB — CULTURE, BLOOD (ROUTINE X 2): Special Requests: ADEQUATE

## 2020-04-07 LAB — CBC
HCT: 27.9 % — ABNORMAL LOW (ref 36.0–46.0)
Hemoglobin: 8.9 g/dL — ABNORMAL LOW (ref 12.0–15.0)
MCH: 32.1 pg (ref 26.0–34.0)
MCHC: 31.9 g/dL (ref 30.0–36.0)
MCV: 100.7 fL — ABNORMAL HIGH (ref 80.0–100.0)
Platelets: 178 10*3/uL (ref 150–400)
RBC: 2.77 MIL/uL — ABNORMAL LOW (ref 3.87–5.11)
RDW: 17.1 % — ABNORMAL HIGH (ref 11.5–15.5)
WBC: 11.4 10*3/uL — ABNORMAL HIGH (ref 4.0–10.5)
nRBC: 0.4 % — ABNORMAL HIGH (ref 0.0–0.2)

## 2020-04-07 LAB — CULTURE, RESPIRATORY W GRAM STAIN: Special Requests: NORMAL

## 2020-04-07 LAB — GLUCOSE, CAPILLARY
Glucose-Capillary: 122 mg/dL — ABNORMAL HIGH (ref 70–99)
Glucose-Capillary: 124 mg/dL — ABNORMAL HIGH (ref 70–99)
Glucose-Capillary: 131 mg/dL — ABNORMAL HIGH (ref 70–99)
Glucose-Capillary: 132 mg/dL — ABNORMAL HIGH (ref 70–99)
Glucose-Capillary: 84 mg/dL (ref 70–99)
Glucose-Capillary: 99 mg/dL (ref 70–99)

## 2020-04-07 NOTE — Progress Notes (Signed)
NAME:  Kimberly Howell, MRN:  [id number], DOB:  March 26, 1946, LOS: 45 ADMISSION DATE:  03/03/2020, CONSULTATION DATE:  04/07/20 REFERRING MD:  EDP, CHIEF COMPLAINT:  Cardiac arrest   Brief History   74 year old female with PMH chronic respiratory failure with COPD on 3 L home O2 and CPAP nightly, stage IV CKD, hypertension, morbid obesity who had a cardiac arrest with >45 minutes CPR before ROSC then lost pulses three more times.  Intubated and PCCM consulted for admission  Past Medical History  Morbid obesity with BMI of 50.0-59.9, adult ([place]) Chronic respiratory failure (HCC) CKD (chronic kidney disease) stage 3, GFR 30-59 ml/min (HCC) Essential hypertension OSA on CPAP  Significant Hospital Events   9/18 Admit to PCCM 9/28 Trach  10/4 Started Depakote   Consults:  Ophthamology ENT Neurology Palliative  Procedures:  9/18 ETT >> 9/28 9/21 CVC 3L >> to be taken out 9/29 post PEG 9/27 LP  9/28 Tracheostomy  9/29 IR PEG  10/6 Trach collar  Significant Diagnostic Tests:  CT head/C-spine/Maxillofacial >> Small left frontal scalp hematoma without skull fracture. Fracture of the left lamina papyracea with herniation of the left medial rectus muscle into the ethmoid sinus. Small amount of retro bulbar stranding, likely hemorrhage.  Right lung apex consolidation concerning for pneumonia. TTE 9/18 >> LVEF 60-65%; no regional wall abnormalities; G1DD, normal RV EEG 9/20 >> diffuse encephalopathy MRI brain 9/24 >> Findings are highly suspicious for hypoxic/ischemic injury. Two punctate acute infarcts within the paramedian right frontal lobe are difficult to exclude. Medially displaced acute fracture of the left lamina papyracea. There is also an acute, displaced fracture of the left fovea ethmoidalis. Orbital fat and the medial rectus muscle extend into the left ethmoid sinus. Additionally, orbital fat extends from the left ethmoid sinus through the fracture defect in the fovea ethmoidalis  intracranially into the left anterior cranial fossa. The intracranial component of orbital fat measures 1 cm and exerts mild local mass effect upon the anteroinferior left frontal lobe.   Micro Data:  9/18 Sars-CoV-2>>negative 9/18 MRSA >> positive 9/27 CSF culture >> negative 9/27 UC >> negative 9/27 BC x 2 >> negative 9/28 BAL >> negative 10/2 Tracheal aspirate >>  10/2 UA >> negative  10/11 Tracheal aspirate >> moderate candida albicans 10/20 resp cx: neg 10/20 blood: 1 of 2 positive for MRSE  Antimicrobials:  none  Interim history/subjective:   Patient remains on full mechanical vent support.  I updated patient's family yesterday afternoon.  Objective   Blood pressure 131/67, pulse 95, temperature 99.3 F (37.4 C), temperature source Axillary, resp. rate (!) 27, height _0  (1.727 m), weight 126.7 kg, SpO2 100 %.    Vent Mode: PSV;CPAP FiO2 (%):  [30 %] 30 % Set Rate:  [22 bmp] 22 bmp Vt Set:  [380 mL] 380 mL PEEP:  [8 cmH20] 8 cmH20 Pressure Support:  [12 cmH20] 12 cmH20 Plateau Pressure:  [18 cmH20-20 cmH20] 18 cmH20   Intake/Output Summary (Last 24 hours) at 04/07/2020 0916 Last data filed at 04/07/2020 0700 Gross per 24 hour  Intake 1100 ml  Output 1575 ml  Net -475 ml   Filed Weights   04/05/20 0500 04/06/20 0344 04/07/20 0500  Weight: 128.8 kg 126.9 kg 126.7 kg   Physical Exam: General: Morbidly obese female, tracheostomy tube in place on mechanical ventilator HEENT: Frontal head injury, healing, left orbit is less swollen Neck: Trach in place no significant secretions Respiratory: Clear to auscultation bilaterally Cardiovascular: Regular rate rhythm, no MRG  GI: Soft, nontender nondistended Extremities: No significant edema Neuro: Awake alert tracking, unable to follow commands   CBC Latest Ref Rng & Units 04/07/2020 04/06/2020 04/04/2020  WBC 4.0 - 10.5 K/uL 11.4(H) 11.7(H) 12.3(H)  Hemoglobin 12.0 - 15.0 g/dL 8.9(L) 8.5(L) 8.6(L)  Hematocrit 36 -  46 % 27.9(L) 27.3(L) 27.8(L)  Platelets 150 - 400 K/uL 178 187 169    CMP Latest Ref Rng & Units 04/06/2020 04/04/2020 04/03/2020  Glucose 70 - 99 mg/dL 139(H) 126(H) 154(H)  BUN 8 - 23 mg/dL 83(H) 87(H) 92(H)  Creatinine 0.44 - 1.00 mg/dL 1.41(H) 1.49(H) 1.48(H)  Sodium 135 - 145 mmol/L 145 143 144  Potassium 3.5 - 5.1 mmol/L 5.2(H) 4.9 4.9  Chloride 98 - 111 mmol/L 111 107 108  CO2 22 - 32 mmol/L _0 Calcium 8.9 - 10.3 mg/dL 9.2 9.2 9.2  Total Protein 6.5 - 8.1 g/dL - - -  Total Bilirubin 0.3 - 1.2 mg/dL - - -  Alkaline Phos 38 - 126 U/L - - -  AST 15 - 41 U/L - - -  ALT 0 - 44 U/L - - -    Resolved Problem List:   Hypernatremia  Cardiac arrest with hypoxic and hypercarbic respiratory failure OHS Patient with prolonged downtime  > 45 minutes. Presumed PEA Anemia 2/2 chronic disease, critical illness, stable  Hb 9.3, s/p 2 unit RBC on 9/25 and 10/2  Plan Trend cbc Transfuse for hgb < 7 HFpEF, baseline HTN Echo Grade 1 diastolic dysfunction Plan Cont strict I&O Holding his home amlodipine, carvedilol, lasix and ace-I   Assessment & Plan:   Acute on chronic respiratory failure with hypoxia- now s/p trach Tracheobroncomalacia OHS/OSA - trach possibly in suboptimal position, getting a bivona adjustable to see if any possibility she can be weaned from vent - it was felt with TC trials presently she occludes airway against posterior wall due to lack of rigidity in structure of trachea Plan: Awaiting possible trach exchange however this is not a barrier for her discharge or transfer to [place]. Continue ventilator management at this time. Vent settings reviewed. Remains on pressure support ventilation this morning.  Good tidal volumes. Current ventilator settings: Vent Mode: PSV;CPAP FiO2 (%):  [30 %] 30 % Set Rate:  [22 bmp] 22 bmp Vt Set:  [380 mL] 380 mL PEEP:  [8 cmH20] 8 cmH20 Pressure Support:  [12 cmH20] 12 cmH20 Plateau Pressure:  [18 cmH20] 18  cmH20   Acute encephalopathy secondary to sedative medications -  Anoxic brain injury with anoxic myoclonus - improving -Continue to hold all sedating medications  Fever: Unclear etiology -Continue to follow for any sign of infection  CKD -Continue to monitor BMP and urine output as needed  Periorbital Fracture with Hyphema, stable Herniation of the left medial rectus muscle into the sinus with small amount of retro bulbar stranding.  Ophthalmologist noted patient's EOM intact and no limitations of gaze & no signs of entrapment seen.   ENT recommends to not repair orbital injury at this time. Neurosurgery consulted 9/28, stating no indication for repair unless clear CSF leak which the patient has no symptoms of.  Plan:  Outpatient ophthalmology follow-up regarding periorbital fracture  Hyperglycemia  -Continue CBGs plus Levemir plus SSI  Constipation -Continue as needed bowel regimen  At Risk Malnutrition  PEG Status   Disposition  - Arrange for [facility]  Best practice:  Diet: TF Pain/Anxiety/Delirium protocol (if indicated): hold VAP protocol (if indicated): yes  DVT prophylaxis: Heparin SQ/  SCDs GI prophylaxis: Protonix Glucose control: SSI & levemir BID  Mobility: PT Code Status: DNR Family Communication: pending Disposition: ICU pending vent liberation   Garner Nash, DO [place] [facility] 04/07/2020 9:16 AM

## 2020-04-08 DIAGNOSIS — A419 Sepsis, unspecified organism: Secondary | ICD-10-CM | POA: Diagnosis not present

## 2020-04-08 DIAGNOSIS — R6521 Severe sepsis with septic shock: Secondary | ICD-10-CM | POA: Diagnosis not present

## 2020-04-08 LAB — GLUCOSE, CAPILLARY
Glucose-Capillary: 166 mg/dL — ABNORMAL HIGH (ref 70–99)
Glucose-Capillary: 136 mg/dL — ABNORMAL HIGH (ref 70–99)
Glucose-Capillary: 132 mg/dL — ABNORMAL HIGH (ref 70–99)
Glucose-Capillary: 110 mg/dL — ABNORMAL HIGH (ref 70–99)
Glucose-Capillary: 120 mg/dL — ABNORMAL HIGH (ref 70–99)

## 2020-04-08 MED ORDER — METOPROLOL TARTRATE 5 MG/5ML IV SOLN
2.5000 mg | INTRAVENOUS | Status: DC | PRN
  Filled 2020-04-08 (×2): qty 5

## 2020-04-08 MED ORDER — METOPROLOL TARTRATE 25 MG/10 ML ORAL SUSPENSION
12.5000 mg | Freq: Three times a day (TID) | ORAL | Status: DC
  Administered 2020-04-08 – 2020-04-11 (×9): 12.5 mg
  Filled 2020-04-08 (×9): qty 5

## 2020-04-08 NOTE — Plan of Care (Signed)
  Problem: Clinical Measurements: Goal: Ability to maintain clinical measurements within normal limits will improve Outcome: Progressing Goal: Will remain free from infection Outcome: Progressing Goal: Diagnostic test results will improve Outcome: Progressing Goal: Respiratory complications will improve Outcome: Progressing Goal: Cardiovascular complication will be avoided Outcome: Progressing   Problem: Nutrition: Goal: Adequate nutrition will be maintained Outcome: Progressing   Problem: Coping: Goal: Level of anxiety will decrease Outcome: Progressing   Problem: Elimination: Goal: Will not experience complications related to bowel motility Outcome: Progressing Goal: Will not experience complications related to urinary retention Outcome: Progressing   Problem: Pain Managment: Goal: General experience of comfort will improve Outcome: Progressing   Problem: Safety: Goal: Ability to remain free from injury will improve Outcome: Progressing   Problem: Skin Integrity: Goal: Risk for impaired skin integrity will decrease Outcome: Progressing   Problem: Respiratory: Goal: Ability to maintain a clear airway and adequate ventilation will improve Outcome: Progressing

## 2020-04-08 NOTE — Progress Notes (Signed)
NAME:  Kimberly Howell, MRN:  [id number], DOB:  Oct 14, 1945, LOS: 66 ADMISSION DATE:  03/03/2020, CONSULTATION DATE:  04/08/20 REFERRING MD:  EDP, CHIEF COMPLAINT:  Cardiac arrest   Brief History   74 year old female with PMH chronic respiratory failure with COPD on 3 L home O2 and CPAP nightly, stage IV CKD, hypertension, morbid obesity who had a cardiac arrest with >45 minutes CPR before ROSC then lost pulses three more times.  Intubated and PCCM consulted for admission  Past Medical History  Morbid obesity with BMI of 50.0-59.9, adult ([place]) Chronic respiratory failure (HCC) CKD (chronic kidney disease) stage 3, GFR 30-59 ml/min (HCC) Essential hypertension OSA on CPAP  Significant Hospital Events   9/18 Admit to PCCM 9/28 Trach  10/4 Started Depakote   Consults:  Ophthamology ENT Neurology Palliative  Procedures:  9/18 ETT >> 9/28 9/21 CVC 3L >> to be taken out 9/29 post PEG 9/27 LP  9/28 Tracheostomy  9/29 IR PEG  10/6 Trach collar  Significant Diagnostic Tests:  CT head/C-spine/Maxillofacial >> Small left frontal scalp hematoma without skull fracture. Fracture of the left lamina papyracea with herniation of the left medial rectus muscle into the ethmoid sinus. Small amount of retro bulbar stranding, likely hemorrhage.  Right lung apex consolidation concerning for pneumonia. TTE 9/18 >> LVEF 60-65%; no regional wall abnormalities; G1DD, normal RV EEG 9/20 >> diffuse encephalopathy MRI brain 9/24 >> Findings are highly suspicious for hypoxic/ischemic injury. Two punctate acute infarcts within the paramedian right frontal lobe are difficult to exclude. Medially displaced acute fracture of the left lamina papyracea. There is also an acute, displaced fracture of the left fovea ethmoidalis. Orbital fat and the medial rectus muscle extend into the left ethmoid sinus. Additionally, orbital fat extends from the left ethmoid sinus through the fracture defect in the fovea ethmoidalis  intracranially into the left anterior cranial fossa. The intracranial component of orbital fat measures 1 cm and exerts mild local mass effect upon the anteroinferior left frontal lobe.   Micro Data:  9/18 Sars-CoV-2>>negative 9/18 MRSA >> positive 9/27 CSF culture >> negative 9/27 UC >> negative 9/27 BC x 2 >> negative 9/28 BAL >> negative 10/2 Tracheal aspirate >>  10/2 UA >> negative  10/11 Tracheal aspirate >> moderate candida albicans 10/20 resp cx: neg 10/20 blood: 1 of 2 positive for MRSE  Antimicrobials:  none  Interim history/subjective:   Patient remains on full mechanical vent support.  Tolerating pressure support trial this morning.  Mental status unchanged.  Objective   Blood pressure (!) 86/51, pulse 97, temperature 98.7 F (37.1 C), resp. rate 20, height _0  (1.727 m), weight 125.6 kg, SpO2 100 %.    Vent Mode: PSV FiO2 (%):  [30 %] 30 % Set Rate:  [22 bmp] 22 bmp Vt Set:  [380 mL] 380 mL PEEP:  [8 cmH20] 8 cmH20 Pressure Support:  [10 [id number] cmH20] 10 cmH20 Plateau Pressure:  [26 cmH20] 26 cmH20   Intake/Output Summary (Last 24 hours) at 04/08/2020 0814 Last data filed at 04/08/2020 0600 Gross per 24 hour  Intake 1120 ml  Output 1200 ml  Net -80 ml   Filed Weights   04/06/20 0344 04/07/20 0500 04/08/20 0441  Weight: 126.9 kg 126.7 kg 125.6 kg   Physical Exam: General: Morbidly obese female, on mechanical support HEENT: Frontal head injury and left orbital injury healing less swollen Neck: Tracheostomy tube in place no significant secretions connected to mechanical support Respiratory: Clear to auscultation bilaterally no crackles no  wheeze, bilateral ventilated breath sounds Cardiovascular: Regular rate rhythm, S1-S2 GI: Soft, nontender nondistended Extremities: No significant edema Neuro: Awake to voice, not following commands  CBC Latest Ref Rng & Units 04/07/2020 04/06/2020 04/04/2020  WBC 4.0 - 10.5 K/uL 11.4(H) 11.7(H) 12.3(H)    Hemoglobin 12.0 - 15.0 g/dL 8.9(L) 8.5(L) 8.6(L)  Hematocrit 36 - 46 % 27.9(L) 27.3(L) 27.8(L)  Platelets 150 - 400 K/uL 178 187 169    CMP Latest Ref Rng & Units 04/06/2020 04/04/2020 04/03/2020  Glucose 70 - 99 mg/dL 139(H) 126(H) 154(H)  BUN 8 - 23 mg/dL 83(H) 87(H) 92(H)  Creatinine 0.44 - 1.00 mg/dL 1.41(H) 1.49(H) 1.48(H)  Sodium 135 - 145 mmol/L 145 143 144  Potassium 3.5 - 5.1 mmol/L 5.2(H) 4.9 4.9  Chloride 98 - 111 mmol/L 111 107 108  CO2 22 - 32 mmol/L _0 Calcium 8.9 - 10.3 mg/dL 9.2 9.2 9.2  Total Protein 6.5 - 8.1 g/dL - - -  Total Bilirubin 0.3 - 1.2 mg/dL - - -  Alkaline Phos 38 - 126 U/L - - -  AST 15 - 41 U/L - - -  ALT 0 - 44 U/L - - -    Resolved Problem List:   Hypernatremia  Cardiac arrest with hypoxic and hypercarbic respiratory failure OHS Patient with prolonged downtime  > 45 minutes. Presumed PEA Anemia 2/2 chronic disease, critical illness, stable  Hb 9.3, s/p 2 unit RBC on 9/25 and 10/2  Plan Trend cbc Transfuse for hgb < 7 HFpEF, baseline HTN Echo Grade 1 diastolic dysfunction Plan Cont strict I&O Holding his home amlodipine, carvedilol, lasix and ace-I   Assessment & Plan:   Acute on chronic respiratory failure with hypoxia- now s/p trach Tracheobroncomalacia OHS/OSA - trach possibly in suboptimal position, getting a bivona adjustable to see if any possibility she can be weaned from vent - it was felt with TC trials presently she occludes airway against posterior wall due to lack of rigidity in structure of trachea Plan: I spoke with respiratory.  Waiting to see if we can get a Bivona trach. Otherwise doing well on pressure support. Vent settings reviewed Patient remains well with good tidal volumes. Current ventilator settings: Vent Mode: PSV FiO2 (%):  [30 %] 30 % Set Rate:  [22 bmp] 22 bmp Vt Set:  [380 mL] 380 mL PEEP:  [8 cmH20] 8 cmH20 Pressure Support:  [10 [id number] cmH20] 10 cmH20 Plateau Pressure:  [26 cmH20] 26  cmH20  Acute encephalopathy secondary to sedative medications -  Anoxic brain injury with anoxic myoclonus - improving -Continue to hold all sedating medications  Fever: Unclear etiology -Continue to observe for any sign of infection  CKD -Continue to monitor BMP and urine output as needed  Periorbital Fracture with Hyphema, stable Herniation of the left medial rectus muscle into the sinus with small amount of retro bulbar stranding.  Ophthalmologist noted patient's EOM intact and no limitations of gaze & no signs of entrapment seen.   ENT recommends to not repair orbital injury at this time. Neurosurgery consulted 9/28, stating no indication for repair unless clear CSF leak which the patient has no symptoms of.  Plan:  Outpatient ophthalmology follow-up  Hyperglycemia  -Continue CBGs plus Levemir plus SSI for glucose control  Constipation -As needed bowel regimen  At Risk Malnutrition  PEG Status   Disposition  -LTAC placement.  Best practice:  Diet: TF Pain/Anxiety/Delirium protocol (if indicated): hold VAP protocol (if indicated): yes  DVT prophylaxis: Heparin SQ/  SCDs GI prophylaxis: Protonix Glucose control: SSI & levemir BID  Mobility: PT Code Status: DNR Family Communication: pending Disposition: ICU, long-term vent wean   Garner Nash, DO [facility] 04/08/2020 8:14 AM

## 2020-04-09 DIAGNOSIS — R6521 Severe sepsis with septic shock: Secondary | ICD-10-CM | POA: Diagnosis not present

## 2020-04-09 DIAGNOSIS — J449 Chronic obstructive pulmonary disease, unspecified: Secondary | ICD-10-CM | POA: Diagnosis not present

## 2020-04-09 DIAGNOSIS — E1122 Type 2 diabetes mellitus with diabetic chronic kidney disease: Secondary | ICD-10-CM | POA: Diagnosis not present

## 2020-04-09 DIAGNOSIS — E7800 Pure hypercholesterolemia, unspecified: Secondary | ICD-10-CM | POA: Diagnosis not present

## 2020-04-09 DIAGNOSIS — N183 Chronic kidney disease, stage 3 unspecified: Secondary | ICD-10-CM | POA: Diagnosis not present

## 2020-04-09 DIAGNOSIS — I10 Essential (primary) hypertension: Secondary | ICD-10-CM | POA: Diagnosis not present

## 2020-04-09 DIAGNOSIS — G8929 Other chronic pain: Secondary | ICD-10-CM | POA: Diagnosis not present

## 2020-04-09 DIAGNOSIS — A419 Sepsis, unspecified organism: Secondary | ICD-10-CM | POA: Diagnosis not present

## 2020-04-09 LAB — CBC
HCT: 34.9 % — ABNORMAL LOW (ref 36.0–46.0)
Hemoglobin: 10.8 g/dL — ABNORMAL LOW (ref 12.0–15.0)
MCH: 31 pg (ref 26.0–34.0)
MCHC: 30.9 g/dL (ref 30.0–36.0)
MCV: 100.3 fL — ABNORMAL HIGH (ref 80.0–100.0)
Platelets: 154 10*3/uL (ref 150–400)
RBC: 3.48 MIL/uL — ABNORMAL LOW (ref 3.87–5.11)
RDW: 17 % — ABNORMAL HIGH (ref 11.5–15.5)
WBC: 11.4 10*3/uL — ABNORMAL HIGH (ref 4.0–10.5)
nRBC: 0.3 % — ABNORMAL HIGH (ref 0.0–0.2)

## 2020-04-09 LAB — BASIC METABOLIC PANEL
Anion gap: 13 (ref 5–15)
BUN: 84 mg/dL — ABNORMAL HIGH (ref 8–23)
CO2: 18 mmol/L — ABNORMAL LOW (ref 22–32)
Calcium: 9.1 mg/dL (ref 8.9–10.3)
Chloride: 112 mmol/L — ABNORMAL HIGH (ref 98–111)
Creatinine, Ser: 1.53 mg/dL — ABNORMAL HIGH (ref 0.44–1.00)
GFR, Estimated: 36 mL/min — ABNORMAL LOW (ref >60)
Glucose, Bld: 144 mg/dL — ABNORMAL HIGH (ref 70–99)
Potassium: 5.3 mmol/L — ABNORMAL HIGH (ref 3.5–5.1)
Sodium: 143 mmol/L (ref 135–145)

## 2020-04-09 LAB — CULTURE, BLOOD (ROUTINE X 2)
Culture: NO GROWTH
Special Requests: ADEQUATE

## 2020-04-09 LAB — GLUCOSE, CAPILLARY
Glucose-Capillary: 113 mg/dL — ABNORMAL HIGH (ref 70–99)
Glucose-Capillary: 121 mg/dL — ABNORMAL HIGH (ref 70–99)
Glucose-Capillary: 136 mg/dL — ABNORMAL HIGH (ref 70–99)
Glucose-Capillary: 137 mg/dL — ABNORMAL HIGH (ref 70–99)
Glucose-Capillary: 142 mg/dL — ABNORMAL HIGH (ref 70–99)
Glucose-Capillary: 147 mg/dL — ABNORMAL HIGH (ref 70–99)
Glucose-Capillary: 157 mg/dL — ABNORMAL HIGH (ref 70–99)

## 2020-04-09 LAB — SARS CORONAVIRUS 2 BY RT PCR (HOSPITAL ORDER, PERFORMED IN ~~LOC~~ HOSPITAL LAB): SARS Coronavirus 2: NEGATIVE

## 2020-04-09 NOTE — Plan of Care (Signed)
  Problem: Clinical Measurements: Goal: Ability to maintain clinical measurements within normal limits will improve Outcome: Progressing Goal: Will remain free from infection Outcome: Progressing Goal: Diagnostic test results will improve Outcome: Progressing Goal: Respiratory complications will improve Outcome: Progressing Goal: Cardiovascular complication will be avoided Outcome: Progressing   Problem: Nutrition: Goal: Adequate nutrition will be maintained Outcome: Progressing   Problem: Elimination: Goal: Will not experience complications related to bowel motility Outcome: Progressing Goal: Will not experience complications related to urinary retention Outcome: Progressing   Problem: Pain Managment: Goal: General experience of comfort will improve Outcome: Progressing   Problem: Safety: Goal: Ability to remain free from injury will improve Outcome: Progressing   Problem: Skin Integrity: Goal: Risk for impaired skin integrity will decrease Outcome: Progressing   Problem: Respiratory: Goal: Ability to maintain a clear airway and adequate ventilation will improve Outcome: Progressing   

## 2020-04-09 NOTE — Progress Notes (Signed)
Pt placed back on full support due to increased wob, low Ve. Inner canula changed. Pt ventilating much better now.

## 2020-04-09 NOTE — Progress Notes (Addendum)
NAME:  Kimberly Howell, MRN:  [id number], DOB:  [birth year], LOS: 41 ADMISSION DATE:  03/03/2020, CONSULTATION DATE:  04/09/20 REFERRING MD:  EDP, CHIEF COMPLAINT:  Cardiac arrest   Brief History   74 year old female with PMH chronic respiratory failure with COPD on 3 L home O2 and CPAP nightly, stage IV CKD, hypertension, morbid obesity who had a cardiac arrest with >45 minutes CPR before ROSC then lost pulses three more times.  Intubated and PCCM consulted for admission  Past Medical History  Morbid obesity with BMI of 50.0-59.9, adult ([place]) Chronic respiratory failure (HCC) CKD (chronic kidney disease) stage 3, GFR 30-59 ml/min (HCC) Essential hypertension OSA on CPAP  Significant Hospital Events   9/18 Admit to PCCM 9/28 Trach  10/4 Started Depakote   Consults:  Ophthamology ENT Neurology Palliative  Procedures:  9/18 ETT >> 9/28 9/21 CVC 3L >> to be taken out 9/29 post PEG 9/27 LP  9/28 Tracheostomy  9/29 IR PEG  10/6 Trach collar  Significant Diagnostic Tests:  CT head/C-spine/Maxillofacial >> Small left frontal scalp hematoma without skull fracture. Fracture of the left lamina papyracea with herniation of the left medial rectus muscle into the ethmoid sinus. Small amount of retro bulbar stranding, likely hemorrhage.  Right lung apex consolidation concerning for pneumonia. TTE 9/18 >> LVEF 60-65%; no regional wall abnormalities; G1DD, normal RV EEG 9/20 >> diffuse encephalopathy MRI brain 9/24 >> Findings are highly suspicious for hypoxic/ischemic injury. Two punctate acute infarcts within the paramedian right frontal lobe are difficult to exclude. Medially displaced acute fracture of the left lamina papyracea. There is also an acute, displaced fracture of the left fovea ethmoidalis. Orbital fat and the medial rectus muscle extend into the left ethmoid sinus. Additionally, orbital fat extends from the left ethmoid sinus through the fracture defect in the fovea ethmoidalis  intracranially into the left anterior cranial fossa. The intracranial component of orbital fat measures 1 cm and exerts mild local mass effect upon the anteroinferior left frontal lobe.   Micro Data:  9/18 Sars-CoV-2>>negative 9/18 MRSA >> positive 9/27 CSF culture >> negative 9/27 UC >> negative 9/27 BC x 2 >> negative 9/28 BAL >> negative 10/2 Tracheal aspirate >>  10/2 UA >> negative  10/11 Tracheal aspirate >> moderate candida albicans 10/20 resp cx: neg 10/20 blood: 1 of 2 positive for MRSE  Antimicrobials:  none  Interim history/subjective:   Patient remains on full mechanical vent support.  Mental status remains poor.  Open eyes to voice.  Objective   Blood pressure (!) 142/64, pulse 97, temperature 99.7 F (37.6 C), temperature source Oral, resp. rate (!) 26, height _0  (1.727 m), weight 124.9 kg, SpO2 100 %.    Vent Mode: PRVC FiO2 (%):  [30 %] 30 % Set Rate:  [22 bmp] 22 bmp Vt Set:  [380 mL] 380 mL PEEP:  [8 cmH20-10 cmH20] 8 cmH20 Pressure Support:  [10 cmH20] 10 cmH20 Plateau Pressure:  [15 cmH20] 15 cmH20   Intake/Output Summary (Last 24 hours) at 04/09/2020 0945 Last data filed at 04/09/2020 0900 Gross per 24 hour  Intake 1581 ml  Output 1900 ml  Net -319 ml   Filed Weights   04/07/20 0500 04/08/20 0441 04/09/20 0500  Weight: 126.7 kg 125.6 kg 124.9 kg   Physical Exam: General: Morbidly obese female on mechanical life support HEENT: Frontal head injury and left orbital injury, less swollen healing Neck: Tracheostomy tube in place no significant secretions connected to mechanical support Respiratory: Clear to auscultation  bilaterally no crackles no wheeze bilateral mechanically ventilated breath sounds Cardiovascular: Regular rate rhythm, S1-S2 GI: Soft, nontender nondistended Extremities: no edema  Neuro: opens eyes to voice   CBC Latest Ref Rng & Units 04/07/2020 04/06/2020 04/04/2020  WBC 4.0 - 10.5 K/uL 11.4(H) 11.7(H) 12.3(H)  Hemoglobin  12.0 - 15.0 g/dL 8.9(L) 8.5(L) 8.6(L)  Hematocrit 36 - 46 % 27.9(L) 27.3(L) 27.8(L)  Platelets 150 - 400 K/uL 178 187 169    CMP Latest Ref Rng & Units 04/06/2020 04/04/2020 04/03/2020  Glucose 70 - 99 mg/dL 139(H) 126(H) 154(H)  BUN 8 - 23 mg/dL 83(H) 87(H) 92(H)  Creatinine 0.44 - 1.00 mg/dL 1.41(H) 1.49(H) 1.48(H)  Sodium 135 - 145 mmol/L 145 143 144  Potassium 3.5 - 5.1 mmol/L 5.2(H) 4.9 4.9  Chloride 98 - 111 mmol/L 111 107 108  CO2 22 - 32 mmol/L _0 Calcium 8.9 - 10.3 mg/dL 9.2 9.2 9.2  Total Protein 6.5 - 8.1 g/dL - - -  Total Bilirubin 0.3 - 1.2 mg/dL - - -  Alkaline Phos 38 - 126 U/L - - -  AST 15 - 41 U/L - - -  ALT 0 - 44 U/L - - -    Resolved Problem List:   Hypernatremia  Cardiac arrest with hypoxic and hypercarbic respiratory failure OHS Patient with prolonged downtime  > 45 minutes. Presumed PEA Anemia 2/2 chronic disease, critical illness, stable  Hb 9.3, s/p 2 unit RBC on 9/25 and 10/2  Plan Trend cbc Transfuse for hgb < 7 HFpEF, baseline HTN Echo Grade 1 diastolic dysfunction Plan Cont strict I&O Holding his home amlodipine, carvedilol, lasix and ace-I   Assessment & Plan:   Acute on chronic respiratory failure with hypoxia- now s/p trach Tracheobroncomalacia OHS/OSA - trach possibly in suboptimal position, getting a bivona adjustable to see if any possibility she can be weaned from vent - it was felt with TC trials presently she occludes airway against posterior wall due to lack of rigidity in structure of trachea Plan: Awaiting LTAC placement. Vent settings reviewed. Continue pressure support ventilation at this time. Vent settings: Vent Mode: PRVC FiO2 (%):  [30 %] 30 % Set Rate:  [22 bmp] 22 bmp Vt Set:  [380 mL] 380 mL PEEP:  [8 cmH20-10 cmH20] 8 cmH20 Pressure Support:  [10 cmH20] 10 cmH20 Plateau Pressure:  [15 cmH20] 15 cmH20 Setting reviewed   - Patient will need prolonged vent weaning and is appropriate for VENT/SNF  placement.  - Awaiting VENT/SNF approval   Acute encephalopathy secondary to sedative medications -  Anoxic brain injury with anoxic myoclonus - improving -Holding sedating meds  Fever: Unclear etiology -Observe for any sign of infection  CKD Continue to follow urine output and BMP  Periorbital Fracture with Hyphema, stable Herniation of the left medial rectus muscle into the sinus with small amount of retro bulbar stranding.  Ophthalmologist noted patient's EOM intact and no limitations of gaze & no signs of entrapment seen.   ENT recommends to not repair orbital injury at this time. Neurosurgery consulted 9/28, stating no indication for repair unless clear CSF leak which the patient has no symptoms of.  Plan:  Outpatient ophthalmology follow-up  Hyperglycemia  -Continue CBGs plus Levemir plus SSI for glucose control  Constipation -As needed bowel regimen  At Risk Malnutrition  PEG Status   Disposition pending LTAC placement  Best practice:  Diet: TF Pain/Anxiety/Delirium protocol (if indicated): hold VAP protocol (if indicated): yes  DVT prophylaxis: Heparin SQ/  SCDs GI prophylaxis: Protonix Glucose control: SSI & levemir BID  Mobility: PT Code Status: DNR Family Communication: we will update family  Disposition: ICU, long-term vent wean   Garner Nash, DO [facility] 04/09/2020 9:45 AM

## 2020-04-09 NOTE — Progress Notes (Signed)
Physical Therapy Treatment Patient Details Name: Kimberly Howell MRN: [id number] DOB: October 13, 1945 Today's Date: 04/09/2020    History of Present Illness 74 yo admitted with cardiac arrest and fall on 9/18 s/p CPR with resultant left orbit fx. Intubated 9/18, trach 9/ 28, PEG 9/29. PMhx: morbid obesity, HTN, respiratory failure on 3L, CKD stage 4    PT Comments    Pt admitted with above diagnosis. Pt needed total assist of 2 to sit EOB for 6 minutes.  Did not appear to follow commands. Trial of PT for another week to determine appropriateness.   Pt currently with functional limitations due to balance and endurance deficits. Pt will benefit from skilled PT to increase their independence and safety with mobility to allow discharge to the venue listed below.     Follow Up Recommendations  SNF;Supervision/Assistance - 24 hour;LTACH (Vent SNF vs LTACH)     Equipment Recommendations  Other (comment) (TBA)    Recommendations for Other Services       Precautions / Restrictions Precautions Precautions: Fall Precaution Comments: trach, PEG, vent Restrictions Weight Bearing Restrictions: No    Mobility  Bed Mobility Overal bed mobility: Needs Assistance Bed Mobility: Rolling;Sidelying to Sit;Sit to Supine Rolling: +2 for physical assistance;Total assist Sidelying to sit: Total assist;+2 for physical assistance;HOB elevated   Sit to supine: Total assist;+2 for physical assistance   General bed mobility comments: Needs total assist due to pt unable to assist much due to weakness and slow to follow commands.   Transfers                 General transfer comment: not attempted  Ambulation/Gait                 Stairs             Wheelchair Mobility    Modified Rankin (Stroke Patients Only)       Balance Overall balance assessment: Needs assistance Sitting-balance support: Bilateral upper extremity supported;No upper extremity supported;Feet  supported Sitting balance-Leahy Scale: Zero Sitting balance - Comments: Pt on EOB x 6 minutes with total to max assist.  Pt most of the time needing total assist to sit EOB.  Did not follow commands to move UE or LEs.   Postural control: Posterior lean                                  Cognition Arousal/Alertness: Awake/alert Behavior During Therapy: Flat affect Overall Cognitive Status: Impaired/Different from baseline Area of Impairment: Orientation;Attention;Memory;Following commands                 Orientation Level: Disoriented to;Place;Time;Situation Current Attention Level: Focused Memory: Decreased short-term memory Following Commands: Follows one step commands with increased time       General Comments: blinked to threat and grimaced to pain with ROM, visually tracking with delay with auditory cues.       Exercises General Exercises - Upper Extremity Shoulder Flexion: Both;Supine;AAROM;5 reps Shoulder Extension: AAROM;Both;5 reps;Supine General Exercises - Lower Extremity Ankle Circles/Pumps: AAROM;Both;5 reps;Supine Long Arc Quad: AAROM;Both;5 reps;Seated Heel Slides: PROM;Both;Supine;5 reps Hip ABduction/ADduction: Both;PROM;10 reps;Supine    General Comments        Pertinent Vitals/Pain Pain Assessment: Faces Faces Pain Scale: Hurts even more Pain Location: B shoulders and bil knee with ROM Pain Descriptors / Indicators: Grimacing;Guarding;Aching Pain Intervention(s): Limited activity within patient's tolerance;Monitored during session;Repositioned    Home Living  Prior Function            PT Goals (current goals can now be found in the care plan section) Acute Rehab PT Goals Patient Stated Goal: unable to state Progress towards PT goals: Not progressing toward goals - comment (Not following commands well and poor mobility)    Frequency    Min 2X/week      PT Plan Current plan remains  appropriate    Co-evaluation              AM-PAC PT "6 Clicks" Mobility   Outcome Measure  Help needed turning from your back to your side while in a flat bed without using bedrails?: Total Help needed moving from lying on your back to sitting on the side of a flat bed without using bedrails?: Total Help needed moving to and from a bed to a chair (including a wheelchair)?: Total Help needed standing up from a chair using your arms (e.g., wheelchair or bedside chair)?: Total Help needed to walk in hospital room?: Total Help needed climbing 3-5 steps with a railing? : Total 6 Click Score: 6    End of Session Equipment Utilized During Treatment: Gait belt;Other (comment) (vent 30% FiO2, PEEP 8) Activity Tolerance: Patient limited by fatigue;Patient limited by pain Patient left: in bed;with call bell/phone within reach;with bed alarm set;with SCD's reapplied Nurse Communication: Mobility status;Need for lift equipment PT Visit Diagnosis: Other abnormalities of gait and mobility (R26.89);Muscle weakness (generalized) (M62.81);Other symptoms and signs involving the nervous system (R29.898)     Time: 8466-5993 PT Time Calculation (min) (ACUTE ONLY): 17 min  Charges:  $Therapeutic Activity: 8-22 mins                     Aryani Daffern W,PT Acute Rehabilitation Services Pager:  [phone]  Office:  [place] 04/09/2020, 2:37 PM

## 2020-04-09 NOTE — Progress Notes (Signed)
Nutrition Follow-up  DOCUMENTATION CODES:   Morbid obesity  INTERVENTION:   Continue tube feedsvia PEG: - Vital AF 1.2 @ 60 ml/hr (1440 ml/day) - ProSource TF 45 ml daily - Free water per CCM, currently 100 ml q 4 hours  Tube feeding regimen provides1768kcal, 119grams of protein, and 1131m of H2O.  Total free water with flushes: 1768 ml  NUTRITION DIAGNOSIS:   Inadequate oral intake related to acute illness as evidenced by NPO status.  Ongoing  GOAL:   Patient will meet greater than or equal to 90% of their needs  Met via TF  MONITOR:   Vent status, TF tolerance, Labs, Weight trends  REASON FOR ASSESSMENT:   Consult Enteral/tube feeding initiation and management  ASSESSMENT:   74yo female admitted post cardiac arrest requiring 45-50 minutes of CPR before ROSC achieved followed by loss of pulse 3 more times; pt intubated and sedated; also with periorbital fracture, AKI.  PMH includes COPD on home oxygen, stage IV CKD, HTN, morbid obesity  9/28 - trach 9/29 - PEG  Discussed pt with RN and during ICU rounds. Pt tolerating TF without issue. [facility] Insurance authorization pending.  Current TF: Vital AF 1.2 @ 60 ml/hr, ProSource TF 45 ml daily, free water 100 ml q 4 hours  Admit weight: 140.8 kg Current weight: 124.9 kg  Pt with +1 pitting edema to BUE and non-pitting edema to BLE.  Patient remains on ventilator support via trach. MV: 11.7 L/min Temp (24hrs), Avg:99.5 F (37.5 C), Min:97.8 F (36.6 C), Max:100.4 F (38 C)  Medications reviewed and include: SSI q 4 hours, Novolog 6 units q 4 hours, levemir 18 units daily, protonix  Labs reviewed: potassium 5.3, BUN 84, creatinine 1.53 CBG's: 110-157 x 24 hours  UOP: 1900 ml x 24 hours I/O's: +8.2 L since admit  Diet Order:   Diet Order    None      EDUCATION NEEDS:   Not appropriate for education at this time  Skin:  Skin Assessment: Skin  Integrity Issues: Other: skin tear right leg  Last BM:  04/09/20 small type 6  Height:   Ht Readings from Last 1 Encounters:  03/25/20 '5\' 8"'  (1.727 m)    Weight:   Wt Readings from Last 1 Encounters:  04/09/20 124.9 kg    Ideal Body Weight:  54.5 kg  BMI:  Body mass index is 41.87 kg/m.  Estimated Nutritional Needs:   Kcal:  1700-1900  Protein:  110-135 grams  Fluid:  >/= 1.5 L    KGaynell Face MS, RD, LDN Inpatient Clinical Dietitian Please see AMiON for contact information.

## 2020-04-09 NOTE — TOC Progression Note (Addendum)
Transition of Care [facility]) - Progression Note    Patient Details  Name: Kimberly Howell MRN: [id number] Date of Birth: 1945/12/01  Transition of Care [facility]) CM/SW [place], [place] Phone Number: 04/09/2020, 12:20 PM  Clinical Narrative:     CSW spoke with Lenna Sciara at [facility] who confirmed bed offer for patient. Melissa requested for CSW to request a covid for patient. CSW requested a covid for patient. Melissa requested that [facility] fax over updated clinicals for them to start insurance authorization for patient. CSW faxed over clinicals to [facility] to start insurance authorization.  Patient has Vent/SNF bed at [facility]. Insurance authorization pending. Covid requested.  CSW will continue to follow.   Expected Discharge Plan: Skilled Nursing Facility Barriers to Discharge: Continued Medical Work up  Expected Discharge Plan and Services Expected Discharge Plan: [place]   Discharge Planning Services: CM Consult Post Acute Care Choice: Long Term Acute Care (LTAC) Living arrangements for the past 2 months: Single Family Home                                       Social Determinants of Health (SDOH) Interventions    Readmission Risk Interventions Readmission Risk Prevention Plan 03/14/2020  Transportation Screening Complete  PCP or Specialist Appt within 5-7 Days Complete  Home Care Screening Complete  Medication Review (RN CM) Complete

## 2020-04-09 NOTE — Discharge Summary (Addendum)
Physician Discharge Summary         Patient ID: Kimberly Howell MRN: [id number] DOB/AGE: 1945/07/14 74 y.o.  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  Her son lives with her and states she had resumed her CPAP last night, he was up in the night with her around 1 AM and she seemed fine.  Several hours later he heard the sound of her falling in the bathroom. He found her face down and unresponsive and did not think she was breathing so called 911 and started chest compressions.  When EMS arrived CPR performed for /45-50 minutes before ROSC achieved.  Per report, patient lost pulses 3 more times and ended to the ED with CPR in progress.  CT head negative for intracranial bleed, L lamina papyracea fracture with herniation of the L medial rectus muscle.  Labs significant for creatinine 2.08, elevated LFTs AST 392, ALT 247.  She has been unresponsive in the ED with sedation. PCCM consulted for admission.   She was treated for a right upper lobe lung pneumonia, sepsis, and ongoing supportive care along with targeted temperature management at 36 degrees.   EEG 9/19 showed generalized non specific cerebral dysfunction with no seizure predisposition.  MRI obtained 9/24 which showed evidence of subtle hypoxic/ anoxic changes and several punctate areas of restricted diffusion likely related to  cardiac arrest.  Neurology was consulted for assistance in neuro prognostication, who felt patient would have a prolonged recovery with significant residual deficits.  She initially did not tolerate weaning of sedation becoming dyssynchronous, desaturated, and bradycardic.  Slowly, sedation was weaned with patient having purposeful movement and following intermittent commands but unable to be weaned from mechanical ventilation. Palliative care was involved and multiple family discussions held.  Family decided to continue full aggressive medical care however changed her code status to DNR on 9/27. Therefore, she underwent tracheostomy on 9/28 and PEG placement 9/29. ENT and ophthalmology were consulted given her left orbital fracture with hyphema with recommendations for outpatient follow-up if any ocular or clinical problems arise.  Neurosurgery additionally consulted with no recommendations for repair unless CSF leak was present in which patient had no symptoms of.  Developed worsening facial, body, and extremity twitching on 10/4; EEG unchanged, most likely consistent with myoclonus in which neurology increased her Depakote.  Subsequently, it was stopped on 10/14 given her poor mental status and started on trial of modafinil.  Patient with recurrent fevers, treatment with empiric antibiotics, and pan-cultured including LP.  No obvious source of fevers identified.  She started trach collar trials during the day on 10/6, however still mostly requiring PSV during the day and requiring full MV support at night.  No significant improvements in her mental status, still intermittent, and at best tracks and follows simple commands of sticking out her tongue when alert.  Noted she does better when her family is present.  There is some question that her trach is the suboptimal  positioning.  A bivona trach has been ordered however is on back order.  She is to be transferred to [facility] for further ongoing care and vent weaning.     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  Ophthalmologist noted patient's EOM intact and no limitations of gaze & no signs of entrapment seen.  ENT recommends to not repair orbital injury at this time. Neurosurgery consulted 9/28, stating no indication for repair unless clear CSF leak which the patient has no symptoms of.  P: Outpatient ophthalmology follow-up  Hyperglycemia P:  SSI moderate TF coverage, 6 units q 4hr Levemir 18 units daily   At risk for malnutrition  Physical debility  P:  Continue EN with free water flushes Ongoing PT Ongoing bowel regimen  Significant Hospital tests/ studies  CT head/C-spine/Maxillofacial 03/03/20 >> Small left frontal scalp hematoma without skull fracture. Fracture of the left lamina papyracea with herniation of the left medial rectus muscle into the ethmoid sinus. Small amount of retro bulbar stranding, likely hemorrhage.  Right lung apex consolidation concerning for pneumonia. TTE 03/03/20 >> LVEF 60-65%; no regional wall abnormalities; G1DD, normal RV EEG 03/05/20 >> diffuse encephalopathy MRI brain 03/09/20 >> Findings are highly suspicious for hypoxic/ischemic injury. Two punctate acute infarcts within the paramedian right frontal lobe are difficult to exclude. Medially displaced acute fracture of the left  lamina papyracea. There is also an acute, displaced fracture of the left fovea ethmoidalis. Orbital fat and the medial rectus muscle extend into the left ethmoid sinus. Additionally, orbital fat extends from the left ethmoid sinus through the fracture defect in the fovea ethmoidalis intracranially into the left anterior cranial fossa. The intracranial component of orbital fat measures 1 cm and exerts mild local mass effect upon the anteroinferior left frontal lobe.   Procedures   9/18 ETT >> 9/28 9/21 L IJ TL CVC >>9/29 9/27 LP  9/28 Trach (Shiley 6 prox XLT) 9/29 Gtube (IR)  Culture data  9/18 SARS 2 >> Negative  9/18 MRSA PCR >> Positive  9/27 UC >> negative  9/28 Trach asp >> Negative 9/27 CSF cx  >> Negative  9/27 BCx 2 >> Negative  10/2 trach asp >> rare candida albicans 10/11 trach asp >> moderate candida albicans 10/20 BC x2 >>  1/2 MRSE 10/22 MRSA PCR >> positive 10/20 trach asp >> rare stap epidermidis/ rare candida albicans  1020 Blood culture >> Negative  10/25 SARS 2>> Negative  Antimicrobials:  Unasyn 9/19 >> 9/25 Cefazolin 9/29 Cefepime 10/1 >>10/4 vancomycin  9/27; 10/1 >>10/3 Ceftriaxone 9/27 >>9/29; 10/4 >>10/10  Consults  Trauma 9/18 Neurology 9/25 Palliative Care 9/26 Ophthalmology 9/28 Neurosurgery 9/29 ENT 9/28 IR 9/29   Discharge Exam: BP (!) 112/59   Pulse 94   Temp 99.6 F (37.6 C) (Axillary)   Resp (!) 21   Ht 5\' 8"  (1.727 m)  Wt 129.2 kg   LMP  (LMP Unknown)   SpO2 100%   BMI 43.31 kg/m   General: Chronically ill appearing elderly female lying in bed on mechanical ventilation through trach in NAD HEENT: 6 cuffed proximal XLT shiley trach midline, MM pink/moist, PERRL,  Neuro: Eyes open spontaneously, trachs movement, unable to follow commands, withdrawals lower extremities to pain  CV: s1s2 regular rate and rhythm, no murmur, rubs, or gallops,  PULM:  Clear to ascultation, no added breath sounds, no increased work of breathing,  tolerating vent well GI: soft, bowel sounds active in all 4 quadrants, non-tender, non-distended, tolerating TF, PEG tube in place  Extremities: warm/dry, no edema  Skin: no rashes or lesions  Labs at discharge   Lab Results  Component Value Date   CREATININE 1.53 (H) 04/09/2020   BUN 84 (H) 04/09/2020   NA 143 04/09/2020   K 5.3 (H) 04/09/2020   CL 112 (H) 04/09/2020   CO2 18 (L) 04/09/2020   Lab Results  Component Value Date   WBC 13.1 (H) 04/10/2020   HGB 7.6 (L) 04/10/2020   HCT 24.3 (L) 04/10/2020   MCV 99.6 04/10/2020   PLT 231 04/10/2020   Lab Results  Component Value Date   ALT 18 03/27/2020   AST 18 03/27/2020   ALKPHOS 77 03/27/2020   BILITOT 0.5 03/27/2020   Lab Results  Component Value Date   INR 1.0 03/26/2020   INR 1.2 03/13/2020   INR 1.1 03/03/2020    Current radiological studies    No results found.  Disposition:  Vent SNF   Allergies as of 04/11/2020      Reactions   Prednisone Other (See Comments)   Increases blood sugar      Medication List    STOP taking these medications   albuterol 108 (90 Base) MCG/ACT inhaler Commonly known as: VENTOLIN HFA Replaced by: albuterol (2.5 MG/3ML) 0.083% nebulizer solution   amLODipine 2.5 MG tablet Commonly known as: NORVASC   aspirin 81 MG EC tablet   carvedilol 25 MG tablet Commonly known as: COREG   cetirizine 10 MG tablet Commonly known as: ZYRTEC   esomeprazole 40 MG capsule Commonly known as: NEXIUM   furosemide 20 MG tablet Commonly known as: LASIX   glipiZIDE 2.5 MG 24 hr tablet Commonly known as: GLUCOTROL XL   HYDROcodone-acetaminophen 10-325 MG tablet Commonly known as: NORCO   linagliptin 5 MG Tabs tablet Commonly known as: TRADJENTA   lisinopril 40 MG tablet Commonly known as: ZESTRIL   meclizine 25 MG tablet Commonly known as: ANTIVERT   methocarbamol 750 MG tablet Commonly known as: ROBAXIN   pramipexole 0.125 MG tablet Commonly known as: MIRAPEX     saxagliptin HCl 2.5 MG Tabs tablet Commonly known as: ONGLYZA   Voltaren 1 % Gel Generic drug: diclofenac Sodium     TAKE these medications   acetaminophen 325 MG tablet Commonly known as: TYLENOL Take 650 mg by mouth every 6 (six) hours as needed for moderate pain.   albuterol (2.5 MG/3ML) 0.083% nebulizer solution Commonly known as: PROVENTIL Take 3 mLs (2.5 mg total) by nebulization every 2 (two) hours as needed for wheezing or shortness of breath. Replaces: albuterol 108 (90 Base) MCG/ACT inhaler   Centravites 50 Plus Tabs Take 1 tablet by mouth daily.   colchicine 0.6 MG tablet Take 0.6 mg by mouth daily.   docusate 50 MG/5ML liquid Commonly known as: COLACE Place 10 mLs (100 mg total) into feeding tube 2 (  two) times daily as needed for mild constipation.   feeding supplement (PROSource TF) liquid Place 45 mLs into feeding tube daily.   feeding supplement (VITAL AF 1.2 CAL) Liqd Place 1,000 mLs into feeding tube continuous.   ferrous sulfate 325 (65 FE) MG tablet Take 325 mg by mouth 2 (two) times daily with a meal.   free water Soln Place 100 mLs into feeding tube every 4 (four) hours.   heparin 5000 UNIT/ML injection Inject 1 mL (5,000 Units total) into the skin every 8 (eight) hours.   insulin aspart 100 UNIT/ML injection Commonly known as: novoLOG Inject 0-15 Units into the skin every 4 (four) hours.   insulin aspart 100 UNIT/ML injection Commonly known as: novoLOG Inject 6 Units into the skin every 4 (four) hours.   insulin detemir 100 UNIT/ML injection Commonly known as: LEVEMIR Inject 0.18 mLs (18 Units total) into the skin daily.   melatonin 3 MG Tabs tablet Place 1 tablet (3 mg total) into feeding tube at bedtime.   metoprolol tartrate 25 mg/10 mL Susp Commonly known as: LOPRESSOR Place 5 mLs (12.5 mg total) into feeding tube every 8 (eight) hours.   modafinil 100 MG tablet Commonly known as: PROVIGIL Place 1 tablet (100 mg total) into  feeding tube daily.   mouth rinse Liqd solution 15 mLs by Mouth Rinse route in the morning, at noon, and at bedtime.   pantoprazole sodium 40 mg/20 mL Pack Commonly known as: PROTONIX Place 20 mLs (40 mg total) into feeding tube at bedtime.   polyethylene glycol 17 g packet Commonly known as: MIRALAX / GLYCOLAX Place 17 g into feeding tube daily as needed for moderate constipation.   pravastatin 40 MG tablet Commonly known as: PRAVACHOL Take 40 mg by mouth daily.   Uloric 40 MG tablet Generic drug: febuxostat Take 80 mg by mouth daily.        Follow-up appointment    Discharge Condition:    stable  Signature  Johnsie Cancel, NP-C [facility] Contact / Pager information can be found on Amion  04/11/2020, 9:07 AM   PCCM:  75 year old female post cardiac arrest severe anoxic brain injury requiring intubation mechanical ventilation subsequently tracheostomy tube dependent.  Now prolonged ventilator wean secondary to chronic respiratory failure morbid obesity and tracheostomy tube. BP (!) 112/59   Pulse 94   Temp 99.6 F (37.6 C) (Axillary)   Resp (!) 21   Ht 5\' 8"  (1.727 m)   Wt 129.2 kg   LMP  (LMP Unknown)   SpO2 100%   BMI 43.31 kg/m   General: Morbidly obese chronically ill-appearing HEENT: #6 cuffed XLT Shiley no significant secretions, pupils reactive, opens eyes to voice Neuro: Opens eyes spontaneously and attempts to track withdraws to pain, does not follow commands. Cardiac: Regular rhythm S1-S2 Lungs: Bilateral mechanically ventilated breath sounds Abdomen: Soft nontender nondistended Labs: Reviewed  Assessment: Chronic hypoxemic respiratory failure, vent dependent, tracheostomy tube in place OHS, OSA baseline Anoxic brain injury secondary to cardiac arrest Periorbital fracture  Hyperglycemia DNR  Plan: Likely need prolonged mechanical vent support Daily pressure support trials Eventual transition to trach collar trial  as tolerated. Due to obesity suspect may need vent support. Slow recovery expected from anoxic brain injury. Continue support care  DNR form signed at discharge   32 mins spent with discharge coordination   Garner Nash, DO [facility] 04/11/2020 11:11 AM

## 2020-04-10 DIAGNOSIS — A419 Sepsis, unspecified organism: Secondary | ICD-10-CM | POA: Diagnosis not present

## 2020-04-10 DIAGNOSIS — R6521 Severe sepsis with septic shock: Secondary | ICD-10-CM | POA: Diagnosis not present

## 2020-04-10 LAB — GLUCOSE, CAPILLARY
Glucose-Capillary: 134 mg/dL — ABNORMAL HIGH (ref 70–99)
Glucose-Capillary: 123 mg/dL — ABNORMAL HIGH (ref 70–99)
Glucose-Capillary: 139 mg/dL — ABNORMAL HIGH (ref 70–99)
Glucose-Capillary: 158 mg/dL — ABNORMAL HIGH (ref 70–99)
Glucose-Capillary: 140 mg/dL — ABNORMAL HIGH (ref 70–99)
Glucose-Capillary: 140 mg/dL — ABNORMAL HIGH (ref 70–99)

## 2020-04-10 LAB — CBC
HCT: 24.3 % — ABNORMAL LOW (ref 36.0–46.0)
Hemoglobin: 7.6 g/dL — ABNORMAL LOW (ref 12.0–15.0)
MCH: 31.1 pg (ref 26.0–34.0)
MCHC: 31.3 g/dL (ref 30.0–36.0)
MCV: 99.6 fL (ref 80.0–100.0)
Platelets: 231 10*3/uL (ref 150–400)
RBC: 2.44 MIL/uL — ABNORMAL LOW (ref 3.87–5.11)
RDW: 17.1 % — ABNORMAL HIGH (ref 11.5–15.5)
WBC: 13.1 10*3/uL — ABNORMAL HIGH (ref 4.0–10.5)
nRBC: 0 % (ref 0.0–0.2)

## 2020-04-10 NOTE — Significant Event (Signed)
Reason for Call : Called to room by patient's family member due to patient having a cough and then ventilator alarming.     Initial Focused Assessment: On exam minute ventilation was low on the ventilator. Patient had labored breathing with pulse oximetry remaining above 90 initially. Patient interacting initially, patient trying to cough but not able to.  Facial expressions were of distress.   Interventions: Attempted to suction without success. Suction catheter was clogged so after saline cleared the catheter suction of the trach was attempted again.  Small mucus plugs were suctioned out. Patient began to become more and more unresponsive with saturations and heart rate dropping. Respiratory called for ventilator management and PCCM MD called about patient condition. Ventilator placed in standby and bag valve on 15L oxygen administered to trach. Initially hard to bag but then became easy.  Heart rate and saturations came back up rapidly.    Vitals:   04/10/20 1227 04/10/20 1228 04/10/20 1230 04/10/20 1300  BP:    (!) 144/83  Pulse: (!) 37  (!) 109 (!) 112  Temp:      Resp: 11 (!) 9 16 (!) 36  Height:      Weight:      SpO2: (!) 28%  100% 100%  TempSrc:      BMI (Calculated):         Plan of Care: Respiratory arrived and ventilator management was taken care of by the therapist.  Per physician verbal orders for frequent saline lavages were ordered.  See flowsheets for post event assessment.     MD Notified: June Leap DO  Redgie Grayer, RN

## 2020-04-10 NOTE — Progress Notes (Signed)
NAME:  Kimberly Howell, MRN:  [id number], DOB:  01/20/1946, LOS: 92 ADMISSION DATE:  03/03/2020, CONSULTATION DATE:  04/10/20 REFERRING MD:  EDP, CHIEF COMPLAINT:  Cardiac arrest   Brief History   74 year old female with PMH chronic respiratory failure with COPD on 3 L home O2 and CPAP nightly, stage IV CKD, hypertension, morbid obesity who had a cardiac arrest with >45 minutes CPR before ROSC then lost pulses three more times.  Intubated and PCCM consulted for admission  Past Medical History  Morbid obesity with BMI of 50.0-59.9, adult ([place]) Chronic respiratory failure (HCC) CKD (chronic kidney disease) stage 3, GFR 30-59 ml/min (HCC) Essential hypertension OSA on CPAP  Significant Hospital Events   9/18 Admit to PCCM 9/28 Trach  10/4 Started Depakote   Consults:  Ophthamology ENT Neurology Palliative  Procedures:  9/18 ETT >> 9/28 9/21 CVC 3L >> to be taken out 9/29 post PEG 9/27 LP  9/28 Tracheostomy  9/29 IR PEG  10/6 Trach collar  Significant Diagnostic Tests:  CT head/C-spine/Maxillofacial >> Small left frontal scalp hematoma without skull fracture. Fracture of the left lamina papyracea with herniation of the left medial rectus muscle into the ethmoid sinus. Small amount of retro bulbar stranding, likely hemorrhage.  Right lung apex consolidation concerning for pneumonia. TTE 9/18 >> LVEF 60-65%; no regional wall abnormalities; G1DD, normal RV EEG 9/20 >> diffuse encephalopathy MRI brain 9/24 >> Findings are highly suspicious for hypoxic/ischemic injury. Two punctate acute infarcts within the paramedian right frontal lobe are difficult to exclude. Medially displaced acute fracture of the left lamina papyracea. There is also an acute, displaced fracture of the left fovea ethmoidalis. Orbital fat and the medial rectus muscle extend into the left ethmoid sinus. Additionally, orbital fat extends from the left ethmoid sinus through the fracture defect in the fovea ethmoidalis  intracranially into the left anterior cranial fossa. The intracranial component of orbital fat measures 1 cm and exerts mild local mass effect upon the anteroinferior left frontal lobe.   Micro Data:  9/18 Sars-CoV-2>>negative 9/18 MRSA >> positive 9/27 CSF culture >> negative 9/27 UC >> negative 9/27 BC x 2 >> negative 9/28 BAL >> negative 10/2 Tracheal aspirate >>  10/2 UA >> negative  10/11 Tracheal aspirate >> moderate candida albicans 10/20 resp cx: neg 10/20 blood: 1 of 2 positive for MRSE  Antimicrobials:  none  Interim history/subjective:   Patient remains on full mechanical support.  Suspect prolonged mechanical vent wean need.  Awaiting LTAC and vent wean SNF placement  Objective   Blood pressure 117/71, pulse 84, temperature 99.5 F (37.5 C), temperature source Axillary, resp. rate (!) 22, height _0  (1.727 m), weight 129.2 kg, SpO2 100 %.    Vent Mode: PSV FiO2 (%):  [30 %] 30 % Set Rate:  [22 bmp] 22 bmp Vt Set:  [380 mL] 380 mL PEEP:  [8 cmH20] 8 cmH20 Pressure Support:  [10 cmH20] 10 cmH20 Plateau Pressure:  [15 cmH20-20 cmH20] 15 cmH20   Intake/Output Summary (Last 24 hours) at 04/10/2020 0926 Last data filed at 04/10/2020 0600 Gross per 24 hour  Intake 1260 ml  Output 300 ml  Net 960 ml   Filed Weights   04/08/20 0441 04/09/20 0500 04/10/20 0500  Weight: 125.6 kg 124.9 kg 129.2 kg   Physical Exam: General: Morbidly obese, tracheostomy tube in place on mechanical life support HEENT: Frontal head injury, left orbital injury, less swollen healing Neck: Tracheostomy tube in place no secretions #6 XLT Respiratory: Clear  to auscultation bilaterally bilateral ventilated breath sounds Cardiovascular: Regular rate rhythm, S1-S2 GI: Soft nontender nondistended Extremities: No edema Neuro: Eyes open to voice  CBC Latest Ref Rng & Units 04/10/2020 04/09/2020 04/07/2020  WBC 4.0 - 10.5 K/uL 13.1(H) 11.4(H) 11.4(H)  Hemoglobin 12.0 - 15.0 g/dL 7.6(L)  10.8(L) 8.9(L)  Hematocrit 36 - 46 % 24.3(L) 34.9(L) 27.9(L)  Platelets 150 - 400 K/uL 231 154 178    CMP Latest Ref Rng & Units 04/09/2020 04/06/2020 04/04/2020  Glucose 70 - 99 mg/dL 144(H) 139(H) 126(H)  BUN 8 - 23 mg/dL 84(H) 83(H) 87(H)  Creatinine 0.44 - 1.00 mg/dL 1.53(H) 1.41(H) 1.49(H)  Sodium 135 - 145 mmol/L 143 145 143  Potassium 3.5 - 5.1 mmol/L 5.3(H) 5.2(H) 4.9  Chloride 98 - 111 mmol/L 112(H) 111 107  CO2 22 - 32 mmol/L 18(L) 22 24  Calcium 8.9 - 10.3 mg/dL 9.1 9.2 9.2  Total Protein 6.5 - 8.1 g/dL - - -  Total Bilirubin 0.3 - 1.2 mg/dL - - -  Alkaline Phos 38 - 126 U/L - - -  AST 15 - 41 U/L - - -  ALT 0 - 44 U/L - - -    Resolved Problem List:   Hypernatremia  Cardiac arrest with hypoxic and hypercarbic respiratory failure OHS Patient with prolonged downtime  > 45 minutes. Presumed PEA Anemia 2/2 chronic disease, critical illness, stable  Hb 9.3, s/p 2 unit RBC on 9/25 and 10/2  Plan Trend cbc Transfuse for hgb < 7 HFpEF, baseline HTN Echo Grade 1 diastolic dysfunction Plan Cont strict I&O Holding his home amlodipine, carvedilol, lasix and ace-I   Assessment & Plan:   Acute on chronic respiratory failure with hypoxia- now s/p trach Tracheobroncomalacia OHS/OSA - trach possibly in suboptimal position, getting a bivona adjustable to see if any possibility she can be weaned from vent - it was felt with TC trials presently she occludes airway against posterior wall due to lack of rigidity in structure of trachea Plan: Awaiting LTAC placement, appropriate for vent to SNF placement and prolonged mechanical vent wean needed. Would likely benefit from a Bivona trach however these are on back order and we do not have one. This does not preclude her ability to be discharged to a vent SNF once approved.. Mechanical ventilator settings reviewed as below: Vent Mode: PSV FiO2 (%):  [30 %] 30 % Set Rate:  [22 bmp] 22 bmp Vt Set:  [380 mL] 380 mL PEEP:  [8  cmH20] 8 cmH20 Pressure Support:  [10 cmH20] 10 cmH20 Plateau Pressure:  [15 cmH20-20 cmH20] 15 cmH20  Acute encephalopathy secondary to sedative medications -  Anoxic brain injury with anoxic myoclonus - improving -Holding sedating meds  Fever: Unclear etiology -Observe for any sign of infection  CKD -Continue to follow urine output BMP  Periorbital Fracture with Hyphema, stable Herniation of the left medial rectus muscle into the sinus with small amount of retro bulbar stranding.  Ophthalmologist noted patient's EOM intact and no limitations of gaze & no signs of entrapment seen.   ENT recommends to not repair orbital injury at this time. Neurosurgery consulted 9/28, stating no indication for repair unless clear CSF leak which the patient has no symptoms of.  Plan:  Outpatient follow-up  Hyperglycemia  CBGs with Levemir and SSI for glucose control  Constipation As needed bowel regimen  Anemia, chronic Intermittent vaginal bleeding Plan:  Observe for any signs of bleeding. Outpatient follow-up  At Risk Malnutrition  PEG Status  Disposition pending LTAC placement  Best practice:  Diet: TF Pain/Anxiety/Delirium protocol (if indicated): hold VAP protocol (if indicated): yes  DVT prophylaxis: Heparin SQ/ SCDs GI prophylaxis: Protonix Glucose control: SSI & levemir BID  Mobility: PT Code Status: DNR Family Communication: we will update family  Disposition: ICU, long-term vent wean   Garner Nash, DO [facility] 04/10/2020 9:26 AM

## 2020-04-10 NOTE — TOC Progression Note (Addendum)
Transition of Care [facility]) - Progression Note    Patient Details  Name: Kimberly Howell MRN: [id number] Date of Birth: [birth year]  Transition of Care [facility]) CM/SW [place], [place] Phone Number: 04/10/2020, 2:02 PM  Clinical Narrative:     Update 10/26 2:43pm-Melissa from [facility] called CSW to let her know they can accept patient tomorrow for Vent/SNF placement. CSW scheduled transportation pick up through [employer] transport. Pick up time will be at 12:00pm. Family has been updated.  CSW spoke with [facility] with [facility]. Melissa confirmed with CSW that insurance is still pending.  Patient has SNF bed at [facility]. Insurance authorization pending.  CSW will continue to follow.  Expected Discharge Plan: Skilled Nursing Facility Barriers to Discharge: Continued Medical Work up  Expected Discharge Plan and Services Expected Discharge Plan: [place]   Discharge Planning Services: CM Consult Post Acute Care Choice: Long Term Acute Care (LTAC) Living arrangements for the past 2 months: Single Family Home                                       Social Determinants of Health (SDOH) Interventions    Readmission Risk Interventions Readmission Risk Prevention Plan 03/14/2020  Transportation Screening Complete  PCP or Specialist Appt within 5-7 Days Complete  Home Care Screening Complete  Medication Review (RN CM) Complete

## 2020-04-11 ENCOUNTER — Encounter: Payer: Self-pay | Admitting: Adult Health

## 2020-04-11 LAB — GLUCOSE, CAPILLARY
Glucose-Capillary: 138 mg/dL — ABNORMAL HIGH (ref 70–99)
Glucose-Capillary: 153 mg/dL — ABNORMAL HIGH (ref 70–99)
Glucose-Capillary: 146 mg/dL — ABNORMAL HIGH (ref 70–99)

## 2020-04-11 MED ORDER — INSULIN DETEMIR 100 UNIT/ML ~~LOC~~ SOLN: 18.0000 [IU] | Freq: Every day | SUBCUTANEOUS | 11 refills | Status: AC

## 2020-04-11 MED ORDER — FREE WATER: 100.0000 mL | 0 refills | Status: AC

## 2020-04-11 MED ORDER — PANTOPRAZOLE SODIUM 40 MG PO PACK: 40.0000 mg | PACK | Freq: Every day | ORAL | 0 refills | Status: AC

## 2020-04-11 MED ORDER — MODAFINIL 100 MG PO TABS: 100.0000 mg | ORAL_TABLET | Freq: Every day | ORAL | 0 refills | Status: AC

## 2020-04-11 MED ORDER — METOPROLOL TARTRATE 25 MG/10 ML ORAL SUSPENSION: 12.5000 mg | Freq: Three times a day (TID) | ORAL | 0 refills | Status: AC

## 2020-04-11 MED ORDER — HEPARIN SODIUM (PORCINE) 5000 UNIT/ML IJ SOLN: 5000.0000 [IU] | Freq: Three times a day (TID) | INTRAMUSCULAR | 0 refills | Status: AC

## 2020-04-11 MED ORDER — PROSOURCE TF PO LIQD: 45.0000 mL | Freq: Every day | ORAL | 0 refills | Status: AC

## 2020-04-11 MED ORDER — ALBUTEROL SULFATE (2.5 MG/3ML) 0.083% IN NEBU: 2.5000 mg | INHALATION_SOLUTION | RESPIRATORY_TRACT | 12 refills | Status: AC | PRN

## 2020-04-11 MED ORDER — ORAL CARE MOUTH RINSE: 15.0000 mL | Freq: Three times a day (TID) | OROMUCOSAL | 0 refills | Status: AC

## 2020-04-11 MED ORDER — MELATONIN 3 MG PO TABS: 3.0000 mg | ORAL_TABLET | Freq: Every day | ORAL | 0 refills | Status: AC

## 2020-04-11 MED ORDER — POLYETHYLENE GLYCOL 3350 17 G PO PACK: 17.0000 g | PACK | Freq: Every day | ORAL | 0 refills | Status: AC | PRN

## 2020-04-11 MED ORDER — INSULIN ASPART 100 UNIT/ML ~~LOC~~ SOLN
0.0000 [IU] | SUBCUTANEOUS | 11 refills | Status: AC
Start: 2020-04-11 — End: ?

## 2020-04-11 MED ORDER — VITAL AF 1.2 CAL PO LIQD: 1000.0000 mL | ORAL | 0 refills | Status: AC

## 2020-04-11 MED ORDER — INSULIN ASPART 100 UNIT/ML ~~LOC~~ SOLN: 6.0000 [IU] | SUBCUTANEOUS | 11 refills | Status: AC

## 2020-04-11 MED ORDER — DOCUSATE SODIUM 50 MG/5ML PO LIQD: 100.0000 mg | Freq: Two times a day (BID) | ORAL | 0 refills | Status: AC | PRN

## 2020-04-11 NOTE — TOC Transition Note (Signed)
Transition of Care [facility]) - CM/SW Discharge Note   Patient Details  Name: Kimberly Howell MRN: [id number] Date of Birth: 1946-01-25  Transition of Care [facility]) CM/SW Contact:  Trula Ore, [place] Phone Number: 04/11/2020, 10:41 AM   Clinical Narrative:     Patient will DC to: [place] date: 04/11/2020  Family notified: [name]  Transport by: [facility]   ?  Per MD patient ready for DC to [place], patient, patient's family, and facility notified of DC. Discharge Summary sent to facility. RN given number for report tele# [phone] [phone] ask for [place] (if not available ask for [facility] the Conservation officer, historic buildings. DC packet on chart. DNR signed by MD on chart. Ambulance transport requested for patient.  CSW signing off.  Final next level of care: Skilled Nursing Facility Barriers to Discharge: No Barriers Identified   Patient Goals and CMS Choice Patient states their goals for this hospitalization and ongoing recovery are:: to go to SNF CMS Medicare.gov Compare Post Acute Care list provided to:: Patient Represenative (must comment) [name]) Choice offered to / list presented to : Adult Children [name])  Discharge Placement              Patient chooses bed at:  [facility]) Patient to be transferred to facility by: [facility] Name of family member notified: [name] Patient and family notified of of transfer: 04/11/20  Discharge Plan and Services   Discharge Planning Services: CM Consult Post Acute Care Choice: Long Term Acute Care (LTAC)                               Social Determinants of Health (SDOH) Interventions     Readmission Risk Interventions Readmission Risk Prevention Plan 03/14/2020  Transportation Screening Complete  PCP or Specialist Appt within 5-7 Days Complete  Home Care Screening Complete  Medication Review (RN CM)  Complete

## 2020-04-12 DIAGNOSIS — J9610 Chronic respiratory failure, unspecified whether with hypoxia or hypercapnia: Secondary | ICD-10-CM | POA: Diagnosis not present

## 2020-04-15 DIAGNOSIS — M25511 Pain in right shoulder: Secondary | ICD-10-CM | POA: Diagnosis not present

## 2020-04-16 DIAGNOSIS — N189 Chronic kidney disease, unspecified: Secondary | ICD-10-CM | POA: Diagnosis not present

## 2020-04-16 DIAGNOSIS — J449 Chronic obstructive pulmonary disease, unspecified: Secondary | ICD-10-CM | POA: Diagnosis not present

## 2020-04-16 DIAGNOSIS — R059 Cough, unspecified: Secondary | ICD-10-CM | POA: Diagnosis not present

## 2020-04-16 DIAGNOSIS — J9621 Acute and chronic respiratory failure with hypoxia: Secondary | ICD-10-CM | POA: Diagnosis not present

## 2020-04-16 DIAGNOSIS — F0631 Mood disorder due to known physiological condition with depressive features: Secondary | ICD-10-CM | POA: Diagnosis not present

## 2020-04-16 DIAGNOSIS — M2550 Pain in unspecified joint: Secondary | ICD-10-CM | POA: Diagnosis not present

## 2020-04-16 DIAGNOSIS — F331 Major depressive disorder, recurrent, moderate: Secondary | ICD-10-CM | POA: Diagnosis not present

## 2020-04-16 DIAGNOSIS — N184 Chronic kidney disease, stage 4 (severe): Secondary | ICD-10-CM | POA: Diagnosis not present

## 2020-04-16 DIAGNOSIS — I469 Cardiac arrest, cause unspecified: Secondary | ICD-10-CM | POA: Diagnosis not present

## 2020-04-16 DIAGNOSIS — G4733 Obstructive sleep apnea (adult) (pediatric): Secondary | ICD-10-CM | POA: Diagnosis not present

## 2020-04-16 DIAGNOSIS — F411 Generalized anxiety disorder: Secondary | ICD-10-CM | POA: Diagnosis not present

## 2020-04-16 DIAGNOSIS — I10 Essential (primary) hypertension: Secondary | ICD-10-CM | POA: Diagnosis not present

## 2020-04-16 DIAGNOSIS — Z1152 Encounter for screening for COVID-19: Secondary | ICD-10-CM | POA: Diagnosis not present

## 2020-04-16 DIAGNOSIS — E119 Type 2 diabetes mellitus without complications: Secondary | ICD-10-CM | POA: Diagnosis not present

## 2020-04-16 DIAGNOSIS — J9611 Chronic respiratory failure with hypoxia: Secondary | ICD-10-CM | POA: Diagnosis not present

## 2020-04-16 DIAGNOSIS — G931 Anoxic brain damage, not elsewhere classified: Secondary | ICD-10-CM | POA: Diagnosis not present

## 2020-04-16 DIAGNOSIS — J9610 Chronic respiratory failure, unspecified whether with hypoxia or hypercapnia: Secondary | ICD-10-CM | POA: Diagnosis not present

## 2020-04-16 DIAGNOSIS — F329 Major depressive disorder, single episode, unspecified: Secondary | ICD-10-CM | POA: Diagnosis not present

## 2020-04-16 DIAGNOSIS — M25519 Pain in unspecified shoulder: Secondary | ICD-10-CM | POA: Diagnosis not present

## 2020-04-16 DIAGNOSIS — Z9911 Dependence on respirator [ventilator] status: Secondary | ICD-10-CM | POA: Diagnosis not present

## 2020-04-16 DIAGNOSIS — G253 Myoclonus: Secondary | ICD-10-CM | POA: Diagnosis not present

## 2020-04-16 DIAGNOSIS — Z79899 Other long term (current) drug therapy: Secondary | ICD-10-CM | POA: Diagnosis not present

## 2020-04-16 DIAGNOSIS — E1169 Type 2 diabetes mellitus with other specified complication: Secondary | ICD-10-CM | POA: Diagnosis not present

## 2020-04-17 DIAGNOSIS — E1169 Type 2 diabetes mellitus with other specified complication: Secondary | ICD-10-CM | POA: Diagnosis not present

## 2020-04-17 DIAGNOSIS — G931 Anoxic brain damage, not elsewhere classified: Secondary | ICD-10-CM | POA: Diagnosis not present

## 2020-04-17 DIAGNOSIS — Z9911 Dependence on respirator [ventilator] status: Secondary | ICD-10-CM | POA: Diagnosis not present

## 2020-04-17 DIAGNOSIS — I469 Cardiac arrest, cause unspecified: Secondary | ICD-10-CM | POA: Diagnosis not present

## 2020-04-18 DIAGNOSIS — Z9911 Dependence on respirator [ventilator] status: Secondary | ICD-10-CM | POA: Diagnosis not present

## 2020-04-18 DIAGNOSIS — N189 Chronic kidney disease, unspecified: Secondary | ICD-10-CM | POA: Diagnosis not present

## 2020-04-18 DIAGNOSIS — J9610 Chronic respiratory failure, unspecified whether with hypoxia or hypercapnia: Secondary | ICD-10-CM | POA: Diagnosis not present

## 2020-04-23 DIAGNOSIS — Z9911 Dependence on respirator [ventilator] status: Secondary | ICD-10-CM | POA: Diagnosis not present

## 2020-04-23 DIAGNOSIS — J9610 Chronic respiratory failure, unspecified whether with hypoxia or hypercapnia: Secondary | ICD-10-CM | POA: Diagnosis not present

## 2020-04-23 DIAGNOSIS — F329 Major depressive disorder, single episode, unspecified: Secondary | ICD-10-CM | POA: Diagnosis not present

## 2020-04-23 DIAGNOSIS — M2550 Pain in unspecified joint: Secondary | ICD-10-CM | POA: Diagnosis not present

## 2020-04-24 DIAGNOSIS — F411 Generalized anxiety disorder: Secondary | ICD-10-CM | POA: Diagnosis not present

## 2020-04-24 DIAGNOSIS — M2550 Pain in unspecified joint: Secondary | ICD-10-CM | POA: Diagnosis not present

## 2020-04-24 DIAGNOSIS — F331 Major depressive disorder, recurrent, moderate: Secondary | ICD-10-CM | POA: Diagnosis not present

## 2020-04-26 DIAGNOSIS — E1169 Type 2 diabetes mellitus with other specified complication: Secondary | ICD-10-CM | POA: Diagnosis not present

## 2020-04-26 DIAGNOSIS — J9610 Chronic respiratory failure, unspecified whether with hypoxia or hypercapnia: Secondary | ICD-10-CM | POA: Diagnosis not present

## 2020-04-26 DIAGNOSIS — F411 Generalized anxiety disorder: Secondary | ICD-10-CM | POA: Diagnosis not present

## 2020-04-26 DIAGNOSIS — G253 Myoclonus: Secondary | ICD-10-CM | POA: Diagnosis not present

## 2020-04-27 ENCOUNTER — Ambulatory Visit: Payer: Medicare HMO | Admitting: Neurology

## 2020-04-30 DIAGNOSIS — J9611 Chronic respiratory failure with hypoxia: Secondary | ICD-10-CM | POA: Diagnosis not present

## 2020-04-30 DIAGNOSIS — J9610 Chronic respiratory failure, unspecified whether with hypoxia or hypercapnia: Secondary | ICD-10-CM | POA: Diagnosis not present

## 2020-04-30 DIAGNOSIS — Z9911 Dependence on respirator [ventilator] status: Secondary | ICD-10-CM | POA: Diagnosis not present

## 2020-04-30 DIAGNOSIS — R059 Cough, unspecified: Secondary | ICD-10-CM | POA: Diagnosis not present

## 2020-05-01 DIAGNOSIS — F0631 Mood disorder due to known physiological condition with depressive features: Secondary | ICD-10-CM | POA: Diagnosis not present

## 2020-05-01 DIAGNOSIS — F331 Major depressive disorder, recurrent, moderate: Secondary | ICD-10-CM | POA: Diagnosis not present

## 2020-05-01 DIAGNOSIS — F411 Generalized anxiety disorder: Secondary | ICD-10-CM | POA: Diagnosis not present

## 2020-05-08 DIAGNOSIS — F411 Generalized anxiety disorder: Secondary | ICD-10-CM | POA: Diagnosis not present

## 2020-05-08 DIAGNOSIS — F0631 Mood disorder due to known physiological condition with depressive features: Secondary | ICD-10-CM | POA: Diagnosis not present

## 2020-05-08 DIAGNOSIS — F331 Major depressive disorder, recurrent, moderate: Secondary | ICD-10-CM | POA: Diagnosis not present

## 2020-05-09 DIAGNOSIS — G931 Anoxic brain damage, not elsewhere classified: Secondary | ICD-10-CM | POA: Diagnosis not present

## 2020-05-09 DIAGNOSIS — J9611 Chronic respiratory failure with hypoxia: Secondary | ICD-10-CM | POA: Diagnosis not present

## 2020-05-09 DIAGNOSIS — Z9911 Dependence on respirator [ventilator] status: Secondary | ICD-10-CM | POA: Diagnosis not present

## 2020-05-09 DIAGNOSIS — Z79899 Other long term (current) drug therapy: Secondary | ICD-10-CM | POA: Diagnosis not present

## 2020-05-14 DIAGNOSIS — G931 Anoxic brain damage, not elsewhere classified: Secondary | ICD-10-CM | POA: Diagnosis not present

## 2020-05-14 DIAGNOSIS — F419 Anxiety disorder, unspecified: Secondary | ICD-10-CM | POA: Diagnosis not present

## 2020-05-14 DIAGNOSIS — Z9911 Dependence on respirator [ventilator] status: Secondary | ICD-10-CM | POA: Diagnosis not present

## 2020-05-15 DIAGNOSIS — F411 Generalized anxiety disorder: Secondary | ICD-10-CM | POA: Diagnosis not present

## 2020-05-15 DIAGNOSIS — F0631 Mood disorder due to known physiological condition with depressive features: Secondary | ICD-10-CM | POA: Diagnosis not present

## 2020-05-15 DIAGNOSIS — F331 Major depressive disorder, recurrent, moderate: Secondary | ICD-10-CM | POA: Diagnosis not present

## 2020-05-18 DIAGNOSIS — R059 Cough, unspecified: Secondary | ICD-10-CM | POA: Diagnosis not present

## 2020-05-18 DIAGNOSIS — J9610 Chronic respiratory failure, unspecified whether with hypoxia or hypercapnia: Secondary | ICD-10-CM | POA: Diagnosis not present

## 2020-05-18 DIAGNOSIS — Z9911 Dependence on respirator [ventilator] status: Secondary | ICD-10-CM | POA: Diagnosis not present

## 2020-05-18 DIAGNOSIS — R042 Hemoptysis: Secondary | ICD-10-CM | POA: Diagnosis not present

## 2020-05-20 DIAGNOSIS — R509 Fever, unspecified: Secondary | ICD-10-CM | POA: Diagnosis not present

## 2020-05-24 DIAGNOSIS — J9610 Chronic respiratory failure, unspecified whether with hypoxia or hypercapnia: Secondary | ICD-10-CM | POA: Diagnosis not present

## 2020-05-25 DIAGNOSIS — J9610 Chronic respiratory failure, unspecified whether with hypoxia or hypercapnia: Secondary | ICD-10-CM | POA: Diagnosis not present

## 2020-05-25 DIAGNOSIS — Z9911 Dependence on respirator [ventilator] status: Secondary | ICD-10-CM | POA: Diagnosis not present

## 2020-05-26 IMAGING — DX PORTABLE CHEST - 1 VIEW
1 series · 1 of 1 positions shown · non-contrast
Comparison: None.

CLINICAL DATA: Unresponsive.  Seizure.

EXAM:
PORTABLE CHEST 1 VIEW

[chest]
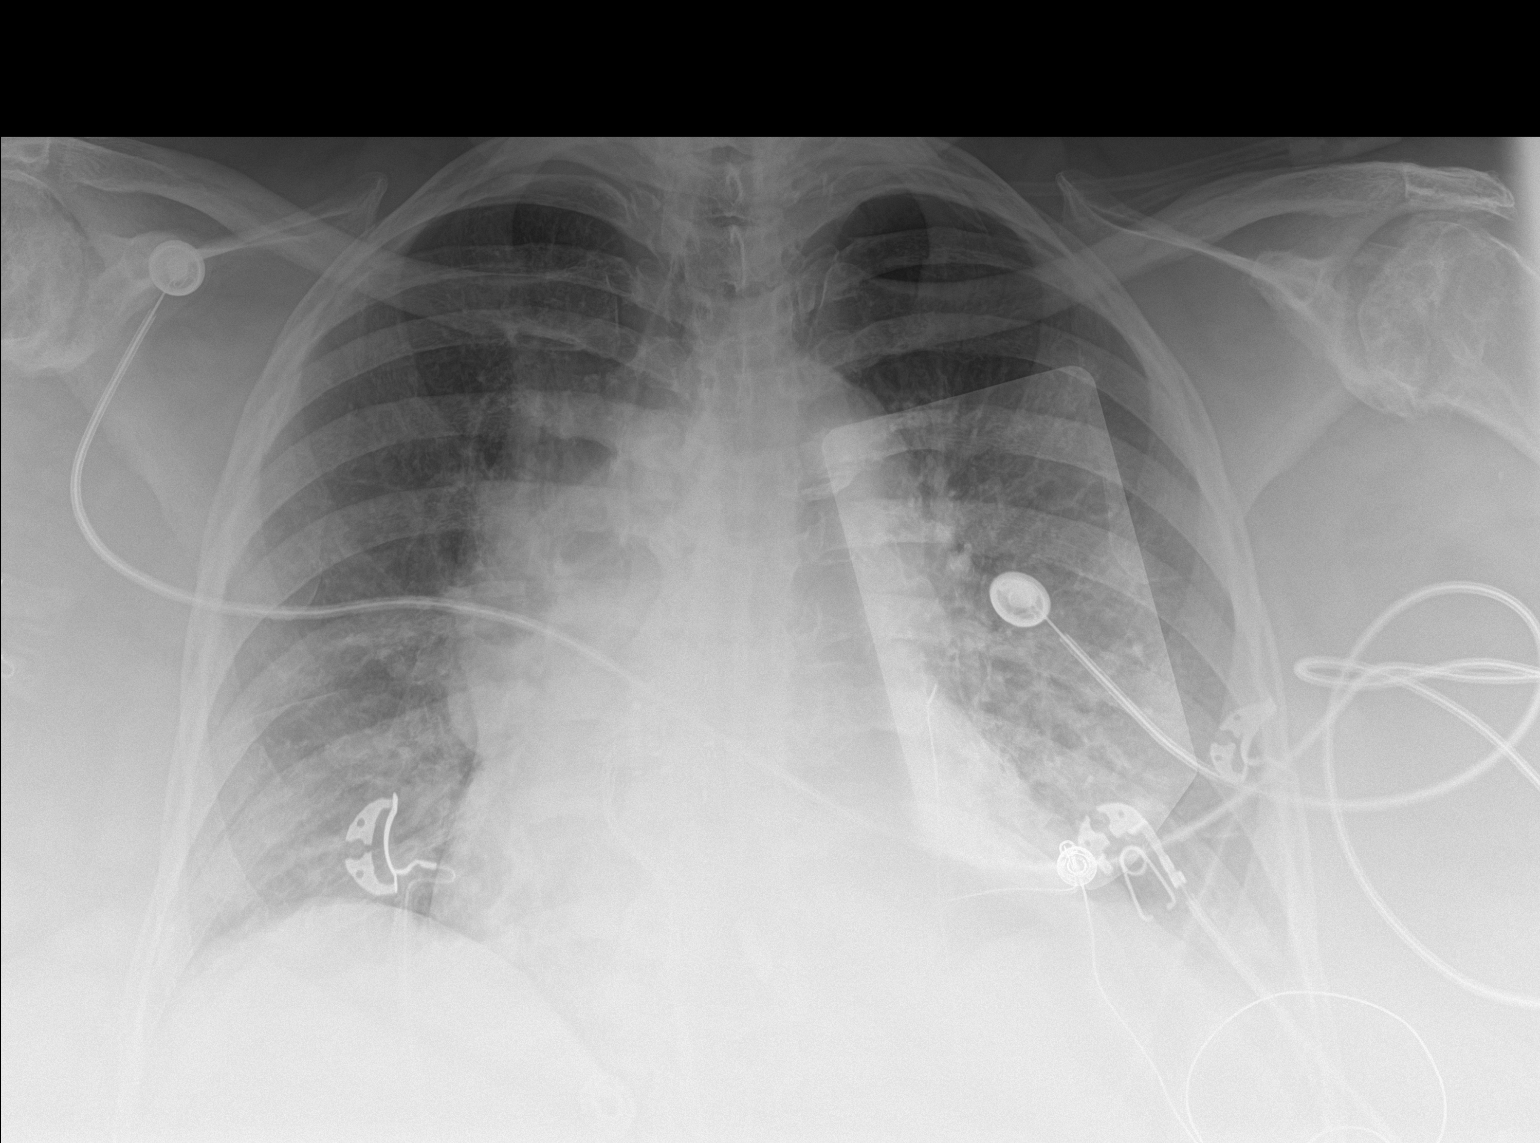

[1 of 1 positions shown; findings below may reference images not displayed]

FINDINGS: Cardiomegaly and vascular pedicle widening. Borderline vascular
congestion accentuated by low volumes. No Kerley lines, air
bronchogram, effusion, or pneumothorax. Probable atelectasis at the
bases. Prominent bilateral glenohumeral osteoarthritis.
IMPRESSION: 1. Low volume chest with probable atelectasis at the bases.
2. Cardiomegaly.

## 2020-05-26 IMAGING — CT CT HEAD WITHOUT CONTRAST
4 series · 16 of 47 positions shown, 18 images · non-contrast
Comparison: None.

CLINICAL DATA: Found unresponsive this morning

EXAM:
CT HEAD WITHOUT CONTRAST
TECHNIQUE: Contiguous axial images were obtained from the base of the skull
through the vertex without intravenous contrast.

[Series 3: head wo · axial · 0.47mm/px · z∈[-176,-46]mm · 7 of 36 slices shown, 9 images]
[im 5/36  brain]
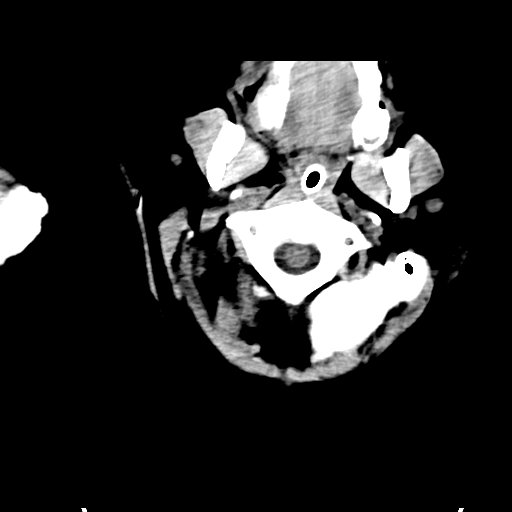
[im 5/36  bone]
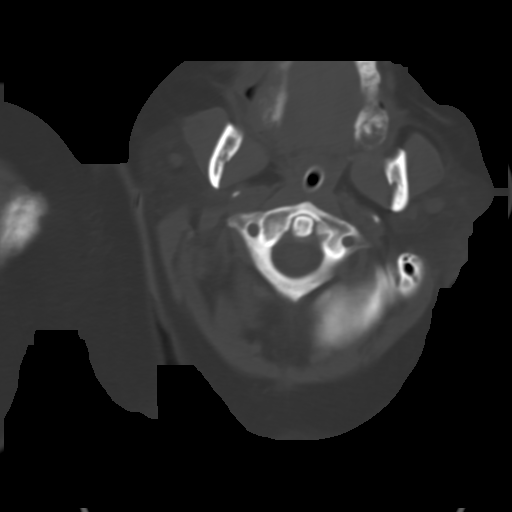
[im 9/36  brain]
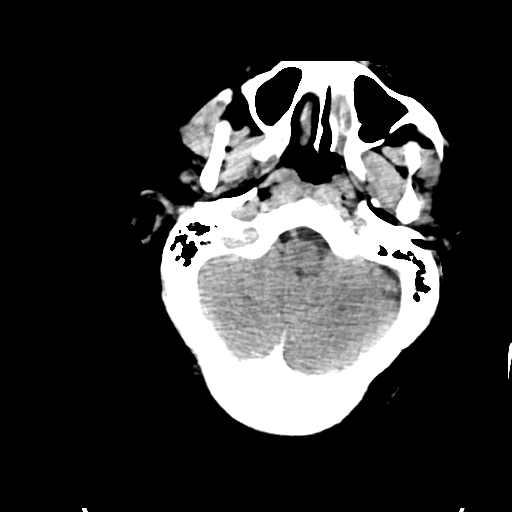
[im 14/36  brain]
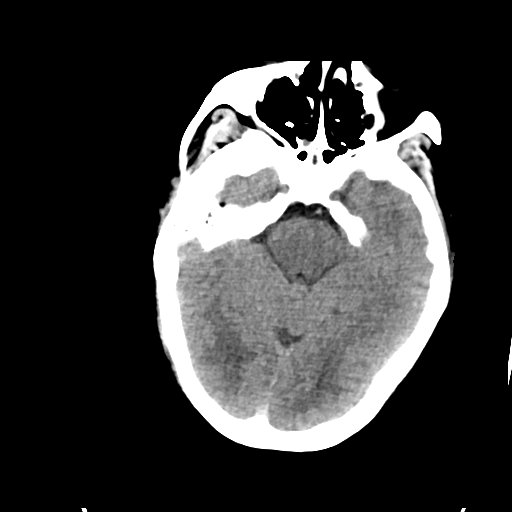
[im 18/36  brain]
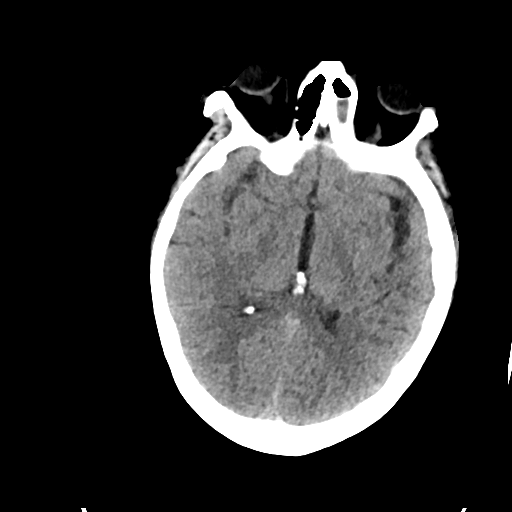
[im 22/36  brain]
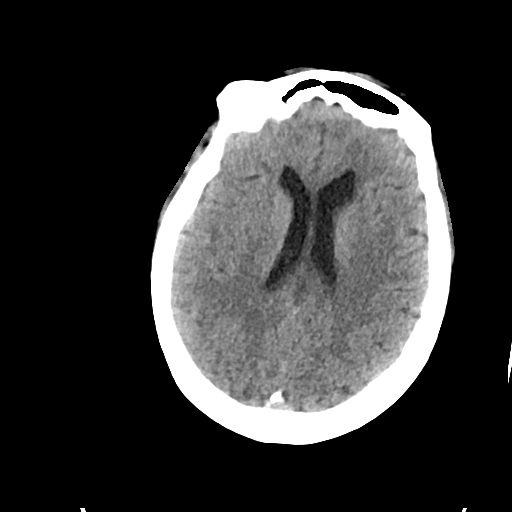
[im 22/36  bone]
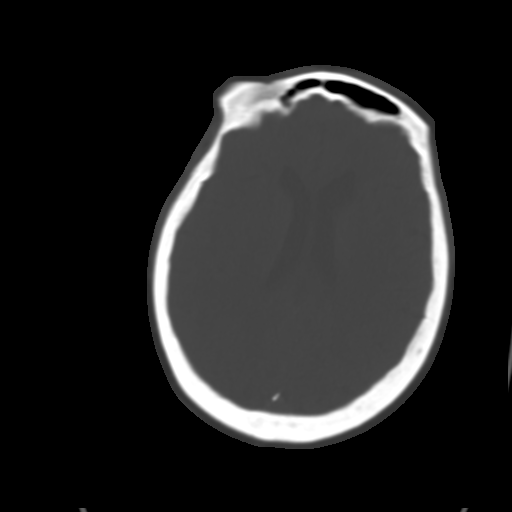
[im 27/36  brain]
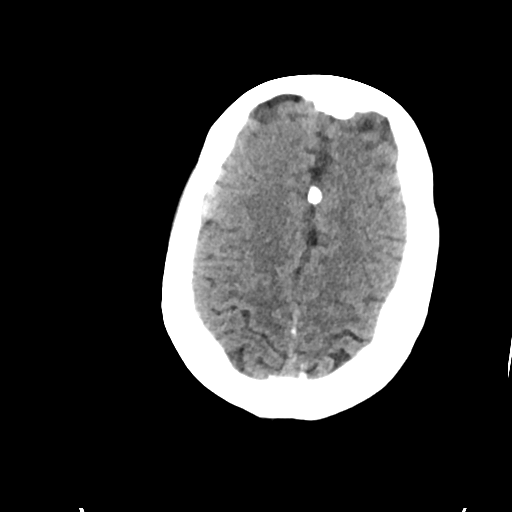
[im 31/36  brain]
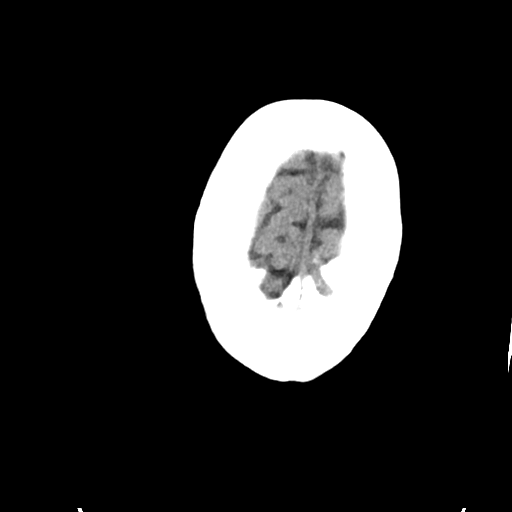

[Series 4: head bone · axial · 0.47mm/px · z∈[-180,-144]mm · 3 of 89 slices shown]
[im 9/89  bone]
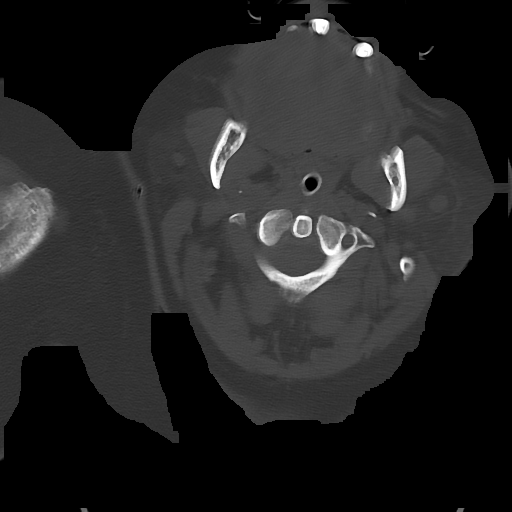
[im 18/89  bone]
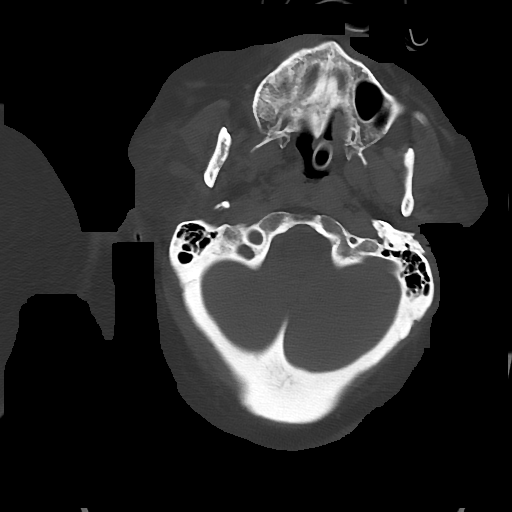
[im 27/89  bone]
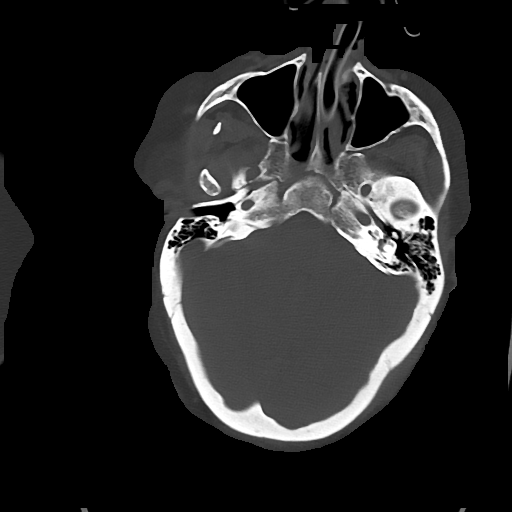

[Series 5: cor soft · coronal · 0.32mm/px · 3 of 73 slices shown]
[im 25/73  brain]
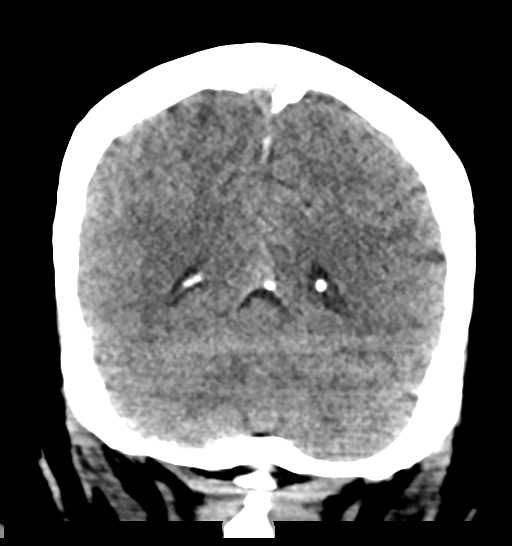
[im 33/73  brain]
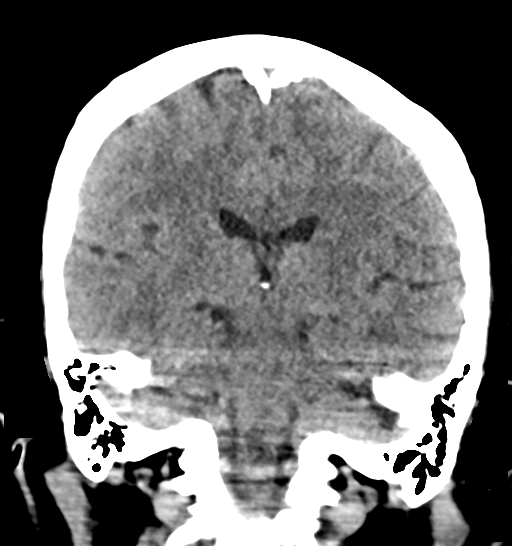
[im 41/73  brain]
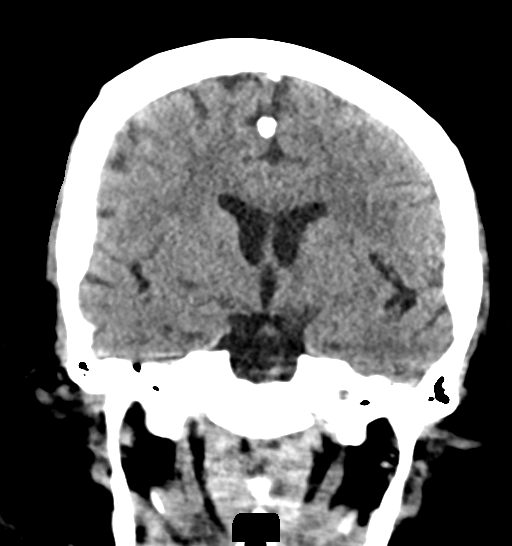

[Series 6: sag soft · sagittal · 0.34mm/px · 3 of 58 slices shown]
[im 20/58  brain]
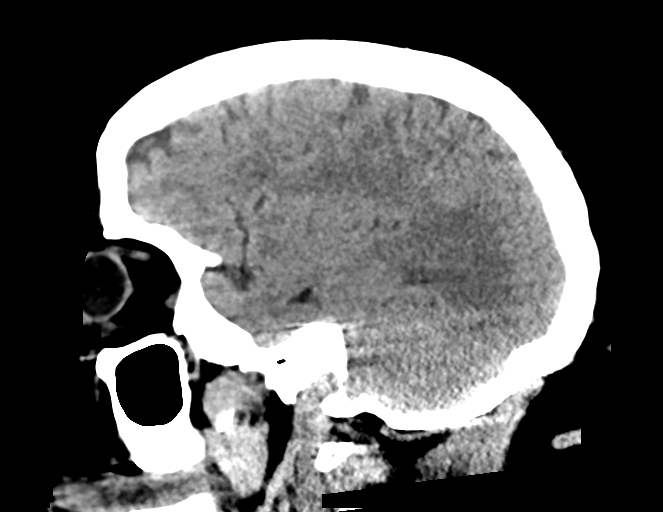
[im 29/58  brain]
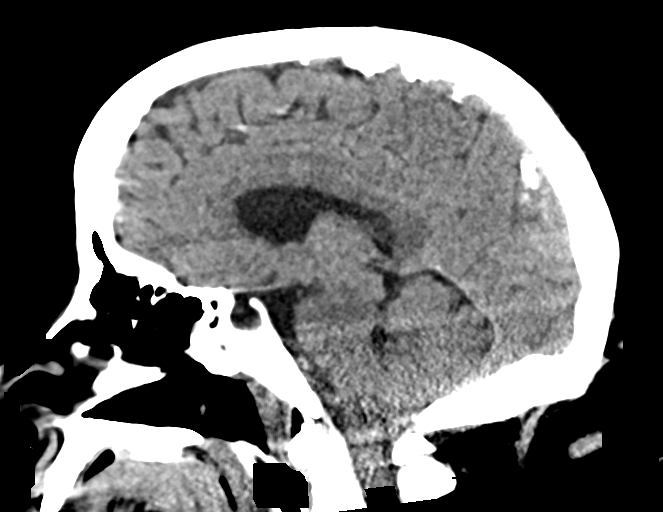
[im 39/58  brain]
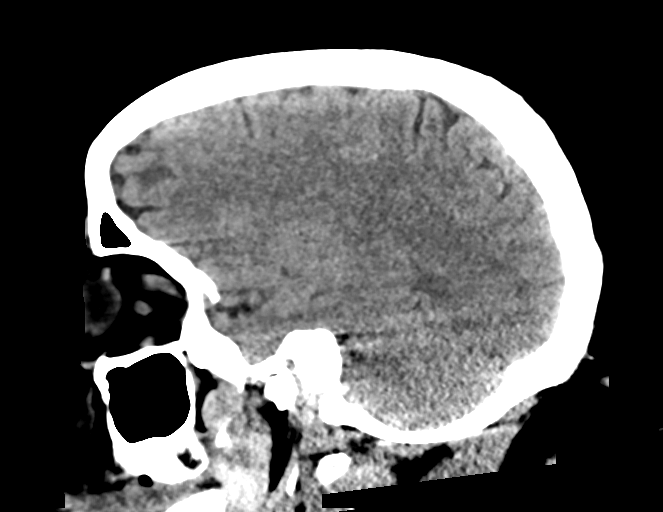

[16 of 47 positions shown; findings below may reference images not displayed]

FINDINGS: Brain: No evidence of acute infarction, hemorrhage, hydrocephalus,
extra-axial collection or mass lesion/mass effect. Probable mild
chronic small vessel ischemic change in the cerebral white matter

Vascular: No hyperdense vessel or unexpected calcification.

Skull: Normal. Negative for fracture or focal lesion.

Sinuses/Orbits: There is an opacified left ethmoid air cell with
expansion and broad bony defect at the fovea ethmoidalis.

Other: Mild image blurring likely from motion
IMPRESSION: 1. No acute finding.
2. Findings of left ethmoid mucocele with erosion of the fovea
ethmoidalis. Recommend ENT referral after convalescence.

## 2020-05-30 DIAGNOSIS — J9611 Chronic respiratory failure with hypoxia: Secondary | ICD-10-CM | POA: Diagnosis not present

## 2020-05-31 DIAGNOSIS — M25519 Pain in unspecified shoulder: Secondary | ICD-10-CM | POA: Diagnosis not present

## 2020-05-31 DIAGNOSIS — E119 Type 2 diabetes mellitus without complications: Secondary | ICD-10-CM | POA: Diagnosis not present

## 2020-05-31 DIAGNOSIS — Z794 Long term (current) use of insulin: Secondary | ICD-10-CM | POA: Diagnosis not present

## 2020-05-31 DIAGNOSIS — K117 Disturbances of salivary secretion: Secondary | ICD-10-CM | POA: Diagnosis not present

## 2020-06-05 DIAGNOSIS — R569 Unspecified convulsions: Secondary | ICD-10-CM | POA: Diagnosis not present

## 2020-06-05 DIAGNOSIS — J95851 Ventilator associated pneumonia: Secondary | ICD-10-CM | POA: Diagnosis not present

## 2020-06-05 DIAGNOSIS — I10 Essential (primary) hypertension: Secondary | ICD-10-CM | POA: Diagnosis not present

## 2020-06-05 DIAGNOSIS — J9621 Acute and chronic respiratory failure with hypoxia: Secondary | ICD-10-CM | POA: Diagnosis not present

## 2020-06-05 DIAGNOSIS — G8929 Other chronic pain: Secondary | ICD-10-CM | POA: Diagnosis not present

## 2020-06-05 DIAGNOSIS — A419 Sepsis, unspecified organism: Secondary | ICD-10-CM | POA: Diagnosis not present

## 2020-06-05 DIAGNOSIS — E7800 Pure hypercholesterolemia, unspecified: Secondary | ICD-10-CM | POA: Diagnosis not present

## 2020-06-05 DIAGNOSIS — D72829 Elevated white blood cell count, unspecified: Secondary | ICD-10-CM | POA: Diagnosis not present

## 2020-06-05 DIAGNOSIS — R531 Weakness: Secondary | ICD-10-CM | POA: Diagnosis not present

## 2020-06-05 DIAGNOSIS — D649 Anemia, unspecified: Secondary | ICD-10-CM | POA: Diagnosis not present

## 2020-06-05 DIAGNOSIS — R000 Tachycardia, unspecified: Secondary | ICD-10-CM | POA: Diagnosis not present

## 2020-06-05 DIAGNOSIS — R0902 Hypoxemia: Secondary | ICD-10-CM | POA: Diagnosis not present

## 2020-06-05 DIAGNOSIS — N189 Chronic kidney disease, unspecified: Secondary | ICD-10-CM | POA: Diagnosis not present

## 2020-06-05 DIAGNOSIS — I517 Cardiomegaly: Secondary | ICD-10-CM | POA: Diagnosis not present

## 2020-06-05 DIAGNOSIS — J984 Other disorders of lung: Secondary | ICD-10-CM | POA: Diagnosis not present

## 2020-06-05 DIAGNOSIS — E11649 Type 2 diabetes mellitus with hypoglycemia without coma: Secondary | ICD-10-CM | POA: Diagnosis not present

## 2020-06-05 DIAGNOSIS — E1122 Type 2 diabetes mellitus with diabetic chronic kidney disease: Secondary | ICD-10-CM | POA: Diagnosis not present

## 2020-06-05 DIAGNOSIS — A414 Sepsis due to anaerobes: Secondary | ICD-10-CM | POA: Diagnosis not present

## 2020-06-05 DIAGNOSIS — R4182 Altered mental status, unspecified: Secondary | ICD-10-CM | POA: Diagnosis not present

## 2020-06-05 DIAGNOSIS — E875 Hyperkalemia: Secondary | ICD-10-CM | POA: Diagnosis not present

## 2020-06-05 DIAGNOSIS — I959 Hypotension, unspecified: Secondary | ICD-10-CM | POA: Diagnosis not present

## 2020-06-05 DIAGNOSIS — R404 Transient alteration of awareness: Secondary | ICD-10-CM | POA: Diagnosis not present

## 2020-06-05 DIAGNOSIS — E669 Obesity, unspecified: Secondary | ICD-10-CM | POA: Diagnosis not present

## 2020-06-05 DIAGNOSIS — I5032 Chronic diastolic (congestive) heart failure: Secondary | ICD-10-CM | POA: Diagnosis not present

## 2020-06-05 DIAGNOSIS — J189 Pneumonia, unspecified organism: Secondary | ICD-10-CM | POA: Diagnosis not present

## 2020-06-05 DIAGNOSIS — E1165 Type 2 diabetes mellitus with hyperglycemia: Secondary | ICD-10-CM | POA: Diagnosis not present

## 2020-06-05 DIAGNOSIS — N184 Chronic kidney disease, stage 4 (severe): Secondary | ICD-10-CM | POA: Diagnosis not present

## 2020-06-05 DIAGNOSIS — E1169 Type 2 diabetes mellitus with other specified complication: Secondary | ICD-10-CM | POA: Diagnosis not present

## 2020-06-05 DIAGNOSIS — G4733 Obstructive sleep apnea (adult) (pediatric): Secondary | ICD-10-CM | POA: Diagnosis not present

## 2020-06-05 DIAGNOSIS — G931 Anoxic brain damage, not elsewhere classified: Secondary | ICD-10-CM | POA: Diagnosis not present

## 2020-06-05 DIAGNOSIS — J188 Other pneumonia, unspecified organism: Secondary | ICD-10-CM | POA: Diagnosis not present

## 2020-06-05 DIAGNOSIS — G9349 Other encephalopathy: Secondary | ICD-10-CM | POA: Diagnosis not present

## 2020-06-05 DIAGNOSIS — I130 Hypertensive heart and chronic kidney disease with heart failure and stage 1 through stage 4 chronic kidney disease, or unspecified chronic kidney disease: Secondary | ICD-10-CM | POA: Diagnosis not present

## 2020-06-05 DIAGNOSIS — R4020 Unspecified coma: Secondary | ICD-10-CM | POA: Diagnosis not present

## 2020-06-05 DIAGNOSIS — I639 Cerebral infarction, unspecified: Secondary | ICD-10-CM | POA: Diagnosis not present

## 2020-06-05 DIAGNOSIS — J449 Chronic obstructive pulmonary disease, unspecified: Secondary | ICD-10-CM | POA: Diagnosis not present

## 2020-06-05 DIAGNOSIS — J9620 Acute and chronic respiratory failure, unspecified whether with hypoxia or hypercapnia: Secondary | ICD-10-CM | POA: Diagnosis not present

## 2020-06-05 DIAGNOSIS — N183 Chronic kidney disease, stage 3 unspecified: Secondary | ICD-10-CM | POA: Diagnosis not present

## 2020-06-06 DIAGNOSIS — J9620 Acute and chronic respiratory failure, unspecified whether with hypoxia or hypercapnia: Secondary | ICD-10-CM | POA: Diagnosis not present

## 2020-06-06 DIAGNOSIS — A419 Sepsis, unspecified organism: Secondary | ICD-10-CM | POA: Diagnosis not present

## 2020-06-06 DIAGNOSIS — R569 Unspecified convulsions: Secondary | ICD-10-CM | POA: Diagnosis not present

## 2020-06-06 DIAGNOSIS — E875 Hyperkalemia: Secondary | ICD-10-CM | POA: Diagnosis not present

## 2020-06-06 DIAGNOSIS — R4182 Altered mental status, unspecified: Secondary | ICD-10-CM | POA: Diagnosis not present

## 2020-06-06 DIAGNOSIS — D649 Anemia, unspecified: Secondary | ICD-10-CM | POA: Diagnosis not present

## 2020-06-06 DIAGNOSIS — J188 Other pneumonia, unspecified organism: Secondary | ICD-10-CM | POA: Diagnosis not present

## 2020-06-07 DIAGNOSIS — R4182 Altered mental status, unspecified: Secondary | ICD-10-CM | POA: Diagnosis not present

## 2020-06-07 DIAGNOSIS — J984 Other disorders of lung: Secondary | ICD-10-CM | POA: Diagnosis not present

## 2020-06-07 DIAGNOSIS — J188 Other pneumonia, unspecified organism: Secondary | ICD-10-CM | POA: Diagnosis not present

## 2020-06-07 DIAGNOSIS — J9620 Acute and chronic respiratory failure, unspecified whether with hypoxia or hypercapnia: Secondary | ICD-10-CM | POA: Diagnosis not present

## 2020-06-07 DIAGNOSIS — E875 Hyperkalemia: Secondary | ICD-10-CM | POA: Diagnosis not present

## 2020-06-07 DIAGNOSIS — I517 Cardiomegaly: Secondary | ICD-10-CM | POA: Diagnosis not present

## 2020-06-07 DIAGNOSIS — A419 Sepsis, unspecified organism: Secondary | ICD-10-CM | POA: Diagnosis not present

## 2020-06-07 DIAGNOSIS — R569 Unspecified convulsions: Secondary | ICD-10-CM | POA: Diagnosis not present

## 2020-06-07 DIAGNOSIS — D649 Anemia, unspecified: Secondary | ICD-10-CM | POA: Diagnosis not present

## 2020-06-08 DIAGNOSIS — D649 Anemia, unspecified: Secondary | ICD-10-CM | POA: Diagnosis not present

## 2020-06-08 DIAGNOSIS — R4182 Altered mental status, unspecified: Secondary | ICD-10-CM | POA: Diagnosis not present

## 2020-06-08 DIAGNOSIS — R569 Unspecified convulsions: Secondary | ICD-10-CM | POA: Diagnosis not present

## 2020-06-08 DIAGNOSIS — J9620 Acute and chronic respiratory failure, unspecified whether with hypoxia or hypercapnia: Secondary | ICD-10-CM | POA: Diagnosis not present

## 2020-06-09 DIAGNOSIS — D649 Anemia, unspecified: Secondary | ICD-10-CM | POA: Diagnosis not present

## 2020-06-09 DIAGNOSIS — R4182 Altered mental status, unspecified: Secondary | ICD-10-CM | POA: Diagnosis not present

## 2020-06-09 DIAGNOSIS — R569 Unspecified convulsions: Secondary | ICD-10-CM | POA: Diagnosis not present

## 2020-06-09 DIAGNOSIS — J9620 Acute and chronic respiratory failure, unspecified whether with hypoxia or hypercapnia: Secondary | ICD-10-CM | POA: Diagnosis not present

## 2020-06-10 DIAGNOSIS — R569 Unspecified convulsions: Secondary | ICD-10-CM | POA: Diagnosis not present

## 2020-06-10 DIAGNOSIS — D649 Anemia, unspecified: Secondary | ICD-10-CM | POA: Diagnosis not present

## 2020-06-10 DIAGNOSIS — J9620 Acute and chronic respiratory failure, unspecified whether with hypoxia or hypercapnia: Secondary | ICD-10-CM | POA: Diagnosis not present

## 2020-06-10 DIAGNOSIS — R4182 Altered mental status, unspecified: Secondary | ICD-10-CM | POA: Diagnosis not present

## 2020-06-11 DIAGNOSIS — R569 Unspecified convulsions: Secondary | ICD-10-CM | POA: Diagnosis not present

## 2020-06-11 DIAGNOSIS — D649 Anemia, unspecified: Secondary | ICD-10-CM | POA: Diagnosis not present

## 2020-06-11 DIAGNOSIS — R4182 Altered mental status, unspecified: Secondary | ICD-10-CM | POA: Diagnosis not present

## 2020-06-11 DIAGNOSIS — J9620 Acute and chronic respiratory failure, unspecified whether with hypoxia or hypercapnia: Secondary | ICD-10-CM | POA: Diagnosis not present

## 2020-06-12 DIAGNOSIS — R4182 Altered mental status, unspecified: Secondary | ICD-10-CM | POA: Diagnosis not present

## 2020-06-12 DIAGNOSIS — J189 Pneumonia, unspecified organism: Secondary | ICD-10-CM | POA: Diagnosis not present

## 2020-06-12 DIAGNOSIS — G8929 Other chronic pain: Secondary | ICD-10-CM | POA: Diagnosis not present

## 2020-06-12 DIAGNOSIS — E1122 Type 2 diabetes mellitus with diabetic chronic kidney disease: Secondary | ICD-10-CM | POA: Diagnosis not present

## 2020-06-12 DIAGNOSIS — N183 Chronic kidney disease, stage 3 unspecified: Secondary | ICD-10-CM | POA: Diagnosis not present

## 2020-06-12 DIAGNOSIS — D649 Anemia, unspecified: Secondary | ICD-10-CM | POA: Diagnosis not present

## 2020-06-12 DIAGNOSIS — J449 Chronic obstructive pulmonary disease, unspecified: Secondary | ICD-10-CM | POA: Diagnosis not present

## 2020-06-12 DIAGNOSIS — R569 Unspecified convulsions: Secondary | ICD-10-CM | POA: Diagnosis not present

## 2020-06-12 DIAGNOSIS — I10 Essential (primary) hypertension: Secondary | ICD-10-CM | POA: Diagnosis not present

## 2020-06-12 DIAGNOSIS — J9620 Acute and chronic respiratory failure, unspecified whether with hypoxia or hypercapnia: Secondary | ICD-10-CM | POA: Diagnosis not present

## 2020-06-12 DIAGNOSIS — N189 Chronic kidney disease, unspecified: Secondary | ICD-10-CM | POA: Diagnosis not present

## 2020-06-12 DIAGNOSIS — E7800 Pure hypercholesterolemia, unspecified: Secondary | ICD-10-CM | POA: Diagnosis not present

## 2020-06-13 DIAGNOSIS — D649 Anemia, unspecified: Secondary | ICD-10-CM | POA: Diagnosis not present

## 2020-06-13 DIAGNOSIS — I639 Cerebral infarction, unspecified: Secondary | ICD-10-CM | POA: Diagnosis not present

## 2020-06-13 DIAGNOSIS — J9620 Acute and chronic respiratory failure, unspecified whether with hypoxia or hypercapnia: Secondary | ICD-10-CM | POA: Diagnosis not present

## 2020-06-13 DIAGNOSIS — R4182 Altered mental status, unspecified: Secondary | ICD-10-CM | POA: Diagnosis not present

## 2020-06-13 DIAGNOSIS — R569 Unspecified convulsions: Secondary | ICD-10-CM | POA: Diagnosis not present

## 2020-06-14 DIAGNOSIS — I10 Essential (primary) hypertension: Secondary | ICD-10-CM | POA: Diagnosis not present

## 2020-06-14 DIAGNOSIS — N189 Chronic kidney disease, unspecified: Secondary | ICD-10-CM | POA: Diagnosis not present

## 2020-06-14 DIAGNOSIS — J9610 Chronic respiratory failure, unspecified whether with hypoxia or hypercapnia: Secondary | ICD-10-CM | POA: Diagnosis not present

## 2020-06-14 DIAGNOSIS — J9621 Acute and chronic respiratory failure with hypoxia: Secondary | ICD-10-CM | POA: Diagnosis not present

## 2020-06-14 DIAGNOSIS — F331 Major depressive disorder, recurrent, moderate: Secondary | ICD-10-CM | POA: Diagnosis not present

## 2020-06-14 DIAGNOSIS — Z5320 Procedure and treatment not carried out because of patient's decision for unspecified reasons: Secondary | ICD-10-CM | POA: Diagnosis not present

## 2020-06-14 DIAGNOSIS — I959 Hypotension, unspecified: Secondary | ICD-10-CM | POA: Diagnosis not present

## 2020-06-14 DIAGNOSIS — J449 Chronic obstructive pulmonary disease, unspecified: Secondary | ICD-10-CM | POA: Diagnosis not present

## 2020-06-14 DIAGNOSIS — R4020 Unspecified coma: Secondary | ICD-10-CM | POA: Diagnosis not present

## 2020-06-14 DIAGNOSIS — N184 Chronic kidney disease, stage 4 (severe): Secondary | ICD-10-CM | POA: Diagnosis not present

## 2020-06-14 DIAGNOSIS — D649 Anemia, unspecified: Secondary | ICD-10-CM | POA: Diagnosis not present

## 2020-06-14 DIAGNOSIS — Z9229 Personal history of other drug therapy: Secondary | ICD-10-CM | POA: Diagnosis not present

## 2020-06-14 DIAGNOSIS — G9340 Encephalopathy, unspecified: Secondary | ICD-10-CM | POA: Diagnosis not present

## 2020-06-14 DIAGNOSIS — E669 Obesity, unspecified: Secondary | ICD-10-CM | POA: Diagnosis not present

## 2020-06-14 DIAGNOSIS — E1169 Type 2 diabetes mellitus with other specified complication: Secondary | ICD-10-CM | POA: Diagnosis not present

## 2020-06-14 DIAGNOSIS — J95851 Ventilator associated pneumonia: Secondary | ICD-10-CM | POA: Diagnosis not present

## 2020-06-14 DIAGNOSIS — G4733 Obstructive sleep apnea (adult) (pediatric): Secondary | ICD-10-CM | POA: Diagnosis not present

## 2020-06-14 DIAGNOSIS — R000 Tachycardia, unspecified: Secondary | ICD-10-CM | POA: Diagnosis not present

## 2020-06-14 DIAGNOSIS — E1122 Type 2 diabetes mellitus with diabetic chronic kidney disease: Secondary | ICD-10-CM | POA: Diagnosis not present

## 2020-06-14 DIAGNOSIS — F0631 Mood disorder due to known physiological condition with depressive features: Secondary | ICD-10-CM | POA: Diagnosis not present

## 2020-06-14 DIAGNOSIS — G9349 Other encephalopathy: Secondary | ICD-10-CM | POA: Diagnosis not present

## 2020-06-14 DIAGNOSIS — R404 Transient alteration of awareness: Secondary | ICD-10-CM | POA: Diagnosis not present

## 2020-06-14 DIAGNOSIS — Z7189 Other specified counseling: Secondary | ICD-10-CM | POA: Diagnosis not present

## 2020-06-14 DIAGNOSIS — R4182 Altered mental status, unspecified: Secondary | ICD-10-CM | POA: Diagnosis not present

## 2020-06-14 DIAGNOSIS — G8929 Other chronic pain: Secondary | ICD-10-CM | POA: Diagnosis not present

## 2020-06-14 DIAGNOSIS — Z20822 Contact with and (suspected) exposure to covid-19: Secondary | ICD-10-CM | POA: Diagnosis not present

## 2020-06-14 DIAGNOSIS — Z9911 Dependence on respirator [ventilator] status: Secondary | ICD-10-CM | POA: Diagnosis not present

## 2020-06-14 DIAGNOSIS — R569 Unspecified convulsions: Secondary | ICD-10-CM | POA: Diagnosis not present

## 2020-06-14 DIAGNOSIS — F411 Generalized anxiety disorder: Secondary | ICD-10-CM | POA: Diagnosis not present

## 2020-06-14 DIAGNOSIS — A419 Sepsis, unspecified organism: Secondary | ICD-10-CM | POA: Diagnosis not present

## 2020-06-14 DIAGNOSIS — G931 Anoxic brain damage, not elsewhere classified: Secondary | ICD-10-CM | POA: Diagnosis not present

## 2020-06-14 DIAGNOSIS — J9620 Acute and chronic respiratory failure, unspecified whether with hypoxia or hypercapnia: Secondary | ICD-10-CM | POA: Diagnosis not present

## 2020-06-16 DIAGNOSIS — N184 Chronic kidney disease, stage 4 (severe): Secondary | ICD-10-CM | POA: Diagnosis not present

## 2020-06-16 DIAGNOSIS — G4733 Obstructive sleep apnea (adult) (pediatric): Secondary | ICD-10-CM | POA: Diagnosis not present

## 2020-06-16 DIAGNOSIS — F331 Major depressive disorder, recurrent, moderate: Secondary | ICD-10-CM | POA: Diagnosis not present

## 2020-06-16 DIAGNOSIS — F0631 Mood disorder due to known physiological condition with depressive features: Secondary | ICD-10-CM | POA: Diagnosis not present

## 2020-06-16 DIAGNOSIS — Z20822 Contact with and (suspected) exposure to covid-19: Secondary | ICD-10-CM | POA: Diagnosis not present

## 2020-06-16 DIAGNOSIS — I10 Essential (primary) hypertension: Secondary | ICD-10-CM | POA: Diagnosis not present

## 2020-06-16 DIAGNOSIS — J449 Chronic obstructive pulmonary disease, unspecified: Secondary | ICD-10-CM | POA: Diagnosis not present

## 2020-06-16 DIAGNOSIS — J9621 Acute and chronic respiratory failure with hypoxia: Secondary | ICD-10-CM | POA: Diagnosis not present

## 2020-06-16 DIAGNOSIS — G931 Anoxic brain damage, not elsewhere classified: Secondary | ICD-10-CM | POA: Diagnosis not present

## 2020-06-16 DIAGNOSIS — F411 Generalized anxiety disorder: Secondary | ICD-10-CM | POA: Diagnosis not present

## 2020-06-16 DIAGNOSIS — N189 Chronic kidney disease, unspecified: Secondary | ICD-10-CM | POA: Diagnosis not present

## 2020-06-16 DIAGNOSIS — R000 Tachycardia, unspecified: Secondary | ICD-10-CM | POA: Diagnosis not present

## 2020-06-16 DIAGNOSIS — E1169 Type 2 diabetes mellitus with other specified complication: Secondary | ICD-10-CM | POA: Diagnosis not present

## 2020-06-18 DIAGNOSIS — G9349 Other encephalopathy: Secondary | ICD-10-CM | POA: Diagnosis not present

## 2020-06-18 DIAGNOSIS — Z20822 Contact with and (suspected) exposure to covid-19: Secondary | ICD-10-CM | POA: Diagnosis not present

## 2020-06-19 DIAGNOSIS — F331 Major depressive disorder, recurrent, moderate: Secondary | ICD-10-CM | POA: Diagnosis not present

## 2020-06-19 DIAGNOSIS — F411 Generalized anxiety disorder: Secondary | ICD-10-CM | POA: Diagnosis not present

## 2020-06-19 DIAGNOSIS — G9340 Encephalopathy, unspecified: Secondary | ICD-10-CM | POA: Diagnosis not present

## 2020-06-19 DIAGNOSIS — J9610 Chronic respiratory failure, unspecified whether with hypoxia or hypercapnia: Secondary | ICD-10-CM | POA: Diagnosis not present

## 2020-06-19 DIAGNOSIS — F0631 Mood disorder due to known physiological condition with depressive features: Secondary | ICD-10-CM | POA: Diagnosis not present

## 2020-06-19 DIAGNOSIS — G931 Anoxic brain damage, not elsewhere classified: Secondary | ICD-10-CM | POA: Diagnosis not present

## 2020-06-19 DIAGNOSIS — R000 Tachycardia, unspecified: Secondary | ICD-10-CM | POA: Diagnosis not present

## 2020-06-19 DIAGNOSIS — E1169 Type 2 diabetes mellitus with other specified complication: Secondary | ICD-10-CM | POA: Diagnosis not present

## 2020-06-21 DIAGNOSIS — Z20822 Contact with and (suspected) exposure to covid-19: Secondary | ICD-10-CM | POA: Diagnosis not present

## 2020-06-25 DIAGNOSIS — Z9229 Personal history of other drug therapy: Secondary | ICD-10-CM | POA: Diagnosis not present

## 2020-06-25 DIAGNOSIS — Z20822 Contact with and (suspected) exposure to covid-19: Secondary | ICD-10-CM | POA: Diagnosis not present

## 2020-06-25 DIAGNOSIS — Z9911 Dependence on respirator [ventilator] status: Secondary | ICD-10-CM | POA: Diagnosis not present

## 2020-06-26 DIAGNOSIS — G9349 Other encephalopathy: Secondary | ICD-10-CM | POA: Diagnosis not present

## 2020-06-26 DIAGNOSIS — J9610 Chronic respiratory failure, unspecified whether with hypoxia or hypercapnia: Secondary | ICD-10-CM | POA: Diagnosis not present

## 2020-07-03 DIAGNOSIS — F411 Generalized anxiety disorder: Secondary | ICD-10-CM | POA: Diagnosis not present

## 2020-07-03 DIAGNOSIS — F0631 Mood disorder due to known physiological condition with depressive features: Secondary | ICD-10-CM | POA: Diagnosis not present

## 2020-07-03 DIAGNOSIS — F331 Major depressive disorder, recurrent, moderate: Secondary | ICD-10-CM | POA: Diagnosis not present

## 2020-07-09 DIAGNOSIS — Z20822 Contact with and (suspected) exposure to covid-19: Secondary | ICD-10-CM | POA: Diagnosis not present

## 2020-07-09 DIAGNOSIS — G8929 Other chronic pain: Secondary | ICD-10-CM | POA: Diagnosis not present

## 2020-07-10 DIAGNOSIS — F331 Major depressive disorder, recurrent, moderate: Secondary | ICD-10-CM | POA: Diagnosis not present

## 2020-07-10 DIAGNOSIS — F411 Generalized anxiety disorder: Secondary | ICD-10-CM | POA: Diagnosis not present

## 2020-07-10 DIAGNOSIS — F0631 Mood disorder due to known physiological condition with depressive features: Secondary | ICD-10-CM | POA: Diagnosis not present

## 2020-07-13 DIAGNOSIS — Z20822 Contact with and (suspected) exposure to covid-19: Secondary | ICD-10-CM | POA: Diagnosis not present

## 2020-07-13 DIAGNOSIS — Z7189 Other specified counseling: Secondary | ICD-10-CM | POA: Diagnosis not present

## 2020-07-13 DIAGNOSIS — Z9229 Personal history of other drug therapy: Secondary | ICD-10-CM | POA: Diagnosis not present

## 2020-07-16 DIAGNOSIS — Z9911 Dependence on respirator [ventilator] status: Secondary | ICD-10-CM | POA: Diagnosis not present

## 2020-07-16 DIAGNOSIS — G931 Anoxic brain damage, not elsewhere classified: Secondary | ICD-10-CM | POA: Diagnosis not present

## 2020-07-16 DIAGNOSIS — J9611 Chronic respiratory failure with hypoxia: Secondary | ICD-10-CM | POA: Diagnosis not present

## 2020-07-16 DIAGNOSIS — Z20822 Contact with and (suspected) exposure to covid-19: Secondary | ICD-10-CM | POA: Diagnosis not present

## 2020-07-17 DIAGNOSIS — F0631 Mood disorder due to known physiological condition with depressive features: Secondary | ICD-10-CM | POA: Diagnosis not present

## 2020-07-17 DIAGNOSIS — E1169 Type 2 diabetes mellitus with other specified complication: Secondary | ICD-10-CM | POA: Diagnosis not present

## 2020-07-17 DIAGNOSIS — Z9911 Dependence on respirator [ventilator] status: Secondary | ICD-10-CM | POA: Diagnosis not present

## 2020-07-17 DIAGNOSIS — G4733 Obstructive sleep apnea (adult) (pediatric): Secondary | ICD-10-CM | POA: Diagnosis not present

## 2020-07-17 DIAGNOSIS — Z20822 Contact with and (suspected) exposure to covid-19: Secondary | ICD-10-CM | POA: Diagnosis not present

## 2020-07-17 DIAGNOSIS — Z930 Tracheostomy status: Secondary | ICD-10-CM | POA: Diagnosis not present

## 2020-07-17 DIAGNOSIS — J9610 Chronic respiratory failure, unspecified whether with hypoxia or hypercapnia: Secondary | ICD-10-CM | POA: Diagnosis not present

## 2020-07-17 DIAGNOSIS — Z9229 Personal history of other drug therapy: Secondary | ICD-10-CM | POA: Diagnosis not present

## 2020-07-17 DIAGNOSIS — N189 Chronic kidney disease, unspecified: Secondary | ICD-10-CM | POA: Diagnosis not present

## 2020-07-17 DIAGNOSIS — E119 Type 2 diabetes mellitus without complications: Secondary | ICD-10-CM | POA: Diagnosis not present

## 2020-07-17 DIAGNOSIS — F331 Major depressive disorder, recurrent, moderate: Secondary | ICD-10-CM | POA: Diagnosis not present

## 2020-07-17 DIAGNOSIS — E1165 Type 2 diabetes mellitus with hyperglycemia: Secondary | ICD-10-CM | POA: Diagnosis not present

## 2020-07-17 DIAGNOSIS — I10 Essential (primary) hypertension: Secondary | ICD-10-CM | POA: Diagnosis not present

## 2020-07-17 DIAGNOSIS — Z79899 Other long term (current) drug therapy: Secondary | ICD-10-CM | POA: Diagnosis not present

## 2020-07-17 DIAGNOSIS — G931 Anoxic brain damage, not elsewhere classified: Secondary | ICD-10-CM | POA: Diagnosis not present

## 2020-07-17 DIAGNOSIS — J449 Chronic obstructive pulmonary disease, unspecified: Secondary | ICD-10-CM | POA: Diagnosis not present

## 2020-07-17 DIAGNOSIS — N184 Chronic kidney disease, stage 4 (severe): Secondary | ICD-10-CM | POA: Diagnosis not present

## 2020-07-17 DIAGNOSIS — Z7189 Other specified counseling: Secondary | ICD-10-CM | POA: Diagnosis not present

## 2020-07-17 DIAGNOSIS — J9621 Acute and chronic respiratory failure with hypoxia: Secondary | ICD-10-CM | POA: Diagnosis not present

## 2020-07-17 DIAGNOSIS — F411 Generalized anxiety disorder: Secondary | ICD-10-CM | POA: Diagnosis not present

## 2020-07-17 DIAGNOSIS — E785 Hyperlipidemia, unspecified: Secondary | ICD-10-CM | POA: Diagnosis not present

## 2020-07-18 DIAGNOSIS — Z7189 Other specified counseling: Secondary | ICD-10-CM | POA: Diagnosis not present

## 2020-07-18 DIAGNOSIS — E119 Type 2 diabetes mellitus without complications: Secondary | ICD-10-CM | POA: Diagnosis not present

## 2020-07-18 DIAGNOSIS — Z9911 Dependence on respirator [ventilator] status: Secondary | ICD-10-CM | POA: Diagnosis not present

## 2020-07-18 DIAGNOSIS — Z9229 Personal history of other drug therapy: Secondary | ICD-10-CM | POA: Diagnosis not present

## 2020-07-18 DIAGNOSIS — Z20822 Contact with and (suspected) exposure to covid-19: Secondary | ICD-10-CM | POA: Diagnosis not present

## 2020-07-23 DIAGNOSIS — Z9911 Dependence on respirator [ventilator] status: Secondary | ICD-10-CM | POA: Diagnosis not present

## 2020-07-23 DIAGNOSIS — E1165 Type 2 diabetes mellitus with hyperglycemia: Secondary | ICD-10-CM | POA: Diagnosis not present

## 2020-07-31 DIAGNOSIS — F411 Generalized anxiety disorder: Secondary | ICD-10-CM | POA: Diagnosis not present

## 2020-07-31 DIAGNOSIS — F0631 Mood disorder due to known physiological condition with depressive features: Secondary | ICD-10-CM | POA: Diagnosis not present

## 2020-07-31 DIAGNOSIS — F331 Major depressive disorder, recurrent, moderate: Secondary | ICD-10-CM | POA: Diagnosis not present

## 2020-08-03 DIAGNOSIS — Z79899 Other long term (current) drug therapy: Secondary | ICD-10-CM | POA: Diagnosis not present

## 2020-08-03 DIAGNOSIS — E785 Hyperlipidemia, unspecified: Secondary | ICD-10-CM | POA: Diagnosis not present

## 2020-08-10 DIAGNOSIS — Z9911 Dependence on respirator [ventilator] status: Secondary | ICD-10-CM | POA: Diagnosis not present

## 2020-08-10 DIAGNOSIS — G931 Anoxic brain damage, not elsewhere classified: Secondary | ICD-10-CM | POA: Diagnosis not present

## 2020-08-10 DIAGNOSIS — J9610 Chronic respiratory failure, unspecified whether with hypoxia or hypercapnia: Secondary | ICD-10-CM | POA: Diagnosis not present

## 2020-08-10 DIAGNOSIS — E1169 Type 2 diabetes mellitus with other specified complication: Secondary | ICD-10-CM | POA: Diagnosis not present

## 2020-08-13 DIAGNOSIS — N898 Other specified noninflammatory disorders of vagina: Secondary | ICD-10-CM | POA: Diagnosis not present

## 2020-08-14 DIAGNOSIS — F411 Generalized anxiety disorder: Secondary | ICD-10-CM | POA: Diagnosis not present

## 2020-08-14 DIAGNOSIS — R8281 Pyuria: Secondary | ICD-10-CM | POA: Diagnosis not present

## 2020-08-14 DIAGNOSIS — Z79899 Other long term (current) drug therapy: Secondary | ICD-10-CM | POA: Diagnosis not present

## 2020-08-14 DIAGNOSIS — F0631 Mood disorder due to known physiological condition with depressive features: Secondary | ICD-10-CM | POA: Diagnosis not present

## 2020-08-14 DIAGNOSIS — G931 Anoxic brain damage, not elsewhere classified: Secondary | ICD-10-CM | POA: Diagnosis not present

## 2020-08-14 DIAGNOSIS — E1169 Type 2 diabetes mellitus with other specified complication: Secondary | ICD-10-CM | POA: Diagnosis not present

## 2020-08-14 DIAGNOSIS — R6889 Other general symptoms and signs: Secondary | ICD-10-CM | POA: Diagnosis not present

## 2020-08-14 DIAGNOSIS — J9621 Acute and chronic respiratory failure with hypoxia: Secondary | ICD-10-CM | POA: Diagnosis not present

## 2020-08-14 DIAGNOSIS — Z9911 Dependence on respirator [ventilator] status: Secondary | ICD-10-CM | POA: Diagnosis not present

## 2020-08-14 DIAGNOSIS — J9611 Chronic respiratory failure with hypoxia: Secondary | ICD-10-CM | POA: Diagnosis not present

## 2020-08-14 DIAGNOSIS — F32A Depression, unspecified: Secondary | ICD-10-CM | POA: Diagnosis not present

## 2020-08-14 DIAGNOSIS — N319 Neuromuscular dysfunction of bladder, unspecified: Secondary | ICD-10-CM | POA: Diagnosis not present

## 2020-08-14 DIAGNOSIS — R879 Unspecified abnormal finding in specimens from female genital organs: Secondary | ICD-10-CM | POA: Diagnosis not present

## 2020-08-14 DIAGNOSIS — N390 Urinary tract infection, site not specified: Secondary | ICD-10-CM | POA: Diagnosis not present

## 2020-08-14 DIAGNOSIS — F331 Major depressive disorder, recurrent, moderate: Secondary | ICD-10-CM | POA: Diagnosis not present

## 2020-08-17 DIAGNOSIS — R8281 Pyuria: Secondary | ICD-10-CM | POA: Diagnosis not present

## 2020-08-17 DIAGNOSIS — N390 Urinary tract infection, site not specified: Secondary | ICD-10-CM | POA: Diagnosis not present

## 2020-08-20 DIAGNOSIS — N390 Urinary tract infection, site not specified: Secondary | ICD-10-CM | POA: Diagnosis not present

## 2020-08-21 DIAGNOSIS — F411 Generalized anxiety disorder: Secondary | ICD-10-CM | POA: Diagnosis not present

## 2020-08-21 DIAGNOSIS — F331 Major depressive disorder, recurrent, moderate: Secondary | ICD-10-CM | POA: Diagnosis not present

## 2020-08-21 DIAGNOSIS — F0631 Mood disorder due to known physiological condition with depressive features: Secondary | ICD-10-CM | POA: Diagnosis not present

## 2020-08-28 DIAGNOSIS — E1169 Type 2 diabetes mellitus with other specified complication: Secondary | ICD-10-CM | POA: Diagnosis not present

## 2020-08-28 DIAGNOSIS — G931 Anoxic brain damage, not elsewhere classified: Secondary | ICD-10-CM | POA: Diagnosis not present

## 2020-08-28 DIAGNOSIS — Z9911 Dependence on respirator [ventilator] status: Secondary | ICD-10-CM | POA: Diagnosis not present

## 2020-09-03 DIAGNOSIS — N319 Neuromuscular dysfunction of bladder, unspecified: Secondary | ICD-10-CM | POA: Diagnosis not present

## 2020-09-03 DIAGNOSIS — F32A Depression, unspecified: Secondary | ICD-10-CM | POA: Diagnosis not present

## 2020-09-03 DIAGNOSIS — Z79899 Other long term (current) drug therapy: Secondary | ICD-10-CM | POA: Diagnosis not present

## 2020-09-10 DIAGNOSIS — R879 Unspecified abnormal finding in specimens from female genital organs: Secondary | ICD-10-CM | POA: Diagnosis not present

## 2020-09-10 DIAGNOSIS — R6889 Other general symptoms and signs: Secondary | ICD-10-CM | POA: Diagnosis not present

## 2020-09-11 DIAGNOSIS — Z9911 Dependence on respirator [ventilator] status: Secondary | ICD-10-CM | POA: Diagnosis not present

## 2020-09-11 DIAGNOSIS — G931 Anoxic brain damage, not elsewhere classified: Secondary | ICD-10-CM | POA: Diagnosis not present

## 2020-09-11 DIAGNOSIS — J9611 Chronic respiratory failure with hypoxia: Secondary | ICD-10-CM | POA: Diagnosis not present

## 2020-09-17 DIAGNOSIS — Z930 Tracheostomy status: Secondary | ICD-10-CM | POA: Diagnosis not present

## 2020-09-21 DIAGNOSIS — H5789 Other specified disorders of eye and adnexa: Secondary | ICD-10-CM | POA: Diagnosis not present

## 2020-09-21 DIAGNOSIS — J9610 Chronic respiratory failure, unspecified whether with hypoxia or hypercapnia: Secondary | ICD-10-CM | POA: Diagnosis not present

## 2020-09-21 DIAGNOSIS — G931 Anoxic brain damage, not elsewhere classified: Secondary | ICD-10-CM | POA: Diagnosis not present

## 2020-09-21 DIAGNOSIS — Z9911 Dependence on respirator [ventilator] status: Secondary | ICD-10-CM | POA: Diagnosis not present

## 2020-09-25 DIAGNOSIS — F0631 Mood disorder due to known physiological condition with depressive features: Secondary | ICD-10-CM | POA: Diagnosis not present

## 2020-09-25 DIAGNOSIS — F331 Major depressive disorder, recurrent, moderate: Secondary | ICD-10-CM | POA: Diagnosis not present

## 2020-09-25 DIAGNOSIS — F411 Generalized anxiety disorder: Secondary | ICD-10-CM | POA: Diagnosis not present

## 2020-09-26 DIAGNOSIS — N390 Urinary tract infection, site not specified: Secondary | ICD-10-CM | POA: Diagnosis not present

## 2020-09-26 DIAGNOSIS — H5789 Other specified disorders of eye and adnexa: Secondary | ICD-10-CM | POA: Diagnosis not present

## 2020-09-26 DIAGNOSIS — Z79899 Other long term (current) drug therapy: Secondary | ICD-10-CM | POA: Diagnosis not present

## 2020-09-26 DIAGNOSIS — R6889 Other general symptoms and signs: Secondary | ICD-10-CM | POA: Diagnosis not present

## 2020-09-26 DIAGNOSIS — G931 Anoxic brain damage, not elsewhere classified: Secondary | ICD-10-CM | POA: Diagnosis not present

## 2020-09-26 DIAGNOSIS — Z9911 Dependence on respirator [ventilator] status: Secondary | ICD-10-CM | POA: Diagnosis not present

## 2020-09-27 DIAGNOSIS — Z930 Tracheostomy status: Secondary | ICD-10-CM | POA: Diagnosis not present

## 2020-09-27 DIAGNOSIS — N760 Acute vaginitis: Secondary | ICD-10-CM | POA: Diagnosis not present

## 2020-09-27 DIAGNOSIS — D649 Anemia, unspecified: Secondary | ICD-10-CM | POA: Diagnosis not present

## 2020-09-27 DIAGNOSIS — Z4659 Encounter for fitting and adjustment of other gastrointestinal appliance and device: Secondary | ICD-10-CM | POA: Diagnosis not present

## 2020-09-27 DIAGNOSIS — R404 Transient alteration of awareness: Secondary | ICD-10-CM | POA: Diagnosis not present

## 2020-09-27 DIAGNOSIS — K5900 Constipation, unspecified: Secondary | ICD-10-CM | POA: Diagnosis not present

## 2020-09-27 DIAGNOSIS — R195 Other fecal abnormalities: Secondary | ICD-10-CM | POA: Diagnosis not present

## 2020-09-27 DIAGNOSIS — J449 Chronic obstructive pulmonary disease, unspecified: Secondary | ICD-10-CM | POA: Diagnosis not present

## 2020-09-27 DIAGNOSIS — Z431 Encounter for attention to gastrostomy: Secondary | ICD-10-CM | POA: Diagnosis not present

## 2020-09-27 DIAGNOSIS — R933 Abnormal findings on diagnostic imaging of other parts of digestive tract: Secondary | ICD-10-CM | POA: Diagnosis not present

## 2020-09-27 DIAGNOSIS — G931 Anoxic brain damage, not elsewhere classified: Secondary | ICD-10-CM | POA: Diagnosis not present

## 2020-09-27 DIAGNOSIS — J440 Chronic obstructive pulmonary disease with acute lower respiratory infection: Secondary | ICD-10-CM | POA: Diagnosis not present

## 2020-09-27 DIAGNOSIS — Z79899 Other long term (current) drug therapy: Secondary | ICD-10-CM | POA: Diagnosis not present

## 2020-09-27 DIAGNOSIS — E119 Type 2 diabetes mellitus without complications: Secondary | ICD-10-CM | POA: Diagnosis not present

## 2020-09-27 DIAGNOSIS — R935 Abnormal findings on diagnostic imaging of other abdominal regions, including retroperitoneum: Secondary | ICD-10-CM | POA: Diagnosis not present

## 2020-09-27 DIAGNOSIS — J156 Pneumonia due to other aerobic Gram-negative bacteria: Secondary | ICD-10-CM | POA: Diagnosis not present

## 2020-09-27 DIAGNOSIS — I959 Hypotension, unspecified: Secondary | ICD-10-CM | POA: Diagnosis not present

## 2020-09-27 DIAGNOSIS — J189 Pneumonia, unspecified organism: Secondary | ICD-10-CM | POA: Diagnosis not present

## 2020-09-27 DIAGNOSIS — Z9911 Dependence on respirator [ventilator] status: Secondary | ICD-10-CM | POA: Diagnosis not present

## 2020-09-27 DIAGNOSIS — T85528A Displacement of other gastrointestinal prosthetic devices, implants and grafts, initial encounter: Secondary | ICD-10-CM | POA: Diagnosis not present

## 2020-09-27 DIAGNOSIS — N390 Urinary tract infection, site not specified: Secondary | ICD-10-CM | POA: Diagnosis not present

## 2020-09-27 DIAGNOSIS — D509 Iron deficiency anemia, unspecified: Secondary | ICD-10-CM | POA: Diagnosis not present

## 2020-09-27 DIAGNOSIS — D638 Anemia in other chronic diseases classified elsewhere: Secondary | ICD-10-CM | POA: Diagnosis not present

## 2020-09-27 DIAGNOSIS — Z136 Encounter for screening for cardiovascular disorders: Secondary | ICD-10-CM | POA: Diagnosis not present

## 2020-09-27 DIAGNOSIS — I129 Hypertensive chronic kidney disease with stage 1 through stage 4 chronic kidney disease, or unspecified chronic kidney disease: Secondary | ICD-10-CM | POA: Diagnosis not present

## 2020-09-27 DIAGNOSIS — N898 Other specified noninflammatory disorders of vagina: Secondary | ICD-10-CM | POA: Diagnosis not present

## 2020-09-27 DIAGNOSIS — J9611 Chronic respiratory failure with hypoxia: Secondary | ICD-10-CM | POA: Diagnosis not present

## 2020-09-27 DIAGNOSIS — J9610 Chronic respiratory failure, unspecified whether with hypoxia or hypercapnia: Secondary | ICD-10-CM | POA: Diagnosis not present

## 2020-09-27 DIAGNOSIS — T83511A Infection and inflammatory reaction due to indwelling urethral catheter, initial encounter: Secondary | ICD-10-CM | POA: Diagnosis not present

## 2020-09-27 DIAGNOSIS — M62059 Separation of muscle (nontraumatic), unspecified thigh: Secondary | ICD-10-CM | POA: Diagnosis not present

## 2020-09-27 DIAGNOSIS — K5730 Diverticulosis of large intestine without perforation or abscess without bleeding: Secondary | ICD-10-CM | POA: Diagnosis not present

## 2020-09-27 DIAGNOSIS — K9423 Gastrostomy malfunction: Secondary | ICD-10-CM | POA: Diagnosis not present

## 2020-10-05 DIAGNOSIS — D72829 Elevated white blood cell count, unspecified: Secondary | ICD-10-CM | POA: Diagnosis not present

## 2020-10-05 DIAGNOSIS — E875 Hyperkalemia: Secondary | ICD-10-CM | POA: Diagnosis not present

## 2020-10-08 DIAGNOSIS — E875 Hyperkalemia: Secondary | ICD-10-CM | POA: Diagnosis not present

## 2020-10-08 DIAGNOSIS — Z79899 Other long term (current) drug therapy: Secondary | ICD-10-CM | POA: Diagnosis not present

## 2020-10-08 DIAGNOSIS — R403 Persistent vegetative state: Secondary | ICD-10-CM | POA: Diagnosis not present

## 2020-10-08 DIAGNOSIS — N898 Other specified noninflammatory disorders of vagina: Secondary | ICD-10-CM | POA: Diagnosis not present

## 2020-10-10 DIAGNOSIS — F32A Depression, unspecified: Secondary | ICD-10-CM | POA: Diagnosis not present

## 2020-10-11 DIAGNOSIS — L89153 Pressure ulcer of sacral region, stage 3: Secondary | ICD-10-CM | POA: Diagnosis not present

## 2020-10-11 DIAGNOSIS — D649 Anemia, unspecified: Secondary | ICD-10-CM | POA: Diagnosis not present

## 2020-10-11 DIAGNOSIS — R403 Persistent vegetative state: Secondary | ICD-10-CM | POA: Diagnosis not present

## 2020-10-11 DIAGNOSIS — N390 Urinary tract infection, site not specified: Secondary | ICD-10-CM | POA: Diagnosis not present

## 2020-10-11 DIAGNOSIS — J95851 Ventilator associated pneumonia: Secondary | ICD-10-CM | POA: Diagnosis not present

## 2020-10-12 DIAGNOSIS — J9611 Chronic respiratory failure with hypoxia: Secondary | ICD-10-CM | POA: Diagnosis not present

## 2020-10-12 DIAGNOSIS — Z9911 Dependence on respirator [ventilator] status: Secondary | ICD-10-CM | POA: Diagnosis not present

## 2020-10-12 DIAGNOSIS — G931 Anoxic brain damage, not elsewhere classified: Secondary | ICD-10-CM | POA: Diagnosis not present

## 2020-10-14 DIAGNOSIS — R093 Abnormal sputum: Secondary | ICD-10-CM | POA: Diagnosis not present

## 2020-10-14 DIAGNOSIS — J449 Chronic obstructive pulmonary disease, unspecified: Secondary | ICD-10-CM | POA: Diagnosis not present

## 2020-10-14 DIAGNOSIS — I10 Essential (primary) hypertension: Secondary | ICD-10-CM | POA: Diagnosis not present

## 2020-10-14 DIAGNOSIS — N189 Chronic kidney disease, unspecified: Secondary | ICD-10-CM | POA: Diagnosis not present

## 2020-10-14 DIAGNOSIS — J9611 Chronic respiratory failure with hypoxia: Secondary | ICD-10-CM | POA: Diagnosis not present

## 2020-10-14 DIAGNOSIS — E119 Type 2 diabetes mellitus without complications: Secondary | ICD-10-CM | POA: Diagnosis not present

## 2020-10-14 DIAGNOSIS — R601 Generalized edema: Secondary | ICD-10-CM | POA: Diagnosis not present

## 2020-10-14 DIAGNOSIS — E1165 Type 2 diabetes mellitus with hyperglycemia: Secondary | ICD-10-CM | POA: Diagnosis not present

## 2020-10-14 DIAGNOSIS — R509 Fever, unspecified: Secondary | ICD-10-CM | POA: Diagnosis not present

## 2020-10-14 DIAGNOSIS — G4733 Obstructive sleep apnea (adult) (pediatric): Secondary | ICD-10-CM | POA: Diagnosis not present

## 2020-10-14 DIAGNOSIS — J9610 Chronic respiratory failure, unspecified whether with hypoxia or hypercapnia: Secondary | ICD-10-CM | POA: Diagnosis not present

## 2020-10-14 DIAGNOSIS — N319 Neuromuscular dysfunction of bladder, unspecified: Secondary | ICD-10-CM | POA: Diagnosis not present

## 2020-10-14 DIAGNOSIS — E1169 Type 2 diabetes mellitus with other specified complication: Secondary | ICD-10-CM | POA: Diagnosis not present

## 2020-10-14 DIAGNOSIS — G931 Anoxic brain damage, not elsewhere classified: Secondary | ICD-10-CM | POA: Diagnosis not present

## 2020-10-14 DIAGNOSIS — N184 Chronic kidney disease, stage 4 (severe): Secondary | ICD-10-CM | POA: Diagnosis not present

## 2020-10-14 DIAGNOSIS — J95851 Ventilator associated pneumonia: Secondary | ICD-10-CM | POA: Diagnosis not present

## 2020-10-14 DIAGNOSIS — R6889 Other general symptoms and signs: Secondary | ICD-10-CM | POA: Diagnosis not present

## 2020-10-14 DIAGNOSIS — Z9911 Dependence on respirator [ventilator] status: Secondary | ICD-10-CM | POA: Diagnosis not present

## 2020-10-14 DIAGNOSIS — J9621 Acute and chronic respiratory failure with hypoxia: Secondary | ICD-10-CM | POA: Diagnosis not present

## 2020-10-14 DIAGNOSIS — Z930 Tracheostomy status: Secondary | ICD-10-CM | POA: Diagnosis not present

## 2020-10-14 DIAGNOSIS — L89153 Pressure ulcer of sacral region, stage 3: Secondary | ICD-10-CM | POA: Diagnosis not present

## 2020-10-14 DIAGNOSIS — Z79899 Other long term (current) drug therapy: Secondary | ICD-10-CM | POA: Diagnosis not present

## 2020-10-15 DIAGNOSIS — Z930 Tracheostomy status: Secondary | ICD-10-CM | POA: Diagnosis not present

## 2020-10-15 DIAGNOSIS — N319 Neuromuscular dysfunction of bladder, unspecified: Secondary | ICD-10-CM | POA: Diagnosis not present

## 2020-10-15 DIAGNOSIS — L89153 Pressure ulcer of sacral region, stage 3: Secondary | ICD-10-CM | POA: Diagnosis not present

## 2020-10-23 DIAGNOSIS — Z79899 Other long term (current) drug therapy: Secondary | ICD-10-CM | POA: Diagnosis not present

## 2020-10-23 DIAGNOSIS — E119 Type 2 diabetes mellitus without complications: Secondary | ICD-10-CM | POA: Diagnosis not present

## 2020-10-24 DIAGNOSIS — J9610 Chronic respiratory failure, unspecified whether with hypoxia or hypercapnia: Secondary | ICD-10-CM | POA: Diagnosis not present

## 2020-10-24 DIAGNOSIS — Z79899 Other long term (current) drug therapy: Secondary | ICD-10-CM | POA: Diagnosis not present

## 2020-10-24 DIAGNOSIS — I10 Essential (primary) hypertension: Secondary | ICD-10-CM | POA: Diagnosis not present

## 2020-10-24 DIAGNOSIS — E1165 Type 2 diabetes mellitus with hyperglycemia: Secondary | ICD-10-CM | POA: Diagnosis not present

## 2020-10-24 DIAGNOSIS — E119 Type 2 diabetes mellitus without complications: Secondary | ICD-10-CM | POA: Diagnosis not present

## 2020-10-25 DIAGNOSIS — L89153 Pressure ulcer of sacral region, stage 3: Secondary | ICD-10-CM | POA: Diagnosis not present

## 2020-11-01 DIAGNOSIS — L89153 Pressure ulcer of sacral region, stage 3: Secondary | ICD-10-CM | POA: Diagnosis not present

## 2020-11-02 DIAGNOSIS — R601 Generalized edema: Secondary | ICD-10-CM | POA: Diagnosis not present

## 2020-11-07 DIAGNOSIS — Z9911 Dependence on respirator [ventilator] status: Secondary | ICD-10-CM | POA: Diagnosis not present

## 2020-11-07 DIAGNOSIS — G931 Anoxic brain damage, not elsewhere classified: Secondary | ICD-10-CM | POA: Diagnosis not present

## 2020-11-07 DIAGNOSIS — R509 Fever, unspecified: Secondary | ICD-10-CM | POA: Diagnosis not present

## 2020-11-07 DIAGNOSIS — J9610 Chronic respiratory failure, unspecified whether with hypoxia or hypercapnia: Secondary | ICD-10-CM | POA: Diagnosis not present

## 2020-11-07 DIAGNOSIS — R6889 Other general symptoms and signs: Secondary | ICD-10-CM | POA: Diagnosis not present

## 2020-11-07 DIAGNOSIS — R093 Abnormal sputum: Secondary | ICD-10-CM | POA: Diagnosis not present

## 2020-11-08 DIAGNOSIS — L89153 Pressure ulcer of sacral region, stage 3: Secondary | ICD-10-CM | POA: Diagnosis not present

## 2020-11-09 DIAGNOSIS — Z9911 Dependence on respirator [ventilator] status: Secondary | ICD-10-CM | POA: Diagnosis not present

## 2020-11-09 DIAGNOSIS — J9611 Chronic respiratory failure with hypoxia: Secondary | ICD-10-CM | POA: Diagnosis not present

## 2020-11-09 DIAGNOSIS — G931 Anoxic brain damage, not elsewhere classified: Secondary | ICD-10-CM | POA: Diagnosis not present

## 2020-11-09 DIAGNOSIS — J9610 Chronic respiratory failure, unspecified whether with hypoxia or hypercapnia: Secondary | ICD-10-CM | POA: Diagnosis not present

## 2020-11-09 DIAGNOSIS — J95851 Ventilator associated pneumonia: Secondary | ICD-10-CM | POA: Diagnosis not present

## 2020-11-13 DIAGNOSIS — L89153 Pressure ulcer of sacral region, stage 3: Secondary | ICD-10-CM | POA: Diagnosis not present

## 2020-11-13 DIAGNOSIS — Z930 Tracheostomy status: Secondary | ICD-10-CM | POA: Diagnosis not present

## 2020-11-13 DIAGNOSIS — N319 Neuromuscular dysfunction of bladder, unspecified: Secondary | ICD-10-CM | POA: Diagnosis not present

## 2020-11-14 DIAGNOSIS — L89153 Pressure ulcer of sacral region, stage 3: Secondary | ICD-10-CM | POA: Diagnosis not present

## 2020-11-14 DIAGNOSIS — B86 Scabies: Secondary | ICD-10-CM | POA: Diagnosis not present

## 2020-11-14 DIAGNOSIS — F329 Major depressive disorder, single episode, unspecified: Secondary | ICD-10-CM | POA: Diagnosis not present

## 2020-11-14 DIAGNOSIS — N319 Neuromuscular dysfunction of bladder, unspecified: Secondary | ICD-10-CM | POA: Diagnosis not present

## 2020-11-14 DIAGNOSIS — G931 Anoxic brain damage, not elsewhere classified: Secondary | ICD-10-CM | POA: Diagnosis not present

## 2020-11-14 DIAGNOSIS — Z09 Encounter for follow-up examination after completed treatment for conditions other than malignant neoplasm: Secondary | ICD-10-CM | POA: Diagnosis not present

## 2020-11-14 DIAGNOSIS — Z931 Gastrostomy status: Secondary | ICD-10-CM | POA: Diagnosis not present

## 2020-11-14 DIAGNOSIS — J9621 Acute and chronic respiratory failure with hypoxia: Secondary | ICD-10-CM | POA: Diagnosis not present

## 2020-11-14 DIAGNOSIS — Z9911 Dependence on respirator [ventilator] status: Secondary | ICD-10-CM | POA: Diagnosis not present

## 2020-11-14 DIAGNOSIS — J9611 Chronic respiratory failure with hypoxia: Secondary | ICD-10-CM | POA: Diagnosis not present

## 2020-11-14 DIAGNOSIS — R21 Rash and other nonspecific skin eruption: Secondary | ICD-10-CM | POA: Diagnosis not present

## 2020-11-14 DIAGNOSIS — J9801 Acute bronchospasm: Secondary | ICD-10-CM | POA: Diagnosis not present

## 2020-11-14 DIAGNOSIS — Z930 Tracheostomy status: Secondary | ICD-10-CM | POA: Diagnosis not present

## 2020-11-14 DIAGNOSIS — E1165 Type 2 diabetes mellitus with hyperglycemia: Secondary | ICD-10-CM | POA: Diagnosis not present

## 2020-11-14 DIAGNOSIS — R0989 Other specified symptoms and signs involving the circulatory and respiratory systems: Secondary | ICD-10-CM | POA: Diagnosis not present

## 2020-11-14 DIAGNOSIS — J449 Chronic obstructive pulmonary disease, unspecified: Secondary | ICD-10-CM | POA: Diagnosis not present

## 2020-11-14 DIAGNOSIS — Z1159 Encounter for screening for other viral diseases: Secondary | ICD-10-CM | POA: Diagnosis not present

## 2020-11-14 DIAGNOSIS — Z4659 Encounter for fitting and adjustment of other gastrointestinal appliance and device: Secondary | ICD-10-CM | POA: Diagnosis not present

## 2020-11-15 DIAGNOSIS — G931 Anoxic brain damage, not elsewhere classified: Secondary | ICD-10-CM | POA: Diagnosis not present

## 2020-11-15 DIAGNOSIS — Z931 Gastrostomy status: Secondary | ICD-10-CM | POA: Diagnosis not present

## 2020-11-15 DIAGNOSIS — L89153 Pressure ulcer of sacral region, stage 3: Secondary | ICD-10-CM | POA: Diagnosis not present

## 2020-11-15 DIAGNOSIS — Z09 Encounter for follow-up examination after completed treatment for conditions other than malignant neoplasm: Secondary | ICD-10-CM | POA: Diagnosis not present

## 2020-11-15 DIAGNOSIS — Z9911 Dependence on respirator [ventilator] status: Secondary | ICD-10-CM | POA: Diagnosis not present

## 2020-11-22 DIAGNOSIS — L89153 Pressure ulcer of sacral region, stage 3: Secondary | ICD-10-CM | POA: Diagnosis not present

## 2020-11-27 DIAGNOSIS — F329 Major depressive disorder, single episode, unspecified: Secondary | ICD-10-CM | POA: Diagnosis not present

## 2020-11-27 DIAGNOSIS — G931 Anoxic brain damage, not elsewhere classified: Secondary | ICD-10-CM | POA: Diagnosis not present

## 2020-11-27 DIAGNOSIS — Z9911 Dependence on respirator [ventilator] status: Secondary | ICD-10-CM | POA: Diagnosis not present

## 2020-11-27 DIAGNOSIS — E1165 Type 2 diabetes mellitus with hyperglycemia: Secondary | ICD-10-CM | POA: Diagnosis not present

## 2020-11-29 DIAGNOSIS — L89153 Pressure ulcer of sacral region, stage 3: Secondary | ICD-10-CM | POA: Diagnosis not present

## 2020-11-30 DIAGNOSIS — R0989 Other specified symptoms and signs involving the circulatory and respiratory systems: Secondary | ICD-10-CM | POA: Diagnosis not present

## 2020-11-30 DIAGNOSIS — Z1159 Encounter for screening for other viral diseases: Secondary | ICD-10-CM | POA: Diagnosis not present

## 2020-11-30 DIAGNOSIS — J9801 Acute bronchospasm: Secondary | ICD-10-CM | POA: Diagnosis not present

## 2020-11-30 DIAGNOSIS — Z9911 Dependence on respirator [ventilator] status: Secondary | ICD-10-CM | POA: Diagnosis not present

## 2020-11-30 DIAGNOSIS — J9621 Acute and chronic respiratory failure with hypoxia: Secondary | ICD-10-CM | POA: Diagnosis not present

## 2020-11-30 DIAGNOSIS — J449 Chronic obstructive pulmonary disease, unspecified: Secondary | ICD-10-CM | POA: Diagnosis not present

## 2020-12-05 DIAGNOSIS — R21 Rash and other nonspecific skin eruption: Secondary | ICD-10-CM | POA: Diagnosis not present

## 2020-12-06 DIAGNOSIS — L89153 Pressure ulcer of sacral region, stage 3: Secondary | ICD-10-CM | POA: Diagnosis not present

## 2020-12-07 DIAGNOSIS — B86 Scabies: Secondary | ICD-10-CM | POA: Diagnosis not present

## 2020-12-10 DIAGNOSIS — L89153 Pressure ulcer of sacral region, stage 3: Secondary | ICD-10-CM | POA: Diagnosis not present

## 2020-12-10 DIAGNOSIS — Z930 Tracheostomy status: Secondary | ICD-10-CM | POA: Diagnosis not present

## 2020-12-10 DIAGNOSIS — N319 Neuromuscular dysfunction of bladder, unspecified: Secondary | ICD-10-CM | POA: Diagnosis not present

## 2020-12-11 DIAGNOSIS — G931 Anoxic brain damage, not elsewhere classified: Secondary | ICD-10-CM | POA: Diagnosis not present

## 2020-12-11 DIAGNOSIS — Z9911 Dependence on respirator [ventilator] status: Secondary | ICD-10-CM | POA: Diagnosis not present

## 2020-12-11 DIAGNOSIS — J9611 Chronic respiratory failure with hypoxia: Secondary | ICD-10-CM | POA: Diagnosis not present

## 2020-12-13 DIAGNOSIS — L89153 Pressure ulcer of sacral region, stage 3: Secondary | ICD-10-CM | POA: Diagnosis not present

## 2020-12-14 DIAGNOSIS — E1169 Type 2 diabetes mellitus with other specified complication: Secondary | ICD-10-CM | POA: Diagnosis not present

## 2020-12-14 DIAGNOSIS — N319 Neuromuscular dysfunction of bladder, unspecified: Secondary | ICD-10-CM | POA: Diagnosis not present

## 2020-12-14 DIAGNOSIS — J9611 Chronic respiratory failure with hypoxia: Secondary | ICD-10-CM | POA: Diagnosis not present

## 2020-12-14 DIAGNOSIS — I130 Hypertensive heart and chronic kidney disease with heart failure and stage 1 through stage 4 chronic kidney disease, or unspecified chronic kidney disease: Secondary | ICD-10-CM | POA: Diagnosis not present

## 2020-12-14 DIAGNOSIS — R403 Persistent vegetative state: Secondary | ICD-10-CM | POA: Diagnosis not present

## 2020-12-14 DIAGNOSIS — Z9911 Dependence on respirator [ventilator] status: Secondary | ICD-10-CM | POA: Diagnosis not present

## 2020-12-14 DIAGNOSIS — J449 Chronic obstructive pulmonary disease, unspecified: Secondary | ICD-10-CM | POA: Diagnosis not present

## 2020-12-14 DIAGNOSIS — Z4659 Encounter for fitting and adjustment of other gastrointestinal appliance and device: Secondary | ICD-10-CM | POA: Diagnosis not present

## 2020-12-14 DIAGNOSIS — R404 Transient alteration of awareness: Secondary | ICD-10-CM | POA: Diagnosis not present

## 2020-12-14 DIAGNOSIS — J9610 Chronic respiratory failure, unspecified whether with hypoxia or hypercapnia: Secondary | ICD-10-CM | POA: Diagnosis not present

## 2020-12-14 DIAGNOSIS — Z930 Tracheostomy status: Secondary | ICD-10-CM | POA: Diagnosis not present

## 2020-12-14 DIAGNOSIS — L89153 Pressure ulcer of sacral region, stage 3: Secondary | ICD-10-CM | POA: Diagnosis not present

## 2020-12-14 DIAGNOSIS — Z8673 Personal history of transient ischemic attack (TIA), and cerebral infarction without residual deficits: Secondary | ICD-10-CM | POA: Diagnosis not present

## 2020-12-14 DIAGNOSIS — K9423 Gastrostomy malfunction: Secondary | ICD-10-CM | POA: Diagnosis not present

## 2020-12-14 DIAGNOSIS — Z431 Encounter for attention to gastrostomy: Secondary | ICD-10-CM | POA: Diagnosis not present

## 2020-12-14 DIAGNOSIS — N184 Chronic kidney disease, stage 4 (severe): Secondary | ICD-10-CM | POA: Diagnosis not present

## 2020-12-14 DIAGNOSIS — J9621 Acute and chronic respiratory failure with hypoxia: Secondary | ICD-10-CM | POA: Diagnosis not present

## 2020-12-14 DIAGNOSIS — N189 Chronic kidney disease, unspecified: Secondary | ICD-10-CM | POA: Diagnosis not present

## 2020-12-14 DIAGNOSIS — E1122 Type 2 diabetes mellitus with diabetic chronic kidney disease: Secondary | ICD-10-CM | POA: Diagnosis not present

## 2020-12-14 DIAGNOSIS — G931 Anoxic brain damage, not elsewhere classified: Secondary | ICD-10-CM | POA: Diagnosis not present

## 2020-12-14 DIAGNOSIS — G4733 Obstructive sleep apnea (adult) (pediatric): Secondary | ICD-10-CM | POA: Diagnosis not present

## 2020-12-14 DIAGNOSIS — Z8674 Personal history of sudden cardiac arrest: Secondary | ICD-10-CM | POA: Diagnosis not present

## 2021-01-04 DIAGNOSIS — Z4659 Encounter for fitting and adjustment of other gastrointestinal appliance and device: Secondary | ICD-10-CM | POA: Diagnosis not present

## 2021-01-04 DIAGNOSIS — G4733 Obstructive sleep apnea (adult) (pediatric): Secondary | ICD-10-CM | POA: Diagnosis not present

## 2021-01-04 DIAGNOSIS — Z431 Encounter for attention to gastrostomy: Secondary | ICD-10-CM | POA: Diagnosis not present

## 2021-01-04 DIAGNOSIS — N189 Chronic kidney disease, unspecified: Secondary | ICD-10-CM | POA: Diagnosis not present

## 2021-01-04 DIAGNOSIS — J449 Chronic obstructive pulmonary disease, unspecified: Secondary | ICD-10-CM | POA: Diagnosis not present

## 2021-01-04 DIAGNOSIS — E1122 Type 2 diabetes mellitus with diabetic chronic kidney disease: Secondary | ICD-10-CM | POA: Diagnosis not present

## 2021-01-04 DIAGNOSIS — I130 Hypertensive heart and chronic kidney disease with heart failure and stage 1 through stage 4 chronic kidney disease, or unspecified chronic kidney disease: Secondary | ICD-10-CM | POA: Diagnosis not present

## 2021-01-04 DIAGNOSIS — K9423 Gastrostomy malfunction: Secondary | ICD-10-CM | POA: Diagnosis not present

## 2021-01-04 DIAGNOSIS — Z8673 Personal history of transient ischemic attack (TIA), and cerebral infarction without residual deficits: Secondary | ICD-10-CM | POA: Diagnosis not present

## 2021-01-04 DIAGNOSIS — R404 Transient alteration of awareness: Secondary | ICD-10-CM | POA: Diagnosis not present

## 2021-01-04 DIAGNOSIS — Z8674 Personal history of sudden cardiac arrest: Secondary | ICD-10-CM | POA: Diagnosis not present

## 2021-01-07 DIAGNOSIS — Z930 Tracheostomy status: Secondary | ICD-10-CM | POA: Diagnosis not present

## 2021-01-07 DIAGNOSIS — E1169 Type 2 diabetes mellitus with other specified complication: Secondary | ICD-10-CM | POA: Diagnosis not present

## 2021-01-07 DIAGNOSIS — J9610 Chronic respiratory failure, unspecified whether with hypoxia or hypercapnia: Secondary | ICD-10-CM | POA: Diagnosis not present

## 2021-01-07 DIAGNOSIS — G931 Anoxic brain damage, not elsewhere classified: Secondary | ICD-10-CM | POA: Diagnosis not present

## 2021-01-07 DIAGNOSIS — Z9911 Dependence on respirator [ventilator] status: Secondary | ICD-10-CM | POA: Diagnosis not present

## 2021-01-07 DIAGNOSIS — L89153 Pressure ulcer of sacral region, stage 3: Secondary | ICD-10-CM | POA: Diagnosis not present

## 2021-01-07 DIAGNOSIS — N319 Neuromuscular dysfunction of bladder, unspecified: Secondary | ICD-10-CM | POA: Diagnosis not present

## 2021-01-10 DIAGNOSIS — J9611 Chronic respiratory failure with hypoxia: Secondary | ICD-10-CM | POA: Diagnosis not present

## 2021-01-10 DIAGNOSIS — G931 Anoxic brain damage, not elsewhere classified: Secondary | ICD-10-CM | POA: Diagnosis not present

## 2021-01-10 DIAGNOSIS — Z9911 Dependence on respirator [ventilator] status: Secondary | ICD-10-CM | POA: Diagnosis not present

## 2021-01-16 DIAGNOSIS — E119 Type 2 diabetes mellitus without complications: Secondary | ICD-10-CM | POA: Diagnosis not present

## 2021-02-01 DIAGNOSIS — Z930 Tracheostomy status: Secondary | ICD-10-CM | POA: Diagnosis not present

## 2021-02-02 DIAGNOSIS — R404 Transient alteration of awareness: Secondary | ICD-10-CM | POA: Diagnosis not present

## 2021-02-02 DIAGNOSIS — I10 Essential (primary) hypertension: Secondary | ICD-10-CM | POA: Diagnosis not present

## 2021-02-03 DIAGNOSIS — E1122 Type 2 diabetes mellitus with diabetic chronic kidney disease: Secondary | ICD-10-CM | POA: Diagnosis not present

## 2021-02-03 DIAGNOSIS — I130 Hypertensive heart and chronic kidney disease with heart failure and stage 1 through stage 4 chronic kidney disease, or unspecified chronic kidney disease: Secondary | ICD-10-CM | POA: Diagnosis not present

## 2021-02-03 DIAGNOSIS — Z931 Gastrostomy status: Secondary | ICD-10-CM | POA: Diagnosis not present

## 2021-02-03 DIAGNOSIS — Z8674 Personal history of sudden cardiac arrest: Secondary | ICD-10-CM | POA: Diagnosis not present

## 2021-02-03 DIAGNOSIS — J449 Chronic obstructive pulmonary disease, unspecified: Secondary | ICD-10-CM | POA: Diagnosis not present

## 2021-02-03 DIAGNOSIS — Z4659 Encounter for fitting and adjustment of other gastrointestinal appliance and device: Secondary | ICD-10-CM | POA: Diagnosis not present

## 2021-02-03 DIAGNOSIS — R404 Transient alteration of awareness: Secondary | ICD-10-CM | POA: Diagnosis not present

## 2021-02-03 DIAGNOSIS — K9423 Gastrostomy malfunction: Secondary | ICD-10-CM | POA: Diagnosis not present

## 2021-02-03 DIAGNOSIS — Z794 Long term (current) use of insulin: Secondary | ICD-10-CM | POA: Diagnosis not present

## 2021-02-03 DIAGNOSIS — R6889 Other general symptoms and signs: Secondary | ICD-10-CM | POA: Diagnosis not present

## 2021-02-03 DIAGNOSIS — R140 Abdominal distension (gaseous): Secondary | ICD-10-CM | POA: Diagnosis not present

## 2021-02-03 DIAGNOSIS — N189 Chronic kidney disease, unspecified: Secondary | ICD-10-CM | POA: Diagnosis not present

## 2021-02-04 DIAGNOSIS — N319 Neuromuscular dysfunction of bladder, unspecified: Secondary | ICD-10-CM | POA: Diagnosis not present

## 2021-02-11 DIAGNOSIS — I10 Essential (primary) hypertension: Secondary | ICD-10-CM | POA: Diagnosis not present

## 2021-02-11 DIAGNOSIS — Z9911 Dependence on respirator [ventilator] status: Secondary | ICD-10-CM | POA: Diagnosis not present

## 2021-02-11 DIAGNOSIS — J9610 Chronic respiratory failure, unspecified whether with hypoxia or hypercapnia: Secondary | ICD-10-CM | POA: Diagnosis not present

## 2021-02-11 DIAGNOSIS — E1169 Type 2 diabetes mellitus with other specified complication: Secondary | ICD-10-CM | POA: Diagnosis not present

## 2021-02-12 DIAGNOSIS — G931 Anoxic brain damage, not elsewhere classified: Secondary | ICD-10-CM | POA: Diagnosis not present

## 2021-02-12 DIAGNOSIS — J9611 Chronic respiratory failure with hypoxia: Secondary | ICD-10-CM | POA: Diagnosis not present

## 2021-02-12 DIAGNOSIS — Z9911 Dependence on respirator [ventilator] status: Secondary | ICD-10-CM | POA: Diagnosis not present

## 2021-03-04 DIAGNOSIS — Z930 Tracheostomy status: Secondary | ICD-10-CM | POA: Diagnosis not present

## 2021-03-04 DIAGNOSIS — N319 Neuromuscular dysfunction of bladder, unspecified: Secondary | ICD-10-CM | POA: Diagnosis not present

## 2021-03-06 DIAGNOSIS — S20429A Blister (nonthermal) of unspecified back wall of thorax, initial encounter: Secondary | ICD-10-CM | POA: Diagnosis not present

## 2021-03-08 DIAGNOSIS — J9611 Chronic respiratory failure with hypoxia: Secondary | ICD-10-CM | POA: Diagnosis not present

## 2021-03-08 DIAGNOSIS — Z9911 Dependence on respirator [ventilator] status: Secondary | ICD-10-CM | POA: Diagnosis not present

## 2021-03-08 DIAGNOSIS — G931 Anoxic brain damage, not elsewhere classified: Secondary | ICD-10-CM | POA: Diagnosis not present

## 2021-03-19 DIAGNOSIS — J9610 Chronic respiratory failure, unspecified whether with hypoxia or hypercapnia: Secondary | ICD-10-CM | POA: Diagnosis not present

## 2021-03-19 DIAGNOSIS — G931 Anoxic brain damage, not elsewhere classified: Secondary | ICD-10-CM | POA: Diagnosis not present

## 2021-03-19 DIAGNOSIS — E1169 Type 2 diabetes mellitus with other specified complication: Secondary | ICD-10-CM | POA: Diagnosis not present

## 2021-03-19 DIAGNOSIS — N189 Chronic kidney disease, unspecified: Secondary | ICD-10-CM | POA: Diagnosis not present

## 2021-04-01 DIAGNOSIS — Z930 Tracheostomy status: Secondary | ICD-10-CM | POA: Diagnosis not present

## 2021-04-01 DIAGNOSIS — N319 Neuromuscular dysfunction of bladder, unspecified: Secondary | ICD-10-CM | POA: Diagnosis not present

## 2021-04-12 DIAGNOSIS — G931 Anoxic brain damage, not elsewhere classified: Secondary | ICD-10-CM | POA: Diagnosis not present

## 2021-04-12 DIAGNOSIS — J9611 Chronic respiratory failure with hypoxia: Secondary | ICD-10-CM | POA: Diagnosis not present

## 2021-04-12 DIAGNOSIS — Z9911 Dependence on respirator [ventilator] status: Secondary | ICD-10-CM | POA: Diagnosis not present

## 2021-04-29 DIAGNOSIS — N319 Neuromuscular dysfunction of bladder, unspecified: Secondary | ICD-10-CM | POA: Diagnosis not present

## 2021-04-29 DIAGNOSIS — Z930 Tracheostomy status: Secondary | ICD-10-CM | POA: Diagnosis not present

## 2021-04-30 DIAGNOSIS — Z79899 Other long term (current) drug therapy: Secondary | ICD-10-CM | POA: Diagnosis not present

## 2021-04-30 DIAGNOSIS — E119 Type 2 diabetes mellitus without complications: Secondary | ICD-10-CM | POA: Diagnosis not present

## 2021-05-01 DIAGNOSIS — D72829 Elevated white blood cell count, unspecified: Secondary | ICD-10-CM | POA: Diagnosis not present

## 2021-05-01 DIAGNOSIS — R8290 Unspecified abnormal findings in urine: Secondary | ICD-10-CM | POA: Diagnosis not present

## 2021-05-01 DIAGNOSIS — Z87440 Personal history of urinary (tract) infections: Secondary | ICD-10-CM | POA: Diagnosis not present

## 2021-05-01 DIAGNOSIS — R319 Hematuria, unspecified: Secondary | ICD-10-CM | POA: Diagnosis not present

## 2021-05-01 DIAGNOSIS — R6889 Other general symptoms and signs: Secondary | ICD-10-CM | POA: Diagnosis not present

## 2021-05-03 DIAGNOSIS — J9611 Chronic respiratory failure with hypoxia: Secondary | ICD-10-CM | POA: Diagnosis not present

## 2021-05-03 DIAGNOSIS — G931 Anoxic brain damage, not elsewhere classified: Secondary | ICD-10-CM | POA: Diagnosis not present

## 2021-05-03 DIAGNOSIS — Z9911 Dependence on respirator [ventilator] status: Secondary | ICD-10-CM | POA: Diagnosis not present

## 2021-05-24 DIAGNOSIS — Z930 Tracheostomy status: Secondary | ICD-10-CM | POA: Diagnosis not present

## 2021-05-24 DIAGNOSIS — N319 Neuromuscular dysfunction of bladder, unspecified: Secondary | ICD-10-CM | POA: Diagnosis not present

## 2021-05-27 DIAGNOSIS — J9610 Chronic respiratory failure, unspecified whether with hypoxia or hypercapnia: Secondary | ICD-10-CM | POA: Diagnosis not present

## 2021-05-27 DIAGNOSIS — Z20828 Contact with and (suspected) exposure to other viral communicable diseases: Secondary | ICD-10-CM | POA: Diagnosis not present

## 2021-05-27 DIAGNOSIS — Z9911 Dependence on respirator [ventilator] status: Secondary | ICD-10-CM | POA: Diagnosis not present

## 2021-06-02 DIAGNOSIS — G931 Anoxic brain damage, not elsewhere classified: Secondary | ICD-10-CM | POA: Diagnosis not present

## 2021-06-02 DIAGNOSIS — J9611 Chronic respiratory failure with hypoxia: Secondary | ICD-10-CM | POA: Diagnosis not present

## 2021-06-02 DIAGNOSIS — Z9911 Dependence on respirator [ventilator] status: Secondary | ICD-10-CM | POA: Diagnosis not present

## 2021-06-05 DIAGNOSIS — G931 Anoxic brain damage, not elsewhere classified: Secondary | ICD-10-CM | POA: Diagnosis not present

## 2021-06-05 DIAGNOSIS — E1169 Type 2 diabetes mellitus with other specified complication: Secondary | ICD-10-CM | POA: Diagnosis not present

## 2021-06-05 DIAGNOSIS — J9610 Chronic respiratory failure, unspecified whether with hypoxia or hypercapnia: Secondary | ICD-10-CM | POA: Diagnosis not present

## 2021-06-05 DIAGNOSIS — Z9911 Dependence on respirator [ventilator] status: Secondary | ICD-10-CM | POA: Diagnosis not present

## 2021-06-24 DIAGNOSIS — Z930 Tracheostomy status: Secondary | ICD-10-CM | POA: Diagnosis not present

## 2021-06-24 DIAGNOSIS — N319 Neuromuscular dysfunction of bladder, unspecified: Secondary | ICD-10-CM | POA: Diagnosis not present

## 2021-07-03 DIAGNOSIS — S20429A Blister (nonthermal) of unspecified back wall of thorax, initial encounter: Secondary | ICD-10-CM | POA: Diagnosis not present

## 2021-07-12 DIAGNOSIS — G931 Anoxic brain damage, not elsewhere classified: Secondary | ICD-10-CM | POA: Diagnosis not present

## 2021-07-12 DIAGNOSIS — Z9911 Dependence on respirator [ventilator] status: Secondary | ICD-10-CM | POA: Diagnosis not present

## 2021-07-12 DIAGNOSIS — J9611 Chronic respiratory failure with hypoxia: Secondary | ICD-10-CM | POA: Diagnosis not present

## 2021-07-17 DIAGNOSIS — E119 Type 2 diabetes mellitus without complications: Secondary | ICD-10-CM | POA: Diagnosis not present

## 2021-07-22 DIAGNOSIS — Z930 Tracheostomy status: Secondary | ICD-10-CM | POA: Diagnosis not present

## 2021-07-22 DIAGNOSIS — N319 Neuromuscular dysfunction of bladder, unspecified: Secondary | ICD-10-CM | POA: Diagnosis not present

## 2021-07-23 DIAGNOSIS — N189 Chronic kidney disease, unspecified: Secondary | ICD-10-CM | POA: Diagnosis not present

## 2021-07-23 DIAGNOSIS — Z9911 Dependence on respirator [ventilator] status: Secondary | ICD-10-CM | POA: Diagnosis not present

## 2021-07-23 DIAGNOSIS — E1165 Type 2 diabetes mellitus with hyperglycemia: Secondary | ICD-10-CM | POA: Diagnosis not present

## 2021-07-23 DIAGNOSIS — G931 Anoxic brain damage, not elsewhere classified: Secondary | ICD-10-CM | POA: Diagnosis not present

## 2021-07-31 DIAGNOSIS — Z9911 Dependence on respirator [ventilator] status: Secondary | ICD-10-CM | POA: Diagnosis not present

## 2021-07-31 DIAGNOSIS — Z79899 Other long term (current) drug therapy: Secondary | ICD-10-CM | POA: Diagnosis not present

## 2021-07-31 DIAGNOSIS — J9621 Acute and chronic respiratory failure with hypoxia: Secondary | ICD-10-CM | POA: Diagnosis not present

## 2021-07-31 DIAGNOSIS — R0603 Acute respiratory distress: Secondary | ICD-10-CM | POA: Diagnosis not present

## 2021-07-31 DIAGNOSIS — R0902 Hypoxemia: Secondary | ICD-10-CM | POA: Diagnosis not present

## 2021-07-31 DIAGNOSIS — R000 Tachycardia, unspecified: Secondary | ICD-10-CM | POA: Diagnosis not present

## 2021-07-31 DIAGNOSIS — R093 Abnormal sputum: Secondary | ICD-10-CM | POA: Diagnosis not present

## 2021-08-02 DIAGNOSIS — J9611 Chronic respiratory failure with hypoxia: Secondary | ICD-10-CM | POA: Diagnosis not present

## 2021-08-02 DIAGNOSIS — J9610 Chronic respiratory failure, unspecified whether with hypoxia or hypercapnia: Secondary | ICD-10-CM | POA: Diagnosis not present

## 2021-08-02 DIAGNOSIS — J95851 Ventilator associated pneumonia: Secondary | ICD-10-CM | POA: Diagnosis not present

## 2021-08-02 DIAGNOSIS — G931 Anoxic brain damage, not elsewhere classified: Secondary | ICD-10-CM | POA: Diagnosis not present

## 2021-08-02 DIAGNOSIS — Z9911 Dependence on respirator [ventilator] status: Secondary | ICD-10-CM | POA: Diagnosis not present

## 2021-08-02 DIAGNOSIS — D72829 Elevated white blood cell count, unspecified: Secondary | ICD-10-CM | POA: Diagnosis not present

## 2021-08-20 DIAGNOSIS — Z79899 Other long term (current) drug therapy: Secondary | ICD-10-CM | POA: Diagnosis not present

## 2021-08-20 DIAGNOSIS — D649 Anemia, unspecified: Secondary | ICD-10-CM | POA: Diagnosis not present

## 2021-08-20 DIAGNOSIS — R739 Hyperglycemia, unspecified: Secondary | ICD-10-CM | POA: Diagnosis not present

## 2021-08-20 DIAGNOSIS — E1169 Type 2 diabetes mellitus with other specified complication: Secondary | ICD-10-CM | POA: Diagnosis not present

## 2021-08-21 DIAGNOSIS — E119 Type 2 diabetes mellitus without complications: Secondary | ICD-10-CM | POA: Diagnosis not present

## 2021-08-21 DIAGNOSIS — Z794 Long term (current) use of insulin: Secondary | ICD-10-CM | POA: Diagnosis not present

## 2021-08-26 DIAGNOSIS — N319 Neuromuscular dysfunction of bladder, unspecified: Secondary | ICD-10-CM | POA: Diagnosis not present

## 2021-09-13 DIAGNOSIS — Z9911 Dependence on respirator [ventilator] status: Secondary | ICD-10-CM | POA: Diagnosis not present

## 2021-09-13 DIAGNOSIS — G9349 Other encephalopathy: Secondary | ICD-10-CM | POA: Diagnosis not present

## 2021-09-13 DIAGNOSIS — J9611 Chronic respiratory failure with hypoxia: Secondary | ICD-10-CM | POA: Diagnosis not present

## 2021-09-18 DIAGNOSIS — E1169 Type 2 diabetes mellitus with other specified complication: Secondary | ICD-10-CM | POA: Diagnosis not present

## 2021-09-18 DIAGNOSIS — J9610 Chronic respiratory failure, unspecified whether with hypoxia or hypercapnia: Secondary | ICD-10-CM | POA: Diagnosis not present

## 2021-09-18 DIAGNOSIS — Z9911 Dependence on respirator [ventilator] status: Secondary | ICD-10-CM | POA: Diagnosis not present

## 2021-09-25 DIAGNOSIS — N319 Neuromuscular dysfunction of bladder, unspecified: Secondary | ICD-10-CM | POA: Diagnosis not present

## 2021-09-25 DIAGNOSIS — Z930 Tracheostomy status: Secondary | ICD-10-CM | POA: Diagnosis not present

## 2021-10-15 DIAGNOSIS — E119 Type 2 diabetes mellitus without complications: Secondary | ICD-10-CM | POA: Diagnosis not present

## 2021-10-15 DIAGNOSIS — Z79899 Other long term (current) drug therapy: Secondary | ICD-10-CM | POA: Diagnosis not present

## 2021-10-18 DIAGNOSIS — E1169 Type 2 diabetes mellitus with other specified complication: Secondary | ICD-10-CM | POA: Diagnosis not present

## 2021-10-18 DIAGNOSIS — D649 Anemia, unspecified: Secondary | ICD-10-CM | POA: Diagnosis not present

## 2021-10-18 DIAGNOSIS — M109 Gout, unspecified: Secondary | ICD-10-CM | POA: Diagnosis not present

## 2021-10-18 DIAGNOSIS — J9610 Chronic respiratory failure, unspecified whether with hypoxia or hypercapnia: Secondary | ICD-10-CM | POA: Diagnosis not present

## 2021-10-21 DIAGNOSIS — N319 Neuromuscular dysfunction of bladder, unspecified: Secondary | ICD-10-CM | POA: Diagnosis not present

## 2021-10-21 DIAGNOSIS — J9610 Chronic respiratory failure, unspecified whether with hypoxia or hypercapnia: Secondary | ICD-10-CM | POA: Diagnosis not present

## 2021-10-21 DIAGNOSIS — R634 Abnormal weight loss: Secondary | ICD-10-CM | POA: Diagnosis not present

## 2021-10-21 DIAGNOSIS — Z9911 Dependence on respirator [ventilator] status: Secondary | ICD-10-CM | POA: Diagnosis not present

## 2021-10-21 DIAGNOSIS — Z930 Tracheostomy status: Secondary | ICD-10-CM | POA: Diagnosis not present

## 2021-10-29 DIAGNOSIS — Z930 Tracheostomy status: Secondary | ICD-10-CM | POA: Diagnosis not present

## 2021-11-04 DIAGNOSIS — G9349 Other encephalopathy: Secondary | ICD-10-CM | POA: Diagnosis not present

## 2021-11-04 DIAGNOSIS — Z9911 Dependence on respirator [ventilator] status: Secondary | ICD-10-CM | POA: Diagnosis not present

## 2021-11-04 DIAGNOSIS — J9611 Chronic respiratory failure with hypoxia: Secondary | ICD-10-CM | POA: Diagnosis not present

## 2021-11-15 DIAGNOSIS — E1169 Type 2 diabetes mellitus with other specified complication: Secondary | ICD-10-CM | POA: Diagnosis not present

## 2021-11-15 DIAGNOSIS — Z9911 Dependence on respirator [ventilator] status: Secondary | ICD-10-CM | POA: Diagnosis not present

## 2021-11-15 DIAGNOSIS — J9610 Chronic respiratory failure, unspecified whether with hypoxia or hypercapnia: Secondary | ICD-10-CM | POA: Diagnosis not present

## 2021-11-18 DIAGNOSIS — Z930 Tracheostomy status: Secondary | ICD-10-CM | POA: Diagnosis not present

## 2021-11-18 DIAGNOSIS — N319 Neuromuscular dysfunction of bladder, unspecified: Secondary | ICD-10-CM | POA: Diagnosis not present

## 2021-11-26 DIAGNOSIS — E1169 Type 2 diabetes mellitus with other specified complication: Secondary | ICD-10-CM | POA: Diagnosis not present

## 2021-11-26 DIAGNOSIS — G931 Anoxic brain damage, not elsewhere classified: Secondary | ICD-10-CM | POA: Diagnosis not present

## 2021-11-26 DIAGNOSIS — Z9911 Dependence on respirator [ventilator] status: Secondary | ICD-10-CM | POA: Diagnosis not present

## 2021-11-26 DIAGNOSIS — I469 Cardiac arrest, cause unspecified: Secondary | ICD-10-CM | POA: Diagnosis not present

## 2021-12-13 DIAGNOSIS — Z930 Tracheostomy status: Secondary | ICD-10-CM | POA: Diagnosis not present

## 2021-12-13 DIAGNOSIS — N319 Neuromuscular dysfunction of bladder, unspecified: Secondary | ICD-10-CM | POA: Diagnosis not present

## 2022-01-04 DIAGNOSIS — Z9911 Dependence on respirator [ventilator] status: Secondary | ICD-10-CM | POA: Diagnosis not present

## 2022-01-04 DIAGNOSIS — J9611 Chronic respiratory failure with hypoxia: Secondary | ICD-10-CM | POA: Diagnosis not present

## 2022-01-04 DIAGNOSIS — G931 Anoxic brain damage, not elsewhere classified: Secondary | ICD-10-CM | POA: Diagnosis not present

## 2022-01-14 DIAGNOSIS — E119 Type 2 diabetes mellitus without complications: Secondary | ICD-10-CM | POA: Diagnosis not present

## 2022-01-16 DIAGNOSIS — E1169 Type 2 diabetes mellitus with other specified complication: Secondary | ICD-10-CM | POA: Diagnosis not present

## 2022-01-16 DIAGNOSIS — Z9911 Dependence on respirator [ventilator] status: Secondary | ICD-10-CM | POA: Diagnosis not present

## 2022-01-16 DIAGNOSIS — J9610 Chronic respiratory failure, unspecified whether with hypoxia or hypercapnia: Secondary | ICD-10-CM | POA: Diagnosis not present

## 2022-01-23 DIAGNOSIS — N319 Neuromuscular dysfunction of bladder, unspecified: Secondary | ICD-10-CM | POA: Diagnosis not present

## 2022-02-21 DIAGNOSIS — Z9911 Dependence on respirator [ventilator] status: Secondary | ICD-10-CM | POA: Diagnosis not present

## 2022-02-21 DIAGNOSIS — J9610 Chronic respiratory failure, unspecified whether with hypoxia or hypercapnia: Secondary | ICD-10-CM | POA: Diagnosis not present

## 2022-02-21 DIAGNOSIS — E1169 Type 2 diabetes mellitus with other specified complication: Secondary | ICD-10-CM | POA: Diagnosis not present

## 2022-02-21 DIAGNOSIS — N939 Abnormal uterine and vaginal bleeding, unspecified: Secondary | ICD-10-CM | POA: Diagnosis not present

## 2022-02-28 DIAGNOSIS — J9610 Chronic respiratory failure, unspecified whether with hypoxia or hypercapnia: Secondary | ICD-10-CM | POA: Diagnosis not present

## 2022-02-28 DIAGNOSIS — Z9911 Dependence on respirator [ventilator] status: Secondary | ICD-10-CM | POA: Diagnosis not present

## 2022-02-28 DIAGNOSIS — R403 Persistent vegetative state: Secondary | ICD-10-CM | POA: Diagnosis not present

## 2022-03-11 DIAGNOSIS — T83098D Other mechanical complication of other indwelling urethral catheter, subsequent encounter: Secondary | ICD-10-CM | POA: Diagnosis not present

## 2022-03-11 DIAGNOSIS — J9500 Unspecified tracheostomy complication: Secondary | ICD-10-CM | POA: Diagnosis not present

## 2022-03-14 DIAGNOSIS — G931 Anoxic brain damage, not elsewhere classified: Secondary | ICD-10-CM | POA: Diagnosis not present

## 2022-03-14 DIAGNOSIS — J9611 Chronic respiratory failure with hypoxia: Secondary | ICD-10-CM | POA: Diagnosis not present

## 2022-03-14 DIAGNOSIS — Z9911 Dependence on respirator [ventilator] status: Secondary | ICD-10-CM | POA: Diagnosis not present

## 2022-03-25 DIAGNOSIS — E1169 Type 2 diabetes mellitus with other specified complication: Secondary | ICD-10-CM | POA: Diagnosis not present

## 2022-03-25 DIAGNOSIS — Z9911 Dependence on respirator [ventilator] status: Secondary | ICD-10-CM | POA: Diagnosis not present

## 2022-03-25 DIAGNOSIS — G931 Anoxic brain damage, not elsewhere classified: Secondary | ICD-10-CM | POA: Diagnosis not present

## 2022-03-28 DIAGNOSIS — R093 Abnormal sputum: Secondary | ICD-10-CM | POA: Diagnosis not present

## 2022-03-28 DIAGNOSIS — R739 Hyperglycemia, unspecified: Secondary | ICD-10-CM | POA: Diagnosis not present

## 2022-03-28 DIAGNOSIS — R259 Unspecified abnormal involuntary movements: Secondary | ICD-10-CM | POA: Diagnosis not present

## 2022-03-28 DIAGNOSIS — J9610 Chronic respiratory failure, unspecified whether with hypoxia or hypercapnia: Secondary | ICD-10-CM | POA: Diagnosis not present

## 2022-03-28 DIAGNOSIS — R319 Hematuria, unspecified: Secondary | ICD-10-CM | POA: Diagnosis not present

## 2022-03-28 DIAGNOSIS — R0902 Hypoxemia: Secondary | ICD-10-CM | POA: Diagnosis not present

## 2022-03-28 DIAGNOSIS — R000 Tachycardia, unspecified: Secondary | ICD-10-CM | POA: Diagnosis not present

## 2022-03-28 DIAGNOSIS — J9691 Respiratory failure, unspecified with hypoxia: Secondary | ICD-10-CM | POA: Diagnosis not present

## 2022-03-28 DIAGNOSIS — E1169 Type 2 diabetes mellitus with other specified complication: Secondary | ICD-10-CM | POA: Diagnosis not present

## 2022-03-28 DIAGNOSIS — Z79899 Other long term (current) drug therapy: Secondary | ICD-10-CM | POA: Diagnosis not present

## 2022-03-28 DIAGNOSIS — Z9911 Dependence on respirator [ventilator] status: Secondary | ICD-10-CM | POA: Diagnosis not present

## 2022-03-29 DIAGNOSIS — D72829 Elevated white blood cell count, unspecified: Secondary | ICD-10-CM | POA: Diagnosis not present

## 2022-03-29 DIAGNOSIS — E1169 Type 2 diabetes mellitus with other specified complication: Secondary | ICD-10-CM | POA: Diagnosis not present

## 2022-03-29 DIAGNOSIS — E875 Hyperkalemia: Secondary | ICD-10-CM | POA: Diagnosis not present

## 2022-03-31 DIAGNOSIS — J9610 Chronic respiratory failure, unspecified whether with hypoxia or hypercapnia: Secondary | ICD-10-CM | POA: Diagnosis not present

## 2022-03-31 DIAGNOSIS — J9611 Chronic respiratory failure with hypoxia: Secondary | ICD-10-CM | POA: Diagnosis not present

## 2022-03-31 DIAGNOSIS — E878 Other disorders of electrolyte and fluid balance, not elsewhere classified: Secondary | ICD-10-CM | POA: Diagnosis not present

## 2022-03-31 DIAGNOSIS — E1169 Type 2 diabetes mellitus with other specified complication: Secondary | ICD-10-CM | POA: Diagnosis not present

## 2022-03-31 DIAGNOSIS — R739 Hyperglycemia, unspecified: Secondary | ICD-10-CM | POA: Diagnosis not present

## 2022-03-31 DIAGNOSIS — E876 Hypokalemia: Secondary | ICD-10-CM | POA: Diagnosis not present

## 2022-03-31 DIAGNOSIS — I469 Cardiac arrest, cause unspecified: Secondary | ICD-10-CM | POA: Diagnosis not present

## 2022-03-31 DIAGNOSIS — D649 Anemia, unspecified: Secondary | ICD-10-CM | POA: Diagnosis not present

## 2022-03-31 DIAGNOSIS — Z9911 Dependence on respirator [ventilator] status: Secondary | ICD-10-CM | POA: Diagnosis not present

## 2022-03-31 DIAGNOSIS — N189 Chronic kidney disease, unspecified: Secondary | ICD-10-CM | POA: Diagnosis not present

## 2022-03-31 DIAGNOSIS — G931 Anoxic brain damage, not elsewhere classified: Secondary | ICD-10-CM | POA: Diagnosis not present

## 2022-04-01 DIAGNOSIS — Z79899 Other long term (current) drug therapy: Secondary | ICD-10-CM | POA: Diagnosis not present

## 2022-04-01 DIAGNOSIS — E878 Other disorders of electrolyte and fluid balance, not elsewhere classified: Secondary | ICD-10-CM | POA: Diagnosis not present

## 2022-04-03 DIAGNOSIS — N19 Unspecified kidney failure: Secondary | ICD-10-CM | POA: Diagnosis not present

## 2022-04-03 DIAGNOSIS — Z9911 Dependence on respirator [ventilator] status: Secondary | ICD-10-CM | POA: Diagnosis not present

## 2022-04-03 DIAGNOSIS — G931 Anoxic brain damage, not elsewhere classified: Secondary | ICD-10-CM | POA: Diagnosis not present

## 2022-04-03 DIAGNOSIS — J9610 Chronic respiratory failure, unspecified whether with hypoxia or hypercapnia: Secondary | ICD-10-CM | POA: Diagnosis not present

## 2022-04-16 DEATH — deceased
